# Patient Record
Sex: Female | Born: 1950 | Race: White | Hispanic: No | Marital: Married | Smoking: Current every day smoker
Health system: Southern US, Community
[De-identification: ages and names within clinical notes are randomized; demographics above are authoritative.]

## PROBLEM LIST (undated history)

## (undated) DIAGNOSIS — K7460 Unspecified cirrhosis of liver: Secondary | ICD-10-CM

## (undated) DIAGNOSIS — F419 Anxiety disorder, unspecified: Secondary | ICD-10-CM

## (undated) DIAGNOSIS — N390 Urinary tract infection, site not specified: Secondary | ICD-10-CM

## (undated) DIAGNOSIS — E119 Type 2 diabetes mellitus without complications: Secondary | ICD-10-CM

## (undated) DIAGNOSIS — F329 Major depressive disorder, single episode, unspecified: Secondary | ICD-10-CM

## (undated) DIAGNOSIS — R918 Other nonspecific abnormal finding of lung field: Secondary | ICD-10-CM

## (undated) DIAGNOSIS — M810 Age-related osteoporosis without current pathological fracture: Secondary | ICD-10-CM

## (undated) DIAGNOSIS — Z8781 Personal history of (healed) traumatic fracture: Secondary | ICD-10-CM

## (undated) DIAGNOSIS — I639 Cerebral infarction, unspecified: Secondary | ICD-10-CM

## (undated) DIAGNOSIS — D126 Benign neoplasm of colon, unspecified: Secondary | ICD-10-CM

## (undated) DIAGNOSIS — Z8673 Personal history of transient ischemic attack (TIA), and cerebral infarction without residual deficits: Secondary | ICD-10-CM

## (undated) DIAGNOSIS — E785 Hyperlipidemia, unspecified: Secondary | ICD-10-CM

## (undated) DIAGNOSIS — Z972 Presence of dental prosthetic device (complete) (partial): Secondary | ICD-10-CM

## (undated) DIAGNOSIS — I10 Essential (primary) hypertension: Secondary | ICD-10-CM

## (undated) DIAGNOSIS — M1990 Unspecified osteoarthritis, unspecified site: Secondary | ICD-10-CM

## (undated) DIAGNOSIS — K2970 Gastritis, unspecified, without bleeding: Secondary | ICD-10-CM

## (undated) DIAGNOSIS — I2510 Atherosclerotic heart disease of native coronary artery without angina pectoris: Secondary | ICD-10-CM

## (undated) DIAGNOSIS — F32A Depression, unspecified: Secondary | ICD-10-CM

## (undated) DIAGNOSIS — J449 Chronic obstructive pulmonary disease, unspecified: Secondary | ICD-10-CM

## (undated) DIAGNOSIS — K635 Polyp of colon: Secondary | ICD-10-CM

## (undated) HISTORY — DX: Cerebral infarction, unspecified: I63.9

## (undated) HISTORY — DX: Anxiety disorder, unspecified: F41.9

## (undated) HISTORY — DX: Benign neoplasm of colon, unspecified: D12.6

## (undated) HISTORY — DX: Atherosclerotic heart disease of native coronary artery without angina pectoris: I25.10

## (undated) HISTORY — DX: Polyp of colon: K63.5

## (undated) HISTORY — DX: Age-related osteoporosis without current pathological fracture: M81.0

## (undated) HISTORY — DX: Gastritis, unspecified, without bleeding: K29.70

## (undated) HISTORY — DX: Hyperlipidemia, unspecified: E78.5

## (undated) HISTORY — PX: VAGINAL HYSTERECTOMY: SUR661

## (undated) HISTORY — DX: Unspecified cirrhosis of liver: K74.60

## (undated) HISTORY — DX: Essential (primary) hypertension: I10

---

## 1995-07-28 HISTORY — PX: BILATERAL SALPINGOOPHORECTOMY: SHX1223

## 1998-03-27 ENCOUNTER — Other Ambulatory Visit: Admission: RE | Admit: 1998-03-27 | Discharge: 1998-03-27 | Payer: Self-pay | Admitting: Obstetrics and Gynecology

## 1998-04-02 ENCOUNTER — Ambulatory Visit (HOSPITAL_COMMUNITY): Admission: RE | Admit: 1998-04-02 | Discharge: 1998-04-02 | Payer: Self-pay | Admitting: Obstetrics and Gynecology

## 1998-05-07 ENCOUNTER — Observation Stay (HOSPITAL_COMMUNITY): Admission: RE | Admit: 1998-05-07 | Discharge: 1998-05-08 | Payer: Self-pay | Admitting: Obstetrics and Gynecology

## 2001-07-22 ENCOUNTER — Other Ambulatory Visit: Admission: RE | Admit: 2001-07-22 | Discharge: 2001-07-22 | Payer: Self-pay | Admitting: Obstetrics and Gynecology

## 2002-06-28 ENCOUNTER — Encounter: Payer: Self-pay | Admitting: Internal Medicine

## 2002-06-28 ENCOUNTER — Ambulatory Visit (HOSPITAL_COMMUNITY): Admission: RE | Admit: 2002-06-28 | Discharge: 2002-06-28 | Payer: Self-pay | Admitting: Internal Medicine

## 2003-08-08 ENCOUNTER — Other Ambulatory Visit: Admission: RE | Admit: 2003-08-08 | Discharge: 2003-08-08 | Payer: Self-pay | Admitting: Obstetrics and Gynecology

## 2003-08-21 ENCOUNTER — Encounter: Admission: RE | Admit: 2003-08-21 | Discharge: 2003-08-21 | Payer: Self-pay | Admitting: General Surgery

## 2005-09-29 ENCOUNTER — Encounter: Admission: RE | Admit: 2005-09-29 | Discharge: 2005-09-29 | Payer: Self-pay | Admitting: Internal Medicine

## 2005-11-25 ENCOUNTER — Encounter: Payer: Self-pay | Admitting: Obstetrics and Gynecology

## 2012-01-06 ENCOUNTER — Ambulatory Visit (HOSPITAL_COMMUNITY)
Admission: RE | Admit: 2012-01-06 | Discharge: 2012-01-06 | Disposition: A | Discharge status: Home / Self Care | Payer: Self-pay | Source: Ambulatory Visit | Attending: Internal Medicine | Admitting: Internal Medicine

## 2012-01-06 ENCOUNTER — Other Ambulatory Visit (HOSPITAL_COMMUNITY): Payer: Self-pay | Admitting: Internal Medicine

## 2012-01-06 DIAGNOSIS — F17200 Nicotine dependence, unspecified, uncomplicated: Secondary | ICD-10-CM | POA: Insufficient documentation

## 2012-01-06 DIAGNOSIS — R059 Cough, unspecified: Secondary | ICD-10-CM | POA: Insufficient documentation

## 2012-01-06 DIAGNOSIS — R5383 Other fatigue: Secondary | ICD-10-CM | POA: Insufficient documentation

## 2012-01-06 DIAGNOSIS — R5381 Other malaise: Secondary | ICD-10-CM | POA: Insufficient documentation

## 2012-01-06 DIAGNOSIS — R05 Cough: Secondary | ICD-10-CM | POA: Insufficient documentation

## 2013-06-19 ENCOUNTER — Encounter: Payer: Self-pay | Admitting: Internal Medicine

## 2013-06-19 DIAGNOSIS — E119 Type 2 diabetes mellitus without complications: Secondary | ICD-10-CM

## 2013-06-19 DIAGNOSIS — K760 Fatty (change of) liver, not elsewhere classified: Secondary | ICD-10-CM | POA: Insufficient documentation

## 2013-06-19 DIAGNOSIS — E559 Vitamin D deficiency, unspecified: Secondary | ICD-10-CM | POA: Insufficient documentation

## 2013-06-19 DIAGNOSIS — E785 Hyperlipidemia, unspecified: Secondary | ICD-10-CM

## 2013-06-19 DIAGNOSIS — E1169 Type 2 diabetes mellitus with other specified complication: Secondary | ICD-10-CM | POA: Insufficient documentation

## 2013-06-19 DIAGNOSIS — F419 Anxiety disorder, unspecified: Secondary | ICD-10-CM | POA: Insufficient documentation

## 2013-06-19 DIAGNOSIS — I10 Essential (primary) hypertension: Secondary | ICD-10-CM

## 2013-06-19 LAB — HM COLONOSCOPY

## 2013-06-20 ENCOUNTER — Encounter: Payer: Self-pay | Admitting: Emergency Medicine

## 2013-06-20 ENCOUNTER — Ambulatory Visit (INDEPENDENT_AMBULATORY_CARE_PROVIDER_SITE_OTHER): Payer: Self-pay | Admitting: Emergency Medicine

## 2013-06-20 VITALS — BP 122/70 | HR 78 | Temp 98.0°F | Resp 18 | Ht <= 58 in | Wt 127.0 lb

## 2013-06-20 DIAGNOSIS — E871 Hypo-osmolality and hyponatremia: Secondary | ICD-10-CM

## 2013-06-20 MED ORDER — CYCLOBENZAPRINE HCL 10 MG PO TABS: 10.0000 mg | ORAL_TABLET | Freq: Three times a day (TID) | ORAL | Status: DC | PRN

## 2013-06-20 MED ORDER — ALPRAZOLAM 1 MG PO TABS: 1.0000 mg | ORAL_TABLET | Freq: Three times a day (TID) | ORAL | Status: DC | PRN

## 2013-06-20 NOTE — Patient Instructions (Signed)
Hyponatremia  Hyponatremia is when the salt (sodium) in your blood is low. When salt becomes low, your cells take in extra water and puff up (swell). The puffiness can happen in the whole body. It mostly affects the brain and is very serious.  HOME CARE  Only take medicine as told by your doctor.  Follow any diet instructions you were given. This includes limiting how much fluid you drink.  Keep all doctor visits for tests as told.  Avoid alcohol and drugs. GET HELP RIGHT AWAY IF:  You start to twitch and shake (seize).  You pass out (faint).  You continue to have watery poop (diarrhea) or you throw up (vomit).  You feel sick to your stomach (nauseous).  You are tired (fatigued), have a headache, are confused, or feel weak.  Your problems that first brought you to the doctor come back.  You have trouble following your diet instructions. MAKE SURE YOU:   Understand these instructions.  Will watch your condition.  Will get help right away if you are not doing well or get worse. Document Released: 03/25/2011 Document Revised: 10/05/2011 Document Reviewed: 03/25/2011 [facility] Patient Information 2014 [place], [place].

## 2013-06-21 ENCOUNTER — Other Ambulatory Visit: Payer: Self-pay | Admitting: Emergency Medicine

## 2013-06-21 LAB — BASIC METABOLIC PANEL WITH GFR
BUN: 18 mg/dL (ref 6–23)
Creat: 0.72 mg/dL (ref 0.50–1.10)

## 2013-06-21 MED ORDER — POTASSIUM CHLORIDE ER 10 MEQ PO TBCR: 10.0000 meq | EXTENDED_RELEASE_TABLET | Freq: Two times a day (BID) | ORAL | Status: DC

## 2013-06-21 MED ORDER — LISINOPRIL 20 MG PO TABS: 20.0000 mg | ORAL_TABLET | Freq: Every day | ORAL | Status: DC

## 2013-06-21 NOTE — Progress Notes (Signed)
  Subjective:    Patient ID: Sara Lamb, female    DOB: 11-Dec-1950, 62 y.o.   MRN: [id number]  HPI Comments: 62 yo noncompliant female for recheck BMP. Has been advised numerous times to stop HCTZ with low sodium/ chloride. She restarted dur to elevated BP and edema. She does not eat healthy and "does not tolerate RX DM meds". She is aware of risks with noncompliance and of need for further evaluation.   Current Outpatient Prescriptions on File Prior to Visit  Medication Sig Dispense Refill  . Multiple Vitamins-Minerals (STRESS TAB NF PO) Take by mouth.       No current facility-administered medications on file prior to visit.   ALLERGIES Metformin and related; Insulins; and Penicillins  Past Medical History  Diagnosis Date  . Diabetes mellitus without complication   . Anxiety   . Hypertension   . Nonalcoholic hepatosteatosis   . Unspecified vitamin D deficiency   . Hyperlipidemia     Review of Systems  All other systems reviewed and are negative.   BP 122/70  Pulse 78  Temp(Src) 98 F (36.7 C) (Temporal)  Resp 18  Ht 4' 10"  (1.473 m)  Wt 127 lb (57.607 kg)  BMI 26.55 kg/m2     Objective:   Physical Exam  Nursing note and vitals reviewed. Constitutional: She is oriented to person, place, and time. She appears well-developed and well-nourished.  Neck: Normal range of motion. No thyromegaly present.  Cardiovascular: Normal rate, regular rhythm, normal heart sounds and intact distal pulses.   Pulmonary/Chest: Effort normal and breath sounds normal.  Abdominal: Soft. Bowel sounds are normal.  Musculoskeletal: Normal range of motion.  Lymphadenopathy:    She has no cervical adenopathy.  Neurological: She is alert and oriented to person, place, and time.  Skin: Skin is warm and dry.  Psychiatric: She has a normal mood and affect.          Assessment & Plan:  Noncompliant DM, recurrent hyponatremia with noncompliance with instructions. Check lab. Needs further  eval but wants to wait due to cost.

## 2013-07-03 ENCOUNTER — Encounter: Payer: Self-pay | Admitting: Physician Assistant

## 2013-07-03 ENCOUNTER — Ambulatory Visit (INDEPENDENT_AMBULATORY_CARE_PROVIDER_SITE_OTHER): Payer: Self-pay | Admitting: Physician Assistant

## 2013-07-03 VITALS — BP 110/68 | HR 84 | Temp 98.6°F | Resp 16 | Ht <= 58 in | Wt 129.0 lb

## 2013-07-03 DIAGNOSIS — J209 Acute bronchitis, unspecified: Secondary | ICD-10-CM

## 2013-07-03 MED ORDER — PREDNISONE 20 MG PO TABS: ORAL_TABLET | ORAL | Status: DC

## 2013-07-03 MED ORDER — AZITHROMYCIN 250 MG PO TABS: ORAL_TABLET | ORAL | Status: DC

## 2013-07-03 NOTE — Progress Notes (Signed)
   Subjective:    Patient ID: Sara Lamb, female    DOB: 27-Aug-1950, 62 y.o.   MRN: [id number]  Cough This is a new problem. The current episode started in the past 7 days. The problem has been gradually worsening. The problem occurs constantly. The cough is productive of sputum. Associated symptoms include chills, a fever, myalgias and a sore throat. Pertinent negatives include no chest pain, ear congestion, ear pain, headaches, heartburn, hemoptysis, nasal congestion, postnasal drip, rash, rhinorrhea, shortness of breath, sweats, weight loss or wheezing.   Current Outpatient Prescriptions on File Prior to Visit  Medication Sig Dispense Refill  . ALPRAZolam (XANAX) 1 MG tablet Take 1 tablet (1 mg total) by mouth 3 (three) times daily as needed for anxiety.  90 tablet  0  . lisinopril (PRINIVIL,ZESTRIL) 20 MG tablet Take 1 tablet (20 mg total) by mouth daily.  90 tablet  2  . Multiple Vitamins-Minerals (STRESS TAB NF PO) Take by mouth.      Marland Kitchen OVER THE COUNTER MEDICATION daily. Diaplex (for BS) one to two pills daily       No current facility-administered medications on file prior to visit.   Past Medical History  Diagnosis Date  . Diabetes mellitus without complication   . Anxiety   . Hypertension   . Nonalcoholic hepatosteatosis   . Unspecified vitamin D deficiency   . Hyperlipidemia     Review of Systems  Constitutional: Positive for fever and chills. Negative for weight loss and fatigue.  HENT: Positive for sore throat. Negative for ear pain, postnasal drip and rhinorrhea.   Eyes: Negative.   Respiratory: Positive for cough and chest tightness. Negative for hemoptysis, shortness of breath and wheezing.   Cardiovascular: Negative.  Negative for chest pain.  Gastrointestinal: Negative for heartburn.  Musculoskeletal: Positive for myalgias.  Skin: Negative for rash.  Neurological: Negative for headaches.       Objective:   Physical Exam  Constitutional: She is oriented to  person, place, and time. She appears well-developed and well-nourished.  HENT:  Head: Normocephalic and atraumatic.  Right Ear: External ear normal.  Left Ear: External ear normal.  Nose: Nose normal.  Mouth/Throat: Oropharynx is clear and moist.  Eyes: Conjunctivae are normal. Pupils are equal, round, and reactive to light.  Neck: Normal range of motion. Neck supple.  Cardiovascular: Normal rate and regular rhythm.   Pulmonary/Chest: Effort normal. No respiratory distress. She has wheezes. She has no rales. She exhibits no tenderness.  Abdominal: Soft. Bowel sounds are normal.  Lymphadenopathy:    She has no cervical adenopathy.  Neurological: She is alert and oriented to person, place, and time.  Skin: Skin is warm and dry.      Assessment & Plan:  Acute bronchitis - Plan: azithromycin (ZITHROMAX) 250 MG tablet, predniSONE (DELTASONE) 20 MG tablet

## 2013-07-10 ENCOUNTER — Other Ambulatory Visit: Payer: Self-pay | Admitting: Physician Assistant

## 2013-07-10 MED ORDER — PROMETHAZINE-CODEINE 6.25-10 MG/5ML PO SYRP
5.0000 mL | ORAL_SOLUTION | Freq: Four times a day (QID) | ORAL | Status: DC | PRN
Start: 2013-07-10 — End: 2013-10-25

## 2013-08-23 ENCOUNTER — Other Ambulatory Visit: Payer: Self-pay | Admitting: Internal Medicine

## 2013-08-23 DIAGNOSIS — F411 Generalized anxiety disorder: Secondary | ICD-10-CM

## 2013-08-23 MED ORDER — ALPRAZOLAM 1 MG PO TABS: ORAL_TABLET | ORAL | Status: DC

## 2013-09-21 ENCOUNTER — Ambulatory Visit: Payer: Self-pay | Admitting: Physician Assistant

## 2013-09-26 ENCOUNTER — Ambulatory Visit (INDEPENDENT_AMBULATORY_CARE_PROVIDER_SITE_OTHER): Payer: BC Managed Care – PPO | Admitting: Physician Assistant

## 2013-09-26 ENCOUNTER — Encounter: Payer: Self-pay | Admitting: Physician Assistant

## 2013-09-26 VITALS — BP 100/60 | HR 88 | Temp 98.5°F | Resp 16 | Ht <= 58 in | Wt 128.0 lb

## 2013-09-26 DIAGNOSIS — E559 Vitamin D deficiency, unspecified: Secondary | ICD-10-CM

## 2013-09-26 DIAGNOSIS — K760 Fatty (change of) liver, not elsewhere classified: Secondary | ICD-10-CM

## 2013-09-26 DIAGNOSIS — E785 Hyperlipidemia, unspecified: Secondary | ICD-10-CM

## 2013-09-26 DIAGNOSIS — Z79899 Other long term (current) drug therapy: Secondary | ICD-10-CM

## 2013-09-26 DIAGNOSIS — I10 Essential (primary) hypertension: Secondary | ICD-10-CM

## 2013-09-26 DIAGNOSIS — K7689 Other specified diseases of liver: Secondary | ICD-10-CM

## 2013-09-26 DIAGNOSIS — E782 Mixed hyperlipidemia: Secondary | ICD-10-CM

## 2013-09-26 DIAGNOSIS — E119 Type 2 diabetes mellitus without complications: Secondary | ICD-10-CM

## 2013-09-26 LAB — CBC WITH DIFFERENTIAL/PLATELET
Basophils Absolute: 0.1 10*3/uL (ref 0.0–0.1)
Basophils Relative: 1 % (ref 0–1)
EOS ABS: 0.3 10*3/uL (ref 0.0–0.7)
Eosinophils Relative: 3 % (ref 0–5)
HCT: 47.6 % — ABNORMAL HIGH (ref 36.0–46.0)
HEMOGLOBIN: 16.4 g/dL — AB (ref 12.0–15.0)
Lymphocytes Relative: 32 % (ref 12–46)
Lymphs Abs: 3 10*3/uL (ref 0.7–4.0)
MCH: 30 pg (ref 26.0–34.0)
MCHC: 34.5 g/dL (ref 30.0–36.0)
MCV: 87.2 fL (ref 78.0–100.0)
MONO ABS: 0.5 10*3/uL (ref 0.1–1.0)
Monocytes Relative: 5 % (ref 3–12)
Neutro Abs: 5.5 10*3/uL (ref 1.7–7.7)
Neutrophils Relative %: 59 % (ref 43–77)
Platelets: 302 10*3/uL (ref 150–400)
RBC: 5.46 MIL/uL — ABNORMAL HIGH (ref 3.87–5.11)
RDW: 13 % (ref 11.5–15.5)
WBC: 9.4 10*3/uL (ref 4.0–10.5)

## 2013-09-26 MED ORDER — PIOGLITAZONE HCL 15 MG PO TABS: 15.0000 mg | ORAL_TABLET | Freq: Every day | ORAL | Status: DC

## 2013-09-26 NOTE — Patient Instructions (Signed)
   Bad carbs also include fruit juice, alcohol, and sweet tea. These are empty calories that do not signal to your brain that you are full.   Please remember the good carbs are still carbs which convert into sugar. So please measure them out no more than 1/2-1 cup of rice, oatmeal, pasta, and beans.  Veggies are however free foods! Pile them on.   I like lean protein at every meal such as chicken, [place], pork chops, cottage cheese, etc. Just do not fry these meats and please center your meal around vegetable, the meats should be a side dish.   No all fruit is created equal. Please see the list below, the fruit at the bottom is higher in sugars than the fruit at the top

## 2013-09-26 NOTE — Progress Notes (Signed)
HPI 63 y.o. female  presents for 3 month follow up with hypertension, hyperlipidemia, diabetes and vitamin D. Her blood pressure has been controlled at home, today their BP is BP: 100/60 mmHg She does not workout. She denies chest pain, shortness of breath, dizziness.  She is not on cholesterol medication and denies myalgias. Her cholesterol is not at goal. The cholesterol last visit was: LDL 143 She has not been working on diet and exercise for Diabetes, and denies foot ulcerations, increased appetite, nausea and visual disturbances. Last A1C in the office was: 13.1, patient states she has a reaction to every DM medication but Actos. She is very noncompliant and has poor insight.  Patient is on Vitamin D supplement.   Hyponatremia was 130 (129) , chloride 83, CO2 34, glucose 283 in 05/24/2013. Patient complains of breaking out into a sweat half way through eating food for the past 2 months. She denies cramping, diarrhea, constipation, does get full quickly.  She states she had a mini stroke 2 months ago, pre her own assessment. She has had an intention tremor for 2-3 months. Xanax helps   Current Medications:  Current Outpatient Prescriptions on File Prior to Visit  Medication Sig Dispense Refill  . ALPRAZolam (XANAX) 1 MG tablet 1/2 to 1 tabret 2 to 3 x daily as needed for nerves  90 tablet  2  . lisinopril (PRINIVIL,ZESTRIL) 20 MG tablet Take 1 tablet (20 mg total) by mouth daily.  90 tablet  2  . Multiple Vitamins-Minerals (STRESS TAB NF PO) Take by mouth.      Marland Kitchen OVER THE COUNTER MEDICATION daily. Diaplex (for BS) one to two pills daily      . promethazine-codeine (PHENERGAN WITH CODEINE) 6.25-10 MG/5ML syrup Take 5 mLs by mouth every 6 (six) hours as needed for cough.  240 mL  0   No current facility-administered medications on file prior to visit.   Medical History:  Past Medical History  Diagnosis Date  . Diabetes mellitus without complication   . Anxiety   . Hypertension   .  Nonalcoholic hepatosteatosis   . Unspecified vitamin D deficiency   . Hyperlipidemia    Allergies:  Allergies  Allergen Reactions  . Metformin And Related     ELEVATED BS. GLYBURIDE, ONGLYZA, ACTOS, INVOKANA  . Insulins Rash  . Penicillins Rash     Review of Systems: [X]  = complains of  [ ]  = denies  General: Fatigue [ ]  Fever [ ]  Chills [ ]  Weakness [ ]   Insomnia [ ]  Eyes: Redness [ ]  Blurred vision [ ]  Diplopia [ ]   ENT: Congestion [ ]  Sinus Pain [ ]  Post Nasal Drip [ ]  Sore Throat [ ]  Earache [ ]   Cardiac: Chest pain/pressure [ ]  SOB [ ]  Orthopnea [ ]   Palpitations [ ]   Paroxysmal nocturnal dyspnea[ ]  Claudication [ ]  Edema [ ]   Pulmonary: Cough [ ]  Wheezing[ ]   SOB [ ]   Snoring [ ]   GI: Nausea [ ]  Vomiting[ ]  Dysphagia[ ]  Heartburn[ ]  Abdominal pain [ ]  Constipation [ ] ; Diarrhea [ ] ; BRBPR [ ]  Melena[ ]  GU: Hematuria[ ]  Dysuria [ ]  Nocturia[ ]  Urgency [ ]   Hesitancy [ ]  Discharge [ ]  Neuro: Headaches[ ]  Vertigo[ ]  Paresthesias[ ]  Spasm [ ]  Speech changes [ ]  Incoordination [ ]   Ortho: Arthritis [ ]  Joint pain [ ]  Muscle pain [ ]  Joint swelling [ ]  Back Pain [ ]  Skin:  Rash [ ]   Pruritis [ ]   Change in skin lesion [ ]   Psych: Depression[ ]  Anxiety[ ]  Confusion [ ]  Memory loss [ ]   Heme/Lypmh: Bleeding [ ]  Bruising [ ]  Enlarged lymph nodes [ ]   Endocrine: Visual blurring [ ]  Paresthesia [ ]  Polyuria [ ]  Polydypsea [ ]    Heat/cold intolerance [ ]  Hypoglycemia [ ]   Family history- Review and unchanged Social history- Review and unchanged Physical Exam: Filed Vitals:   09/26/13 1458  BP: 100/60  Pulse: 88  Temp: 98.5 F (36.9 C)  Resp: 16   Wt Readings from Last 3 Encounters:  09/26/13 128 lb (58.06 kg)  07/03/13 129 lb (58.514 kg)  06/20/13 127 lb (57.607 kg)   General Appearance: Well nourished, in no apparent distress. Eyes: PERRLA, EOMs, conjunctiva no swelling or erythema Sinuses: No Frontal/maxillary tenderness ENT/Mouth: Ext aud canals clear, TMs without  erythema, bulging. No erythema, swelling, or exudate on post pharynx.  Tonsils not swollen or erythematous. Hearing normal.  Neck: Supple, thyroid normal.  Respiratory: Respiratory effort normal, BS equal bilaterally without rales, rhonchi, wheezing or stridor.  Cardio: RRR with no MRGs. Brisk peripheral pulses without edema.  Abdomen: Soft, + BS.  Non tender, no guarding, rebound, hernias, masses. Lymphatics: Non tender without lymphadenopathy.  Musculoskeletal: Full ROM, 5/5 strength, normal gait.  Skin: Warm, dry without rashes, lesions, ecchymosis.  Neuro: Cranial nerves intact. No cerebellar symptoms. Sensation intact.  Psych: Awake and oriented X 3, normal affect, Insight and Judgment appropriate.   Assessment and Plan:  Hypertension: Continue medication, monitor blood pressure at home. Continue DASH diet. Cholesterol: Continue diet and exercise. Check cholesterol.  Diabetes-Continue diet and exercise. Check A1C. After a very very long discussion with the patient, she is very noncompliant and declines almost every diabetic medication but actos, i have explained the weight, edema, and possible cancers associated with Actos, she understands the risk and still will only take that medicaiton and with all of her risk facotrs it is improtant to have her on something.  Vitamin D Def- check level and continue medications.  Smoking cessation- discussed not ready, and states " she will until she goes to her grave" Diaphoresis with eating- for 30 mins, no accompaniments Intention tremor? From electrolytes verus medications.   Continue diet and meds as discussed. Further disposition pending results of labs. Discussed med's effects and SE's.    Vicie Mutters 3:14 PM

## 2013-09-27 LAB — HEPATIC FUNCTION PANEL
ALK PHOS: 137 U/L — AB (ref 39–117)
ALT: 32 U/L (ref 0–35)
AST: 25 U/L (ref 0–37)
Albumin: 4.4 g/dL (ref 3.5–5.2)
Bilirubin, Direct: 0.1 mg/dL (ref 0.0–0.3)
Indirect Bilirubin: 0.5 mg/dL (ref 0.2–1.2)
Total Bilirubin: 0.6 mg/dL (ref 0.2–1.2)
Total Protein: 7.8 g/dL (ref 6.0–8.3)

## 2013-09-27 LAB — LIPID PANEL
CHOL/HDL RATIO: 5.8 ratio
CHOLESTEROL: 254 mg/dL — AB (ref 0–200)
HDL: 44 mg/dL (ref >39)
LDL Cholesterol: 165 mg/dL — ABNORMAL HIGH (ref 0–99)
TRIGLYCERIDES: 226 mg/dL — AB (ref <150)
VLDL: 45 mg/dL — AB (ref 0–40)

## 2013-09-27 LAB — BASIC METABOLIC PANEL WITH GFR
BUN: 10 mg/dL (ref 6–23)
CO2: 28 mEq/L (ref 19–32)
CREATININE: 0.93 mg/dL (ref 0.50–1.10)
Calcium: 9.9 mg/dL (ref 8.4–10.5)
Chloride: 92 mEq/L — ABNORMAL LOW (ref 96–112)
GFR, EST AFRICAN AMERICAN: 76 mL/min
GFR, EST NON AFRICAN AMERICAN: 66 mL/min
Glucose, Bld: 266 mg/dL — ABNORMAL HIGH (ref 70–99)
Potassium: 4.2 mEq/L (ref 3.5–5.3)
SODIUM: 130 meq/L — AB (ref 135–145)

## 2013-09-27 LAB — TSH: TSH: 0.812 u[IU]/mL (ref 0.350–4.500)

## 2013-09-27 LAB — HEMOGLOBIN A1C
Hgb A1c MFr Bld: 12.2 % — ABNORMAL HIGH (ref <5.7)
Mean Plasma Glucose: 303 mg/dL — ABNORMAL HIGH (ref <117)

## 2013-09-27 LAB — MAGNESIUM: MAGNESIUM: 1.7 mg/dL (ref 1.5–2.5)

## 2013-10-25 ENCOUNTER — Encounter: Payer: Self-pay | Admitting: Physician Assistant

## 2013-10-25 ENCOUNTER — Ambulatory Visit (INDEPENDENT_AMBULATORY_CARE_PROVIDER_SITE_OTHER): Payer: BC Managed Care – PPO | Admitting: Physician Assistant

## 2013-10-25 VITALS — BP 118/60 | HR 68 | Temp 98.1°F | Resp 16 | Ht <= 58 in | Wt 131.0 lb

## 2013-10-25 DIAGNOSIS — E871 Hypo-osmolality and hyponatremia: Secondary | ICD-10-CM

## 2013-10-25 DIAGNOSIS — R748 Abnormal levels of other serum enzymes: Secondary | ICD-10-CM

## 2013-10-25 DIAGNOSIS — I10 Essential (primary) hypertension: Secondary | ICD-10-CM

## 2013-10-25 LAB — CBC WITH DIFFERENTIAL/PLATELET
BASOS ABS: 0.1 10*3/uL (ref 0.0–0.1)
BASOS PCT: 1 % (ref 0–1)
Eosinophils Absolute: 0.2 10*3/uL (ref 0.0–0.7)
Eosinophils Relative: 3 % (ref 0–5)
HCT: 43.4 % (ref 36.0–46.0)
Hemoglobin: 14.8 g/dL (ref 12.0–15.0)
LYMPHS PCT: 30 % (ref 12–46)
Lymphs Abs: 2.4 10*3/uL (ref 0.7–4.0)
MCH: 30.1 pg (ref 26.0–34.0)
MCHC: 34.1 g/dL (ref 30.0–36.0)
MCV: 88.2 fL (ref 78.0–100.0)
Monocytes Absolute: 0.8 10*3/uL (ref 0.1–1.0)
Monocytes Relative: 10 % (ref 3–12)
Neutro Abs: 4.5 10*3/uL (ref 1.7–7.7)
Neutrophils Relative %: 56 % (ref 43–77)
Platelets: 245 10*3/uL (ref 150–400)
RBC: 4.92 MIL/uL (ref 3.87–5.11)
RDW: 12.7 % (ref 11.5–15.5)
WBC: 8 10*3/uL (ref 4.0–10.5)

## 2013-10-25 LAB — HEPATIC FUNCTION PANEL
ALK PHOS: 121 U/L — AB (ref 39–117)
ALT: 23 U/L (ref 0–35)
AST: 20 U/L (ref 0–37)
Albumin: 4.1 g/dL (ref 3.5–5.2)
BILIRUBIN DIRECT: 0.1 mg/dL (ref 0.0–0.3)
BILIRUBIN INDIRECT: 0.3 mg/dL (ref 0.2–1.2)
BILIRUBIN TOTAL: 0.4 mg/dL (ref 0.2–1.2)
TOTAL PROTEIN: 7.3 g/dL (ref 6.0–8.3)

## 2013-10-25 LAB — BASIC METABOLIC PANEL WITH GFR
BUN: 8 mg/dL (ref 6–23)
CO2: 31 mEq/L (ref 19–32)
CREATININE: 0.65 mg/dL (ref 0.50–1.10)
Calcium: 9.5 mg/dL (ref 8.4–10.5)
Chloride: 93 mEq/L — ABNORMAL LOW (ref 96–112)
GFR, Est African American: >89 mL/min
Glucose, Bld: 319 mg/dL — ABNORMAL HIGH (ref 70–99)
Potassium: 4.2 mEq/L (ref 3.5–5.3)
Sodium: 130 mEq/L — ABNORMAL LOW (ref 135–145)

## 2013-10-25 MED ORDER — PIOGLITAZONE HCL 30 MG PO TABS: 30.0000 mg | ORAL_TABLET | Freq: Every day | ORAL | Status: DC

## 2013-10-25 NOTE — Progress Notes (Signed)
HPI 63 y.o.female presents for 1 month follow up. Last visit we had a very long, serious talk about her noncompliance and the risk of MI, Stroke, and death. She understands but she does not want to quit smoking, refuses cholesterol medication. She states she did try the metamucil but it increased her sugar. It has been running from 300's and now on 200's since being on Actos 69m. She will not take any other DM medication. She states the tremor with food has gotten better and does not want further testing with this.   Lab Results  Component Value Date   CREATININE 0.93 09/26/2013   BUN 10 09/26/2013   NA 130* 09/26/2013   K 4.2 09/26/2013   CL 92* 09/26/2013   CO2 28 09/26/2013   Lab Results  Component Value Date   ALT 32 09/26/2013   AST 25 09/26/2013   ALKPHOS 137* 09/26/2013   BILITOT 0.6 09/26/2013   Lab Results  Component Value Date   HGBA1C 12.2* 09/26/2013    Past Medical History  Diagnosis Date  . Diabetes mellitus without complication   . Anxiety   . Hypertension   . Nonalcoholic hepatosteatosis   . Unspecified vitamin D deficiency   . Hyperlipidemia   . Atherosclerosis of aorta     via CXR 2013     Allergies  Allergen Reactions  . Metformin And Related     ELEVATED BS. GLYBURIDE, ONGLYZA, ACTOS, INVOKANA  . Insulins Rash  . Penicillins Rash      Current Outpatient Prescriptions on File Prior to Visit  Medication Sig Dispense Refill  . ALPRAZolam (XANAX) 1 MG tablet 1/2 to 1 tabret 2 to 3 x daily as needed for nerves  90 tablet  2  . lisinopril (PRINIVIL,ZESTRIL) 20 MG tablet Take 1 tablet (20 mg total) by mouth daily.  90 tablet  2  . Multiple Vitamins-Minerals (STRESS TAB NF PO) Take by mouth.      .Marland KitchenOVER THE COUNTER MEDICATION daily. Diaplex (for BS) one to two pills daily      . pioglitazone (ACTOS) 15 MG tablet Take 1 tablet (15 mg total) by mouth daily.  30 tablet  11   No current facility-administered medications on file prior to visit.    ROS: all negative expect  above.   Physical: Filed Weights   10/25/13 1434  Weight: 131 lb (59.421 kg)   BP 118/60  Pulse 68  Temp(Src) 98.1 F (36.7 C)  Resp 16  Ht 4' 10" (1.473 m)  Wt 131 lb (59.421 kg)  BMI 27.39 kg/m2 General appearance: alert and no distress Eyes: conjunctivae/corneas clear. PERRL, EOM's intact. Fundi benign. Ears: normal TM's and external ear canals both ears Throat: lips, mucosa, and tongue normal; teeth and gums normal Neck: no adenopathy, no carotid bruit, no JVD, supple, symmetrical, trachea midline and thyroid not enlarged, symmetric, no tenderness/mass/nodules Lungs: clear to auscultation bilaterally Heart: regular rate and rhythm, S1, S2 normal, no murmur, click, rub or gallop Abdomen: soft, non-tender; bowel sounds normal; no masses,  no organomegaly Extremities: no edema, redness or tenderness in the calves or thighs Neurologic: Cranial nerves: normal Motor: grossly normal Gait: Drop-foot   Assessment and Plan: DM- long discussion about compliance. She is very very high risk for stroke, MI and death with her cholesterol and sugars. We will increase her actos to 30 and see her back in 2 months for an A1C .  Hyponatremia- recheck BMP Elevated Alk Phos- no AB pain.

## 2013-10-25 NOTE — Patient Instructions (Signed)
Bad carbs also include fruit juice, alcohol, and sweet tea. These are empty calories that do not signal to your brain that you are full.   Please remember the good carbs are still carbs which convert into sugar. So please measure them out no more than 1/2-1 cup of rice, oatmeal, pasta, and beans.  Veggies are however free foods! Pile them on.   I like lean protein at every meal such as chicken, [place], pork chops, cottage cheese, etc. Just do not fry these meats and please center your meal around vegetable, the meats should be a side dish.   No all fruit is created equal. Please see the list below, the fruit at the bottom is higher in sugars than the fruit at the top   Diabetes Meal Planning Guide The diabetes meal planning guide is a tool to help you plan your meals and snacks. It is important for people with diabetes to manage their blood glucose (sugar) levels. Choosing the right foods and the right amounts throughout your day will help control your blood glucose. Eating right can even help you improve your blood pressure and reach or maintain a healthy weight. CARBOHYDRATE COUNTING MADE EASY When you eat carbohydrates, they turn to sugar. This raises your blood glucose level. Counting carbohydrates can help you control this level so you feel better. When you plan your meals by counting carbohydrates, you can have more flexibility in what you eat and balance your medicine with your food intake. Carbohydrate counting simply means adding up the total amount of carbohydrate grams in your meals and snacks. Try to eat about the same amount at each meal. Foods with carbohydrates are listed below. Each portion below is 1 carbohydrate serving or 15 grams of carbohydrates. Ask your dietician how many grams of carbohydrates you should eat at each meal or snack. Grains and Starches  1 slice bread.   English muffin or hotdog/hamburger bun.   cup cold cereal (unsweetened).   cup cooked pasta or  rice.   cup starchy vegetables (corn, potatoes, peas, beans, winter squash).  1 tortilla (6 inches).   bagel.  1 waffle or pancake (size of a CD).   cup cooked cereal.  4 to 6 small crackers. *Whole grain is recommended. Fruit  1 cup fresh unsweetened berries, melon, papaya, pineapple.  1 small fresh fruit.   banana or mango.   cup fruit juice (4 oz unsweetened).   cup canned fruit in natural juice or water.  2 tbs dried fruit.  12 to 15 grapes or cherries. Milk and Yogurt  1 cup fat-free or 1% milk.  1 cup soy milk.  6 oz light yogurt with sugar-free sweetener.  6 oz low-fat soy yogurt.  6 oz plain yogurt. Vegetables  1 cup raw or  cup cooked is counted as 0 carbohydrates or a "free" food.  If you eat 3 or more servings at 1 meal, count them as 1 carbohydrate serving. Other Carbohydrates   oz chips or pretzels.   cup ice cream or frozen yogurt.   cup sherbet or sorbet.  2 inch square cake, no frosting.  1 tbs honey, sugar, jam, jelly, or syrup.  2 small cookies.  3 squares of graham crackers.  3 cups popcorn.  6 crackers.  1 cup broth-based soup.  Count 1 cup casserole or other mixed foods as 2 carbohydrate servings.  Foods with less than 20 calories in a serving may be counted as 0 carbohydrates or a "free" food.  You may want to purchase a book or computer software that lists the carbohydrate gram counts of different foods. In addition, the nutrition facts panel on the labels of the foods you eat are a good source of this information. The label will tell you how big the serving size is and the total number of carbohydrate grams you will be eating per serving. Divide this number by 15 to obtain the number of carbohydrate servings in a portion. Remember, 1 carbohydrate serving equals 15 grams of carbohydrate. SERVING SIZES Measuring foods and serving sizes helps you make sure you are getting the right amount of food. The list below  tells how big or small some common serving sizes are.  1 oz.........4 stacked dice.  3 oz........Marland KitchenDeck of cards.  1 tsp.......Marland KitchenTip of little finger.  1 tbs......Marland KitchenMarland KitchenThumb.  2 tbs.......Marland KitchenGolf ball.   cup......Marland KitchenHalf of a fist.  1 cup.......Marland KitchenA fist. SAMPLE DIABETES MEAL PLAN Below is a sample meal plan that includes foods from the grain and starches, dairy, vegetable, fruit, and meat groups. A dietician can individualize a meal plan to fit your calorie needs and tell you the number of servings needed from each food group. However, controlling the total amount of carbohydrates in your meal or snack is more important than making sure you include all of the food groups at every meal. You may interchange carbohydrate containing foods (dairy, starches, and fruits). The meal plan below is an example of a 2000 calorie diet using carbohydrate counting. This meal plan has 17 carbohydrate servings. Breakfast  1 cup oatmeal (2 carb servings).   cup light yogurt (1 carb serving).  1 cup blueberries (1 carb serving).   cup almonds. Snack  1 large apple (2 carb servings).  1 low-fat string cheese stick. Lunch  Chicken breast salad.  1 cup spinach.   cup chopped tomatoes.  2 oz chicken breast, sliced.  2 tbs low-fat [place] dressing.  12 whole-wheat crackers (2 carb servings).  12 to 15 grapes (1 carb serving).  1 cup low-fat milk (1 carb serving). Snack  1 cup carrots.   cup hummus (1 carb serving). Dinner  3 oz broiled salmon.  1 cup brown rice (3 carb servings). Snack  1  cups steamed broccoli (1 carb serving) drizzled with 1 tsp olive oil and lemon juice.  1 cup light pudding (2 carb servings). DIABETES MEAL PLANNING WORKSHEET Your dietician can use this worksheet to help you decide how many servings of foods and what types of foods are right for you.  BREAKFAST Food Group and Servings / Carb Servings Grain/Starches __________________________________ Dairy  __________________________________________ Vegetable ______________________________________ Fruit ___________________________________________ Meat __________________________________________ Fat ____________________________________________ LUNCH Food Group and Servings / Carb Servings Grain/Starches ___________________________________ Dairy ___________________________________________ Fruit ____________________________________________ Meat ___________________________________________ Fat _____________________________________________ [place] Food Group and Servings / Carb Servings Grain/Starches ___________________________________ Dairy ___________________________________________ Fruit ____________________________________________ Meat ___________________________________________ Fat _____________________________________________ SNACKS Food Group and Servings / Carb Servings Grain/Starches ___________________________________ Dairy ___________________________________________ Vegetable _______________________________________ Fruit ____________________________________________ Meat ___________________________________________ Fat _____________________________________________ DAILY TOTALS Starches _________________________ Vegetable ________________________ Fruit ____________________________ Dairy ____________________________ Meat ____________________________ Fat ______________________________ Document Released: 04/09/2005 Document Revised: 10/05/2011 Document Reviewed: 02/18/2009 [employer] Patient Information 2014 [employer]

## 2013-11-22 ENCOUNTER — Ambulatory Visit: Payer: Self-pay | Admitting: Internal Medicine

## 2013-12-25 ENCOUNTER — Ambulatory Visit: Payer: Self-pay | Admitting: Internal Medicine

## 2013-12-28 ENCOUNTER — Ambulatory Visit (INDEPENDENT_AMBULATORY_CARE_PROVIDER_SITE_OTHER): Payer: BC Managed Care – PPO | Admitting: Internal Medicine

## 2013-12-28 ENCOUNTER — Encounter: Payer: Self-pay | Admitting: Internal Medicine

## 2013-12-28 VITALS — BP 126/64 | HR 88 | Temp 98.1°F | Resp 16 | Ht <= 58 in | Wt 130.8 lb

## 2013-12-28 DIAGNOSIS — E785 Hyperlipidemia, unspecified: Secondary | ICD-10-CM

## 2013-12-28 DIAGNOSIS — Z79899 Other long term (current) drug therapy: Secondary | ICD-10-CM

## 2013-12-28 DIAGNOSIS — IMO0001 Reserved for inherently not codable concepts without codable children: Secondary | ICD-10-CM

## 2013-12-28 DIAGNOSIS — Z1389 Encounter for screening for other disorder: Secondary | ICD-10-CM | POA: Insufficient documentation

## 2013-12-28 DIAGNOSIS — E559 Vitamin D deficiency, unspecified: Secondary | ICD-10-CM

## 2013-12-28 DIAGNOSIS — I10 Essential (primary) hypertension: Secondary | ICD-10-CM

## 2013-12-28 DIAGNOSIS — E1165 Type 2 diabetes mellitus with hyperglycemia: Secondary | ICD-10-CM

## 2013-12-28 LAB — CBC WITH DIFFERENTIAL/PLATELET
BASOS PCT: 1 % (ref 0–1)
Basophils Absolute: 0.1 10*3/uL (ref 0.0–0.1)
EOS PCT: 3 % (ref 0–5)
Eosinophils Absolute: 0.3 10*3/uL (ref 0.0–0.7)
HCT: 41.6 % (ref 36.0–46.0)
HEMOGLOBIN: 14 g/dL (ref 12.0–15.0)
LYMPHS ABS: 2.7 10*3/uL (ref 0.7–4.0)
Lymphocytes Relative: 29 % (ref 12–46)
MCH: 29.2 pg (ref 26.0–34.0)
MCHC: 33.7 g/dL (ref 30.0–36.0)
MCV: 86.8 fL (ref 78.0–100.0)
MONO ABS: 0.7 10*3/uL (ref 0.1–1.0)
Monocytes Relative: 8 % (ref 3–12)
NEUTROS PCT: 59 % (ref 43–77)
Neutro Abs: 5.5 10*3/uL (ref 1.7–7.7)
Platelets: 299 10*3/uL (ref 150–400)
RBC: 4.79 MIL/uL (ref 3.87–5.11)
RDW: 13.1 % (ref 11.5–15.5)
WBC: 9.3 10*3/uL (ref 4.0–10.5)

## 2013-12-28 NOTE — Patient Instructions (Addendum)
  Stop ACTOS  - Pioglitizone  Start Tradjenta 5 mg @  1/2 tablet each morning --   if glucoses run over 200, you may increase to 1 whole tablet daily

## 2013-12-28 NOTE — Progress Notes (Signed)
Patient ID: Sara Lamb, female   DOB: Jun 11, 1951, 63 y.o.   MRN: [id number]   This very nice 63 y.o.MWF presents for 3 month follow up with Hypertension, Hyperlipidemia, T2_NIDDM and Vitamin D Deficiency.    HTN predates since 2000. BP has been controlled at home. Today's BP: 126/64 mmHg. Patient denies any cardiac type chest pain, palpitations, dyspnea/orthopnea/PND, dizziness, claudication, or dependent edema.   Hyperlipidemia is not controlled with diet. Last Lipid in Mar 2015 as below is not at goal and patient has alleged  Hx/o statin intolerance.  Lab Results  Component Value Date   CHOL 254* 09/26/2013   HDL 44 09/26/2013   LDLCALC 165* 09/26/2013   TRIG 226* 09/26/2013   CHOLHDL 5.8 09/26/2013    Also, the patient has history of T2_NIDDM since 2000 with poorly controlled A1c of 12.2 in Mar 2015. Patient's poor compliance is complimented by her self proclaimed intolerances or alleged SE's of any & most medications she has been tried on for her diabetes. Patient denies any symptoms of reactive hypoglycemia, diabetic polys, paresthesias or visual blurring.   Further, Patient has history of Vitamin D Deficiency of 17 in 2009 with last vitamin D of   . Patient supplements vitamin D without any suspected side-effects.   Medication List   ALPRAZolam 1 MG tablet  Commonly known as:  XANAX  1/2 to 1 tabret 2 to 3 x daily as needed for nerves     lisinopril 20 MG tablet  Commonly known as:  PRINIVIL,ZESTRIL  Take 1 tablet (20 mg total) by mouth daily.     STRESS TAB NF PO  Take by mouth.               ACTOS  30 mg sporatically   Allergies  Allergen Reactions  . Metformin And Related     ELEVATED BS. GLYBURIDE, ONGLYZA, ACTOS, INVOKANA  . Insulins Rash  . Penicillins Rash   PMHx:   Past Medical History  Diagnosis Date  . Diabetes mellitus without complication   . Anxiety   . Hypertension   . Nonalcoholic hepatosteatosis   . Unspecified vitamin D deficiency   . Hyperlipidemia    . Atherosclerosis of aorta     via CXR 2013   FHx:    Reviewed / unchanged  SHx:    Reviewed / unchanged   Systems Review: Constitutional: Denies fever, chills, wt changes, headaches, insomnia, fatigue, night sweats, change in appetite. Eyes: Denies redness, blurred vision, diplopia, discharge, itchy, watery eyes.  ENT: Denies discharge, congestion, post nasal drip, epistaxis, sore throat, earache, hearing loss, dental pain, tinnitus, vertigo, sinus pain, snoring.  CV: Denies chest pain, palpitations, irregular heartbeat, syncope, dyspnea, diaphoresis, orthopnea, PND, claudication or edema. Respiratory: denies cough, dyspnea, DOE, pleurisy, hoarseness, laryngitis, wheezing.  Gastrointestinal: Denies dysphagia, odynophagia, heartburn, reflux, water brash, abdominal pain or cramps, nausea, vomiting, bloating, diarrhea, constipation, hematemesis, melena, hematochezia  or hemorrhoids. Genitourinary: Denies dysuria, frequency, urgency, nocturia, hesitancy, discharge, hematuria or flank pain. Musculoskeletal: Denies arthralgias, myalgias, stiffness, jt. swelling, pain, limping or strain/sprain.  Skin: Denies pruritus, rash, hives, warts, acne, eczema or change in skin lesion(s). Neuro: No weakness, tremor, incoordination, spasms, paresthesia or pain. Psychiatric: Denies confusion, memory loss or sensory loss. Endo: Denies change in weight, skin or hair change.  Heme/Lymph: No excessive bleeding, bruising or enlarged lymph nodes.   Exam:  BP 126/64  P 88  T 98.1 F   Resp 16  Ht 4' 10"   Wt 130 lb 12.8  oz   BMI 27.34 kg/m2  Appears well nourished - in no distress. Eyes: PERRLA, EOMs, conjunctiva no swelling or erythema. Sinuses: No frontal/maxillary tenderness ENT/Mouth: EAC's clear, TM's nl w/o erythema, bulging. Nares clear w/o erythema, swelling, exudates. Oropharynx clear without erythema or exudates. Oral hygiene is good. Tongue normal, non obstructing. Hearing intact.  Neck:  Supple. Thyroid nl. Car 2+/2+ without bruits, nodes or JVD. Chest: Respirations nl with BS clear & equal w/o rales, rhonchi, wheezing or stridor.  Cor: Heart sounds normal w/ regular rate and rhythm without sig. murmurs, gallops, clicks, or rubs. Peripheral pulses normal and equal  without edema.  Musculoskeletal: Full ROM all peripheral extremities, joint stability, 5/5 strength, and normal gait.  Skin: Warm, dry without exposed rashes, lesions or ecchymosis apparent.  Neuro: Cranial nerves intact, reflexes equal bilaterally. Sensory-motor testing grossly intact. Tendon reflexes grossly intact.  Pysch: Alert & oriented x 3. Insight and judgement nl & appropriate. No ideations.  Assessment and Plan:  1. Hypertension - Continue monitor blood pressure at home. Continue diet/meds same.  2. Hyperlipidemia - Poorly controlled with diet. Continue diet, exercise,& lifestyle modifications. Continue monitor periodic cholesterol/liver & renal functions   3. T2_NIDDM - continue recommend prudent low glycemic diet, weight control, regular exercise, diabetic monitoring and periodic eye exams. Long discussion today regarding her poor compliance and poor prognosis for outcome.  4. Vitamin D Deficiency - Continue supplementation.  Recommended regular exercise, BP monitoring, weight control, and discussed med and SE's. Recommended labs to assess and monitor clinical status. Further disposition pending results of labs.

## 2013-12-29 LAB — HEPATIC FUNCTION PANEL
ALBUMIN: 4.1 g/dL (ref 3.5–5.2)
ALK PHOS: 124 U/L — AB (ref 39–117)
ALT: 20 U/L (ref 0–35)
AST: 19 U/L (ref 0–37)
Bilirubin, Direct: 0.1 mg/dL (ref 0.0–0.3)
Indirect Bilirubin: 0.2 mg/dL (ref 0.2–1.2)
TOTAL PROTEIN: 7.5 g/dL (ref 6.0–8.3)
Total Bilirubin: 0.3 mg/dL (ref 0.2–1.2)

## 2013-12-29 LAB — HEMOGLOBIN A1C
HEMOGLOBIN A1C: 9.7 % — AB (ref <5.7)
Mean Plasma Glucose: 232 mg/dL — ABNORMAL HIGH (ref <117)

## 2013-12-29 LAB — MAGNESIUM: MAGNESIUM: 1.9 mg/dL (ref 1.5–2.5)

## 2013-12-29 LAB — LIPID PANEL
Cholesterol: 209 mg/dL — ABNORMAL HIGH (ref 0–200)
HDL: 60 mg/dL (ref >39)
LDL CALC: 109 mg/dL — AB (ref 0–99)
Total CHOL/HDL Ratio: 3.5 Ratio
Triglycerides: 199 mg/dL — ABNORMAL HIGH (ref <150)
VLDL: 40 mg/dL (ref 0–40)

## 2013-12-29 LAB — BASIC METABOLIC PANEL WITH GFR
BUN: 7 mg/dL (ref 6–23)
CHLORIDE: 87 meq/L — AB (ref 96–112)
CO2: 25 mEq/L (ref 19–32)
Calcium: 9.7 mg/dL (ref 8.4–10.5)
Creat: 0.61 mg/dL (ref 0.50–1.10)
GFR, Est African American: >89 mL/min
Glucose, Bld: 254 mg/dL — ABNORMAL HIGH (ref 70–99)
POTASSIUM: 4.5 meq/L (ref 3.5–5.3)
Sodium: 126 mEq/L — ABNORMAL LOW (ref 135–145)

## 2013-12-29 LAB — TSH: TSH: 0.86 u[IU]/mL (ref 0.350–4.500)

## 2013-12-29 LAB — INSULIN, FASTING: INSULIN FASTING, SERUM: 53 u[IU]/mL — AB (ref 3–28)

## 2013-12-29 LAB — VITAMIN D 25 HYDROXY (VIT D DEFICIENCY, FRACTURES): VIT D 25 HYDROXY: 24 ng/mL — AB (ref 30–89)

## 2014-01-11 ENCOUNTER — Ambulatory Visit (INDEPENDENT_AMBULATORY_CARE_PROVIDER_SITE_OTHER): Payer: BC Managed Care – PPO | Admitting: Internal Medicine

## 2014-01-11 ENCOUNTER — Encounter: Payer: Self-pay | Admitting: Internal Medicine

## 2014-01-11 VITALS — BP 132/68 | HR 88 | Temp 97.7°F | Resp 16 | Ht <= 58 in | Wt 136.0 lb

## 2014-01-11 DIAGNOSIS — F411 Generalized anxiety disorder: Secondary | ICD-10-CM

## 2014-01-11 MED ORDER — ALPRAZOLAM 1 MG PO TABS: ORAL_TABLET | ORAL | Status: DC

## 2014-01-11 NOTE — Patient Instructions (Signed)
Recommend the book "the END of DIETING" by Dr Baker Janus   Get the  Book "The END of DIABETES " by Dr Excell Seltzer  At [email or web address] - get book & Audio CD's      Being diabetic has a  300% increased risk for heart attack, stroke, cancer, and alzheimer- type vascular dementia. It is very important that you work harder with diet by avoiding all foods that are white except chicken & fish. Avoid white rice (brown & wild rice is OK), white potatoes (sweetpotatoes in moderation is OK), White bread or wheat bread or anything made out of white flour like bagels, donuts, rolls, buns, biscuits, cakes, pastries, cookies, pizza crust, and pasta (made from white flour & egg whites) - vegetarian pasta or spinach or wheat pasta is OK. Multigrain breads like Arnold's or Pepper[employer], or multigrain sandwich thins or flatbreads.  Diet, exercise and weight loss can reverse and cure diabetes in the early stages.  Diet, exercise and weight loss is very important in the control and prevention of complications of diabetes which affects every system in your body, ie. Brain - dementia/stroke, eyes - glaucoma/blindness, heart - heart attack/heart failure, kidneys - dialysis, stomach - gastric paralysis, intestines - malabsorption, nerves - severe painful neuritis, circulation - gangrene & loss of a leg(s), and finally cancer and Alzheimers.    I recommend avoid fried & greasy foods,  sweets/candy, white rice (brown or wild rice or Quinoa is OK), white potatoes (sweet potatoes are OK) - anything made from white flour - bagels, doughnuts, rolls, buns, biscuits,white and wheat breads, pizza crust and traditional pasta made of white flour & egg white(vegetarian pasta or spinach or wheat pasta is OK).  Multi-grain bread is OK - like multi-grain flat bread or sandwich thins. Avoid alcohol in excess. Exercise is also important.    Eat all the vegetables you want - avoid meat, especially red meat and dairy - especially cheese.  Cheese is  the most concentrated form of trans-fats which is the worst thing to clog up our arteries. Veggie cheese is OK which can be found in the fresh produce section at [facility] or [employer] or [employer]

## 2014-01-11 NOTE — Progress Notes (Signed)
Subjective:    Patient ID: Sara Lamb, female    DOB: 1950-10-31, 63 y.o.   MRN: [id number]  HPI Alveena returns today and has stopped Actos as recommended and taken the Sx's os Tradjenta she was given. Review of her FBG's show values ranging from 100- 250 mg%. She denies any reactive hypoglycemic reactions and has had no diabetic polys paresthesias or blurred vision. She does report a 5# wt gain and ankle swelling since on the tradjenta for the last 2 weeks.    Medication List       This list is accurate as of: 01/11/14  7:20 PM.  Always use your most recent med list.               ALPRAZolam 1 MG tablet  Commonly known as:  XANAX  1/2 to 1 tablet 2 to 3 x daily as needed for nerves  Start taking on:  04/13/2014     lisinopril 20 MG tablet  Commonly known as:  PRINIVIL,ZESTRIL  Take 1 tablet (20 mg total) by mouth daily.     STRESS TAB NF PO  Take by mouth.     TRADJENTA 5 MG Tabs tablet  Generic drug:  linagliptin  Take 5 mg by mouth daily. Patient takes 1/2 tab daily unless glucose is above 200 and then she takes 1 whole tab.       Allergies  Allergen Reactions  . Metformin And Related     ELEVATED BS. GLYBURIDE, ONGLYZA, ACTOS, INVOKANA  . Insulins Rash  . Penicillins Rash   Past Medical History  Diagnosis Date  . Diabetes mellitus without complication   . Anxiety   . Hypertension   . Nonalcoholic hepatosteatosis   . Unspecified vitamin D deficiency   . Hyperlipidemia   . Atherosclerosis of aorta     via CXR 2013   Review of Systems In addition to the HPI above,  No Fever-chills,  No Headache, No changes with Vision or hearing,  No problems swallowing food or Liquids,  No Chest pain or productive Cough or Shortness of Breath,  No Abdominal pain, No Nausea or Vommitting, Bowel movements are regular,  No Blood in stool or Urine,  No dysuria,  No new skin rashes or bruises,  No new joints pains-aches,  No new weakness, tingling, numbness in any  extremity,  No recent weight loss,  No polyuria, polydypsia or polyphagia,  No significant Mental Stressors.  A full 10 point Review of Systems was done, except as stated above, all other Review of Systems were negative  Objective:   Physical Exam BP 132/68  Pulse 88  Temp 97.7 F   Resp 16  Ht 4' 10"    Wt 136 lb   BMI 28.43 kg/m2  HEENT - Eac's patent. TM's Nl.EOM's full. PERRLA. NasoOroPharynx clear. Neck - supple. Nl Thyroid. No bruits nodes JVD Chest - Clear equal BS Cor - Nl HS. RRR w/o sig MGR. PP 1(+) / trace ankle edema edema. Abd - No palpable organomegaly, masses or tenderness. BS nl. MS- FROM. w/o deformities. Muscle power tone and bulk Nl. Gait Nl. Neuro - No obvious Cr N abnormalities. Sensory, motor and Cerebellar functions appear Nl w/o focal abnormalities.  Assessment & Plan:   1. T2_NIDDM - d/c Tradjenta - Sx's Invokana 100 mg # 10, the 300 mg # 15 - take 1/2 - ROV 6 weeks  2. Anxiety state, unspecified  - ALPRAZolam (XANAX) 1 MG tablet; 1/2 to 1 tablet 2  to 3 x daily as needed for nerves  Dispense: 90 tablet; Refill: 3

## 2014-03-01 ENCOUNTER — Other Ambulatory Visit: Payer: Self-pay | Admitting: *Deleted

## 2014-03-01 ENCOUNTER — Encounter: Payer: Self-pay | Admitting: Internal Medicine

## 2014-03-01 ENCOUNTER — Ambulatory Visit (INDEPENDENT_AMBULATORY_CARE_PROVIDER_SITE_OTHER): Payer: BC Managed Care – PPO | Admitting: Internal Medicine

## 2014-03-01 VITALS — BP 122/76 | HR 96 | Temp 97.7°F | Resp 16 | Ht <= 58 in | Wt 133.4 lb

## 2014-03-01 DIAGNOSIS — IMO0001 Reserved for inherently not codable concepts without codable children: Secondary | ICD-10-CM

## 2014-03-01 DIAGNOSIS — E1165 Type 2 diabetes mellitus with hyperglycemia: Principal | ICD-10-CM

## 2014-03-01 DIAGNOSIS — I10 Essential (primary) hypertension: Secondary | ICD-10-CM

## 2014-03-01 MED ORDER — LISINOPRIL 20 MG PO TABS: 20.0000 mg | ORAL_TABLET | Freq: Every day | ORAL | Status: DC

## 2014-03-01 NOTE — Progress Notes (Signed)
   Subjective:    Patient ID: Sara Lamb, female    DOB: [birth year], 63 y.o.   MRN: [id number]  HPI patient returns for f/u after a trial on Invokana which she felt did not control her sugars - running in the 100-200 mg% range. She has had various strange and inconsu=istant reactions or perceived intoleram=nces on several hypoglycemic agents in the past allegedly to MF, insulins, onglyza, invokana and had severe weight gain with Actos. Other than by checking her CBG's, she is unaware of any symptoms and has had no hypoglycemic reactions, polys, visual blurring or paresthesias. Medication Sig  .  ALPRAZolam (XANAX) 1 MG tablet 1/2 to 1 tablet 2 to 3 x daily as needed for nerves  . Multiple Vitamins-Minerals     Allergies  Allergen Reactions  . Metformin And Related     ELEVATED BS. GLYBURIDE, ONGLYZA, ACTOS, INVOKANA  . Insulins Rash  . Penicillins Rash   Past Medical History  Diagnosis Date  . Diabetes mellitus without complication   . Anxiety   . Hypertension   . Nonalcoholic hepatosteatosis   . Unspecified vitamin D deficiency   . Hyperlipidemia   . Atherosclerosis of aorta     via CXR 2013   Review of Systems  In addition to the HPI above,  No Fever-chills,  No Headache, No changes with Vision or hearing,  No problems swallowing food or Liquids,  No Chest pain or productive Cough or Shortness of Breath,  No Abdominal pain, No Nausea or Vomitting, Bowel movements are regular,  No Blood in stool or Urine,  No dysuria,  No new skin rashes or bruises,  No new joints pains-aches,  No new weakness, tingling, numbness in any extremity,  No recent weight loss,  No polyuria, polydypsia or polyphagia,  No significant Mental Stressors.  A full 10 point Review of Systems was done, except as stated above, all other Review of Systems were negative  Objective:   Physical Exam BP 122/76  Pulse 96  Temp(Src) 97.7 F (36.5 C) (Temporal)  Resp 16  Ht 4' 10"  (1.473 m)  Wt 133 lb  6.4 oz (60.51 kg)  BMI 27.89 kg/m2  HEENT - Eac's patent. TM's Nl. EOM's full. PERRLA. NasoOroPharynx clear. Neck - supple. Nl Thyroid. Carotids 2+ & No bruits, nodes, JVD Chest - Clear equal BS w/o Rales, rhonchi, wheezes. Cor - Nl HS. RRR w/o sig MGR. PP 1(+). No edema. MS- FROM w/o deformities. Muscle power, tone and bulk Nl. Gait Nl. Neuro - No obvious Cr N abnormalities. Sensory, motor and Cerebellar functions appear Nl w/o focal abnormalities. Psyche - Mental status normal & appropriate.  Assessment & Plan:    1. T2_NIDDM - 3 wk Sx's Janumet 50/500 bid then 2 wk 50/1000 bid   2. Hypertension  - ROV 4 weeks for reassessment

## 2014-03-29 ENCOUNTER — Ambulatory Visit (INDEPENDENT_AMBULATORY_CARE_PROVIDER_SITE_OTHER): Payer: BC Managed Care – PPO | Admitting: Emergency Medicine

## 2014-03-29 ENCOUNTER — Encounter: Payer: Self-pay | Admitting: Emergency Medicine

## 2014-03-29 VITALS — BP 142/86 | HR 92 | Temp 98.2°F | Resp 16 | Ht <= 58 in | Wt 133.0 lb

## 2014-03-29 DIAGNOSIS — E782 Mixed hyperlipidemia: Secondary | ICD-10-CM

## 2014-03-29 DIAGNOSIS — L089 Local infection of the skin and subcutaneous tissue, unspecified: Secondary | ICD-10-CM

## 2014-03-29 DIAGNOSIS — R7309 Other abnormal glucose: Secondary | ICD-10-CM

## 2014-03-29 DIAGNOSIS — I10 Essential (primary) hypertension: Secondary | ICD-10-CM

## 2014-03-29 DIAGNOSIS — R51 Headache: Secondary | ICD-10-CM

## 2014-03-29 DIAGNOSIS — E119 Type 2 diabetes mellitus without complications: Secondary | ICD-10-CM

## 2014-03-29 LAB — BASIC METABOLIC PANEL WITH GFR
BUN: 9 mg/dL (ref 6–23)
CALCIUM: 9.7 mg/dL (ref 8.4–10.5)
CO2: 28 meq/L (ref 19–32)
Chloride: 90 mEq/L — ABNORMAL LOW (ref 96–112)
Creat: 0.64 mg/dL (ref 0.50–1.10)
GFR, Est African American: >89 mL/min
GFR, Est Non African American: >89 mL/min
GLUCOSE: 159 mg/dL — AB (ref 70–99)
Potassium: 4.3 mEq/L (ref 3.5–5.3)
SODIUM: 130 meq/L — AB (ref 135–145)

## 2014-03-29 LAB — LIPID PANEL
CHOL/HDL RATIO: 4.3 ratio
CHOLESTEROL: 252 mg/dL — AB (ref 0–200)
HDL: 58 mg/dL (ref >39)
LDL CALC: 159 mg/dL — AB (ref 0–99)
Triglycerides: 173 mg/dL — ABNORMAL HIGH (ref <150)
VLDL: 35 mg/dL (ref 0–40)

## 2014-03-29 LAB — CBC WITH DIFFERENTIAL/PLATELET
Basophils Absolute: 0.1 10*3/uL (ref 0.0–0.1)
Basophils Relative: 1 % (ref 0–1)
Eosinophils Absolute: 0.3 10*3/uL (ref 0.0–0.7)
Eosinophils Relative: 4 % (ref 0–5)
HCT: 45.2 % (ref 36.0–46.0)
HEMOGLOBIN: 15.6 g/dL — AB (ref 12.0–15.0)
LYMPHS PCT: 32 % (ref 12–46)
Lymphs Abs: 2.7 10*3/uL (ref 0.7–4.0)
MCH: 30.2 pg (ref 26.0–34.0)
MCHC: 34.5 g/dL (ref 30.0–36.0)
MCV: 87.4 fL (ref 78.0–100.0)
Monocytes Absolute: 0.7 10*3/uL (ref 0.1–1.0)
Monocytes Relative: 8 % (ref 3–12)
NEUTROS ABS: 4.7 10*3/uL (ref 1.7–7.7)
Neutrophils Relative %: 55 % (ref 43–77)
Platelets: 257 10*3/uL (ref 150–400)
RBC: 5.17 MIL/uL — ABNORMAL HIGH (ref 3.87–5.11)
RDW: 12.9 % (ref 11.5–15.5)
WBC: 8.5 10*3/uL (ref 4.0–10.5)

## 2014-03-29 LAB — HEPATIC FUNCTION PANEL
ALT: 33 U/L (ref 0–35)
AST: 31 U/L (ref 0–37)
Albumin: 4.4 g/dL (ref 3.5–5.2)
Alkaline Phosphatase: 127 U/L — ABNORMAL HIGH (ref 39–117)
BILIRUBIN INDIRECT: 0.3 mg/dL (ref 0.2–1.2)
Bilirubin, Direct: 0.1 mg/dL (ref 0.0–0.3)
TOTAL PROTEIN: 7.8 g/dL (ref 6.0–8.3)
Total Bilirubin: 0.4 mg/dL (ref 0.2–1.2)

## 2014-03-29 LAB — HEMOGLOBIN A1C
Hgb A1c MFr Bld: 8.3 % — ABNORMAL HIGH (ref <5.7)
MEAN PLASMA GLUCOSE: 192 mg/dL — AB (ref <117)

## 2014-03-29 MED ORDER — PROMETHAZINE HCL 25 MG/ML IJ SOLN
25.0000 mg | Freq: Once | INTRAMUSCULAR | Status: AC
Start: 2014-03-29 — End: 2014-03-29
  Administered 2014-03-29: 25 mg via INTRAMUSCULAR

## 2014-03-29 MED ORDER — KETOROLAC TROMETHAMINE 60 MG/2ML IM SOLN
60.0000 mg | Freq: Once | INTRAMUSCULAR | Status: AC
  Administered 2014-03-29: 60 mg via INTRAMUSCULAR

## 2014-03-29 MED ORDER — DOXYCYCLINE HYCLATE 100 MG PO TABS: 100.0000 mg | ORAL_TABLET | Freq: Two times a day (BID) | ORAL | Status: DC

## 2014-03-29 NOTE — Patient Instructions (Signed)
Phenergan 25 mg and Toradol 60 mg injections given for headache today  General Headache Without Cause A headache is pain or discomfort felt around the head or neck area. The specific cause of a headache may not be found. There are many causes and types of headaches. A few common ones are:  Tension headaches.  Migraine headaches.  Cluster headaches.  Chronic daily headaches. HOME CARE INSTRUCTIONS   Keep all follow-up appointments with your caregiver or any specialist referral.  Only take over-the-counter or prescription medicines for pain or discomfort as directed by your caregiver.  Lie down in a dark, quiet room when you have a headache.  Keep a headache journal to find out what may trigger your migraine headaches. For example, write down:  What you eat and drink.  How much sleep you get.  Any change to your diet or medicines.  Try massage or other relaxation techniques.  Put ice packs or heat on the head and neck. Use these 3 to 4 times per day for 15 to 20 minutes each time, or as needed.  Limit stress.  Sit up straight, and do not tense your muscles.  Quit smoking if you smoke.  Limit alcohol use.  Decrease the amount of caffeine you drink, or stop drinking caffeine.  Eat and sleep on a regular schedule.  Get 7 to 9 hours of sleep, or as recommended by your caregiver.  Keep lights dim if bright lights bother you and make your headaches worse. SEEK MEDICAL CARE IF:   You have problems with the medicines you were prescribed.  Your medicines are not working.  You have a change from the usual headache.  You have nausea or vomiting. SEEK IMMEDIATE MEDICAL CARE IF:   Your headache becomes severe.  You have a fever.  You have a stiff neck.  You have loss of vision.  You have muscular weakness or loss of muscle control.  You start losing your balance or have trouble walking.  You feel faint or pass out.  You have severe symptoms that are different  from your first symptoms. MAKE SURE YOU:   Understand these instructions.  Will watch your condition.  Will get help right away if you are not doing well or get worse. Document Released: 07/13/2005 Document Revised: 10/05/2011 Document Reviewed: 07/29/2011 [facility] Patient Information 2015 [place], [place]. This information is not intended to replace advice given to you by your health care provider. Make sure you discuss any questions you have with your health care provider.

## 2014-03-29 NOTE — Progress Notes (Signed)
Subjective:    Patient ID: Sara Lamb, female    DOB: 01-31-1951, 63 y.o.   MRN: [id number]  HPI Comments: 63 y.o. WF She started Janumet and has had high sugars/ increased shakes/ and rash on shoulder. She started Janumet 03/02/14. SHe has a bad headache/ nausea and feels weak. She notes she feels sharp pains in left side of head. She refuses ER evaluation. She denies relief with Xanax and excedrin. BS 200s. She notes INvokana 100 worked better, BS 140s but on 300s BS was 200s. She is not exercising but keeps active. She notes she eats healthy but still has bread. WBC             9.3   12/28/2013 HGB            14.0   12/28/2013 HCT            41.6   12/28/2013 PLT             299   12/28/2013 GLUCOSE         254    CHOL            209   12/28/2013 TRIG            199   12/28/2013 HDL              60   12/28/2013 LDLCALC         109   12/28/2013 ALT              20   12/28/2013 AST              19   12/28/2013 NA              126   12/28/2013 K               4.5   12/28/2013 CL               87   12/28/2013 CREATININE     0.61   12/28/2013 BUN               7   12/28/2013 CO2              25   12/28/2013 TSH           0.860   12/28/2013 HGBA1C          9.7   12/28/2013   Hypertension Associated symptoms include headaches.  Diabetes Hypoglycemia symptoms include headaches and tremors.     Medication List       This list is accurate as of: 03/29/14  1:26 PM.  Always use your most recent med list.               ALPRAZolam 1 MG tablet  Commonly known as:  XANAX  1/2 to 1 tablet 2 to 3 x daily as needed for nerves  Start taking on:  04/13/2014     doxycycline 100 MG tablet  Commonly known as:  VIBRA-TABS  Take 1 tablet (100 mg total) by mouth 2 (two) times daily.     lisinopril 20 MG tablet  Commonly known as:  PRINIVIL,ZESTRIL  Take 1 tablet (20 mg total) by mouth daily.     OVER THE COUNTER MEDICATION  percogesic 3 tabs daily for pain     STRESS TAB NF PO  Take by mouth.       Allergies    Allergen Reactions  . Metformin  And Related     ELEVATED BS. GLYBURIDE, ONGLYZA, ACTOS, INVOKANA  . Insulins Rash  . Penicillins Rash   Past Medical History  Diagnosis Date  . Diabetes mellitus without complication   . Anxiety   . Hypertension   . Nonalcoholic hepatosteatosis   . Unspecified vitamin D deficiency   . Hyperlipidemia   . Atherosclerosis of aorta     via CXR 2013    Review of Systems  Gastrointestinal: Positive for nausea.  Skin: Positive for wound.  Neurological: Positive for tremors and headaches.  All other systems reviewed and are negative.  BP 142/86  Pulse 92  Temp(Src) 98.2 F (36.8 C) (Temporal)  Resp 16  Ht 4' 10"  (1.473 m)  Wt 133 lb (60.328 kg)  BMI 27.80 kg/m2     Objective:   Physical Exam  Nursing note and vitals reviewed. Constitutional: She is oriented to person, place, and time. She appears well-developed and well-nourished. No distress.  HENT:  Head: Normocephalic and atraumatic.  Right Ear: External ear normal.  Left Ear: External ear normal.  Nose: Nose normal.  Mouth/Throat: Oropharynx is clear and moist.  Eyes: Conjunctivae and EOM are normal.  Neck: Normal range of motion. Neck supple. No JVD present. No thyromegaly present.  Cardiovascular: Normal rate, regular rhythm, normal heart sounds and intact distal pulses.   Pulmonary/Chest: Effort normal and breath sounds normal.  Abdominal: Soft. Bowel sounds are normal. She exhibits no distension. There is no tenderness.  Musculoskeletal: Normal range of motion. She exhibits no edema and no tenderness.       Arms: Lymphadenopathy:    She has no cervical adenopathy.  Neurological: She is alert and oriented to person, place, and time. No cranial nerve deficit. Coordination normal.  Patient with left hand/ arm tremor appears to be intentional because easily stops when distracted.   Skin: Skin is warm and dry. No rash noted. No erythema. No pallor.  Psychiatric: She has a normal  mood and affect. Her behavior is normal. Judgment and thought content normal.          Assessment & Plan:  1.  3 month F/U for HTN, Cholesterol, Pre-Dm, D. Deficient. Needs healthy diet, cardio QD and obtain healthy weight. Check Labs, Check BP if >130/80 call office. D/C Janumet. Start Invokana 100 mg SX # 42 1 daily after 3 days off Janumet.  2. Skin infection- Start Doxy tomorrow AD. Call if symptoms increase  3. HA/ tremors vs Anxiety vs Drug reaction- Advised ER but declines, Advised push fluids. Toradol and Phenergan injections given advise rest x 2 days.

## 2014-04-02 ENCOUNTER — Other Ambulatory Visit: Payer: Self-pay | Admitting: Emergency Medicine

## 2014-04-02 DIAGNOSIS — R05 Cough: Secondary | ICD-10-CM

## 2014-04-02 DIAGNOSIS — R059 Cough, unspecified: Secondary | ICD-10-CM

## 2014-04-09 ENCOUNTER — Ambulatory Visit: Payer: Self-pay | Admitting: Internal Medicine

## 2014-05-02 ENCOUNTER — Encounter: Payer: Self-pay | Admitting: Physician Assistant

## 2014-05-02 ENCOUNTER — Ambulatory Visit (INDEPENDENT_AMBULATORY_CARE_PROVIDER_SITE_OTHER): Payer: BC Managed Care – PPO | Admitting: Physician Assistant

## 2014-05-02 VITALS — BP 122/64 | HR 88 | Temp 97.9°F | Resp 16 | Ht <= 58 in | Wt 134.0 lb

## 2014-05-02 DIAGNOSIS — R21 Rash and other nonspecific skin eruption: Secondary | ICD-10-CM

## 2014-05-02 DIAGNOSIS — I10 Essential (primary) hypertension: Secondary | ICD-10-CM

## 2014-05-02 DIAGNOSIS — L98499 Non-pressure chronic ulcer of skin of other sites with unspecified severity: Secondary | ICD-10-CM

## 2014-05-02 LAB — BASIC METABOLIC PANEL WITH GFR
BUN: 9 mg/dL (ref 6–23)
CHLORIDE: 98 meq/L (ref 96–112)
CO2: 28 mEq/L (ref 19–32)
Calcium: 9.8 mg/dL (ref 8.4–10.5)
Creat: 0.71 mg/dL (ref 0.50–1.10)
Glucose, Bld: 219 mg/dL — ABNORMAL HIGH (ref 70–99)
POTASSIUM: 4.5 meq/L (ref 3.5–5.3)
Sodium: 135 mEq/L (ref 135–145)

## 2014-05-02 LAB — CBC WITH DIFFERENTIAL/PLATELET
BASOS ABS: 0.1 10*3/uL (ref 0.0–0.1)
BASOS PCT: 1 % (ref 0–1)
Eosinophils Absolute: 0.3 10*3/uL (ref 0.0–0.7)
Eosinophils Relative: 4 % (ref 0–5)
HEMATOCRIT: 45.8 % (ref 36.0–46.0)
Hemoglobin: 15.6 g/dL — ABNORMAL HIGH (ref 12.0–15.0)
Lymphocytes Relative: 28 % (ref 12–46)
Lymphs Abs: 2.4 10*3/uL (ref 0.7–4.0)
MCH: 29.8 pg (ref 26.0–34.0)
MCHC: 34.1 g/dL (ref 30.0–36.0)
MCV: 87.4 fL (ref 78.0–100.0)
MONOS PCT: 8 % (ref 3–12)
Monocytes Absolute: 0.7 10*3/uL (ref 0.1–1.0)
Neutro Abs: 5.1 10*3/uL (ref 1.7–7.7)
Neutrophils Relative %: 59 % (ref 43–77)
Platelets: 273 10*3/uL (ref 150–400)
RBC: 5.24 MIL/uL — AB (ref 3.87–5.11)
RDW: 13 % (ref 11.5–15.5)
WBC: 8.7 10*3/uL (ref 4.0–10.5)

## 2014-05-02 LAB — HEPATIC FUNCTION PANEL
ALK PHOS: 117 U/L (ref 39–117)
ALT: 25 U/L (ref 0–35)
AST: 23 U/L (ref 0–37)
Albumin: 4.2 g/dL (ref 3.5–5.2)
BILIRUBIN TOTAL: 0.4 mg/dL (ref 0.2–1.2)
Bilirubin, Direct: 0.1 mg/dL (ref 0.0–0.3)
Indirect Bilirubin: 0.3 mg/dL (ref 0.2–1.2)
TOTAL PROTEIN: 7.5 g/dL (ref 6.0–8.3)

## 2014-05-02 LAB — TSH: TSH: 0.848 u[IU]/mL (ref 0.350–4.500)

## 2014-05-02 NOTE — Progress Notes (Signed)
   Subjective:    Patient ID: Sara Lamb, female    DOB: April 08, 1951, 63 y.o.   MRN: [id number]  HPI 63 y.o. female with uncontrolled DM due to noncompliance/reactions to medications presents for follow up on sugars and a rash. Patient was seen on Jaument and started to have rashes on her shoulder described at 5 mm ulcer/vesicles on shoulders, she was taken off this and put on invokana 125m daily. She states that rash has gotten worse, she has 5-6 ulcers on bilateral shoulders, they are pruritic and painful. She has put hydrocortisone, save and benadryl on it.   Wt Readings from Last 3 Encounters:  05/02/14 134 lb (60.782 kg)  03/29/14 133 lb (60.328 kg)  03/01/14 133 lb 6.4 oz (60.51 kg)     Review of Systems  Constitutional: Positive for diaphoresis. Negative for fever, chills, activity change, appetite change, fatigue and unexpected weight change.  HENT: Negative.   Respiratory: Negative.   Cardiovascular: Negative.   Gastrointestinal: Negative.   Genitourinary: Negative.   Musculoskeletal: Negative.   Skin: Positive for rash.  Neurological: Negative.        Objective:   Physical Exam  Constitutional: She is oriented to person, place, and time. She appears well-developed and well-nourished.  HENT:  Head: Normocephalic and atraumatic.  Eyes: Conjunctivae are normal. Pupils are equal, round, and reactive to light.  Neck: Normal range of motion. Neck supple.  Cardiovascular: Normal rate and regular rhythm.   Pulmonary/Chest: Effort normal and breath sounds normal.  Abdominal: Soft. Bowel sounds are normal.  Lymphadenopathy:    She has no cervical adenopathy.  Neurological: She is alert and oriented to person, place, and time.  Skin: Skin is warm and dry. Rash noted.  Largest ulcer is on anterior right shoulder measuring 3x1 and area of erythema of 2.5 x 3.5, with some having an eschar on it, + for warmth and tenderness.        Assessment & Plan:  1. Skin ulcer of  shoulder, with unspecified severity Stop meds, will send pathology, ? Need to send in gapapentin - Dermatology pathology - CBC with Differential - BASIC METABOLIC PANEL WITH GFR - Hepatic function panel - TSH

## 2014-06-07 ENCOUNTER — Ambulatory Visit (INDEPENDENT_AMBULATORY_CARE_PROVIDER_SITE_OTHER): Payer: BC Managed Care – PPO | Admitting: Physician Assistant

## 2014-06-07 ENCOUNTER — Encounter: Payer: Self-pay | Admitting: Physician Assistant

## 2014-06-07 VITALS — BP 140/72 | HR 80 | Temp 97.7°F | Resp 16 | Ht <= 58 in | Wt 133.0 lb

## 2014-06-07 DIAGNOSIS — M609 Myositis, unspecified: Secondary | ICD-10-CM

## 2014-06-07 DIAGNOSIS — E1065 Type 1 diabetes mellitus with hyperglycemia: Secondary | ICD-10-CM

## 2014-06-07 DIAGNOSIS — M791 Myalgia: Secondary | ICD-10-CM

## 2014-06-07 DIAGNOSIS — E1069 Type 1 diabetes mellitus with other specified complication: Secondary | ICD-10-CM

## 2014-06-07 DIAGNOSIS — E785 Hyperlipidemia, unspecified: Secondary | ICD-10-CM

## 2014-06-07 DIAGNOSIS — I10 Essential (primary) hypertension: Secondary | ICD-10-CM

## 2014-06-07 DIAGNOSIS — E108 Type 1 diabetes mellitus with unspecified complications: Secondary | ICD-10-CM

## 2014-06-07 DIAGNOSIS — IMO0001 Reserved for inherently not codable concepts without codable children: Secondary | ICD-10-CM

## 2014-06-07 DIAGNOSIS — E559 Vitamin D deficiency, unspecified: Secondary | ICD-10-CM

## 2014-06-07 DIAGNOSIS — K760 Fatty (change of) liver, not elsewhere classified: Secondary | ICD-10-CM

## 2014-06-07 DIAGNOSIS — IMO0002 Reserved for concepts with insufficient information to code with codable children: Secondary | ICD-10-CM

## 2014-06-07 DIAGNOSIS — Z79899 Other long term (current) drug therapy: Secondary | ICD-10-CM

## 2014-06-07 LAB — HEPATIC FUNCTION PANEL
ALBUMIN: 4 g/dL (ref 3.5–5.2)
ALK PHOS: 125 U/L — AB (ref 39–117)
ALT: 17 U/L (ref 0–35)
AST: 19 U/L (ref 0–37)
BILIRUBIN INDIRECT: 0.3 mg/dL (ref 0.2–1.2)
Bilirubin, Direct: 0.1 mg/dL (ref 0.0–0.3)
Total Bilirubin: 0.4 mg/dL (ref 0.2–1.2)
Total Protein: 7.5 g/dL (ref 6.0–8.3)

## 2014-06-07 LAB — CBC WITH DIFFERENTIAL/PLATELET
Basophils Absolute: 0.1 10*3/uL (ref 0.0–0.1)
Basophils Relative: 1 % (ref 0–1)
Eosinophils Absolute: 0.3 10*3/uL (ref 0.0–0.7)
Eosinophils Relative: 3 % (ref 0–5)
HCT: 44.1 % (ref 36.0–46.0)
HEMOGLOBIN: 14.6 g/dL (ref 12.0–15.0)
LYMPHS ABS: 2.2 10*3/uL (ref 0.7–4.0)
LYMPHS PCT: 25 % (ref 12–46)
MCH: 29 pg (ref 26.0–34.0)
MCHC: 33.1 g/dL (ref 30.0–36.0)
MCV: 87.5 fL (ref 78.0–100.0)
MONOS PCT: 8 % (ref 3–12)
Monocytes Absolute: 0.7 10*3/uL (ref 0.1–1.0)
Neutro Abs: 5.5 10*3/uL (ref 1.7–7.7)
Neutrophils Relative %: 63 % (ref 43–77)
PLATELETS: 289 10*3/uL (ref 150–400)
RBC: 5.04 MIL/uL (ref 3.87–5.11)
RDW: 12.9 % (ref 11.5–15.5)
WBC: 8.7 10*3/uL (ref 4.0–10.5)

## 2014-06-07 LAB — BASIC METABOLIC PANEL WITH GFR
BUN: 6 mg/dL (ref 6–23)
CHLORIDE: 93 meq/L — AB (ref 96–112)
CO2: 26 mEq/L (ref 19–32)
Calcium: 9.6 mg/dL (ref 8.4–10.5)
Creat: 0.58 mg/dL (ref 0.50–1.10)
GFR, Est Non African American: >89 mL/min
GLUCOSE: 264 mg/dL — AB (ref 70–99)
POTASSIUM: 4.3 meq/L (ref 3.5–5.3)
Sodium: 131 mEq/L — ABNORMAL LOW (ref 135–145)

## 2014-06-07 LAB — TSH: TSH: 0.681 u[IU]/mL (ref 0.350–4.500)

## 2014-06-07 NOTE — Patient Instructions (Signed)
Uncontrolled diabetes- suggest trying to get back on the glipizide or glimeperide 1/2 pills daily with your largest meal, try strict diet.  TIA?- suggest adding 1/2 bASA daily or every other day  Go to the ER if any CP, SOB, nausea, dizziness, severe HA, changes vision/speech     Bad carbs also include fruit juice, alcohol, and sweet tea. These are empty calories that do not signal to your brain that you are full.   Please remember the good carbs are still carbs which convert into sugar. So please measure them out no more than 1/2-1 cup of rice, oatmeal, pasta, and beans  Veggies are however free foods! Pile them on.   Not all fruit is created equal. Please see the list below, the fruit at the bottom is higher in sugars than the fruit at the top. Please avoid all dried fruits.

## 2014-06-07 NOTE — Progress Notes (Signed)
HPI 63 y.o.female presents for rash has improved and her sugars are uncontrolled because she can not tolerate any DM medications. She was doing well on  She has had bilateral leg pain, right leg pain worse than left due to previous injury.  She states she has been very dizzy for 2-3 weeks, she has been having fatigue with exertion, if she takes groceries into the house she will have to sit down. Denies CP, SOB, nausea, dizziness at that time with exertion. She has a history of TIA's. She states she can not take a bASA due to fatigue.  She states that she does not want to try any more medications, she understands the risk of stroke/MI.   Lab Results  Component Value Date   HGBA1C 8.3* 03/29/2014   BP Readings from Last 3 Encounters:  06/07/14 140/72  05/02/14 122/64  03/29/14 142/86    Past Medical History  Diagnosis Date  . Diabetes mellitus without complication   . Anxiety   . Hypertension   . Nonalcoholic hepatosteatosis   . Unspecified vitamin D deficiency   . Hyperlipidemia   . Atherosclerosis of aorta     via CXR 2013     Allergies  Allergen Reactions  . Metformin And Related     ELEVATED BS. GLYBURIDE, ONGLYZA, ACTOS, INVOKANA  . Insulins Rash  . Janumet [Sitagliptin-Metformin Hcl] Rash    Shakes/ rash  . Penicillins Rash      Current Outpatient Prescriptions on File Prior to Visit  Medication Sig Dispense Refill  . ALPRAZolam (XANAX) 1 MG tablet 1/2 to 1 tablet 2 to 3 x daily as needed for nerves 90 tablet 3  . lisinopril (PRINIVIL,ZESTRIL) 20 MG tablet Take 1 tablet (20 mg total) by mouth daily. 90 tablet 2  . Multiple Vitamins-Minerals (STRESS TAB NF PO) Take by mouth.    Marland Kitchen OVER THE COUNTER MEDICATION percogesic 4 tabs daily for pain 325 mg/ 12.5 mg     No current facility-administered medications on file prior to visit.    ROS: all negative except above.   Physical Exam: Filed Weights   06/07/14 1106  Weight: 133 lb (60.328 kg)   BP 140/72 mmHg   Pulse 80  Temp(Src) 97.7 F (36.5 C)  Resp 16  Ht 4' 10"  (1.473 m)  Wt 133 lb (60.328 kg)  BMI 27.80 kg/m2 General Appearance: Well nourished, in no apparent distress. Eyes: PERRLA, EOMs, conjunctiva no swelling or erythema Sinuses: No Frontal/maxillary tenderness ENT/Mouth: Ext aud canals clear, TMs without erythema, bulging. No erythema, swelling, or exudate on post pharynx.  Tonsils not swollen or erythematous. Hearing normal.  Neck: Supple, thyroid normal.  Respiratory: Respiratory effort normal, coarse breath sounds, BS equal bilaterally without rales, rhonchi, wheezing or stridor.  Cardio: RRR with no MRGs.   Abdomen: Soft, + BS.  Non tender, no guarding, rebound, hernias, masses. Lymphatics: Non tender without lymphadenopathy.  Musculoskeletal: Full ROM, 4/5 strength, gait antalgic, dragging right leg  Skin: Warm, dry with healing rashes without lesions, ecchymosis.  Neuro: Cranial nerves intact. Normal muscle tone, no cerebellar symptoms.  Psych: Awake and oriented X 3, normal affect, Insight poor and Judgment appropriate.    Assessment and Plan: Uncontrolled diabetes- suggest trying to get back on the glipizide 1/2 pills daily TIA?- suggest adding 1/2 bASA daily or every other day since unable to tolerate daily Fatigue- ? From hyperglycemia, versus MI but patient declines seeing cardio/getting stress test, she is very high risk, patient understands risk of MI, stroke,  death.   There have been some probable medication and dietary compliance issues here. I have discussed with her the great importance of following the treatment plan exactly as directed in order to achieve a good medical outcome. She understands the risk of MI, stroke, cancer and death but declines any work ups or medications.     Vicie Mutters, PA-C 11:32 AM

## 2014-06-08 LAB — SEDIMENTATION RATE: Sed Rate: 47 mm/hr — ABNORMAL HIGH (ref 0–22)

## 2014-06-12 ENCOUNTER — Other Ambulatory Visit: Payer: Self-pay

## 2014-06-12 DIAGNOSIS — I10 Essential (primary) hypertension: Secondary | ICD-10-CM

## 2014-06-12 MED ORDER — GLIMEPIRIDE 4 MG PO TABS: 4.0000 mg | ORAL_TABLET | Freq: Every day | ORAL | Status: DC

## 2014-06-18 ENCOUNTER — Ambulatory Visit: Payer: Self-pay | Admitting: Physician Assistant

## 2014-07-18 ENCOUNTER — Other Ambulatory Visit: Payer: Self-pay | Admitting: Internal Medicine

## 2014-07-18 DIAGNOSIS — F411 Generalized anxiety disorder: Secondary | ICD-10-CM

## 2014-07-18 MED ORDER — ALPRAZOLAM 1 MG PO TABS: ORAL_TABLET | ORAL | Status: DC

## 2014-09-10 ENCOUNTER — Ambulatory Visit: Payer: Self-pay | Admitting: Physician Assistant

## 2014-10-02 ENCOUNTER — Encounter: Payer: Self-pay | Admitting: Internal Medicine

## 2014-10-02 ENCOUNTER — Ambulatory Visit (INDEPENDENT_AMBULATORY_CARE_PROVIDER_SITE_OTHER): Payer: BLUE CROSS/BLUE SHIELD | Admitting: Internal Medicine

## 2014-10-02 VITALS — BP 120/62 | HR 88 | Temp 97.3°F | Resp 16 | Ht <= 58 in | Wt 135.0 lb

## 2014-10-02 DIAGNOSIS — E119 Type 2 diabetes mellitus without complications: Secondary | ICD-10-CM

## 2014-10-02 DIAGNOSIS — E1165 Type 2 diabetes mellitus with hyperglycemia: Secondary | ICD-10-CM

## 2014-10-02 DIAGNOSIS — I150 Renovascular hypertension: Secondary | ICD-10-CM

## 2014-10-02 DIAGNOSIS — E559 Vitamin D deficiency, unspecified: Secondary | ICD-10-CM

## 2014-10-02 DIAGNOSIS — E785 Hyperlipidemia, unspecified: Secondary | ICD-10-CM

## 2014-10-02 DIAGNOSIS — Z79899 Other long term (current) drug therapy: Secondary | ICD-10-CM

## 2014-10-02 NOTE — Progress Notes (Signed)
Patient ID: Sara Lamb, female   DOB: Dec 09, 1950, 64 y.o.   MRN: [id number]   This very nice 64 y.o.MWF presents for 3 month follow up with Hypertension, Hyperlipidemia, T2_NIDDM  and Vitamin D Deficiency.    Patient is treated for HTN since 2000 & BP has been controlled at home. Today's BP: 120/62 mmHg. Patient has had no complaints of any cardiac type chest pain, palpitations, dyspnea/orthopnea/PND, dizziness, claudication, or dependent edema.   Hyperlipidemia is not controlled with diet.  Last Lipids were not at goal - Total Chol  252; HDL 58; LDL  159*; Triglycerides 173 on 03/29/2014.   Also, the patient has history of T2_NIDDM since 2000 and has had poor control consequent to very poor compliance as she attributes any unsatisfying physical sx's to the medicines the she wishes not to take. She infrequently checks her CBG's as she states they usually run in the 300's.  She has had no symptoms of reactive hypoglycemia, diabetic polys, paresthesias or visual blurring.  Last A1c was  8.3% on 03/29/2014.   Further, the patient also has history of Vitamin D Deficiency and supplements vitamin D without any suspected side-effects. Last vitamin D was 24 on  12/28/2013.  Medication Sig  . ALPRAZolam (XANAX) 1 MG tablet 1/2 to 1 tablet 2 to 3 x daily as needed for nerves  . lisinopril (PRINIVIL,ZESTRIL) 20 MG tablet Take 1 tablet (20 mg total) by mouth daily.  . Multiple Vitamins-Minerals (STRESS TAB NF PO) Take by mouth.  Marland Kitchen OVER THE COUNTER MEDICATION percogesic 4 tabs daily for pain 325 mg/ 12.5 mg  . glimepiride (AMARYL) 4 MG tablet Take 1 tablet (4 mg total) by mouth daily with breakfast. (Patient not taking: Reported on 10/02/2014)   Allergies  Allergen Reactions  . Metformin And Related     ELEVATED BS. GLYBURIDE, ONGLYZA, ACTOS, INVOKANA  . Insulins Rash  . Janumet [Sitagliptin-Metformin Hcl] Rash    Shakes/ rash  . Penicillins Rash   PMHx:   Past Medical History  Diagnosis Date  .  Diabetes mellitus without complication   . Anxiety   . Hypertension   . Nonalcoholic hepatosteatosis   . Unspecified vitamin D deficiency   . Hyperlipidemia   . Atherosclerosis of aorta     via CXR 2013   Immunization History  Administered Date(s) Administered  . Td 06/26/2001   Past Surgical History  Procedure Laterality Date  . Abdominal hysterectomy    . Dilation and curettage of uterus     FHx:    Reviewed / unchanged  SHx:    Reviewed / unchanged  Systems Review:  Constitutional: Denies fever, chills, wt changes, headaches, insomnia, fatigue, night sweats, change in appetite. Eyes: Denies redness, blurred vision, diplopia, discharge, itchy, watery eyes.  ENT: Denies discharge, congestion, post nasal drip, epistaxis, sore throat, earache, hearing loss, dental pain, tinnitus, vertigo, sinus pain, snoring.  CV: Denies chest pain, palpitations, irregular heartbeat, syncope, dyspnea, diaphoresis, orthopnea, PND, claudication or edema. Respiratory: denies cough, dyspnea, DOE, pleurisy, hoarseness, laryngitis, wheezing.  Gastrointestinal: Denies dysphagia, odynophagia, heartburn, reflux, water brash, abdominal pain or cramps, nausea, vomiting, bloating, diarrhea, constipation, hematemesis, melena, hematochezia  or hemorrhoids. Genitourinary: Denies dysuria, frequency, urgency, nocturia, hesitancy, discharge, hematuria or flank pain. Musculoskeletal: Denies arthralgias, myalgias, stiffness, jt. swelling, pain, limping or strain/sprain.  Skin: Denies pruritus, rash, hives, warts, acne, eczema or change in skin lesion(s). Neuro: No weakness, tremor, incoordination, spasms, paresthesia or pain. Psychiatric: Denies confusion, memory loss or sensory loss. Endo: Denies  change in weight, skin or hair change.  Heme/Lymph: No excessive bleeding, bruising or enlarged lymph nodes.  Physical Exam  BP 120/62   Pulse 88  Temp 97.3 F   Resp 16  Ht 4' 10"    Wt 135 lb     BMI  28.22  Appears well nourished and in no distress. Eyes: PERRLA, EOMs, conjunctiva no swelling or erythema. Sinuses: No frontal/maxillary tenderness ENT/Mouth: EAC's clear, TM's nl w/o erythema, bulging. Nares clear w/o erythema, swelling, exudates. Oropharynx clear without erythema or exudates. Oral hygiene is good. Tongue normal, non obstructing. Hearing intact.  Neck: Supple. Thyroid nl. Car 2+/2+ without bruits, nodes or JVD. Chest: Respirations nl with BS clear & equal w/o rales, rhonchi, wheezing or stridor.  Cor: Heart sounds normal w/ regular rate and rhythm without sig. murmurs, gallops, clicks, or rubs. Peripheral pulses normal and equal  without edema.  Abdomen: Soft & bowel sounds normal. Non-tender w/o guarding, rebound, hernias, masses, or organomegaly.  Lymphatics: Unremarkable.  Musculoskeletal: Full ROM all peripheral extremities, joint stability, 5/5 strength, and normal gait.  Skin: Warm, dry without exposed rashes, lesions or ecchymosis apparent.  Neuro: Cranial nerves intact, reflexes equal bilaterally. Sensory-motor testing grossly intact. Tendon reflexes grossly intact.  Pysch: Alert & oriented x 3.  Insight and judgement nl & appropriate. No ideations.  Assessment and Plan:  1. Renovascular hypertension  - TSH  2. Hyperlipidemia  - Lipid panel  3. T2_NIDDM, poorly controlled  - patient has poor understanding of the levity of her diabetes, as well a denial of the consequences of poor control that have been related to her on numerous occasions.  - Hemoglobin A1c - Insulin, fasting  -Patient sx's to start Januvia 100 mg x 1/2 tab and Farxiga 210 mg x 1/2 tab - each daily til return in 1 month . Strongly encouraged CBG monitoring.  4. Vitamin D deficiency  - Vit D  25 hydroxy (rtn osteoporosis monitoring)  5. Medication management  - CBC with Differential/Platelet - BASIC METABOLIC PANEL WITH GFR - Hepatic function panel - Magnesium   Recommended  regular exercise, BP monitoring, weight control, and discussed med and SE's. Recommended labs to assess and monitor clinical status. Further disposition pending results of labs.

## 2014-10-02 NOTE — Patient Instructions (Signed)
   Start Farxiga 10 mg x 1/2 tablet = 5 mg daily   Start Januvia 100 mg x 1/2 tablet = 50 mg daily

## 2014-10-03 LAB — CBC WITH DIFFERENTIAL/PLATELET
Basophils Absolute: 0.1 10*3/uL (ref 0.0–0.1)
Basophils Relative: 1 % (ref 0–1)
EOS PCT: 5 % (ref 0–5)
Eosinophils Absolute: 0.4 10*3/uL (ref 0.0–0.7)
HCT: 46.2 % — ABNORMAL HIGH (ref 36.0–46.0)
HEMOGLOBIN: 15.5 g/dL — AB (ref 12.0–15.0)
Lymphocytes Relative: 35 % (ref 12–46)
Lymphs Abs: 2.7 10*3/uL (ref 0.7–4.0)
MCH: 29.8 pg (ref 26.0–34.0)
MCHC: 33.5 g/dL (ref 30.0–36.0)
MCV: 88.8 fL (ref 78.0–100.0)
MONOS PCT: 6 % (ref 3–12)
MPV: 10 fL (ref 8.6–12.4)
Monocytes Absolute: 0.5 10*3/uL (ref 0.1–1.0)
NEUTROS PCT: 53 % (ref 43–77)
Neutro Abs: 4.1 10*3/uL (ref 1.7–7.7)
Platelets: 281 10*3/uL (ref 150–400)
RBC: 5.2 MIL/uL — AB (ref 3.87–5.11)
RDW: 12.9 % (ref 11.5–15.5)
WBC: 7.8 10*3/uL (ref 4.0–10.5)

## 2014-10-03 LAB — LIPID PANEL
CHOL/HDL RATIO: 6.3 ratio
CHOLESTEROL: 252 mg/dL — AB (ref 0–200)
HDL: 40 mg/dL — AB (ref >=46)
LDL Cholesterol: 158 mg/dL — ABNORMAL HIGH (ref 0–99)
TRIGLYCERIDES: 268 mg/dL — AB (ref <150)
VLDL: 54 mg/dL — ABNORMAL HIGH (ref 0–40)

## 2014-10-03 LAB — BASIC METABOLIC PANEL WITH GFR
BUN: 10 mg/dL (ref 6–23)
CALCIUM: 9.6 mg/dL (ref 8.4–10.5)
CHLORIDE: 93 meq/L — AB (ref 96–112)
CO2: 26 mEq/L (ref 19–32)
CREATININE: 0.63 mg/dL (ref 0.50–1.10)
GFR, Est African American: >89 mL/min
GFR, Est Non African American: >89 mL/min
GLUCOSE: 328 mg/dL — AB (ref 70–99)
Potassium: 4.5 mEq/L (ref 3.5–5.3)
Sodium: 132 mEq/L — ABNORMAL LOW (ref 135–145)

## 2014-10-03 LAB — HEPATIC FUNCTION PANEL
ALBUMIN: 4 g/dL (ref 3.5–5.2)
ALK PHOS: 117 U/L (ref 39–117)
ALT: 30 U/L (ref 0–35)
AST: 27 U/L (ref 0–37)
BILIRUBIN DIRECT: 0.1 mg/dL (ref 0.0–0.3)
BILIRUBIN TOTAL: 0.4 mg/dL (ref 0.2–1.2)
Indirect Bilirubin: 0.3 mg/dL (ref 0.2–1.2)
TOTAL PROTEIN: 7.3 g/dL (ref 6.0–8.3)

## 2014-10-03 LAB — MAGNESIUM: Magnesium: 1.9 mg/dL (ref 1.5–2.5)

## 2014-10-03 LAB — VITAMIN D 25 HYDROXY (VIT D DEFICIENCY, FRACTURES): Vit D, 25-Hydroxy: 7 ng/mL — ABNORMAL LOW (ref 30–100)

## 2014-10-03 LAB — HEMOGLOBIN A1C
Hgb A1c MFr Bld: 11.2 % — ABNORMAL HIGH
Mean Plasma Glucose: 275 mg/dL — ABNORMAL HIGH

## 2014-10-03 LAB — INSULIN, FASTING: INSULIN FASTING, SERUM: 20.6 u[IU]/mL — AB (ref 2.0–19.6)

## 2014-10-03 LAB — TSH: TSH: 0.647 u[IU]/mL (ref 0.350–4.500)

## 2014-10-08 ENCOUNTER — Encounter: Payer: Self-pay | Admitting: *Deleted

## 2014-11-05 ENCOUNTER — Encounter: Payer: Self-pay | Admitting: Internal Medicine

## 2014-11-05 ENCOUNTER — Ambulatory Visit (INDEPENDENT_AMBULATORY_CARE_PROVIDER_SITE_OTHER): Payer: BLUE CROSS/BLUE SHIELD | Admitting: Internal Medicine

## 2014-11-05 VITALS — BP 124/68 | HR 84 | Temp 97.6°F | Resp 16 | Ht <= 58 in | Wt 133.8 lb

## 2014-11-05 DIAGNOSIS — I150 Renovascular hypertension: Secondary | ICD-10-CM

## 2014-11-05 DIAGNOSIS — E119 Type 2 diabetes mellitus without complications: Secondary | ICD-10-CM

## 2014-11-05 DIAGNOSIS — Z9114 Patient's other noncompliance with medication regimen: Secondary | ICD-10-CM

## 2014-11-05 DIAGNOSIS — E1165 Type 2 diabetes mellitus with hyperglycemia: Secondary | ICD-10-CM

## 2014-11-05 MED ORDER — CANAGLIFLOZIN 300 MG PO TABS: 300.0000 mg | ORAL_TABLET | Freq: Every day | ORAL | Status: AC

## 2014-11-05 MED ORDER — SAXAGLIPTIN HCL 5 MG PO TABS: ORAL_TABLET | ORAL | Status: DC

## 2014-11-05 MED ORDER — LISINOPRIL 20 MG PO TABS: 20.0000 mg | ORAL_TABLET | Freq: Every day | ORAL | Status: DC

## 2014-11-05 NOTE — Progress Notes (Signed)
Subjective:    Patient ID: Sara Lamb, female    DOB: 22-Sep-1950, 64 y.o.   MRN: [id number]  HPI 64 yo MWF with poorly controlled T2DM consequent of her misperceptions and poor understanding of DM and the significance of control and her tendency to attribute any and all unpleasant sx's to her medications in the past. She was recently started on 1/2 doses of [place] with marginal improvement in her diabetic control and surprisingly she recounts no intolerant SE's to the medications. She is somewhat frustrated regarding knee pains that she's seen an orthopedist for and had MRI and suparz injections. WRT her DM, she denies any hypoglycemic rxn's , diabetic polys or paresthesias and list of CBG's range betw 150-250 mg%.   Medication Sig  . ALPRAZolam (XANAX) 1 MG tablet 1/2 to 1 tablet 2 to 3 x daily as needed for nerves  . STRESS TAB  Take by mouth.  . percogesic  4 tabs daily for pain 325 mg/ 12.5 mg  . lisinopril  20 MG tablet Take 1 tablet (20 mg total) by mouth daily.  .  [id number] MG per tablet   . meloxicam  7.5 MG tablet    Currently also on Januvia 100 x 1/2 tab & Farxiga 10 x 1/2 tab   Allergies  Allergen Reactions  . Metformin And Related     ELEVATED BS. GLYBURIDE, ONGLYZA, ACTOS, INVOKANA  . Insulins Rash  . Janumet [Sitagliptin-Metformin Hcl] Rash    Shakes/ rash  . Penicillins Rash   Review of Systems In addition to the HPI above,  No Fever-chills,  No Headache, No changes with Vision or hearing,  No problems swallowing food or Liquids,  No Chest pain or productive Cough or Shortness of Breath,  No Abdominal pain, No Nausea or Vomitting, Bowel movements are regular,  No Blood in stool or Urine,  No dysuria,  No new skin rashes or bruises,  No new weakness, tingling, numbness in any extremity,  No recent weight loss,  No polyuria, polydypsia or polyphagia,  No significant Mental Stressors.  A full 10 point Review of Systems was done, except as stated  above, all other Review of Systems were negative    Objective:   Physical Exam  BP 124/68 mmHg  Pulse 84  Temp(Src) 97.6 F (36.4 C)  Resp 16  Ht 4' 10"  (1.473 m)  Wt 133 lb 12.8 oz (60.691 kg)  BMI 27.97 kg/m2  HEENT - Eac's patent. TM's Nl. EOM's full. PERRLA. NasoOroPharynx clear. Neck - supple. Nl Thyroid. Carotids 2+ & No bruits, nodes, JVD Chest - Clear equal BS w/o Rales, rhonchi, wheezes. Cor - Nl HS. RRR w/o sig MGR. PP 1(+). No edema. Abd - No palpable organomegaly, masses or tenderness. BS nl. MS- FROM w/o deformities. Muscle power, tone and bulk Nl. Gait Nl. Neuro - No obvious Cr N abnormalities. Sensory, motor and Cerebellar functions appear Nl w/o focal abnormalities. Psyche - Mental status normal & appropriate.  No delusions, ideations or obvious mood abnormalities.    Assessment & Plan:   1. Renovascular hypertension  - lisinopril (PRINIVIL,ZESTRIL) 20 MG tablet; Take 1 tablet (20 mg total) by mouth daily.  Dispense: 90 tablet; Refill: 0  2. T2_NIDDM, poorly controlled  - lisinopril (PRINIVIL,ZESTRIL) 20 MG tablet; Take 1 tablet (20 mg total) by mouth daily.  Dispense: 90 tablet; Refill: 0  SGLT2 and DPP4's below switched for formulary   - saxagliptin HCl (ONGLYZA) 5 MG TABS tablet; Take 1  tablet daily for Diabetes  Dispense: 30 tablet; Refill: 3 - canagliflozin (INVOKANA) 300 MG TABS tablet; Take 300 mg by mouth daily before breakfast.  Dispense: 30 tablet; Refill: 3  - ROV 2 mo or prn  - Discussed Meds/SE's.

## 2014-11-05 NOTE — Patient Instructions (Signed)
   Recommend the book "The END of DIETING" by Dr Excell Seltzer   & the book "The END of DIABETES " by Dr Excell Seltzer  At [email or web address] - get book & Audio CD's      Being diabetic has a  300% increased risk for heart attack, stroke, cancer, and alzheimer- type vascular dementia. It is very important that you work harder with diet by avoiding all foods that are white. Avoid white rice (brown & wild rice is OK), white potatoes (sweetpotatoes in moderation is OK), White bread or wheat bread or anything made out of white flour like bagels, donuts, rolls, buns, biscuits, cakes, pastries, cookies, pizza crust, and pasta (made from white flour & egg whites) - vegetarian pasta or spinach or wheat pasta is OK. Multigrain breads like Arnold's or Pepperidge Farm, or multigrain sandwich thins or flatbreads.  Diet, exercise and weight loss can reverse and cure diabetes in the early stages.  Diet, exercise and weight loss is very important in the control and prevention of complications of diabetes which affects every system in your body, ie. Brain - dementia/stroke, eyes - glaucoma/blindness, heart - heart attack/heart failure, kidneys - dialysis, stomach - gastric paralysis, intestines - malabsorption, nerves - severe painful neuritis, circulation - gangrene & loss of a leg(s), and finally cancer and Alzheimers.    I recommend avoid fried & greasy foods,  sweets/candy, white rice (brown or wild rice or Quinoa is OK), white potatoes (sweet potatoes are OK) - anything made from white flour - bagels, doughnuts, rolls, buns, biscuits,white and wheat breads, pizza crust and traditional pasta made of white flour & egg white(vegetarian pasta or spinach or wheat pasta is OK).  Multi-grain bread is OK - like multi-grain flat bread or sandwich thins. Avoid alcohol in excess. Exercise is also important.    Eat all the vegetables you want - avoid meat, especially red meat and dairy - especially cheese.  Cheese is the most  concentrated form of trans-fats which is the worst thing to clog up our arteries. Veggie cheese is OK which can be found in the fresh produce section at [facility] or [employer] or [employer]

## 2014-11-25 HISTORY — PX: KNEE ARTHROSCOPY: SUR90

## 2014-12-28 ENCOUNTER — Other Ambulatory Visit: Payer: Self-pay | Admitting: Internal Medicine

## 2014-12-28 DIAGNOSIS — F411 Generalized anxiety disorder: Secondary | ICD-10-CM

## 2014-12-28 MED ORDER — ALPRAZOLAM 1 MG PO TABS: ORAL_TABLET | ORAL | Status: DC

## 2015-01-10 ENCOUNTER — Encounter: Payer: Self-pay | Admitting: Internal Medicine

## 2015-01-10 ENCOUNTER — Ambulatory Visit (INDEPENDENT_AMBULATORY_CARE_PROVIDER_SITE_OTHER): Payer: BLUE CROSS/BLUE SHIELD | Admitting: Internal Medicine

## 2015-01-10 VITALS — BP 148/66 | HR 84 | Temp 97.5°F | Resp 16 | Ht <= 58 in | Wt 137.6 lb

## 2015-01-10 DIAGNOSIS — Z91148 Patient's other noncompliance with medication regimen for other reason: Secondary | ICD-10-CM

## 2015-01-10 DIAGNOSIS — E1165 Type 2 diabetes mellitus with hyperglycemia: Secondary | ICD-10-CM

## 2015-01-10 DIAGNOSIS — Z79899 Other long term (current) drug therapy: Secondary | ICD-10-CM

## 2015-01-10 DIAGNOSIS — Z6828 Body mass index (BMI) 28.0-28.9, adult: Secondary | ICD-10-CM

## 2015-01-10 DIAGNOSIS — I10 Essential (primary) hypertension: Secondary | ICD-10-CM

## 2015-01-10 DIAGNOSIS — E559 Vitamin D deficiency, unspecified: Secondary | ICD-10-CM

## 2015-01-10 DIAGNOSIS — Z9114 Patient's other noncompliance with medication regimen: Secondary | ICD-10-CM

## 2015-01-10 DIAGNOSIS — E785 Hyperlipidemia, unspecified: Secondary | ICD-10-CM

## 2015-01-10 DIAGNOSIS — E119 Type 2 diabetes mellitus without complications: Secondary | ICD-10-CM

## 2015-01-10 LAB — HEPATIC FUNCTION PANEL
ALK PHOS: 134 U/L — AB (ref 39–117)
ALT: 22 U/L (ref 0–35)
AST: 19 U/L (ref 0–37)
Albumin: 4 g/dL (ref 3.5–5.2)
BILIRUBIN DIRECT: 0.1 mg/dL (ref 0.0–0.3)
Indirect Bilirubin: 0.3 mg/dL (ref 0.2–1.2)
Total Bilirubin: 0.4 mg/dL (ref 0.2–1.2)
Total Protein: 7.3 g/dL (ref 6.0–8.3)

## 2015-01-10 LAB — BASIC METABOLIC PANEL WITH GFR
BUN: 10 mg/dL (ref 6–23)
CHLORIDE: 89 meq/L — AB (ref 96–112)
CO2: 28 mEq/L (ref 19–32)
Calcium: 9.6 mg/dL (ref 8.4–10.5)
Creat: 0.72 mg/dL (ref 0.50–1.10)
GFR, EST NON AFRICAN AMERICAN: 89 mL/min
GFR, Est African American: >89 mL/min
Glucose, Bld: 246 mg/dL — ABNORMAL HIGH (ref 70–99)
POTASSIUM: 3.8 meq/L (ref 3.5–5.3)
SODIUM: 127 meq/L — AB (ref 135–145)

## 2015-01-10 LAB — LIPID PANEL
CHOL/HDL RATIO: 5.8 ratio
Cholesterol: 215 mg/dL — ABNORMAL HIGH (ref 0–200)
HDL: 37 mg/dL — AB (ref >=46)
LDL Cholesterol: 109 mg/dL — ABNORMAL HIGH (ref 0–99)
Triglycerides: 346 mg/dL — ABNORMAL HIGH (ref <150)
VLDL: 69 mg/dL — ABNORMAL HIGH (ref 0–40)

## 2015-01-10 LAB — MAGNESIUM: Magnesium: 1.6 mg/dL (ref 1.5–2.5)

## 2015-01-10 NOTE — Progress Notes (Signed)
Patient ID: Sara Lamb, female   DOB: September 11, 1950, 64 y.o.   MRN: [id number]   This very nice 64 y.o. MWF presents for 3 month follow up with Hypertension, Hyperlipidemia, T2_NIDDM and Vitamin D Deficiency. Patient is cognitively challenged and is poorly compliant with most all meds that she's been prescribed in the past and has poor insight into the consequences of her poor compliance despite multiple attempts to educate her. It is never certain what she's taking or not and she also frequently changes meds and discontinues meds w/o consulting this office.   Patient is treated for HTN circa 2000 & BP has been controlled at home. Today's BP: (!) 148/66 mmHg. Patient has had no complaints of any cardiac type chest pain, palpitations, dyspnea/orthopnea/PND, dizziness, claudication, or dependent edema.   Hyperlipidemia is controlled with diet & meds. Patient denies myalgias or other med SE's. Last Lipids were not controlled at elevated Chol 252; HDL 40*; elevated LDL  158*; and also elevated Trig 268 on 10/02/2014.   Also, the patient has history of T2_NIDDM circa 2000 and has had no symptoms of reactive hypoglycemia, diabetic polys, paresthesias or visual blurring.  Last A1c was 11.2% on 10/02/2014. Patient had been supplied both [place] and she also self discontinued both medicines and resumed taking an old Rx of glimepiride.    Further, the patient also has history of Vitamin D Deficiency and will not supplement vitamin D as recommended. Last vitamin D was extremely low at  "7" on  10/02/2014  Medication Sig  . ALPRAZolam (XANAX) 1 MG tablet 1/2 to 1 tablet 2 to 3 x daily as needed for nerves  . furosemide (LASIX) 40 MG tablet   . glimepiride (AMARYL) 4 MG tablet Take 1 tablet (4 mg total) by mouth daily with breakfast.  . lisinopril 20 MG tablet Take 1 tablet (20 mg total) by mouth daily.  . STRESS TAB  Take by mouth.  . percogesic  4 tabs daily for pain 325 mg/ 12.5 mg  . canagliflozin  (INVOKANA) 300 MG  Take 300 mg by mouth daily before breakfast -- Patient not taking  . saxagliptin HCl (ONGLYZA) 5 MG  Take 1 tablet daily for Diabetes -- Patient not taking   Allergies  Allergen Reactions  . Metformin And Related     ELEVATED BS. GLYBURIDE, ONGLYZA, ACTOS, INVOKANA  . Insulins Rash  . Janumet [Sitagliptin-Metformin Hcl] Rash    Shakes/ rash  . Penicillins Rash   PMHx:   Past Medical History  Diagnosis Date  . Diabetes mellitus without complication   . Anxiety   . Hypertension   . Nonalcoholic hepatosteatosis   . Unspecified vitamin D deficiency   . Hyperlipidemia   . Atherosclerosis of aorta     via CXR 2013   Immunization History  Administered Date(s) Administered  . Td 06/26/2001   Past Surgical History  Procedure Laterality Date  . Abdominal hysterectomy    . Dilation and curettage of uterus     FHx:    Reviewed / unchanged  SHx:    Reviewed / unchanged  Systems Review:  Constitutional: Denies fever, chills, wt changes, headaches, insomnia, fatigue, night sweats, change in appetite. Eyes: Denies redness, blurred vision, diplopia, discharge, itchy, watery eyes.  ENT: Denies discharge, congestion, post nasal drip, epistaxis, sore throat, earache, hearing loss, dental pain, tinnitus, vertigo, sinus pain, snoring.  CV: Denies chest pain, palpitations, irregular heartbeat, syncope, dyspnea, diaphoresis, orthopnea, PND, claudication or edema. Respiratory: denies cough, dyspnea,  DOE, pleurisy, hoarseness, laryngitis, wheezing.  Gastrointestinal: Denies dysphagia, odynophagia, heartburn, reflux, water brash, abdominal pain or cramps, nausea, vomiting, bloating, diarrhea, constipation, hematemesis, melena, hematochezia  or hemorrhoids. Genitourinary: Denies dysuria, frequency, urgency, nocturia, hesitancy, discharge, hematuria or flank pain. Musculoskeletal: Denies arthralgias, myalgias, stiffness, jt. swelling, pain, limping or strain/sprain.  Skin: Denies  pruritus, rash, hives, warts, acne, eczema or change in skin lesion(s). Neuro: No weakness, tremor, incoordination, spasms, paresthesia or pain. Psychiatric: Denies confusion, memory loss or sensory loss. Endo: Denies change in weight, skin or hair change.  Heme/Lymph: No excessive bleeding, bruising or enlarged lymph nodes.  Physical Exam  BP 148/66   Pulse 84  Temp 97.5 F Resp 16  Ht 4' 10"    Wt 137 lb 9.6 oz   BMI 28.77   Appears well nourished and in no distress. Eyes: PERRLA, EOMs, conjunctiva no swelling or erythema. Sinuses: No frontal/maxillary tenderness ENT/Mouth: EAC's clear, TM's nl w/o erythema, bulging. Nares clear w/o erythema, swelling, exudates. Oropharynx clear without erythema or exudates. Oral hygiene is good. Tongue normal, non obstructing. Hearing intact.  Neck: Supple. Thyroid nl. Car 2+/2+ without bruits, nodes or JVD. Chest: Respirations nl with BS clear & equal w/o rales, rhonchi, wheezing or stridor.  Cor: Heart sounds normal w/ regular rate and rhythm without sig. murmurs, gallops, clicks, or rubs. Peripheral pulses normal and equal  without edema.  Abdomen: Soft & bowel sounds normal. Non-tender w/o guarding, rebound, hernias, masses, or organomegaly.  Lymphatics: Unremarkable.  Musculoskeletal: Full ROM all peripheral extremities, joint stability, 5/5 strength, and normal gait.  Skin: Warm, dry without exposed rashes, lesions or ecchymosis apparent.  Neuro: Cranial nerves intact, reflexes equal bilaterally. Sensory-motor testing grossly intact. Tendon reflexes grossly intact.  Pysch: Alert & oriented x 3.  Insight and judgement nl & appropriate. No ideations.  Assessment and Plan:  1. Essential hypertension  - TSH  2. Hyperlipidemia  - Lipid panel  3. T2_NIDDM, poorly controlled  - Hemoglobin A1c - Insulin, random  4. Vitamin D deficiency  - Vit D  25 hydroxy (rtn osteoporosis monitoring)  5. Poor compliance with medication   6.  Medication management  - CBC with Differential/Platelet - BASIC METABOLIC PANEL WITH GFR - Hepatic function panel - Magnesium  7. Adult BMI 28.0-28.9 kg/sq m   Recommended regular exercise, BP monitoring, weight control, and discussed med and SE's. Recommended labs to assess and monitor clinical status. Further disposition pending results of labs. Over 30 minutes of exam, counseling, chart review was performed

## 2015-01-10 NOTE — Patient Instructions (Signed)
  Recommend Adult Low dose Aspirin or baby Aspirin 81 mg daily   To reduce risk of Colon Cancer 20 %,   Skin Cancer 26 % ,   Melanoma 46%   and   Pancreatic cancer 60%  ++++++++++++++++++  Vitamin D goal is between 70-100.   Please make sure that you are taking your Vitamin D as directed.   It is very important as a natural anti-inflammatory   helping hair, skin, and nails, as well as reducing stroke and heart attack risk.   It helps your bones and helps with mood.  It also decreases numerous cancer risks so please take it as directed.   Low Vit D is associated with a 200-300% higher risk for CANCER   and 200-300% higher risk for HEART   ATTACK  &  STROKE.    .....................................Marland Kitchen  It is also associated with higher death rate at younger ages,   autoimmune diseases like Rheumatoid arthritis, Lupus, Multiple Sclerosis.     Also many other serious conditions, like depression, Alzheimer's  Dementia, infertility, muscle aches, fatigue, fibromyalgia - just to name a few.  +++++++++++++++++++    Recommend the book "The END of DIETING" by Dr Excell Seltzer   & the book "The END of DIABETES " by Dr Excell Seltzer  At [email or web address] - get book & Audio CD's     Being diabetic has a  300% increased risk for heart attack, stroke, cancer, and alzheimer- type vascular dementia. It is very important that you work harder with diet by avoiding all foods that are white. Avoid white rice (brown & wild rice is OK), white potatoes (sweetpotatoes in moderation is OK), White bread or wheat bread or anything made out of white flour like bagels, donuts, rolls, buns, biscuits, cakes, pastries, cookies, pizza crust, and pasta (made from white flour & egg whites) - vegetarian pasta or spinach or wheat pasta is OK. Multigrain breads like Arnold's or Pepperidge [employer], or multigrain sandwich thins or flatbreads.  Diet, exercise and weight loss can reverse and cure diabetes in the early  stages.  Diet, exercise and weight loss is very important in the control and prevention of complications of diabetes which affects every system in your body, ie. Brain - dementia/stroke, eyes - glaucoma/blindness, heart - heart attack/heart failure, kidneys - dialysis, stomach - gastric paralysis, intestines - malabsorption, nerves - severe painful neuritis, circulation - gangrene & loss of a leg(s), and finally cancer and Alzheimers.    I recommend avoid fried & greasy foods,  sweets/candy, white rice (brown or wild rice or Quinoa is OK), white potatoes (sweet potatoes are OK) - anything made from white flour - bagels, doughnuts, rolls, buns, biscuits,white and wheat breads, pizza crust and traditional pasta made of white flour & egg white(vegetarian pasta or spinach or wheat pasta is OK).  Multi-grain bread is OK - like multi-grain flat bread or sandwich thins. Avoid alcohol in excess. Exercise is also important.    Eat all the vegetables you want - avoid meat, especially red meat and dairy - especially cheese.  Cheese is the most concentrated form of trans-fats which is the worst thing to clog up our arteries. Veggie cheese is OK which can be found in the fresh produce section at [facility] or [employer] or [employer]  ++++++++++++++++++++++++++ .

## 2015-01-11 LAB — CBC WITH DIFFERENTIAL/PLATELET
BASOS ABS: 0.1 10*3/uL (ref 0.0–0.1)
BASOS PCT: 1 % (ref 0–1)
Eosinophils Absolute: 0.2 10*3/uL (ref 0.0–0.7)
Eosinophils Relative: 2 % (ref 0–5)
HEMATOCRIT: 42 % (ref 36.0–46.0)
HEMOGLOBIN: 14.1 g/dL (ref 12.0–15.0)
LYMPHS PCT: 17 % (ref 12–46)
Lymphs Abs: 2 10*3/uL (ref 0.7–4.0)
MCH: 29.6 pg (ref 26.0–34.0)
MCHC: 33.6 g/dL (ref 30.0–36.0)
MCV: 88.2 fL (ref 78.0–100.0)
MONO ABS: 0.8 10*3/uL (ref 0.1–1.0)
MONOS PCT: 7 % (ref 3–12)
MPV: 9.4 fL (ref 8.6–12.4)
Neutro Abs: 8.5 10*3/uL — ABNORMAL HIGH (ref 1.7–7.7)
Neutrophils Relative %: 73 % (ref 43–77)
Platelets: 328 10*3/uL (ref 150–400)
RBC: 4.76 MIL/uL (ref 3.87–5.11)
RDW: 13.1 % (ref 11.5–15.5)
WBC: 11.7 10*3/uL — ABNORMAL HIGH (ref 4.0–10.5)

## 2015-01-11 LAB — HEMOGLOBIN A1C
HEMOGLOBIN A1C: 8.8 % — AB (ref <5.7)
Mean Plasma Glucose: 206 mg/dL — ABNORMAL HIGH (ref <117)

## 2015-01-11 LAB — INSULIN, RANDOM: Insulin: 64.5 u[IU]/mL — ABNORMAL HIGH (ref 2.0–19.6)

## 2015-01-11 LAB — VITAMIN D 25 HYDROXY (VIT D DEFICIENCY, FRACTURES): Vit D, 25-Hydroxy: 9 ng/mL — ABNORMAL LOW (ref 30–100)

## 2015-01-11 LAB — TSH: TSH: 0.554 u[IU]/mL (ref 0.350–4.500)

## 2015-02-04 ENCOUNTER — Encounter: Payer: Self-pay | Admitting: Physician Assistant

## 2015-03-13 ENCOUNTER — Other Ambulatory Visit: Payer: Self-pay | Admitting: Internal Medicine

## 2015-04-08 ENCOUNTER — Ambulatory Visit (INDEPENDENT_AMBULATORY_CARE_PROVIDER_SITE_OTHER): Payer: BLUE CROSS/BLUE SHIELD | Admitting: Physician Assistant

## 2015-04-08 ENCOUNTER — Encounter: Payer: Self-pay | Admitting: Physician Assistant

## 2015-04-08 ENCOUNTER — Ambulatory Visit (HOSPITAL_COMMUNITY)
Admission: RE | Admit: 2015-04-08 | Discharge: 2015-04-08 | Disposition: A | Discharge status: Home / Self Care | Payer: BLUE CROSS/BLUE SHIELD | Source: Ambulatory Visit | Attending: Physician Assistant | Admitting: Physician Assistant

## 2015-04-08 VITALS — BP 120/60 | HR 64 | Temp 97.3°F | Resp 16 | Ht <= 58 in | Wt 140.6 lb

## 2015-04-08 DIAGNOSIS — R05 Cough: Secondary | ICD-10-CM | POA: Insufficient documentation

## 2015-04-08 DIAGNOSIS — Z87891 Personal history of nicotine dependence: Secondary | ICD-10-CM | POA: Diagnosis not present

## 2015-04-08 DIAGNOSIS — R059 Cough, unspecified: Secondary | ICD-10-CM

## 2015-04-08 DIAGNOSIS — M546 Pain in thoracic spine: Secondary | ICD-10-CM

## 2015-04-08 DIAGNOSIS — Z720 Tobacco use: Secondary | ICD-10-CM

## 2015-04-08 DIAGNOSIS — M439 Deforming dorsopathy, unspecified: Secondary | ICD-10-CM | POA: Diagnosis not present

## 2015-04-08 DIAGNOSIS — M549 Dorsalgia, unspecified: Secondary | ICD-10-CM | POA: Diagnosis not present

## 2015-04-08 DIAGNOSIS — F17200 Nicotine dependence, unspecified, uncomplicated: Secondary | ICD-10-CM

## 2015-04-08 MED ORDER — AZITHROMYCIN 250 MG PO TABS: ORAL_TABLET | ORAL | Status: AC

## 2015-04-08 MED ORDER — PREDNISONE 20 MG PO TABS: ORAL_TABLET | ORAL | Status: DC

## 2015-04-08 NOTE — Patient Instructions (Signed)
I will give you a prescription for an antibiotic, but please only take it if you are not feeling better in 7-10 days.  Bronchitis is mostly caused by viruses and the antibiotic will do nothing.  PLEASE TRY TO DO OVER THE COUNTER TREATMENT AND/OR PREDNISONE FOR 5-7 DAYS AND IF YOU ARE NOT GETTING BETTER OR GETTING WORSE THEN YOU CAN START ON AN ANTIBIOTIC GIVEN.  Can take the prednisone AT NIGHT WITH DINNER, it take 8-12 hours to start working so it will NOT affect your sleeping if you take it at night with your food!! Take two pills the first night and 1 or two pill the second night and then 1 pill the other nights.    Rest and stay hydrated.  Make sure you drink plenty of fluids to make sure urine is clear when you urinate.  Water will help thin out mucous. - Take Mucinex DM- Maximum Strength over the counter to thin out and cough up the thick mucous.  Please follow directions on box. -Take Albuterol if prescribed.  Risk of antibiotic use: About 1 in 4 people who take antibiotics have side effects including stomach problems, dizziness, or rashes. Those problems clear up soon after stopping the drugs, but in rare cases antibiotics can cause severe allergic reaction. Over use of antibiotics also encourages the growth of bacteria that can't be controlled easily with drugs. That makes you more vunerable to antibiotic-resistant infections and undermines the benefits of antibiotics for others.   Waste of Money: Antibiotics often aren't very expensive, but any money spent on unnecessary drugs is money down the drain.   When are antibiotics needed? Only when symptoms last longer than a week.  Start to improve but then worsen again  Please call the office or message through My Chart if you have any questions.   Acute Bronchitis Bronchitis is when the airways that extend from the windpipe into the lungs get red, puffy, and painful (inflamed). Bronchitis often causes thick spit (mucus) to develop. This  leads to a cough. A cough is the most common symptom of bronchitis. In acute bronchitis, the condition usually begins suddenly and goes away over time (usually in 2 weeks). Smoking, allergies, and asthma can make bronchitis worse. Repeated episodes of bronchitis may cause more lung problems.  Most common cause of Bronchitis is viruses (rhinovirus, coronavirus, RSV).  Therefore, not requiring an antibiotic; as antibiotics only treat bacterial infections.  HOME CARE  Rest.  Drink enough fluids to keep your pee (urine) clear or pale yellow (unless you need to limit fluids as told by your doctor).  Only take over-the-counter or prescription medicines as told by your doctor.  Avoid smoking and secondhand smoke. These can make bronchitis worse. If you are a smoker, think about using nicotine gum or skin patches. Quitting smoking will help your lungs heal faster.  Reduce the chance of getting bronchitis again by:  Washing your hands often.  Avoiding people with cold symptoms.  Trying not to touch your hands to your mouth, nose, or eyes.  Follow up with your doctor as told. GET HELP IF: Your symptoms do not improve after 1 week of treatment. Symptoms include:  Cough.  Fever.  Coughing up thick spit.  Body aches.  Chest congestion.  Chills.  Shortness of breath.  Sore throat. GET HELP RIGHT AWAY IF:   You have an increased fever.  You have chills.  You have severe shortness of breath.  You have bloody thick spit (sputum).  You throw up (vomit) often.  You lose too much body fluid (dehydration).  You have a severe headache.  You faint. MAKE SURE YOU:   Understand these instructions.  Will watch your condition.  Will get help right away if you are not doing well or get worse. Document Released: 12/30/2007 Document Revised: 03/15/2013 Document Reviewed: 01/03/2013 [facility] Patient Information 2015 [place], [place]. This information is not intended to replace  advice given to you by your health care provider. Make sure you discuss any questions you have with your health care provider.

## 2015-04-08 NOTE — Progress Notes (Signed)
   Subjective:    Patient ID: Sara Lamb, female    DOB: May 10, 1951, 64 y.o.   MRN: [id number]  HPI 64 y.o. smoking WF with history of TIA, uncontrolled DM II, HTN, chol presents with cough and chest pain.  X 1 week. She has been taking nyquil and has been progressively worse. She feels like she has sharp knife like pain in the center of her back, rib pain, and left chest pain, worse with deep breath/cough. She did have worsening middle back pain while pushing a cart at [place] yesterday, better with rest. She has productive cough with sinus drainage, she has chills but no fever. No palpitations, no SOB.    Blood pressure 120/60, pulse 64, temperature 97.3 F (36.3 C), resp. rate 16, height 4' 10"  (1.473 m), weight 140 lb 9.6 oz (63.776 kg).  Past Medical History  Diagnosis Date  . Diabetes mellitus without complication   . Anxiety   . Hypertension   . Nonalcoholic hepatosteatosis   . Unspecified vitamin D deficiency   . Hyperlipidemia   . Atherosclerosis of aorta     via CXR 2013   Current Outpatient Prescriptions on File Prior to Visit  Medication Sig Dispense Refill  . ALPRAZolam (XANAX) 1 MG tablet 1/2 to 1 tablet 2 to 3 x daily as needed for nerves 90 tablet 3  . furosemide (LASIX) 40 MG tablet   98  . glimepiride (AMARYL) 4 MG tablet Take 1 tablet (4 mg total) by mouth daily with breakfast. 90 tablet 3  . lisinopril (PRINIVIL,ZESTRIL) 20 MG tablet TAKE 1 TABLET ONCE DAILY. 90 tablet 1  . Multiple Vitamins-Minerals (STRESS TAB NF PO) Take by mouth.    Marland Kitchen OVER THE COUNTER MEDICATION percogesic 4 tabs daily for pain 325 mg/ 12.5 mg     No current facility-administered medications on file prior to visit.   Review of Systems  Constitutional: Positive for chills and fatigue. Negative for fever.  HENT: Positive for congestion, rhinorrhea, sinus pressure and sore throat. Negative for dental problem, ear discharge, ear pain, nosebleeds, trouble swallowing and voice change.    Respiratory: Positive for cough, chest tightness, shortness of breath and wheezing.   Cardiovascular: Negative.   Gastrointestinal: Negative.   Genitourinary: Negative.   Musculoskeletal: Negative.   Neurological: Negative.        Objective:   Physical Exam  Constitutional: She is oriented to person, place, and time. She appears well-developed and well-nourished.  Cardiovascular: Regular rhythm and normal heart sounds.   No murmur heard. Pulmonary/Chest: Effort normal. No respiratory distress. She has wheezes (left lower lobe). She has no rales. She exhibits no tenderness.  Abdominal: Soft. Bowel sounds are normal. There is no tenderness.  Neurological: She is alert and oriented to person, place, and time.      Assessment & Plan:  COPD exacerbation- get CXR, no pneumonia, advised to stop smoking, zpak, prednisone 48m,  Back pain/exertional- back pain likely related to COPD/bronchitis however with risk factors and exertional back pain will get EKG to rule out MI- no changes in her EKG, Go to the ER if any CP, SOB, nausea, dizziness, severe HA, changes vision/speech Tobacco use- Smoking cessation-  instruction/counseling given, counseled patient on the dangers of tobacco use, advised patient to stop smoking, and reviewed strategies to maximize success, patient not ready to quit at this time.

## 2015-04-30 ENCOUNTER — Encounter: Payer: Self-pay | Admitting: Physician Assistant

## 2015-04-30 ENCOUNTER — Ambulatory Visit (INDEPENDENT_AMBULATORY_CARE_PROVIDER_SITE_OTHER): Payer: BLUE CROSS/BLUE SHIELD | Admitting: Physician Assistant

## 2015-04-30 VITALS — BP 140/80 | HR 94 | Temp 97.0°F | Resp 16 | Ht 59.0 in | Wt 140.0 lb

## 2015-04-30 DIAGNOSIS — F419 Anxiety disorder, unspecified: Secondary | ICD-10-CM

## 2015-04-30 DIAGNOSIS — J449 Chronic obstructive pulmonary disease, unspecified: Secondary | ICD-10-CM

## 2015-04-30 DIAGNOSIS — Z0000 Encounter for general adult medical examination without abnormal findings: Secondary | ICD-10-CM | POA: Diagnosis not present

## 2015-04-30 DIAGNOSIS — D649 Anemia, unspecified: Secondary | ICD-10-CM

## 2015-04-30 DIAGNOSIS — E1165 Type 2 diabetes mellitus with hyperglycemia: Secondary | ICD-10-CM | POA: Diagnosis not present

## 2015-04-30 DIAGNOSIS — F17200 Nicotine dependence, unspecified, uncomplicated: Secondary | ICD-10-CM

## 2015-04-30 DIAGNOSIS — Z9114 Patient's other noncompliance with medication regimen: Secondary | ICD-10-CM

## 2015-04-30 DIAGNOSIS — Z79899 Other long term (current) drug therapy: Secondary | ICD-10-CM

## 2015-04-30 DIAGNOSIS — Z6828 Body mass index (BMI) 28.0-28.9, adult: Secondary | ICD-10-CM

## 2015-04-30 DIAGNOSIS — Z23 Encounter for immunization: Secondary | ICD-10-CM

## 2015-04-30 DIAGNOSIS — K760 Fatty (change of) liver, not elsewhere classified: Secondary | ICD-10-CM

## 2015-04-30 DIAGNOSIS — Z0001 Encounter for general adult medical examination with abnormal findings: Secondary | ICD-10-CM

## 2015-04-30 DIAGNOSIS — I700 Atherosclerosis of aorta: Secondary | ICD-10-CM

## 2015-04-30 DIAGNOSIS — Z1159 Encounter for screening for other viral diseases: Secondary | ICD-10-CM

## 2015-04-30 DIAGNOSIS — E559 Vitamin D deficiency, unspecified: Secondary | ICD-10-CM | POA: Diagnosis not present

## 2015-04-30 DIAGNOSIS — I10 Essential (primary) hypertension: Secondary | ICD-10-CM

## 2015-04-30 DIAGNOSIS — E785 Hyperlipidemia, unspecified: Secondary | ICD-10-CM

## 2015-04-30 DIAGNOSIS — E1151 Type 2 diabetes mellitus with diabetic peripheral angiopathy without gangrene: Secondary | ICD-10-CM

## 2015-04-30 MED ORDER — LISINOPRIL 40 MG PO TABS: 40.0000 mg | ORAL_TABLET | Freq: Every day | ORAL | Status: DC

## 2015-04-30 NOTE — Patient Instructions (Signed)
Monitor your blood pressure at home, if it is above 140/90 consistently call the office so we can adjust your medications. Go to the ER if any CP, SOB, nausea, dizziness, severe HA, changes vision/speech  DASH Eating Plan DASH stands for "Dietary Approaches to Stop Hypertension." The DASH eating plan is a healthy eating plan that has been shown to reduce high blood pressure (hypertension). Additional health benefits may include reducing the risk of type 2 diabetes mellitus, heart disease, and stroke. The DASH eating plan may also help with weight loss. WHAT DO I NEED TO KNOW ABOUT THE DASH EATING PLAN? For the DASH eating plan, you will follow these general guidelines:  Choose foods with a percent daily value for sodium of less than 5% (as listed on the food label).  Use salt-free seasonings or herbs instead of table salt or sea salt.  Check with your health care provider or pharmacist before using salt substitutes.  Eat lower-sodium products, often labeled as "lower sodium" or "no salt added."  Eat fresh foods.  Eat more vegetables, fruits, and low-fat dairy products.  Choose whole grains. Look for the word "whole" as the first word in the ingredient list.  Choose fish and skinless chicken or [place] more often than red meat. Limit fish, poultry, and meat to 6 oz (170 g) each day.  Limit sweets, desserts, sugars, and sugary drinks.  Choose heart-healthy fats.  Limit cheese to 1 oz (28 g) per day.  Eat more home-cooked food and less restaurant, buffet, and fast food.  Limit fried foods.  Cook foods using methods other than frying.  Limit canned vegetables. If you do use them, rinse them well to decrease the sodium.  When eating at a restaurant, ask that your food be prepared with less salt, or no salt if possible. WHAT FOODS CAN I EAT? Seek help from a dietitian for individual calorie needs. Grains Whole grain or whole wheat bread. Brown rice. Whole grain or whole wheat pasta.  Quinoa, bulgur, and whole grain cereals. Low-sodium cereals. Corn or whole wheat flour tortillas. Whole grain cornbread. Whole grain crackers. Low-sodium crackers. Vegetables Fresh or frozen vegetables (raw, steamed, roasted, or grilled). Low-sodium or reduced-sodium tomato and vegetable juices. Low-sodium or reduced-sodium tomato sauce and paste. Low-sodium or reduced-sodium canned vegetables.  Fruits All fresh, canned (in natural juice), or frozen fruits. Meat and Other Protein Products Ground beef (85% or leaner), grass-fed beef, or beef trimmed of fat. Skinless chicken or [place]. Ground chicken or [place]. Pork trimmed of fat. All fish and seafood. Eggs. Dried beans, peas, or lentils. Unsalted nuts and seeds. Unsalted canned beans. Dairy Low-fat dairy products, such as skim or 1% milk, 2% or reduced-fat cheeses, low-fat ricotta or cottage cheese, or plain low-fat yogurt. Low-sodium or reduced-sodium cheeses. Fats and Oils Tub margarines without trans fats. Light or reduced-fat mayonnaise and salad dressings (reduced sodium). Avocado. Safflower, olive, or canola oils. Natural peanut or almond butter. Other Unsalted popcorn and pretzels. The items listed above may not be a complete list of recommended foods or beverages. Contact your dietitian for more options. WHAT FOODS ARE NOT RECOMMENDED? Grains White bread. White pasta. White rice. Refined cornbread. Bagels and croissants. Crackers that contain trans fat. Vegetables Creamed or fried vegetables. Vegetables in a cheese sauce. Regular canned vegetables. Regular canned tomato sauce and paste. Regular tomato and vegetable juices. Fruits Dried fruits. Canned fruit in light or heavy syrup. Fruit juice. Meat and Other Protein Products Fatty cuts of meat. Ribs, chicken wings,  bacon, sausage, bologna, salami, chitterlings, fatback, hot dogs, bratwurst, and packaged luncheon meats. Salted nuts and seeds. Canned beans with salt. Dairy Whole or 2%  milk, cream, half-and-half, and cream cheese. Whole-fat or sweetened yogurt. Full-fat cheeses or blue cheese. Nondairy creamers and whipped toppings. Processed cheese, cheese spreads, or cheese curds. Condiments Onion and garlic salt, seasoned salt, table salt, and sea salt. Canned and packaged gravies. Worcestershire sauce. Tartar sauce. Barbecue sauce. Teriyaki sauce. Soy sauce, including reduced sodium. Steak sauce. Fish sauce. Oyster sauce. Cocktail sauce. Horseradish. Ketchup and mustard. Meat flavorings and tenderizers. Bouillon cubes. Hot sauce. Tabasco sauce. Marinades. Taco seasonings. Relishes. Fats and Oils Butter, stick margarine, lard, shortening, ghee, and bacon fat. Coconut, palm kernel, or palm oils. Regular salad dressings. Other Pickles and olives. Salted popcorn and pretzels. The items listed above may not be a complete list of foods and beverages to avoid. Contact your dietitian for more information. WHERE CAN I FIND MORE INFORMATION? National Heart, Lung, and Blood Institute: travelstabloid.com Document Released: 07/02/2011 Document Revised: 11/27/2013 Document Reviewed: 05/17/2013 [facility] Patient Information 2015 [place], [place]. This information is not intended to replace advice given to you by your health care provider. Make sure you discuss any questions you have with your health care provider.  Preventive Care for Adults  A healthy lifestyle and preventive care can promote health and wellness. Preventive health guidelines for women include the following key practices.  A routine yearly physical is a good way to check with your health care provider about your health and preventive screening. It is a chance to share any concerns and updates on your health and to receive a thorough exam.  Visit your dentist for a routine exam and preventive care every 6 months. Brush your teeth twice a day and floss once a day. Good oral hygiene prevents  tooth decay and gum disease.  The frequency of eye exams is based on your age, health, family medical history, use of contact lenses, and other factors. Follow your health care provider's recommendations for frequency of eye exams.  Eat a healthy diet. Foods like vegetables, fruits, whole grains, low-fat dairy products, and lean protein foods contain the nutrients you need without too many calories. Decrease your intake of foods high in solid fats, added sugars, and salt. Eat the right amount of calories for you.Get information about a proper diet from your health care provider, if necessary.  Regular physical exercise is one of the most important things you can do for your health. Most adults should get at least 150 minutes of moderate-intensity exercise (any activity that increases your heart rate and causes you to sweat) each week. In addition, most adults need muscle-strengthening exercises on 2 or more days a week.  Maintain a healthy weight. The body mass index (BMI) is a screening tool to identify possible weight problems. It provides an estimate of body fat based on height and weight. Your health care provider can find your BMI and can help you achieve or maintain a healthy weight.For adults 20 years and older:  A BMI below 18.5 is considered underweight.  A BMI of 18.5 to 24.9 is normal.  A BMI of 25 to 29.9 is considered overweight.  A BMI of 30 and above is considered obese.  Maintain normal blood lipids and cholesterol levels by exercising and minimizing your intake of saturated fat. Eat a balanced diet with plenty of fruit and vegetables. Blood tests for lipids and cholesterol should begin at age 66 and be repeated  every 5 years. If your lipid or cholesterol levels are high, you are over 50, or you are at high risk for heart disease, you may need your cholesterol levels checked more frequently.Ongoing high lipid and cholesterol levels should be treated with medicines if diet and  exercise are not working.  If you smoke, find out from your health care provider how to quit. If you do not use tobacco, do not start.  Lung cancer screening is recommended for adults aged 12-80 years who are at high risk for developing lung cancer because of a history of smoking. A yearly low-dose CT scan of the lungs is recommended for people who have at least a 30-pack-year history of smoking and are a current smoker or have quit within the past 15 years. A pack year of smoking is smoking an average of 1 pack of cigarettes a day for 1 year (for example: 1 pack a day for 30 years or 2 packs a day for 15 years). Yearly screening should continue until the smoker has stopped smoking for at least 15 years. Yearly screening should be stopped for people who develop a health problem that would prevent them from having lung cancer treatment.  High blood pressure causes heart disease and increases the risk of stroke. Your blood pressure should be checked at least every 1 to 2 years. Ongoing high blood pressure should be treated with medicines if weight loss and exercise do not work.  If you are 63-107 years old, ask your health care provider if you should take aspirin to prevent strokes.  Diabetes screening involves taking a blood sample to check your fasting blood sugar level. This should be done once every 3 years, after age 18, if you are within normal weight and without risk factors for diabetes. Testing should be considered at a younger age or be carried out more frequently if you are overweight and have at least 1 risk factor for diabetes.  Breast cancer screening is essential preventive care for women. You should practice "breast self-awareness." This means understanding the normal appearance and feel of your breasts and may include breast self-examination. Any changes detected, no matter how small, should be reported to a health care provider. Women in their 65s and 30s should have a clinical breast exam  (CBE) by a health care provider as part of a regular health exam every 1 to 3 years. After age 67, women should have a CBE every year. Starting at age 27, women should consider having a mammogram (breast X-ray test) every year. Women who have a family history of breast cancer should talk to their health care provider about genetic screening. Women at a high risk of breast cancer should talk to their health care providers about having an MRI and a mammogram every year.  Breast cancer gene (BRCA)-related cancer risk assessment is recommended for women who have family members with BRCA-related cancers. BRCA-related cancers include breast, ovarian, tubal, and peritoneal cancers. Having family members with these cancers may be associated with an increased risk for harmful changes (mutations) in the breast cancer genes BRCA1 and BRCA2. Results of the assessment will determine the need for genetic counseling and BRCA1 and BRCA2 testing.  Routine pelvic exams to screen for cancer are no longer recommended for nonpregnant women who are considered low risk for cancer of the pelvic organs (ovaries, uterus, and vagina) and who do not have symptoms. Ask your health care provider if a screening pelvic exam is right for you.  If  you have had past treatment for cervical cancer or a condition that could lead to cancer, you need Pap tests and screening for cancer for at least 20 years after your treatment. If Pap tests have been discontinued, your risk factors (such as having a new sexual partner) need to be reassessed to determine if screening should be resumed. Some women have medical problems that increase the chance of getting cervical cancer. In these cases, your health care provider may recommend more frequent screening and Pap tests.  Colorectal cancer can be detected and often prevented. Most routine colorectal cancer screening begins at the age of 65 years and continues through age 41 years. However, your health care  provider may recommend screening at an earlier age if you have risk factors for colon cancer. On a yearly basis, your health care provider may provide home test kits to check for hidden blood in the stool. Use of a small camera at the end of a tube, to directly examine the colon (sigmoidoscopy or colonoscopy), can detect the earliest forms of colorectal cancer. Talk to your health care provider about this at age 78, when routine screening begins. Direct exam of the colon should be repeated every 5-10 years through age 45 years, unless early forms of pre-cancerous polyps or small growths are found.  Hepatitis C blood testing is recommended for all people born from [birth year] through 1965 and any individual with known risks for hepatitis C.  Pra  Osteoporosis is a disease in which the bones lose minerals and strength with aging. This can result in serious bone fractures or breaks. The risk of osteoporosis can be identified using a bone density scan. Women ages 71 years and over and women at risk for fractures or osteoporosis should discuss screening with their health care providers. Ask your health care provider whether you should take a calcium supplement or vitamin D to reduce the rate of osteoporosis.  Menopause can be associated with physical symptoms and risks. Hormone replacement therapy is available to decrease symptoms and risks. You should talk to your health care provider about whether hormone replacement therapy is right for you.  Use sunscreen. Apply sunscreen liberally and repeatedly throughout the day. You should seek shade when your shadow is shorter than you. Protect yourself by wearing long sleeves, pants, a wide-brimmed hat, and sunglasses year round, whenever you are outdoors.  Once a month, do a whole body skin exam, using a mirror to look at the skin on your back. Tell your health care provider of new moles, moles that have irregular borders, moles that are larger than a pencil eraser, or  moles that have changed in shape or color.  Stay current with required vaccines (immunizations).  Influenza vaccine. All adults should be immunized every year.  Tetanus, diphtheria, and acellular pertussis (Td, Tdap) vaccine. Pregnant women should receive 1 dose of Tdap vaccine during each pregnancy. The dose should be obtained regardless of the length of time since the last dose. Immunization is preferred during the 27th-36th week of gestation. An adult who has not previously received Tdap or who does not know her vaccine status should receive 1 dose of Tdap. This initial dose should be followed by tetanus and diphtheria toxoids (Td) booster doses every 10 years. Adults with an unknown or incomplete history of completing a 3-dose immunization series with Td-containing vaccines should begin or complete a primary immunization series including a Tdap dose. Adults should receive a Td booster every 10 years.  Varicella vaccine. An  adult without evidence of immunity to varicella should receive 2 doses or a second dose if she has previously received 1 dose. Pregnant females who do not have evidence of immunity should receive the first dose after pregnancy. This first dose should be obtained before leaving the health care facility. The second dose should be obtained 4-8 weeks after the first dose.  Human papillomavirus (HPV) vaccine. Females aged 13-26 years who have not received the vaccine previously should obtain the 3-dose series. The vaccine is not recommended for use in pregnant females. However, pregnancy testing is not needed before receiving a dose. If a female is found to be pregnant after receiving a dose, no treatment is needed. In that case, the remaining doses should be delayed until after the pregnancy. Immunization is recommended for any person with an immunocompromised condition through the age of 35 years if she did not get any or all doses earlier. During the 3-dose series, the second dose  should be obtained 4-8 weeks after the first dose. The third dose should be obtained 24 weeks after the first dose and 16 weeks after the second dose.  Zoster vaccine. One dose is recommended for adults aged 94 years or older unless certain conditions are present.  Measles, mumps, and rubella (MMR) vaccine. Adults born before [birth year] generally are considered immune to measles and mumps. Adults born in [birth year] or later should have 1 or more doses of MMR vaccine unless there is a contraindication to the vaccine or there is laboratory evidence of immunity to each of the three diseases. A routine second dose of MMR vaccine should be obtained at least 28 days after the first dose for students attending postsecondary schools, health care workers, or international travelers. People who received inactivated measles vaccine or an unknown type of measles vaccine during 1963-1967 should receive 2 doses of MMR vaccine. People who received inactivated mumps vaccine or an unknown type of mumps vaccine before 1979 and are at high risk for mumps infection should consider immunization with 2 doses of MMR vaccine. For females of childbearing age, rubella immunity should be determined. If there is no evidence of immunity, females who are not pregnant should be vaccinated. If there is no evidence of immunity, females who are pregnant should delay immunization until after pregnancy. Unvaccinated health care workers born before [birth year] who lack laboratory evidence of measles, mumps, or rubella immunity or laboratory confirmation of disease should consider measles and mumps immunization with 2 doses of MMR vaccine or rubella immunization with 1 dose of MMR vaccine.  Pneumococcal 13-valent conjugate (PCV13) vaccine. When indicated, a person who is uncertain of her immunization history and has no record of immunization should receive the PCV13 vaccine. An adult aged 60 years or older who has certain medical conditions and has not been  previously immunized should receive 1 dose of PCV13 vaccine. This PCV13 should be followed with a dose of pneumococcal polysaccharide (PPSV23) vaccine. The PPSV23 vaccine dose should be obtained at least 8 weeks after the dose of PCV13 vaccine. An adult aged 44 years or older who has certain medical conditions and previously received 1 or more doses of PPSV23 vaccine should receive 1 dose of PCV13. The PCV13 vaccine dose should be obtained 1 or more years after the last PPSV23 vaccine dose.    Pneumococcal polysaccharide (PPSV23) vaccine. When PCV13 is also indicated, PCV13 should be obtained first. All adults aged 51 years and older should be immunized. An adult younger than age 31 years who  has certain medical conditions should be immunized. Any person who resides in a nursing home or long-term care facility should be immunized. An adult smoker should be immunized. People with an immunocompromised condition and certain other conditions should receive both PCV13 and PPSV23 vaccines. People with human immunodeficiency virus (HIV) infection should be immunized as soon as possible after diagnosis. Immunization during chemotherapy or radiation therapy should be avoided. Routine use of PPSV23 vaccine is not recommended for American Indians, [place] Natives, or people younger than 65 years unless there are medical conditions that require PPSV23 vaccine. When indicated, people who have unknown immunization and have no record of immunization should receive PPSV23 vaccine. One-time revaccination 5 years after the first dose of PPSV23 is recommended for people aged 19-64 years who have chronic kidney failure, nephrotic syndrome, asplenia, or immunocompromised conditions. People who received 1-2 doses of PPSV23 before age 72 years should receive another dose of PPSV23 vaccine at age 67 years or later if at least 5 years have passed since the previous dose. Doses of PPSV23 are not needed for people immunized with PPSV23 at  or after age 30 years.  Preventive Services / Frequency   Ages 1 to 68 years  Blood pressure check.  Lipid and cholesterol check.  Lung cancer screening. / Every year if you are aged 66-80 years and have a 30-pack-year history of smoking and currently smoke or have quit within the past 15 years. Yearly screening is stopped once you have quit smoking for at least 15 years or develop a health problem that would prevent you from having lung cancer treatment.  Clinical breast exam.** / Every year after age 47 years.  BRCA-related cancer risk assessment.** / For women who have family members with a BRCA-related cancer (breast, ovarian, tubal, or peritoneal cancers).  Mammogram.** / Every year beginning at age 78 years and continuing for as long as you are in good health. Consult with your health care provider.  Pap test.** / Every 3 years starting at age 1 years through age 38 or 4 years with a history of 3 consecutive normal Pap tests.  HPV screening.** / Every 3 years from ages 50 years through ages 65 to 39 years with a history of 3 consecutive normal Pap tests.  Fecal occult blood test (FOBT) of stool. / Every year beginning at age 29 years and continuing until age 82 years. You may not need to do this test if you get a colonoscopy every 10 years.  Flexible sigmoidoscopy or colonoscopy.** / Every 5 years for a flexible sigmoidoscopy or every 10 years for a colonoscopy beginning at age 50 years and continuing until age 65 years.  Hepatitis C blood test.** / For all people born from [birth year] through 1965 and any individual with known risks for hepatitis C.  Skin self-exam. / Monthly.  Influenza vaccine. / Every year.  Tetanus, diphtheria, and acellular pertussis (Tdap/Td) vaccine.** / Consult your health care provider. Pregnant women should receive 1 dose of Tdap vaccine during each pregnancy. 1 dose of Td every 10 years.  Varicella vaccine.** / Consult your health care provider.  Pregnant females who do not have evidence of immunity should receive the first dose after pregnancy.  Zoster vaccine.** / 1 dose for adults aged 38 years or older.  Pneumococcal 13-valent conjugate (PCV13) vaccine.** / Consult your health care provider.  Pneumococcal polysaccharide (PPSV23) vaccine.** / 1 to 2 doses if you smoke cigarettes or if you have certain conditions.  Meningococcal vaccine.** / Consult  your health care provider.  Hepatitis A vaccine.** / Consult your health care provider.  Hepatitis B vaccine.** / Consult your health care provider. Screening for abdominal aortic aneurysm (AAA)  by ultrasound is recommended for people over 50 who have history of high blood pressure or who are current or former smokers.

## 2015-04-30 NOTE — Progress Notes (Signed)
Complete Physical  Assessment and Plan: 1. Essential hypertension Stop HCTZ due to hyponatremia, will increase lisinoril to 56m daily.  - lisinopril (PRINIVIL,ZESTRIL) 40 MG tablet; Take 1 tablet (40 mg total) by mouth daily.  Dispense: 90 tablet; Refill: 3 - CBC with Differential/Platelet - BASIC METABOLIC PANEL WITH GFR - Hepatic function panel - TSH - Urinalysis, [facility]ine w reflex microscopic [facility] - Microalbumin / creatinine urine ratio - [place] RETROPERITNL ABD,  LTD  2. T2_NIDDM, poorly controlled Discussed general issues about diabetes pathophysiology and management., Educational material distributed., Suggested low cholesterol diet., Encouraged aerobic exercise., Discussed foot care., Reminded to get yearly retinal exam. - lisinopril (PRINIVIL,ZESTRIL) 40 MG tablet; Take 1 tablet (40 mg total) by mouth daily.  Dispense: 90 tablet; Refill: 3 - Hemoglobin A1c - Urinalysis, [facility] reflex microscopic [facility] - Microalbumin / creatinine urine ratio - LOW EXTREMITY NEUR EXAM DOCUM  3. Hyperlipidemia Will hold the zpak and take if she is not getting better, increase fluids, rest, cont allergy pill - Lipid panel  4. Nonalcoholic hepatosteatosis Check labs, avoid tylenol, alcohol, weight loss advised.  - Hepatic function panel  5. Tobacco use disorder Smoking cessation-  instruction/counseling given, counseled patient on the dangers of tobacco use, advised patient to stop smoking, and reviewed strategies to maximize success, patient not ready to quit at this time.  - [place] RETROPERITNL ABD,  LTD  6. Vitamin D deficiency - Vit D  25 hydroxy (rtn osteoporosis monitoring)  7. Medication management - Magnesium  8. Poor compliance with medication Noncompliance- emphasized compliance with medications and appointments, patient expressed understanding  9. Anxiety remission  10. 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 - Hemoglobin A1c - Urinalysis, [facility] reflex microscopic [facility] - Microalbumin / creatinine urine ratio  11. Chronic obstructive pulmonary disease, unspecified COPD type ([place] Advised to stop smoking, will get CXR, continue meds.   12. Encounter for general adult medical examination with abnormal findings Declines several vaccines and preventative exams despite long discussion, understands risk of death and still declines  13. Need for Tdap vaccination - Tdap vaccine greater than or equal to 7yo IM  14. Anemia, unspecified anemia type - Iron and TIBC - Ferritin - Vitamin B12  15. Screening for viral disease - Hepatitis C antibody - HIV antibody    Discussed med's effects and SE's. Screening labs and tests as requested with regular follow-up as recommended. Over 40 minutes of exam, counseling, chart review, and complex, high level critical decision making was performed this visit.   HPI  64y.o. female  presents for a complete physical.  Her blood pressure has not been controlled at home, she is on lisinopril 266m today their BP is BP: 140/80 mmHg  She has had hyponatremia in the past, has been on HCTZ 2531mnd lisinopril 27m31mas not taken the lisinopril today.  She does not workout. She denies chest pain, shortness of breath, dizziness.  She does smoke, has COPD, is not interested in quitting at this time.  She is not on cholesterol medication and denies myalgias. Her cholesterol is not at goal. The cholesterol last visit was:   Lab Results  Component Value Date   CHOL 215* 01/10/2015   HDL 37* 01/10/2015   LDLCALC 109* 01/10/2015   TRIG 346* 01/10/2015  CHOLHDL 5.8 01/10/2015   She has been working on diet and exercise for diabetes, she is not on bASA due  to gastritis she is on ACE/ARB and denies paresthesia of the feet, polydipsia, polyuria and visual disturbances. Last A1C in the office was:  Lab Results  Component Value Date   HGBA1C 8.8* 01/10/2015   Patient is on Vitamin D supplement.   Lab Results  Component Value Date   VD25OH 9* 01/10/2015     BMI is Body mass index is 28.26 kg/(m^2)., she is working on diet and exercise. Wt Readings from Last 3 Encounters:  04/30/15 140 lb (63.504 kg)  04/08/15 140 lb 9.6 oz (63.776 kg)  01/10/15 137 lb 9.6 oz (62.415 kg)    Current Medications:  Current Outpatient Prescriptions on File Prior to Visit  Medication Sig Dispense Refill  . ALPRAZolam (XANAX) 1 MG tablet 1/2 to 1 tablet 2 to 3 x daily as needed for nerves 90 tablet 3  . glimepiride (AMARYL) 4 MG tablet Take 1 tablet (4 mg total) by mouth daily with breakfast. 90 tablet 3  . lisinopril (PRINIVIL,ZESTRIL) 20 MG tablet TAKE 1 TABLET ONCE DAILY. 90 tablet 1  . Multiple Vitamins-Minerals (STRESS TAB NF PO) Take by mouth.    Marland Kitchen OVER THE COUNTER MEDICATION percogesic 4 tabs daily for pain 325 mg/ 12.5 mg     No current facility-administered medications on file prior to visit.   Health Maintenance:   Immunization History  Administered Date(s) Administered  . Td 06/26/2001   Tetanus: 2002 DUE  Pneumovax:DECLINES Prevnar 13:  Age 38 Flu vaccine: DECLINES Zostavax: N/A  Pap: remote, declines another MGM: 2007 DECLINES DEXA: declines Colonoscopy: REFUSES CT AB 2007 CXR 03/2015  Last Dental Exam: Affordable dentures, has dentures.  Last Eye Exam: NEVER Patient Care Team: Unk Pinto, MD as PCP - General (Internal Medicine)  Allergies:  Allergies  Allergen Reactions  . Metformin And Related     ELEVATED BS. GLYBURIDE, ONGLYZA, ACTOS, INVOKANA  . Insulins Rash  . Janumet [Sitagliptin-Metformin Hcl] Rash    Shakes/ rash  . Penicillins Rash   Medical History:  Past Medical History  Diagnosis Date  . Diabetes  mellitus without complication ([place])   . Anxiety   . Hypertension   . Nonalcoholic hepatosteatosis   . Unspecified vitamin D deficiency   . Hyperlipidemia   . Atherosclerosis of aorta ([place])     via CXR 2013   Surgical History:  Past Surgical History  Procedure Laterality Date  . Abdominal hysterectomy    . Dilation and curettage of uterus     Family History:  Family History  Problem Relation Age of Onset  . Diabetes Mother   . Stroke Mother   . Cancer Father     PROSTATE  . Diabetes Father   . Hypertension Father   . Hyperlipidemia Sister   . Heart disease Brother   . Hyperlipidemia Brother   . Hypertension Brother   . Diabetes Brother   . Heart disease Sister   . Hyperlipidemia Sister   . Hypertension Sister    Social History:  Social History  Substance Use Topics  . Smoking status: Current Every Day Smoker -- 2.00 packs/day    Types: Cigarettes  . Smokeless tobacco: None     Comment: smokes 2-3 packs per day depending on stress level  . Alcohol Use: No    Review of Systems: Review of Systems  Constitutional: Negative.   HENT: Negative for congestion, ear discharge,  ear pain, hearing loss, nosebleeds and tinnitus.   Eyes: Negative.   Respiratory: Negative.  Negative for stridor.   Cardiovascular: Negative.  Negative for chest pain.  Gastrointestinal: Negative.   Genitourinary: Negative.   Musculoskeletal: Negative.  Negative for falls.  Skin: Negative.   Neurological: Positive for headaches.  Endo/Heme/Allergies: Negative.   Psychiatric/Behavioral: Negative.  Negative for memory loss. The patient does not have insomnia.     Physical Exam: Estimated body mass index is 28.26 kg/(m^2) as calculated from the following:   Height as of this encounter: 4' 11"  (1.499 m).   Weight as of this encounter: 140 lb (63.504 kg). BP 140/80 mmHg  Pulse 94  Temp(Src) 97 F (36.1 C) (Temporal)  Resp 16  Ht 4' 11"  (1.499 m)  Wt 140 lb (63.504 kg)  BMI 28.26 kg/m2   SpO2 95% General Appearance: Well nourished, in no apparent distress.  Eyes: PERRLA, EOMs, conjunctiva no swelling or erythema, normal fundi and vessels.  Sinuses: No Frontal/maxillary tenderness  ENT/Mouth: Ext aud canals clear, normal light reflex with TMs without erythema, bulging. Good dentition. No erythema, swelling, or exudate on post pharynx. Tonsils not swollen or erythematous. Hearing normal.  Neck: Supple, thyroid normal. No bruits  Respiratory: Respiratory effort normal, BS equal bilaterally without rales, rhonchi, wheezing or stridor.  Cardio: RRR without murmurs, rubs or gallops. Brisk peripheral pulses without edema.  Chest: symmetric, with normal excursions and percussion.  Breasts: defer  Abdomen: Soft, nontender, no guarding, rebound, hernias, masses, or organomegaly.  Lymphatics: Non tender without lymphadenopathy.  Genitourinary: defer Musculoskeletal: Full ROM all peripheral extremities,5/5 strength, and normal gait.  Skin: Warm, dry without rashes, lesions, ecchymosis. Neuro: Cranial nerves intact, reflexes equal bilaterally. Normal muscle tone, no cerebellar symptoms. Sensation intact.  Psych: Awake and oriented X 3, normal affect, Insight and Judgment appropriate.   EKG: had last month AORTA SCAN: WNL   Vicie Mutters 3:13 PM [facility]

## 2015-05-01 LAB — BASIC METABOLIC PANEL WITH GFR
BUN: 8 mg/dL (ref 7–25)
CHLORIDE: 94 mmol/L — AB (ref 98–110)
CO2: 29 mmol/L (ref 20–31)
Calcium: 9.6 mg/dL (ref 8.6–10.4)
Creat: 0.63 mg/dL (ref 0.50–0.99)
GFR, Est Non African American: >89 mL/min (ref >=60)
GLUCOSE: 113 mg/dL — AB (ref 65–99)
POTASSIUM: 4.1 mmol/L (ref 3.5–5.3)
Sodium: 131 mmol/L — ABNORMAL LOW (ref 135–146)

## 2015-05-01 LAB — CBC WITH DIFFERENTIAL/PLATELET
BASOS ABS: 0.2 10*3/uL — AB (ref 0.0–0.1)
Basophils Relative: 2 % — ABNORMAL HIGH (ref 0–1)
EOS PCT: 5 % (ref 0–5)
Eosinophils Absolute: 0.4 10*3/uL (ref 0.0–0.7)
HEMATOCRIT: 43.3 % (ref 36.0–46.0)
Hemoglobin: 14.9 g/dL (ref 12.0–15.0)
LYMPHS ABS: 2.5 10*3/uL (ref 0.7–4.0)
LYMPHS PCT: 33 % (ref 12–46)
MCH: 29.7 pg (ref 26.0–34.0)
MCHC: 34.4 g/dL (ref 30.0–36.0)
MCV: 86.3 fL (ref 78.0–100.0)
MPV: 9.9 fL (ref 8.6–12.4)
Monocytes Absolute: 0.7 10*3/uL (ref 0.1–1.0)
Monocytes Relative: 9 % (ref 3–12)
Neutro Abs: 3.8 10*3/uL (ref 1.7–7.7)
Neutrophils Relative %: 51 % (ref 43–77)
Platelets: 260 10*3/uL (ref 150–400)
RBC: 5.02 MIL/uL (ref 3.87–5.11)
RDW: 13 % (ref 11.5–15.5)
WBC: 7.5 10*3/uL (ref 4.0–10.5)

## 2015-05-01 LAB — LIPID PANEL
CHOL/HDL RATIO: 6.5 ratio — AB (ref <=5.0)
Cholesterol: 248 mg/dL — ABNORMAL HIGH (ref 125–200)
HDL: 38 mg/dL — AB (ref >=46)
LDL CALC: 162 mg/dL — AB (ref <130)
TRIGLYCERIDES: 242 mg/dL — AB (ref <150)
VLDL: 48 mg/dL — ABNORMAL HIGH (ref <30)

## 2015-05-01 LAB — URINALYSIS, ROUTINE W REFLEX MICROSCOPIC
BILIRUBIN URINE: NEGATIVE
Glucose, UA: NEGATIVE
KETONES UR: NEGATIVE
NITRITE: NEGATIVE
PH: 6 (ref 5.0–8.0)
SPECIFIC GRAVITY, URINE: 1.007 (ref 1.001–1.035)

## 2015-05-01 LAB — MICROALBUMIN / CREATININE URINE RATIO
CREATININE, URINE: 20.8 mg/dL
Microalb Creat Ratio: 1375 mg/g — ABNORMAL HIGH (ref 0.0–30.0)
Microalb, Ur: 28.6 mg/dL — ABNORMAL HIGH (ref <2.0)

## 2015-05-01 LAB — HEPATIC FUNCTION PANEL
ALBUMIN: 3.9 g/dL (ref 3.6–5.1)
ALK PHOS: 116 U/L (ref 33–130)
ALT: 34 U/L — AB (ref 6–29)
AST: 33 U/L (ref 10–35)
Bilirubin, Direct: 0.1 mg/dL (ref <=0.2)
Indirect Bilirubin: 0.3 mg/dL (ref 0.2–1.2)
TOTAL PROTEIN: 7.2 g/dL (ref 6.1–8.1)
Total Bilirubin: 0.4 mg/dL (ref 0.2–1.2)

## 2015-05-01 LAB — URINALYSIS, MICROSCOPIC ONLY
CASTS: NONE SEEN [LPF]
CRYSTALS: NONE SEEN [HPF]
RBC / HPF: NONE SEEN RBC/HPF (ref <=2)
Squamous Epithelial / LPF: NONE SEEN [HPF] (ref <=5)
WBC, UA: >60 WBC/HPF — AB (ref <=5)
YEAST: NONE SEEN [HPF]

## 2015-05-01 LAB — IRON AND TIBC
%SAT: 17 % (ref 11–50)
Iron: 63 ug/dL (ref 45–160)
TIBC: 371 ug/dL (ref 250–450)
UIBC: 308 ug/dL (ref 125–400)

## 2015-05-01 LAB — FERRITIN: FERRITIN: 199 ng/mL (ref 10–291)

## 2015-05-01 LAB — VITAMIN D 25 HYDROXY (VIT D DEFICIENCY, FRACTURES): VIT D 25 HYDROXY: 11 ng/mL — AB (ref 30–100)

## 2015-05-01 LAB — VITAMIN B12: VITAMIN B 12: 570 pg/mL (ref 211–911)

## 2015-05-01 LAB — HEMOGLOBIN A1C
Hgb A1c MFr Bld: 8.9 % — ABNORMAL HIGH (ref <5.7)
Mean Plasma Glucose: 209 mg/dL — ABNORMAL HIGH (ref <117)

## 2015-05-01 LAB — TSH: TSH: 0.982 u[IU]/mL (ref 0.350–4.500)

## 2015-05-01 LAB — MAGNESIUM: Magnesium: 1.8 mg/dL (ref 1.5–2.5)

## 2015-05-01 LAB — HIV ANTIBODY (ROUTINE TESTING W REFLEX): HIV 1&2 Ab, 4th Generation: NONREACTIVE

## 2015-05-01 LAB — HEPATITIS C ANTIBODY: HCV AB: NEGATIVE

## 2015-05-31 ENCOUNTER — Encounter: Payer: Self-pay | Admitting: Physician Assistant

## 2015-05-31 ENCOUNTER — Ambulatory Visit (INDEPENDENT_AMBULATORY_CARE_PROVIDER_SITE_OTHER): Payer: BLUE CROSS/BLUE SHIELD | Admitting: Physician Assistant

## 2015-05-31 VITALS — BP 110/80 | HR 101 | Temp 97.5°F | Resp 14 | Ht 59.0 in | Wt 138.0 lb

## 2015-05-31 DIAGNOSIS — R251 Tremor, unspecified: Secondary | ICD-10-CM

## 2015-05-31 DIAGNOSIS — F32A Depression, unspecified: Secondary | ICD-10-CM

## 2015-05-31 DIAGNOSIS — I10 Essential (primary) hypertension: Secondary | ICD-10-CM

## 2015-05-31 DIAGNOSIS — F411 Generalized anxiety disorder: Secondary | ICD-10-CM | POA: Diagnosis not present

## 2015-05-31 DIAGNOSIS — R110 Nausea: Secondary | ICD-10-CM | POA: Diagnosis not present

## 2015-05-31 DIAGNOSIS — F329 Major depressive disorder, single episode, unspecified: Secondary | ICD-10-CM | POA: Diagnosis not present

## 2015-05-31 DIAGNOSIS — R200 Anesthesia of skin: Secondary | ICD-10-CM

## 2015-05-31 DIAGNOSIS — R208 Other disturbances of skin sensation: Secondary | ICD-10-CM

## 2015-05-31 LAB — BASIC METABOLIC PANEL WITH GFR
BUN: 9 mg/dL (ref 7–25)
CALCIUM: 9.4 mg/dL (ref 8.6–10.4)
CHLORIDE: 93 mmol/L — AB (ref 98–110)
CO2: 30 mmol/L (ref 20–31)
CREATININE: 0.65 mg/dL (ref 0.50–0.99)
GFR, Est African American: >89 mL/min (ref >=60)
GFR, Est Non African American: >89 mL/min (ref >=60)
Glucose, Bld: 135 mg/dL — ABNORMAL HIGH (ref 65–99)
Potassium: 4 mmol/L (ref 3.5–5.3)
Sodium: 129 mmol/L — ABNORMAL LOW (ref 135–146)

## 2015-05-31 MED ORDER — CITALOPRAM HYDROBROMIDE 20 MG PO TABS: 20.0000 mg | ORAL_TABLET | Freq: Every day | ORAL | Status: DC

## 2015-05-31 MED ORDER — ALPRAZOLAM 1 MG PO TABS: ORAL_TABLET | ORAL | Status: DC

## 2015-05-31 MED ORDER — GLIMEPIRIDE 4 MG PO TABS: 4.0000 mg | ORAL_TABLET | Freq: Every day | ORAL | Status: DC

## 2015-05-31 NOTE — Patient Instructions (Addendum)
Cut your lisinopril to 1/2 pill daily of 20 mg and monitor BP  OR cut the lisinopril 30m to 1/4 pill  Get on celexa 288m1/2 pill daily  Add ENTERIC COATED low dose 81 mg Aspirin daily OR can do every other day if you have easy bruising to protect your heart and head. As well as to reduce risk of Colon Cancer by 20 %, Skin Cancer by 26 % , Melanoma by 46% and Pancreatic cancer by 60%   Monitor your blood pressure at home. Go to the ER if any CP, SOB, nausea, dizziness, severe HA, changes vision/speech  Goal BP:  For patients younger than 60: Goal BP < 140/90. For patients 60 and older: Goal BP < 150/90. For patients with diabetes: Goal BP < 140/90. Your most recent BP: BP: 110/80 mmHg   Take your medications faithfully as instructed. Maintain a healthy weight. Get at least 150 minutes of aerobic exercise per week. Minimize salt intake. Minimize alcohol intake  DASH Eating Plan DASH stands for "Dietary Approaches to Stop Hypertension." The DASH eating plan is a healthy eating plan that has been shown to reduce high blood pressure (hypertension). Additional health benefits may include reducing the risk of type 2 diabetes mellitus, heart disease, and stroke. The DASH eating plan may also help with weight loss. WHAT DO I NEED TO KNOW ABOUT THE DASH EATING PLAN? For the DASH eating plan, you will follow these general guidelines:  Choose foods with a percent daily value for sodium of less than 5% (as listed on the food label).  Use salt-free seasonings or herbs instead of table salt or sea salt.  Check with your health care provider or pharmacist before using salt substitutes.  Eat lower-sodium products, often labeled as "lower sodium" or "no salt added."  Eat fresh foods.  Eat more vegetables, fruits, and low-fat dairy products.  Choose whole grains. Look for the word "whole" as the first word in the ingredient list.  Choose fish and skinless chicken or tu[place] often than red  meat. Limit fish, poultry, and meat to 6 oz (170 g) each day.  Limit sweets, desserts, sugars, and sugary drinks.  Choose heart-healthy fats.  Limit cheese to 1 oz (28 g) per day.  Eat more home-cooked food and less restaurant, buffet, and fast food.  Limit fried foods.  Cook foods using methods other than frying.  Limit canned vegetables. If you do use them, rinse them well to decrease the sodium.  When eating at a restaurant, ask that your food be prepared with less salt, or no salt if possible. WHAT FOODS CAN I EAT? Seek help from a dietitian for individual calorie needs. Grains Whole grain or whole wheat bread. Brown rice. Whole grain or whole wheat pasta. Quinoa, bulgur, and whole grain cereals. Low-sodium cereals. Corn or whole wheat flour tortillas. Whole grain cornbread. Whole grain crackers. Low-sodium crackers. Vegetables Fresh or frozen vegetables (raw, steamed, roasted, or grilled). Low-sodium or reduced-sodium tomato and vegetable juices. Low-sodium or reduced-sodium tomato sauce and paste. Low-sodium or reduced-sodium canned vegetables.  Fruits All fresh, canned (in natural juice), or frozen fruits. Meat and Other Protein Products Ground beef (85% or leaner), grass-fed beef, or beef trimmed of fat. Skinless chicken or tu[place]ound chicken or tu[place]ork trimmed of fat. All fish and seafood. Eggs. Dried beans, peas, or lentils. Unsalted nuts and seeds. Unsalted canned beans. Dairy Low-fat dairy products, such as skim or 1% milk, 2% or reduced-fat cheeses, low-fat ricotta  or cottage cheese, or plain low-fat yogurt. Low-sodium or reduced-sodium cheeses. Fats and Oils Tub margarines without trans fats. Light or reduced-fat mayonnaise and salad dressings (reduced sodium). Avocado. Safflower, olive, or canola oils. Natural peanut or almond butter. Other Unsalted popcorn and pretzels. The items listed above may not be a complete list of recommended foods or beverages.  Contact your dietitian for more options. WHAT FOODS ARE NOT RECOMMENDED? Grains White bread. White pasta. White rice. Refined cornbread. Bagels and croissants. Crackers that contain trans fat. Vegetables Creamed or fried vegetables. Vegetables in a cheese sauce. Regular canned vegetables. Regular canned tomato sauce and paste. Regular tomato and vegetable juices. Fruits Dried fruits. Canned fruit in light or heavy syrup. Fruit juice. Meat and Other Protein Products Fatty cuts of meat. Ribs, chicken wings, bacon, sausage, bologna, salami, chitterlings, fatback, hot dogs, bratwurst, and packaged luncheon meats. Salted nuts and seeds. Canned beans with salt. Dairy Whole or 2% milk, cream, half-and-half, and cream cheese. Whole-fat or sweetened yogurt. Full-fat cheeses or blue cheese. Nondairy creamers and whipped toppings. Processed cheese, cheese spreads, or cheese curds. Condiments Onion and garlic salt, seasoned salt, table salt, and sea salt. Canned and packaged gravies. Worcestershire sauce. Tartar sauce. Barbecue sauce. Teriyaki sauce. Soy sauce, including reduced sodium. Steak sauce. Fish sauce. Oyster sauce. Cocktail sauce. Horseradish. Ketchup and mustard. Meat flavorings and tenderizers. Bouillon cubes. Hot sauce. Tabasco sauce. Marinades. Taco seasonings. Relishes. Fats and Oils Butter, stick margarine, lard, shortening, ghee, and bacon fat. Coconut, palm kernel, or palm oils. Regular salad dressings. Other Pickles and olives. Salted popcorn and pretzels. The items listed above may not be a complete list of foods and beverages to avoid. Contact your dietitian for more information. WHERE CAN I FIND MORE INFORMATION? National Heart, Lung, and Blood Institute: travelstabloid.com Document Released: 07/02/2011 Document Revised: 11/27/2013 Document Reviewed: 05/17/2013 [facility] Patient Information 2015 [place], [place]. This information is not intended to  replace advice given to you by your health care provider. Make sure you discuss any questions you have with your health care provider.

## 2015-05-31 NOTE — Progress Notes (Signed)
Assessment and Plan: Depression/anxiety- add on celexa 64m Hypertension, do just 1/2 of lisinopril daily and monitor BP Emotional liability/nausea/intermitent facial numbness/eye drooping/tremor- get MRI brain, rule out stroke/mass, add bASA.    HPI 64y.o.female presents for 64monthollow up. Last visit due to hyponatremia, stopped her HCTZ and increase lisinopril to 4064maily.  BP has been controlled but has been low at 106/83. She will take 1/2 of the glyburide in the AM and 1 with dinner since she eats more at night, not able to eat before 12. Has started on bASA. Had normal CXR 03/2015. She has been having increased crying, increased stress/anxiety and some depression.   She has been having some crying, emotional liability, has had episodes of shaking, nausea, and has had sharp left sided temple pain, with left eye drooping/facial numbness.  Lab Results  Component Value Date   CREATININE 0.63 64/10/2014   BUN 8 04/30/2015   NA 131* 04/30/2015   K 4.1 04/30/2015   CL 94* 04/30/2015   CO2 29 04/30/2015    Past Medical History  Diagnosis Date  . Diabetes mellitus without complication ([place] . Anxiety   . Hypertension   . Nonalcoholic hepatosteatosis   . Unspecified vitamin D deficiency   . Hyperlipidemia   . [place]   [place] 2013     Allergies  Allergen Reactions  . Metformin And Related     ELEVATED BS. GLYBURIDE, ONGLYZA, ACTOS, INVOKANA  . Insulins Rash  . Janumet [Sitagliptin-Metformin Hcl] Rash    Shakes/ rash  . Penicillins Rash      Current Outpatient Prescriptions on File Prior to Visit  Medication Sig Dispense Refill  . ALPRAZolam (XANAX) 1 MG tablet 1/2 to 1 tablet 2 to 3 x daily as needed for nerves 90 tablet 3  . glimepiride (AMARYL) 4 MG tablet Take 1 tablet (4 mg total) by mouth daily with breakfast. 90 tablet 3  . lisinopril (PRINIVIL,ZESTRIL) 40 MG tablet Take 1 tablet (40 mg total) by mouth daily. 90 tablet 3  . Multiple  Vitamins-Minerals (STRESS TAB NF PO) Take by mouth.    . OMarland KitchenER THE COUNTER MEDICATION percogesic 4 tabs daily for pain 325 mg/ 12.5 mg     No current facility-administered medications on file prior to visit.    ROS: all negative except above.   Physical Exam: Filed Weights   05/31/63 0958  Weight: 138 lb (64.596 kg)   BP 110/80 mmHg  Pulse 101  Temp(Src) 97.5 F (36.4 C) (Temporal)  Resp 14  Ht 4' 11"  (1.499 m)  Wt 138 lb (64.596 kg)  BMI 27.86 kg/m2  SpO2 95% General Appearance: Well nourished, in no apparent distress. Eyes: PERRLA, EOMs, conjunctiva no swelling or erythema Sinuses: No Frontal/maxillary tenderness ENT/Mouth: Ext aud canals clear, TMs without erythema, bulging. No erythema, swelling, or exudate on post pharynx.  Tonsils not swollen or erythematous. Hearing normal.  Neck: Supple, thyroid normal.  Respiratory: Respiratory effort normal, BS equal bilaterally without rales, rhonchi, wheezing or stridor.  Cardio: RRR with no MRGs. Brisk peripheral pulses without edema.  Abdomen: Soft, + BS.  Non tender, no guarding, rebound, hernias, masses. Lymphatics: Non tender without lymphadenopathy.  Musculoskeletal: Full ROM, 5/5 strength, normal gait.  Skin: Warm, dry without rashes, lesions, ecchymosis.  Neuro: slightly tremor, horizontal nystagmus in left eye, no cranial nerve deficeits, 4/5 strength BUE and BLE, no pronator drift, slightly abnormal finger to nose, + romberg Psych: Awake and oriented  X 3, normal affect, Insight and Judgment appropriate.     Vicie Mutters, PA-C 10:16 AM [facility]

## 2015-06-08 ENCOUNTER — Ambulatory Visit
Admission: RE | Admit: 2015-06-08 | Discharge: 2015-06-08 | Disposition: A | Discharge status: Home / Self Care | Payer: BLUE CROSS/BLUE SHIELD | Source: Ambulatory Visit | Attending: Physician Assistant | Admitting: Physician Assistant

## 2015-06-08 DIAGNOSIS — I10 Essential (primary) hypertension: Secondary | ICD-10-CM

## 2015-06-08 DIAGNOSIS — R251 Tremor, unspecified: Secondary | ICD-10-CM

## 2015-06-08 DIAGNOSIS — R200 Anesthesia of skin: Secondary | ICD-10-CM

## 2015-06-08 DIAGNOSIS — R110 Nausea: Secondary | ICD-10-CM

## 2015-06-18 ENCOUNTER — Ambulatory Visit: Payer: PPO

## 2015-06-18 VITALS — BP 140/100 | HR 67 | Temp 98.2°F | Resp 16 | Ht 59.0 in | Wt 140.0 lb

## 2015-06-18 DIAGNOSIS — I10 Essential (primary) hypertension: Secondary | ICD-10-CM

## 2015-06-18 LAB — BASIC METABOLIC PANEL WITH GFR
BUN: 11 mg/dL (ref 7–25)
CALCIUM: 8.9 mg/dL (ref 8.6–10.4)
CHLORIDE: 93 mmol/L — AB (ref 98–110)
CO2: 27 mmol/L (ref 20–31)
CREATININE: 0.67 mg/dL (ref 0.50–0.99)
GFR, Est African American: >89 mL/min (ref >=60)
Glucose, Bld: 259 mg/dL — ABNORMAL HIGH (ref 65–99)
Potassium: 4.5 mmol/L (ref 3.5–5.3)
SODIUM: 130 mmol/L — AB (ref 135–146)

## 2015-11-05 ENCOUNTER — Other Ambulatory Visit: Payer: Self-pay | Admitting: Internal Medicine

## 2015-11-05 NOTE — Telephone Encounter (Signed)
RX CALLED INTO [facility].

## 2015-11-12 ENCOUNTER — Ambulatory Visit: Payer: Self-pay | Admitting: Internal Medicine

## 2015-11-12 ENCOUNTER — Encounter: Payer: Self-pay | Admitting: Internal Medicine

## 2015-11-12 VITALS — BP 154/82 | HR 92 | Temp 98.0°F | Resp 18 | Ht 59.0 in | Wt 139.0 lb

## 2015-11-12 DIAGNOSIS — Z9114 Patient's other noncompliance with medication regimen: Secondary | ICD-10-CM

## 2015-11-12 DIAGNOSIS — E559 Vitamin D deficiency, unspecified: Secondary | ICD-10-CM

## 2015-11-12 DIAGNOSIS — E1151 Type 2 diabetes mellitus with diabetic peripheral angiopathy without gangrene: Secondary | ICD-10-CM

## 2015-11-12 DIAGNOSIS — Z91148 Patient's other noncompliance with medication regimen for other reason: Secondary | ICD-10-CM

## 2015-11-12 DIAGNOSIS — J449 Chronic obstructive pulmonary disease, unspecified: Secondary | ICD-10-CM

## 2015-11-12 DIAGNOSIS — E785 Hyperlipidemia, unspecified: Secondary | ICD-10-CM

## 2015-11-12 DIAGNOSIS — I10 Essential (primary) hypertension: Secondary | ICD-10-CM

## 2015-11-12 DIAGNOSIS — Z79899 Other long term (current) drug therapy: Secondary | ICD-10-CM

## 2015-11-12 DIAGNOSIS — I700 Atherosclerosis of aorta: Secondary | ICD-10-CM

## 2015-11-12 LAB — CBC WITH DIFFERENTIAL/PLATELET
BASOS ABS: 92 {cells}/uL (ref 0–200)
Basophils Relative: 1 %
Eosinophils Absolute: 460 cells/uL (ref 15–500)
Eosinophils Relative: 5 %
HEMATOCRIT: 44.5 % (ref 35.0–45.0)
HEMOGLOBIN: 15.1 g/dL (ref 11.7–15.5)
LYMPHS ABS: 2760 {cells}/uL (ref 850–3900)
LYMPHS PCT: 30 %
MCH: 29.8 pg (ref 27.0–33.0)
MCHC: 33.9 g/dL (ref 32.0–36.0)
MCV: 87.8 fL (ref 80.0–100.0)
MONO ABS: 552 {cells}/uL (ref 200–950)
MPV: 10.3 fL (ref 7.5–12.5)
Monocytes Relative: 6 %
NEUTROS PCT: 58 %
Neutro Abs: 5336 cells/uL (ref 1500–7800)
Platelets: 250 10*3/uL (ref 140–400)
RBC: 5.07 MIL/uL (ref 3.80–5.10)
RDW: 13.1 % (ref 11.0–15.0)
WBC: 9.2 10*3/uL (ref 3.8–10.8)

## 2015-11-12 LAB — HEMOGLOBIN A1C
Hgb A1c MFr Bld: 10.2 % — ABNORMAL HIGH (ref <5.7)
MEAN PLASMA GLUCOSE: 246 mg/dL

## 2015-11-12 MED ORDER — TRIAMCINOLONE ACETONIDE 0.1 % EX CREA: 1.0000 "application " | TOPICAL_CREAM | Freq: Three times a day (TID) | CUTANEOUS | Status: DC

## 2015-11-12 MED ORDER — AZITHROMYCIN 250 MG PO TABS: ORAL_TABLET | ORAL | Status: DC

## 2015-11-12 NOTE — Progress Notes (Signed)
Assessment and Plan:  Hypertension:  -Continue medication,  -monitor blood pressure at home.  -Continue DASH diet.   -Reminder to go to the ER if any CP, SOB, nausea, dizziness, severe HA, changes vision/speech, left arm numbness and tingling, and jaw pain.  Cholesterol: -Continue diet and exercise.  -Check cholesterol.   Pre-diabetes: -Continue diet and exercise.  -Check A1C  Vitamin D Def: -check level -continue medications.   Rib pain -self insured currently -try zpak -if no improvement than consider CXR  Continue diet and meds as discussed. Further disposition pending results of labs.  HPI 65 y.o. female  presents for 3 month follow up with hypertension, hyperlipidemia, prediabetes and vitamin D.   Her blood pressure has been controlled at home, today their BP is BP: (!) 154/82 mmHg.   She does not workout. She denies chest pain, shortness of breath, dizziness.   She is not on cholesterol medication and denies myalgias. Her cholesterol is not at goal. The cholesterol last visit was:   Lab Results  Component Value Date   CHOL 248* 04/30/2015   HDL 38* 04/30/2015   LDLCALC 162* 04/30/2015   TRIG 242* 04/30/2015   CHOLHDL 6.5* 04/30/2015     She has not been working on diet and exercise for prediabetes, and denies foot ulcerations, hyperglycemia, hypoglycemia , increased appetite, nausea, paresthesia of the feet, polydipsia, polyuria, visual disturbances, vomiting and weight loss. Last A1C in the office was:  Lab Results  Component Value Date   HGBA1C 8.9* 04/30/2015    Patient is on Vitamin D supplement.  Lab Results  Component Value Date   VD25OH 11* 04/30/2015     Patient reports that she has had a lot of coughing and rib pain which has been bothering her since the allergy season started.  She has pain in the left lower ribs where she had a previous injury.  She has been using horse lenament and has been paragesics.  She is taking nyquil.  It helps with cough  and uses clear lungs.    Current Medications:  Current Outpatient Prescriptions on File Prior to Visit  Medication Sig Dispense Refill  . ALPRAZolam (XANAX) 1 MG tablet TAKE 1/2 TO 1 TABLET TWO TO THREE TIMES DAILY AS NEEDED FOR NERVOUSNESS. 90 tablet 0  . citalopram (CELEXA) 20 MG tablet Take 1 tablet (20 mg total) by mouth daily. 30 tablet 2  . glimepiride (AMARYL) 4 MG tablet Take 1 tablet (4 mg total) by mouth daily with breakfast. 90 tablet 3  . lisinopril (PRINIVIL,ZESTRIL) 40 MG tablet Take 1 tablet (40 mg total) by mouth daily. 90 tablet 3  . Multiple Vitamins-Minerals (STRESS TAB NF PO) Take by mouth.    Marland Kitchen OVER THE COUNTER MEDICATION percogesic 4 tabs daily for pain 325 mg/ 12.5 mg     No current facility-administered medications on file prior to visit.    Medical History:  Past Medical History  Diagnosis Date  . Diabetes mellitus without complication ([place])   . Anxiety   . Hypertension   . Nonalcoholic hepatosteatosis   . Unspecified vitamin D deficiency   . Hyperlipidemia   . Atherosclerosis of aorta ([place])     via CXR 2013    Allergies:  Allergies  Allergen Reactions  . Metformin And Related     ELEVATED BS. GLYBURIDE, ONGLYZA, ACTOS, INVOKANA  . Insulins Rash  . Janumet [Sitagliptin-Metformin Hcl] Rash    Shakes/ rash  . Penicillins Rash     Review of Systems:  Review of Systems  Constitutional: Negative for fever, chills and malaise/fatigue.  HENT: Positive for congestion. Negative for ear pain and sore throat.   Respiratory: Positive for cough. Negative for shortness of breath and wheezing.   Cardiovascular: Positive for chest pain. Negative for palpitations and leg swelling.  Gastrointestinal: Negative for heartburn, abdominal pain, diarrhea, constipation, blood in stool and melena.  Genitourinary: Negative.   Skin: Negative.   Neurological: Negative for dizziness, sensory change, loss of consciousness and headaches.  Psychiatric/Behavioral: Negative for  depression. The patient is not nervous/anxious and does not have insomnia.     Family history- Review and unchanged  Social history- Review and unchanged  Physical Exam: BP 154/82 mmHg  Pulse 92  Temp(Src) 98 F (36.7 C) (Temporal)  Resp 18  Ht 4' 11"  (1.499 m)  Wt 139 lb (63.05 kg)  BMI 28.06 kg/m2 Wt Readings from Last 3 Encounters:  11/12/15 139 lb (63.05 kg)  06/18/15 140 lb (63.504 kg)  05/31/15 138 lb (62.596 kg)    General Appearance: Well nourished well developed, in no apparent distress. Eyes: PERRLA, EOMs, conjunctiva no swelling or erythema ENT/Mouth: Ear canals normal without obstruction, swelling, erythma, discharge.  TMs normal bilaterally.  Oropharynx moist, clear, without exudate, or postoropharyngeal swelling. Neck: Supple, thyroid normal,no cervical adenopathy  Respiratory: Respiratory effort normal, Breath sounds clear A&P without rhonchi, wheeze, or rale.  No retractions, no accessory usage.  Left sided rib tenderness to palpation.   Cardio: RRR with no MRGs. Brisk peripheral pulses without edema.  Abdomen: Soft, + BS,  Non tender, no guarding, rebound, hernias, masses. Musculoskeletal: Full ROM, 5/5 strength, Normal gait Skin: Warm, dry without rashes, lesions, ecchymosis.  Neuro: Awake and oriented X 3, Cranial nerves intact. Normal muscle tone, no cerebellar symptoms. Psych: Normal affect, Insight and Judgment appropriate.    Starlyn Skeans, PA-C 4:27 PM [facility]

## 2015-11-13 LAB — LIPID PANEL
CHOL/HDL RATIO: 6 ratio — AB (ref <=5.0)
CHOLESTEROL: 245 mg/dL — AB (ref 125–200)
HDL: 41 mg/dL — ABNORMAL LOW (ref >=46)
LDL Cholesterol: 156 mg/dL — ABNORMAL HIGH (ref <130)
Triglycerides: 241 mg/dL — ABNORMAL HIGH (ref <150)
VLDL: 48 mg/dL — ABNORMAL HIGH (ref <30)

## 2015-11-13 LAB — HEPATIC FUNCTION PANEL
ALBUMIN: 4 g/dL (ref 3.6–5.1)
ALT: 25 U/L (ref 6–29)
AST: 24 U/L (ref 10–35)
Alkaline Phosphatase: 119 U/L (ref 33–130)
Bilirubin, Direct: 0.1 mg/dL (ref <=0.2)
Indirect Bilirubin: 0.2 mg/dL (ref 0.2–1.2)
TOTAL PROTEIN: 7.2 g/dL (ref 6.1–8.1)
Total Bilirubin: 0.3 mg/dL (ref 0.2–1.2)

## 2015-11-13 LAB — BASIC METABOLIC PANEL WITH GFR
BUN: 12 mg/dL (ref 7–25)
CALCIUM: 9.5 mg/dL (ref 8.6–10.4)
CO2: 28 mmol/L (ref 20–31)
CREATININE: 0.72 mg/dL (ref 0.50–0.99)
Chloride: 94 mmol/L — ABNORMAL LOW (ref 98–110)
GFR, Est Non African American: 89 mL/min (ref >=60)
GLUCOSE: 203 mg/dL — AB (ref 65–99)
Potassium: 4.5 mmol/L (ref 3.5–5.3)
Sodium: 131 mmol/L — ABNORMAL LOW (ref 135–146)

## 2015-11-13 LAB — TSH: TSH: 0.71 m[IU]/L

## 2015-12-19 ENCOUNTER — Other Ambulatory Visit: Payer: Self-pay | Admitting: Internal Medicine

## 2015-12-19 MED ORDER — ALPRAZOLAM 1 MG PO TABS: 1.0000 mg | ORAL_TABLET | Freq: Three times a day (TID) | ORAL | Status: DC | PRN

## 2016-02-12 ENCOUNTER — Ambulatory Visit: Payer: Self-pay | Admitting: Internal Medicine

## 2016-02-12 ENCOUNTER — Encounter: Payer: Self-pay | Admitting: Internal Medicine

## 2016-02-12 VITALS — BP 128/62 | HR 88 | Temp 98.2°F | Resp 18 | Ht 59.0 in | Wt 132.0 lb

## 2016-02-12 DIAGNOSIS — I700 Atherosclerosis of aorta: Secondary | ICD-10-CM

## 2016-02-12 DIAGNOSIS — E1165 Type 2 diabetes mellitus with hyperglycemia: Secondary | ICD-10-CM

## 2016-02-12 DIAGNOSIS — Z9114 Patient's other noncompliance with medication regimen: Secondary | ICD-10-CM

## 2016-02-12 DIAGNOSIS — Z79899 Other long term (current) drug therapy: Secondary | ICD-10-CM

## 2016-02-12 DIAGNOSIS — E785 Hyperlipidemia, unspecified: Secondary | ICD-10-CM

## 2016-02-12 DIAGNOSIS — I10 Essential (primary) hypertension: Secondary | ICD-10-CM

## 2016-02-12 DIAGNOSIS — R110 Nausea: Secondary | ICD-10-CM

## 2016-02-12 DIAGNOSIS — E1151 Type 2 diabetes mellitus with diabetic peripheral angiopathy without gangrene: Secondary | ICD-10-CM

## 2016-02-12 DIAGNOSIS — E559 Vitamin D deficiency, unspecified: Secondary | ICD-10-CM

## 2016-02-12 LAB — HEMOGLOBIN A1C
Hgb A1c MFr Bld: 10.6 % — ABNORMAL HIGH (ref <5.7)
Mean Plasma Glucose: 258 mg/dL

## 2016-02-12 MED ORDER — INSULIN NPH ISOPHANE & REGULAR (70-30) 100 UNIT/ML ~~LOC~~ SUSP: SUBCUTANEOUS | Status: DC

## 2016-02-12 MED ORDER — PROMETHAZINE HCL 25 MG PO TABS: 12.5000 mg | ORAL_TABLET | Freq: Four times a day (QID) | ORAL | Status: DC | PRN

## 2016-02-12 MED ORDER — NEEDLES & SYRINGES MISC: Status: DC

## 2016-02-12 MED ORDER — MELOXICAM 15 MG PO TABS: 15.0000 mg | ORAL_TABLET | Freq: Every day | ORAL | Status: DC

## 2016-02-12 NOTE — Patient Instructions (Signed)
Please use 15 units in the mornings with your breakfast.  Please try to make sure you take this by 10 in the mornings.  Please check your sugars before you eat.    Please take 10 units of insulin in the evenings with your dinner.  Try to check your sugars up to 3 times per day.  Please call the office if you are having sugars less than 70.  Please try taking the meloxicam once daily.  You must take this with food.  If you don't you will get an ulcer in your stomach.    Please take the phenergan as needed to help with nausea.  You can take this up to 3 times per day.

## 2016-02-12 NOTE — Progress Notes (Signed)
Assessment and Plan:  Hypertension:  -cont lisinopril -Continue medication -monitor blood pressure at home. -Continue DASH diet -Reminder to go to the ER if any CP, SOB, nausea, dizziness, severe HA, changes vision/speech, left arm numbness and tingling and jaw pain.  Cholesterol -self pay -cannot check -do recommend starting on medication in September once patient is on medicare - Continue diet and exercise -Check cholesterol.   Diabetes with diabetic chronic kidney disease  -stop all oral meds -novolin 15 units in the morning with breakfast 10 units in the evening -CBG log twice daily at least -recheck in 2 months -Continue diet and exercise.  -Check A1C  Vitamin D Def -continue medications.   Nausea -phenergan tablets  Non-compliance with medication routine -due to side effects  Continue diet and meds as discussed. Further disposition pending results of labs. Discussed med's effects and SE's.    HPI 65 y.o. female  presents for 3 month follow up with hypertension, hyperlipidemia, diabetes and vitamin D deficiency.   Her blood pressure has been controlled at home, today their BP is BP: 128/62 mmHg.She does workout. She denies chest pain, shortness of breath, dizziness.   She is not on cholesterol medication and denies myalgias. Her cholesterol is at goal. The cholesterol was:  11/12/2015: Cholesterol 245*; HDL 41*; LDL Cholesterol 156*; Triglycerides 241*   She has been working on diet and exercise for diabetes with diabetic chronic kidney disease, she is on bASA, she is on ACE/ARB, and denies  foot ulcerations, hypoglycemia , increased appetite, nausea, paresthesia of the feet, polydipsia, polyuria, visual disturbances, vomiting and weight loss. Last A1C was: 11/12/2015: Hgb A1c MFr Bld 10.2*.  Patient reports that she has not been taking glyburide because she is getting foot swelling and also abdominal pain with the medication.  She is taking invokana which gives her issues  with skin rashes.  She is taking 150 mg of invokana.  She has not eaten anything at this time.  She reports that her joints and her back are aching.  She reports that she refuses to take metformin.  Her blood sugars at home are 300.  She has been working on her diet.     Patient is on Vitamin D supplement. 04/30/2015: Vit D, 25-Hydroxy 11*   Current Medications:  Current Outpatient Prescriptions on File Prior to Visit  Medication Sig Dispense Refill  . ALPRAZolam (XANAX) 1 MG tablet Take 1 tablet (1 mg total) by mouth 3 (three) times daily as needed for anxiety. 90 tablet 1  . lisinopril (PRINIVIL,ZESTRIL) 40 MG tablet Take 1 tablet (40 mg total) by mouth daily. 90 tablet 3  . Multiple Vitamins-Minerals (STRESS TAB NF PO) Take by mouth.    Marland Kitchen OVER THE COUNTER MEDICATION percogesic 4 tabs daily for pain 325 mg/ 12.5 mg    . triamcinolone cream (KENALOG) 0.1 % Apply 1 application topically 3 (three) times daily. 30 g 0   No current facility-administered medications on file prior to visit.   Medical History:  Past Medical History  Diagnosis Date  . Diabetes mellitus without complication ([place])   . Anxiety   . Hypertension   . Nonalcoholic hepatosteatosis   . Unspecified vitamin D deficiency   . Hyperlipidemia   . Atherosclerosis of aorta ([place])     via CXR 2013   Allergies:  Allergies  Allergen Reactions  . Metformin And Related     ELEVATED BS. GLYBURIDE, ONGLYZA, ACTOS, INVOKANA  . Insulins Rash  . Janumet [Sitagliptin-Metformin Hcl] Rash  Shakes/ rash  . Penicillins Rash     Review of Systems:  Review of Systems  Constitutional: Negative for fever, chills and malaise/fatigue.  HENT: Negative for congestion, ear pain and sore throat.   Eyes: Negative.   Respiratory: Negative for cough, shortness of breath and wheezing.   Cardiovascular: Negative for chest pain, palpitations and leg swelling.  Gastrointestinal: Positive for nausea. Negative for heartburn, vomiting,  abdominal pain, diarrhea, constipation, blood in stool and melena.  Genitourinary: Negative.   Skin: Negative.   Neurological: Negative for dizziness, sensory change, loss of consciousness and headaches.  Psychiatric/Behavioral: Negative for depression. The patient is not nervous/anxious and does not have insomnia.     Family history- Review and unchanged  Social history- Review and unchanged  Physical Exam: BP 128/62 mmHg  Pulse 88  Temp(Src) 98.2 F (36.8 C) (Temporal)  Resp 18  Ht 4' 11"  (1.499 m)  Wt 132 lb (59.875 kg)  BMI 26.65 kg/m2 Wt Readings from Last 3 Encounters:  02/12/16 132 lb (59.875 kg)  11/12/15 139 lb (63.05 kg)  06/18/15 140 lb (63.504 kg)   General Appearance: Well nourished well developed, non-toxic appearing, in no apparent distress. Eyes: PERRLA, EOMs, conjunctiva no swelling or erythema ENT/Mouth: Ear canals clear with no erythema, swelling, or discharge.  TMs normal bilaterally, oropharynx clear, moist, with no exudate.   Neck: Supple, thyroid normal, no JVD, no cervical adenopathy.  Respiratory: Respiratory effort normal, breath sounds clear A&P, no wheeze, rhonchi or rales noted.  No retractions, no accessory muscle usage Cardio: RRR with no MRGs. No noted edema.  Abdomen: Soft, + BS.  Non tender, no guarding, rebound, hernias, masses. Musculoskeletal: Full ROM, 5/5 strength, Normal gait Skin: Warm, dry without rashes, lesions, ecchymosis.  Neuro: Awake and oriented X 3, Cranial nerves intact. No cerebellar symptoms.  Psych: normal affect, Insight and Judgment appropriate.    Starlyn Skeans, PA-C 2:33 PM [facility]

## 2016-02-13 LAB — BASIC METABOLIC PANEL WITH GFR
BUN: 12 mg/dL (ref 7–25)
CHLORIDE: 94 mmol/L — AB (ref 98–110)
CO2: 24 mmol/L (ref 20–31)
Calcium: 9.5 mg/dL (ref 8.6–10.4)
Creat: 0.74 mg/dL (ref 0.50–0.99)
GFR, Est African American: >89 mL/min (ref >=60)
GFR, Est Non African American: 86 mL/min (ref >=60)
GLUCOSE: 122 mg/dL — AB (ref 65–99)
POTASSIUM: 4.5 mmol/L (ref 3.5–5.3)
Sodium: 131 mmol/L — ABNORMAL LOW (ref 135–146)

## 2016-02-19 ENCOUNTER — Telehealth: Payer: Self-pay | Admitting: *Deleted

## 2016-02-19 NOTE — Telephone Encounter (Signed)
Patient was given insulin Rx 02/12/16 to help control uncontrolled BS. Patient was given detailed instructions on insulin dosing per Starlyn Skeans, PA-C orders.  Patient expressed understanding at [place] 02/12/16.  Patient called 02/17/16 with BS reading log.  She states the insulin "was causing blurred vision"  And she felt as though her BS were still elevated and not controlled well.  Per Starlyn Skeans, PA-C orders, patient was called and advised to continue insulin AD and will take a few weeks to adjust her BS readings.  Pt was advised her "blurred vision" was more than likely not related to the insulin.  Patient informed me that she d/c her insulin 02/15/16 and has been taking left over samples of Invokana 150 mg 1 pill QD.   She states her BS readings have been "somewhat controlled" with Invokana.  Per Starlyn Skeans, PA-C orders, patient will need to be referred to Endocrine as soon as she gets Medicare in September for continued Tx and better control of her diabetes.

## 2016-02-27 ENCOUNTER — Ambulatory Visit: Payer: Self-pay | Admitting: Internal Medicine

## 2016-03-02 ENCOUNTER — Ambulatory Visit: Payer: Self-pay | Admitting: Internal Medicine

## 2016-03-10 ENCOUNTER — Other Ambulatory Visit: Payer: Self-pay | Admitting: Internal Medicine

## 2016-03-11 ENCOUNTER — Telehealth: Payer: Self-pay

## 2016-03-11 NOTE — Telephone Encounter (Signed)
[place] [facility].

## 2016-04-25 DIAGNOSIS — Z720 Tobacco use: Secondary | ICD-10-CM | POA: Diagnosis not present

## 2016-04-25 DIAGNOSIS — J441 Chronic obstructive pulmonary disease with (acute) exacerbation: Secondary | ICD-10-CM | POA: Diagnosis not present

## 2016-04-30 ENCOUNTER — Encounter: Payer: Self-pay | Admitting: Physician Assistant

## 2016-04-30 ENCOUNTER — Ambulatory Visit (INDEPENDENT_AMBULATORY_CARE_PROVIDER_SITE_OTHER): Payer: PPO | Admitting: Physician Assistant

## 2016-04-30 ENCOUNTER — Other Ambulatory Visit: Payer: Self-pay | Admitting: Physician Assistant

## 2016-04-30 VITALS — BP 142/64 | HR 95 | Temp 97.2°F | Resp 18 | Ht 59.0 in | Wt 137.8 lb

## 2016-04-30 DIAGNOSIS — E1151 Type 2 diabetes mellitus with diabetic peripheral angiopathy without gangrene: Secondary | ICD-10-CM

## 2016-04-30 DIAGNOSIS — R6889 Other general symptoms and signs: Secondary | ICD-10-CM | POA: Diagnosis not present

## 2016-04-30 DIAGNOSIS — Z0000 Encounter for general adult medical examination without abnormal findings: Secondary | ICD-10-CM

## 2016-04-30 DIAGNOSIS — E559 Vitamin D deficiency, unspecified: Secondary | ICD-10-CM | POA: Diagnosis not present

## 2016-04-30 DIAGNOSIS — Z79899 Other long term (current) drug therapy: Secondary | ICD-10-CM

## 2016-04-30 DIAGNOSIS — I10 Essential (primary) hypertension: Secondary | ICD-10-CM

## 2016-04-30 DIAGNOSIS — Z0001 Encounter for general adult medical examination with abnormal findings: Secondary | ICD-10-CM

## 2016-04-30 DIAGNOSIS — E785 Hyperlipidemia, unspecified: Secondary | ICD-10-CM | POA: Diagnosis not present

## 2016-04-30 DIAGNOSIS — J441 Chronic obstructive pulmonary disease with (acute) exacerbation: Secondary | ICD-10-CM

## 2016-04-30 DIAGNOSIS — I700 Atherosclerosis of aorta: Secondary | ICD-10-CM | POA: Diagnosis not present

## 2016-04-30 DIAGNOSIS — Z136 Encounter for screening for cardiovascular disorders: Secondary | ICD-10-CM | POA: Diagnosis not present

## 2016-04-30 DIAGNOSIS — R1013 Epigastric pain: Secondary | ICD-10-CM | POA: Diagnosis not present

## 2016-04-30 DIAGNOSIS — K760 Fatty (change of) liver, not elsewhere classified: Secondary | ICD-10-CM | POA: Diagnosis not present

## 2016-04-30 DIAGNOSIS — D649 Anemia, unspecified: Secondary | ICD-10-CM

## 2016-04-30 DIAGNOSIS — J449 Chronic obstructive pulmonary disease, unspecified: Secondary | ICD-10-CM

## 2016-04-30 DIAGNOSIS — E2839 Other primary ovarian failure: Secondary | ICD-10-CM

## 2016-04-30 DIAGNOSIS — E1165 Type 2 diabetes mellitus with hyperglycemia: Secondary | ICD-10-CM | POA: Diagnosis not present

## 2016-04-30 DIAGNOSIS — Z9114 Patient's other noncompliance with medication regimen: Secondary | ICD-10-CM | POA: Diagnosis not present

## 2016-04-30 DIAGNOSIS — F419 Anxiety disorder, unspecified: Secondary | ICD-10-CM

## 2016-04-30 DIAGNOSIS — R350 Frequency of micturition: Secondary | ICD-10-CM | POA: Diagnosis not present

## 2016-04-30 DIAGNOSIS — F17200 Nicotine dependence, unspecified, uncomplicated: Secondary | ICD-10-CM

## 2016-04-30 LAB — CBC WITH DIFFERENTIAL/PLATELET
BASOS PCT: 1 %
Basophils Absolute: 96 cells/uL (ref 0–200)
EOS ABS: 480 {cells}/uL (ref 15–500)
Eosinophils Relative: 5 %
HCT: 43.1 % (ref 35.0–45.0)
Hemoglobin: 14.7 g/dL (ref 11.7–15.5)
Lymphocytes Relative: 28 %
Lymphs Abs: 2688 cells/uL (ref 850–3900)
MCH: 30 pg (ref 27.0–33.0)
MCHC: 34.1 g/dL (ref 32.0–36.0)
MCV: 88 fL (ref 80.0–100.0)
MONOS PCT: 6 %
MPV: 9.9 fL (ref 7.5–12.5)
Monocytes Absolute: 576 cells/uL (ref 200–950)
NEUTROS ABS: 5760 {cells}/uL (ref 1500–7800)
Neutrophils Relative %: 60 %
PLATELETS: 272 10*3/uL (ref 140–400)
RBC: 4.9 MIL/uL (ref 3.80–5.10)
RDW: 13.2 % (ref 11.0–15.0)
WBC: 9.6 10*3/uL (ref 3.8–10.8)

## 2016-04-30 LAB — TSH: TSH: 0.96 mIU/L

## 2016-04-30 MED ORDER — AZITHROMYCIN 250 MG PO TABS: ORAL_TABLET | ORAL | 1 refills | Status: AC

## 2016-04-30 MED ORDER — LINAGLIPTIN 5 MG PO TABS: 5.0000 mg | ORAL_TABLET | Freq: Every day | ORAL | 3 refills | Status: DC

## 2016-04-30 MED ORDER — LISINOPRIL 40 MG PO TABS: 40.0000 mg | ORAL_TABLET | Freq: Every day | ORAL | 3 refills | Status: DC

## 2016-04-30 NOTE — Progress Notes (Signed)
MEDICARE ANNUAL WELLNESS VISIT AND CPE  Assessment:   1. Essential hypertension - continue medications, DASH diet, exercise and monitor at home. Call if greater than 130/80.  - Stay off fluid pill, no swelling in legs and + hyponatremia.  - CBC with Differential/Platelet - BASIC METABOLIC PANEL WITH GFR - Hepatic function panel - TSH - Urinalysis, Routine w reflex microscopic (not at [facility]) - Microalbumin / creatinine urine ratio - EKG 12-Lead - [place], RETROPERITNL ABD,  LTD - DG Chest 2 View; Future - lisinopril (PRINIVIL,ZESTRIL) 40 MG tablet; Take 1 tablet (40 mg total) by mouth daily.  Dispense: 90 tablet; Refill: 3  2. 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 Advised to quit smoking - Hemoglobin A1c - Urinalysis, Routine w reflex microscopic (not at [facility]) - Microalbumin / creatinine urine ratio - linagliptin (TRADJENTA) 5 MG TABS tablet; Take 1 tablet (5 mg total) by mouth daily.  Dispense: 30 tablet; Refill: 3  3. Chronic obstructive pulmonary disease, unspecified COPD type ([place]) Advised to quit smoking.   4. T2_NIDDM, poorly controlled Discussed general issues about diabetes pathophysiology and management., Educational material distributed., Suggested low cholesterol diet., Encouraged aerobic exercise., Discussed foot care., Reminded to get yearly retinal exam. - Hemoglobin A1c - Urinalysis, Routine w reflex microscopic (not at [facility]) - Microalbumin / creatinine urine ratio - lisinopril (PRINIVIL,ZESTRIL) 40 MG tablet; Take 1 tablet (40 mg total) by mouth daily.  Dispense: 90 tablet; Refill: 3  5. Nonalcoholic hepatosteatosis Weight loss advised, monitor sugars.  - Hepatic function panel  6. Atherosclerosis of abdominal aorta  (HCC) Control blood pressure, cholesterol, glucose, increase exercise.  Advised to stop smoking - Lipid panel - Hemoglobin A1c  7. Hyperlipidemia, unspecified hyperlipidemia type Not willing to get on medication, will check cholesterol and attempt - Lipid panel - DG Chest 2 View; Future  8. Tobacco use disorder Smoking cessation-  instruction/counseling given, counseled patient on the dangers of tobacco use, advised patient to stop smoking, and reviewed strategies to maximize success, patient not ready to quit at this time.  - EKG 12-Lead - [place], RETROPERITNL ABD,  LTD  9. Vitamin D deficiency Continue supplement  10. Medication management - Magnesium  11. Anxiety  12. Poor compliance with medication LONG discussion about compliance, needs to stay on medications  13. Estrogen deficiency With MGM - DG Bone Density; Future  14. COPD exacerbation ([place]) Stop clarithromycin, get on zpak, get CXR, Symbicort sample given - azithromycin (ZITHROMAX) 250 MG tablet; Take 2 tablets (500 mg) on  Day 1,  followed by 1 tablet (250 mg) once daily on Days 2 through 5.  Dispense: 6 each; Refill: 1 - DG Chest 2 View; Future  15. Anemia, unspecified typ - Iron and TIBC  16. Encounter for Medicare annual wellness exam Declines vaccines and colonoscopy, willing to get MGM and DEXA  17. Abdominal pain, epigastric Get on prilosec, check labs - Amylase  Over 40 minutes of exam, counseling, chart review and critical decision making was performed Future Appointments Date Time Provider [place]  05/27/2017 2:00 PM Vicie Mutters, PA-C [id number] None     Plan:   During the course of the visit the patient was educated and counseled about appropriate screening and preventive services including:    Pneumococcal vaccine   Prevnar 13  Influenza vaccine  Td vaccine  Screening electrocardiogram  Bone densitometry screening  Colorectal cancer screening  Diabetes  screening  Glaucoma screening  Nutrition counseling   Advanced directives: requested   Subjective:  Sara Lamb is a 64 y.o. female who presents for Medicare Annual Wellness Visit and 3 month follow up for uncontrolled DM.   She is very noncompliant with test, vaccines, and has an aversion to medication with " reactions" to the majority of medications.   She went to [facility] on Saturday for SOB, she got a breathing treatment, clarithromycin and inhaler. She continue to cough. She is a smoker, has COPD, continue to smoke. She has had swelling in her feet and stomach for 4-5 months.  Wt Readings from Last 3 Encounters:  04/30/16 137 lb 12.8 oz (62.5 kg)  02/12/16 132 lb (59.9 kg)  11/12/15 139 lb (63 kg)    He has had left/right upper quadrant pain x 5-6 months, worse with turning/twisting either way.   Her blood pressure has not been controlled at home, today their BP is BP: (!) 142/64 She does not workout. She denies chest pain, shortness of breath, dizziness.  She is not on cholesterol medication and denies myalgias. Her cholesterol is not at goal. The cholesterol last visit was:   Lab Results  Component Value Date   CHOL 245 (H) 11/12/2015   HDL 41 (L) 11/12/2015   LDLCALC 156 (H) 11/12/2015   TRIG 241 (H) 11/12/2015   CHOLHDL 6.0 (H) 11/12/2015   She has not been working on diet and exercise for Diabetes with diabetic chronic kidney disease, she  she is not on bASA states she can take it due to fatigue, last visit she was taken off all oral medications and switched to insulin but she had 1 episode of a "low" blood sugar of 106 and states she felt shakey, and bad so has not been on anything for 2 weeks. She did try some trajenta samples she found at her house and has been taking that for sugar and tolerating it. She states she has cut out all carbs. , she is on ACE/ARB, and denies polydipsia, polyuria and visual disturbances. Last A1C was:  Lab Results  Component  Value Date   HGBA1C 10.6 (H) 02/12/2016   Last GFR: Lab Results  Component Value Date   GFRNONAA 86 02/12/2016   Patient is on Vitamin D supplement.   Lab Results  Component Value Date   VD25OH 11 (L) 04/30/2015     BMI is Body mass index is 27.83 kg/m., she is working on diet and exercise. Wt Readings from Last 3 Encounters:  04/30/16 137 lb 12.8 oz (62.5 kg)  02/12/16 132 lb (59.9 kg)  11/12/15 139 lb (63 kg)   Younger sister 6, had breast cancer and had double mastectomy. Had negative BRCA testing, but has strong family history.   Medication Review: Current Outpatient Prescriptions on File Prior to Visit  Medication Sig Dispense Refill  . ALPRAZolam (XANAX) 1 MG tablet TAKE ONE TABLET BY MOUTH THREE TIMES DAILY AS NEEDED 90 tablet 2  . meloxicam (MOBIC) 15 MG tablet Take 1 tablet (15 mg total) by mouth daily. 90 tablet 0  . Multiple Vitamins-Minerals (STRESS TAB NF PO) Take by mouth.    . Needles & Syringes MISC Please dispense 2 syringes and needles per day for insulin use BID 180 each 0  . OVER THE COUNTER MEDICATION percogesic 4 tabs daily for pain 325 mg/ 12.5 mg    .  promethazine (PHENERGAN) 25 MG tablet Take 0.5 tablets (12.5 mg total) by mouth every 6 (six) hours as needed for nausea or vomiting. 90 tablet 0  . triamcinolone cream (KENALOG) 0.1 % Apply 1 application topically 3 (three) times daily. 30 g 0   No current facility-administered medications on file prior to visit.     Allergies  Allergen Reactions  . Metformin And Related     ELEVATED BS. GLYBURIDE, ONGLYZA, ACTOS, INVOKANA  . Insulins Rash  . Janumet [Sitagliptin-Metformin Hcl] Rash    Shakes/ rash  . Penicillins Rash    Current Problems (verified) Patient Active Problem List   Diagnosis Date Noted  . Atherosclerosis of abdominal aorta ([place]) 04/30/2016  . 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 COPD (chronic obstructive pulmonary disease) ([place]) 04/30/2015  .  Tobacco use disorder 04/08/2015  . Poor compliance with medication 11/05/2014  . T2_NIDDM, poorly controlled 10/02/2014  . Medication management 12/28/2013  . Nonalcoholic hepatosteatosis   . Vitamin D deficiency   . Anxiety   . Hypertension   . Hyperlipidemia     Screening Tests Immunization History  Administered Date(s) Administered  . Td 06/26/2001  . Tdap 04/30/2015   Preventative care: Last colonoscopy: declines Last mammogram: 2007 willing to do it but will not get chemo/radiation Last pap smear/pelvic exam: declines  DEXA: willing to get with DEXA MRI brain 2016  Prior vaccinations: TD or Tdap:2002 Declines  Influenza: Declines  Pneumococcal: declines Prevnar13: declines Shingles/Zostavax: declines  Names of Other Physician/Practitioners you currently use: 1. [facility] here for primary care 2. none, eye doctor, encouraged to see eye doctor 3. Dentures dentist Patient Care Team: Unk Pinto, MD as PCP - General (Internal Medicine)  SURGICAL HISTORY She  has a past surgical history that includes Abdominal hysterectomy and Dilation and curettage of uterus. FAMILY HISTORY Her family history includes Cancer in her father; Diabetes in her brother, father, and mother; Heart disease in her brother and sister; Hyperlipidemia in her brother, sister, and sister; Hypertension in her brother, father, and sister; Stroke in her mother. SOCIAL HISTORY She  reports that she has been smoking Cigarettes.  She has been smoking about 2.00 packs per day. She does not have any smokeless tobacco history on file. She reports that she does not drink alcohol or use drugs.   MEDICARE WELLNESS OBJECTIVES: Physical activity: Current Exercise Habits: The patient does not participate in regular exercise at present Cardiac risk factors: Cardiac Risk Factors include: advanced age (>58mn, >>62women);diabetes mellitus;dyslipidemia;hypertension;sedentary  lifestyle;smoking/ tobacco exposure;family history of premature cardiovascular disease Depression/mood screen:   Depression screen [facility]/9 04/30/2016  Decreased Interest 0  Down, Depressed, Hopeless 0  PHQ - 2 Score 0    ADLs:  In your present state of health, do you have any difficulty performing the following activities: 04/30/2016  Hearing? N  Vision? N  Difficulty concentrating or making decisions? N  Walking or climbing stairs? N  Dressing or bathing? N  Doing errands, shopping? N  Some recent data might be hidden     Cognitive Testing  Alert? Yes  Normal Appearance?Yes  Oriented to person? Yes  Place? Yes   Time? Yes  Recall of three objects?  Yes  Can perform simple calculations? Yes  Displays appropriate judgment?Yes  Can read the correct time from a watch face?Yes  EOL planning: Does patient have an advance directive?: Yes Copy of advanced directive(s) in chart?: Yes  Review of  Systems  Constitutional: Negative for chills, fever and malaise/fatigue.  HENT: Negative for congestion, ear pain and sore throat.   Eyes: Negative.   Respiratory: Positive for cough and shortness of breath. Negative for wheezing.   Cardiovascular: Positive for leg swelling. Negative for chest pain and palpitations.  Gastrointestinal: Positive for constipation and nausea. Negative for abdominal pain, blood in stool, diarrhea, heartburn, melena and vomiting.  Genitourinary: Negative.   Musculoskeletal: Positive for back pain and myalgias.  Skin: Negative.   Neurological: Negative for dizziness, sensory change, loss of consciousness and headaches.  Psychiatric/Behavioral: Negative for depression. The patient is not nervous/anxious and does not have insomnia.      Objective:     Today's Vitals   04/30/16 1430  BP: (!) 142/64  Pulse: 95  Resp: 18  Temp: 97.2 F (36.2 C)  SpO2: 95%  Weight: 137 lb 12.8 oz (62.5 kg)  Height: 4' 11"  (1.499 m)  PainSc: 5    Body mass index is 27.83  kg/m.  General appearance: alert, no distress, WD/WN, female HEENT: normocephalic, sclerae anicteric, TMs pearly, nares patent, no discharge or erythema, pharynx normal Oral cavity: MMM, no lesions Neck: supple, no lymphadenopathy, no thyromegaly, no masses Heart: RRR, normal S1, S2, no murmurs Lungs: decreased BS, with diffuse wheezing and rhonchi bilateral, no rales Abdomen: +bs, soft, obese, tender epigastric, non distended, no masses, no hepatomegaly, no splenomegaly Musculoskeletal: nontender, no swelling, no obvious deformity Extremities: no edema, no cyanosis, no clubbing Pulses: 2+ symmetric, upper and lower extremities, normal cap refill Neurological: alert, oriented x 3, CN2-12 intact, strength normal upper extremities and lower extremities, sensation normal throughout, DTRs 2+ throughout, no cerebellar signs, gait antalgic Skin: bilateral shoulders with 3x5cm ulcerations.  Psychiatric: normal affect, behavior normal, pleasant   Medicare Attestation I have personally reviewed: The patient's medical and social history Their use of alcohol, tobacco or illicit drugs Their current medications and supplements The patient's functional ability including ADLs,fall risks, home safety risks, cognitive, and hearing and visual impairment Diet and physical activities Evidence for depression or mood disorders  The patient's weight, height, BMI, and visual acuity have been recorded in the chart.  I have made referrals, counseling, and provided education to the patient based on review of the above and I have provided the patient with a written personalized care plan for preventive services.     Vicie Mutters, PA-C   04/30/2016

## 2016-04-30 NOTE — Patient Instructions (Addendum)
[facility]  7 a.m.-6:30 p.m., Monday 7 a.m.-5 p.m., Tuesday-Friday Schedule an appointment by calling [phone].  Encourage you to get the 3D Mammogram  The 3D Mammogram is much more specific and sensitive to pick up breast cancer. For women with fibrocystic breast or lumpy breast it can be hard to determine if it is cancer or not but the 3D mammogram is able to tell this difference which cuts back on unneeded additional tests or scary call backs.   - over 40% increase in detection of breast cancer - over 40% reduction in false positives.  - fewer call backs - reduced anxiety - improved outcomes - [facility]   Can use the miralax for constipation, if this does not work may try the lactulose Try the samples for you stomach and side pain  Try the tradjenta samples and healthy eating, if this does not work for A1C in 3 months we will go back to insulin but go very slowly so you don't have as many symptoms      Bad carbs also include fruit juice, alcohol, and sweet tea. These are empty calories that do not signal to your brain that you are full.   Please remember the good carbs are still carbs which convert into sugar. So please measure them out no more than 1/2-1 cup of rice, oatmeal, pasta, and beans  Veggies are however free foods! Pile them on.   Not all fruit is created equal. Please see the list below, the fruit at the bottom is higher in sugars than the fruit at the top. Please avoid all dried fruits.     Chronic Obstructive Pulmonary Disease Chronic obstructive pulmonary disease (COPD) is a common lung condition in which airflow from the lungs is limited. COPD is a general term that can be used to describe many different lung problems that limit airflow, including both chronic bronchitis and emphysema. If you have COPD, your lung function will probably never return to normal, but there are measures you can take to improve lung function and  make yourself feel better. CAUSES   Smoking (common).  Exposure to secondhand smoke.  Genetic problems.  Chronic inflammatory lung diseases or recurrent infections. SYMPTOMS  Shortness of breath, especially with physical activity.  Deep, persistent (chronic) cough with a large amount of thick mucus.  Wheezing.  Rapid breaths (tachypnea).  Gray or bluish discoloration (cyanosis) of the skin, especially in your fingers, toes, or lips.  Fatigue.  Weight loss.  Frequent infections or episodes when breathing symptoms become much worse (exacerbations).  Chest tightness. DIAGNOSIS Your health care provider will take a medical history and perform a physical examination to diagnose COPD. Additional tests for COPD may include:  Lung (pulmonary) function tests.  Chest X-ray.  CT scan.  Blood tests. TREATMENT  Treatment for COPD may include:  Inhaler and nebulizer medicines. These help manage the symptoms of COPD and make your breathing more comfortable.  Supplemental oxygen. Supplemental oxygen is only helpful if you have a low oxygen level in your blood.  Exercise and physical activity. These are beneficial for nearly all people with COPD.  Lung surgery or transplant.  Nutrition therapy to gain weight, if you are underweight.  Pulmonary rehabilitation. This may involve working with a team of health care providers and specialists, such as respiratory, occupational, and physical therapists. HOME CARE INSTRUCTIONS  Take all medicines (inhaled or pills) as directed by your health care provider.  Avoid over-the-counter medicines  or cough syrups that dry up your airway (such as antihistamines) and slow down the elimination of secretions unless instructed otherwise by your health care provider.  If you are a smoker, the most important thing that you can do is stop smoking. Continuing to smoke will cause further lung damage and breathing trouble. Ask your health care provider  for help with quitting smoking. He or she can direct you to community resources or hospitals that provide support.  Avoid exposure to irritants such as smoke, chemicals, and fumes that aggravate your breathing.  Use oxygen therapy and pulmonary rehabilitation if directed by your health care provider. If you require home oxygen therapy, ask your health care provider whether you should purchase a pulse oximeter to measure your oxygen level at home.  Avoid contact with individuals who have a contagious illness.  Avoid extreme temperature and humidity changes.  Eat healthy foods. Eating smaller, more frequent meals and resting before meals may help you maintain your strength.  Stay active, but balance activity with periods of rest. Exercise and physical activity will help you maintain your ability to do things you want to do.  Preventing infection and hospitalization is very important when you have COPD. Make sure to receive all the vaccines your health care provider recommends, especially the pneumococcal and influenza vaccines. Ask your health care provider whether you need a pneumonia vaccine.  Learn and use relaxation techniques to manage stress.  Learn and use controlled breathing techniques as directed by your health care provider. Controlled breathing techniques include:  Pursed lip breathing. Start by breathing in (inhaling) through your nose for 1 second. Then, purse your lips as if you were going to whistle and breathe out (exhale) through the pursed lips for 2 seconds.  Diaphragmatic breathing. Start by putting one hand on your abdomen just above your waist. Inhale slowly through your nose. The hand on your abdomen should move out. Then purse your lips and exhale slowly. You should be able to feel the hand on your abdomen moving in as you exhale.  Learn and use controlled coughing to clear mucus from your lungs. Controlled coughing is a series of short, progressive coughs. The steps of  controlled coughing are: 1. Lean your head slightly forward. 2. Breathe in deeply using diaphragmatic breathing. 3. Try to hold your breath for 3 seconds. 4. Keep your mouth slightly open while coughing twice. 5. Spit any mucus out into a tissue. 6. Rest and repeat the steps once or twice as needed. SEEK MEDICAL CARE IF:  You are coughing up more mucus than usual.  There is a change in the color or thickness of your mucus.  Your breathing is more labored than usual.  Your breathing is faster than usual. SEEK IMMEDIATE MEDICAL CARE IF:  You have shortness of breath while you are resting.  You have shortness of breath that prevents you from:  Being able to talk.  Performing your usual physical activities.  You have chest pain lasting longer than 5 minutes.  Your skin color is more cyanotic than usual.  You measure low oxygen saturations for longer than 5 minutes with a pulse oximeter. MAKE SURE YOU:  Understand these instructions.  Will watch your condition.  Will get help right away if you are not doing well or get worse.   This information is not intended to replace advice given to you by your health care provider. Make sure you discuss any questions you have with your health care provider.  Document Released: 04/22/2005 Document Revised: 08/03/2014 Document Reviewed: 03/09/2013 Elsevier Interactive Patient Education [employer].

## 2016-05-01 ENCOUNTER — Other Ambulatory Visit: Payer: Self-pay | Admitting: Physician Assistant

## 2016-05-01 DIAGNOSIS — Z1231 Encounter for screening mammogram for malignant neoplasm of breast: Secondary | ICD-10-CM

## 2016-05-01 LAB — BASIC METABOLIC PANEL WITH GFR
BUN: 12 mg/dL (ref 7–25)
CHLORIDE: 97 mmol/L — AB (ref 98–110)
CO2: 23 mmol/L (ref 20–31)
Calcium: 9 mg/dL (ref 8.6–10.4)
Creat: 0.85 mg/dL (ref 0.50–0.99)
GFR, Est African American: 83 mL/min (ref >=60)
GFR, Est Non African American: 72 mL/min (ref >=60)
Glucose, Bld: 234 mg/dL — ABNORMAL HIGH (ref 65–99)
POTASSIUM: 4 mmol/L (ref 3.5–5.3)
SODIUM: 134 mmol/L — AB (ref 135–146)

## 2016-05-01 LAB — URINALYSIS, ROUTINE W REFLEX MICROSCOPIC
BILIRUBIN URINE: NEGATIVE
KETONES UR: NEGATIVE
Nitrite: NEGATIVE
PH: 5 (ref 5.0–8.0)
SPECIFIC GRAVITY, URINE: 1.012 (ref 1.001–1.035)

## 2016-05-01 LAB — IRON AND TIBC
%SAT: 16 % (ref 11–50)
IRON: 54 ug/dL (ref 45–160)
TIBC: 348 ug/dL (ref 250–450)
UIBC: 294 ug/dL (ref 125–400)

## 2016-05-01 LAB — HEPATIC FUNCTION PANEL
ALK PHOS: 108 U/L (ref 33–130)
ALT: 15 U/L (ref 6–29)
AST: 16 U/L (ref 10–35)
Albumin: 3.7 g/dL (ref 3.6–5.1)
BILIRUBIN DIRECT: 0 mg/dL (ref <=0.2)
BILIRUBIN INDIRECT: 0.3 mg/dL (ref 0.2–1.2)
Total Bilirubin: 0.3 mg/dL (ref 0.2–1.2)
Total Protein: 6.8 g/dL (ref 6.1–8.1)

## 2016-05-01 LAB — AMYLASE: AMYLASE: 51 U/L (ref 0–105)

## 2016-05-01 LAB — URINALYSIS, MICROSCOPIC ONLY
CASTS: NONE SEEN [LPF]
CRYSTALS: NONE SEEN [HPF]
RBC / HPF: NONE SEEN RBC/HPF (ref <=2)
SQUAMOUS EPITHELIAL / LPF: NONE SEEN [HPF] (ref <=5)
Yeast: NONE SEEN [HPF]

## 2016-05-01 LAB — LIPID PANEL
CHOL/HDL RATIO: 6.2 ratio — AB (ref <=5.0)
Cholesterol: 237 mg/dL — ABNORMAL HIGH (ref 125–200)
HDL: 38 mg/dL — AB (ref >=46)
LDL CALC: 140 mg/dL — AB (ref <130)
Triglycerides: 296 mg/dL — ABNORMAL HIGH (ref <150)
VLDL: 59 mg/dL — AB (ref <30)

## 2016-05-01 LAB — MAGNESIUM: Magnesium: 1.9 mg/dL (ref 1.5–2.5)

## 2016-05-01 LAB — MICROALBUMIN / CREATININE URINE RATIO
Creatinine, Urine: 38 mg/dL (ref 20–320)
Microalb Creat Ratio: 671 mcg/mg creat — ABNORMAL HIGH (ref <30)
Microalb, Ur: 25.5 mg/dL

## 2016-05-01 LAB — HEMOGLOBIN A1C
HEMOGLOBIN A1C: 10.3 % — AB (ref <5.7)
Mean Plasma Glucose: 249 mg/dL

## 2016-05-03 ENCOUNTER — Other Ambulatory Visit: Payer: Self-pay | Admitting: Physician Assistant

## 2016-05-03 LAB — URINE CULTURE

## 2016-05-03 MED ORDER — CEFUROXIME AXETIL 250 MG PO TABS: 250.0000 mg | ORAL_TABLET | Freq: Two times a day (BID) | ORAL | 0 refills | Status: DC

## 2016-05-07 ENCOUNTER — Telehealth: Payer: Self-pay | Admitting: Physician Assistant

## 2016-05-07 NOTE — Telephone Encounter (Signed)
Patient called stating that her sugar was 521 off the insulin, she took 10 units and it was 239 this AM and she is feeling better. She called to get back on the glimepiride because she said the insulin caused a low sugar of 120 and she felt bad.   I have explained that at this time she needs to do the insulin, we will titrate her much more slowly and tolerate higher sugars in the process. She denies changes in vision/speech, no CP, SOB, weakness. Will follow up 1 month for DM teaching.   She will start 10 units 70/30 in the morning  and 6 units at night.   If her morning sugar is above 180 she will increase her night time insulin by 2 units every 2 days.  If her sugar before dinner is above 200 she will increase her morning sugar by 2 units every 2 days.   If she feels bad at anytime she will maintain the same dose and retry in 2-4 days.  She will at least eat 2 times a day and will eat with the insulin. Follow up 1 month.

## 2016-05-12 DIAGNOSIS — J209 Acute bronchitis, unspecified: Secondary | ICD-10-CM | POA: Diagnosis not present

## 2016-05-12 DIAGNOSIS — R05 Cough: Secondary | ICD-10-CM | POA: Diagnosis not present

## 2016-05-12 DIAGNOSIS — J449 Chronic obstructive pulmonary disease, unspecified: Secondary | ICD-10-CM | POA: Diagnosis not present

## 2016-05-13 ENCOUNTER — Ambulatory Visit (INDEPENDENT_AMBULATORY_CARE_PROVIDER_SITE_OTHER): Payer: PPO | Admitting: Physician Assistant

## 2016-05-13 ENCOUNTER — Ambulatory Visit (HOSPITAL_COMMUNITY)
Admission: RE | Admit: 2016-05-13 | Discharge: 2016-05-13 | Disposition: A | Discharge status: Home / Self Care | Payer: PPO | Source: Ambulatory Visit | Attending: Physician Assistant | Admitting: Physician Assistant

## 2016-05-13 ENCOUNTER — Encounter: Payer: Self-pay | Admitting: Physician Assistant

## 2016-05-13 VITALS — BP 130/80 | HR 83 | Temp 97.3°F | Resp 14 | Ht 59.0 in | Wt 140.0 lb

## 2016-05-13 DIAGNOSIS — R05 Cough: Secondary | ICD-10-CM | POA: Diagnosis not present

## 2016-05-13 DIAGNOSIS — E1165 Type 2 diabetes mellitus with hyperglycemia: Secondary | ICD-10-CM

## 2016-05-13 DIAGNOSIS — Z9114 Patient's other noncompliance with medication regimen: Secondary | ICD-10-CM | POA: Diagnosis not present

## 2016-05-13 DIAGNOSIS — E785 Hyperlipidemia, unspecified: Secondary | ICD-10-CM

## 2016-05-13 DIAGNOSIS — J449 Chronic obstructive pulmonary disease, unspecified: Secondary | ICD-10-CM

## 2016-05-13 DIAGNOSIS — E1151 Type 2 diabetes mellitus with diabetic peripheral angiopathy without gangrene: Secondary | ICD-10-CM | POA: Diagnosis not present

## 2016-05-13 DIAGNOSIS — J441 Chronic obstructive pulmonary disease with (acute) exacerbation: Secondary | ICD-10-CM

## 2016-05-13 DIAGNOSIS — I700 Atherosclerosis of aorta: Secondary | ICD-10-CM

## 2016-05-13 DIAGNOSIS — I10 Essential (primary) hypertension: Secondary | ICD-10-CM

## 2016-05-13 MED ORDER — LEVOFLOXACIN 500 MG PO TABS: 500.0000 mg | ORAL_TABLET | Freq: Every day | ORAL | 0 refills | Status: DC

## 2016-05-13 MED ORDER — PROMETHAZINE-CODEINE 6.25-10 MG/5ML PO SYRP: 5.0000 mL | ORAL_SOLUTION | Freq: Four times a day (QID) | ORAL | 0 refills | Status: DC | PRN

## 2016-05-13 MED ORDER — ALBUTEROL SULFATE (2.5 MG/3ML) 0.083% IN NEBU: 2.5000 mg | INHALATION_SOLUTION | Freq: Four times a day (QID) | RESPIRATORY_TRACT | 12 refills | Status: DC | PRN

## 2016-05-13 MED ORDER — CEFTRIAXONE SODIUM 500 MG IJ SOLR: 500.0000 mg | Freq: Once | INTRAMUSCULAR | Status: DC

## 2016-05-13 MED ORDER — IPRATROPIUM-ALBUTEROL 0.5-2.5 (3) MG/3ML IN SOLN: 3.0000 mL | Freq: Once | RESPIRATORY_TRACT | Status: DC

## 2016-05-13 NOTE — Progress Notes (Signed)
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  Poor compliance with medication   T2_NIDDM, poorly controlled -     Ambulatory referral to Endocrinology  COPD exacerbation ([place]) -     levofloxacin (LEVAQUIN) 500 MG tablet; Take 1 tablet (500 mg total) by mouth daily. -     ipratropium-albuterol (DUONEB) 0.5-2.5 (3) MG/3ML nebulizer solution 3 mL; Take 3 mLs by nebulization once. -     cefTRIAXone (ROCEPHIN) injection 500 mg; Inject 500 mg into the muscle once. -     albuterol (PROVENTIL) (2.5 MG/3ML) 0.083% nebulizer solution; Take 3 mLs (2.5 mg total) by nebulization every 6 (six) hours as needed for wheezing or shortness of breath.  The patient was advised to call immediately if she has any concerning symptoms in the interval. The patient voices understanding of current treatment options and is in agreement with the current care plan.The patient knows to call the clinic with any problems, questions or concerns or go to the ER if any further progression of symptoms.    HPI 65 y.o. smoking WF with history of noncompliance, uncontrolled DM, COPD presents for cough and for uncontrolled DM.   Last visit she was also treated for COPD exacerbation with zpak, not given prednisone due to fear of DM coma/hospitilazation, had CXR on her way over. She has been doing symbicort BID. She continue to cough, have SOB, chest feels very tight with bilateral flank pain with cough, fatigue, no chest pain, no fever or chills.   She has failed several DM medication due to alleged allergy, however at this time I believe the patient has such a high baseline sugar that when it is lowered too  quickly that she feels hypoglycemia symptoms. So last visit we started her on a lower dose of the insulin with a slower taper. At this time I do believe she would benefit from an endocrinologist in addition to [place] here.  Insulin started:  She will start 10 units 70/30 in the morning  and 6 units at night.  If her morning sugar is above 180 she will increase her night time insulin by 2 units every 2 days.  If her sugar before dinner is above 200 she will increase her morning sugar by 2 units every 2 days.  If she feels bad at anytime she will maintain the same dose and retry in 2-4 days.  She will at least eat 2 times a day and will eat with the insulin.     Past Medical History:  Diagnosis Date  . Anxiety   . Atherosclerosis of aorta ([place])    via CXR 2013  . Diabetes mellitus without complication ([place])   . Hyperlipidemia   . Hypertension   . Nonalcoholic hepatosteatosis   . Unspecified vitamin D deficiency      Allergies  Allergen Reactions  . Metformin And Related     ELEVATED BS. GLYBURIDE, ONGLYZA, ACTOS, INVOKANA  . Insulins Rash  . Janumet [Sitagliptin-Metformin Hcl] Rash    Shakes/ rash  . Penicillins Rash      Current Outpatient Prescriptions on File Prior to Visit  Medication Sig Dispense Refill  . ALPRAZolam (XANAX) 1 MG tablet  TAKE ONE TABLET BY MOUTH THREE TIMES DAILY AS NEEDED 90 tablet 2  . cefUROXime (CEFTIN) 250 MG tablet Take 1 tablet (250 mg total) by mouth 2 (two) times daily. 20 tablet 0  . linagliptin (TRADJENTA) 5 MG TABS tablet Take 1 tablet (5 mg total) by mouth daily. 30 tablet 3  . lisinopril (PRINIVIL,ZESTRIL) 40 MG tablet Take 1 tablet (40 mg total) by mouth daily. 90 tablet 3  . meloxicam (MOBIC) 15 MG tablet Take 1 tablet (15 mg total) by mouth daily. 90 tablet 0  . Multiple Vitamins-Minerals (STRESS TAB NF PO) Take by mouth.    . Needles & Syringes MISC Please dispense 2 syringes and needles per day for insulin use BID 180 each 0  . OVER THE  COUNTER MEDICATION percogesic 4 tabs daily for pain 325 mg/ 12.5 mg    . promethazine (PHENERGAN) 25 MG tablet Take 0.5 tablets (12.5 mg total) by mouth every 6 (six) hours as needed for nausea or vomiting. 90 tablet 0  . triamcinolone cream (KENALOG) 0.1 % Apply 1 application topically 3 (three) times daily. 30 g 0   No current facility-administered medications on file prior to visit.     ROS: all negative except above.   Physical Exam: Filed Weights   05/13/16 1340  Weight: 140 lb (63.5 kg)   BP 130/80   Pulse 83   Temp 97.3 F (36.3 C)   Resp 14   Ht 4' 11"  (1.499 m)   Wt 140 lb (63.5 kg)   SpO2 98%   BMI 28.28 kg/m  General Appearance: Well nourished, in no apparent distress. Eyes: PERRLA, EOMs, conjunctiva no swelling or erythema Sinuses: No Frontal/maxillary tenderness ENT/Mouth: Ext aud canals clear, TMs without erythema, bulging. No erythema, swelling, or exudate on post pharynx.  Tonsils not swollen or erythematous. Hearing normal.  Neck: Supple, thyroid normal.  Respiratory: Respiratory effort with diffuse decreased breath sounds, improved after duoneb, right lower lobe rhonchi and decreased LLL.  Cardio: RRR with no MRGs. Brisk peripheral pulses without edema.  Abdomen: Soft, + BS.  Non tender, no guarding, rebound, hernias, masses. Lymphatics: Non tender without lymphadenopathy.  Musculoskeletal: Full ROM, 5/5 strength, normal gait.  Skin: Warm, dry without rashes, lesions, ecchymosis.  Neuro: Cranial nerves intact. Normal muscle tone, no cerebellar symptoms.  Psych: Awake and oriented X 3, normal affect, Insight and Judgment appropriate.     Vicie Mutters, PA-C 2:36 PM [facility]

## 2016-05-13 NOTE — Patient Instructions (Addendum)
We are referring you to an endocrinologist for help with your sugars  Continue the 10 units in the AM and 6 units at night Increase by 2 units every 2 days in the Morning if sugar is over 200  increase by 2 units at night every 2 days at night if your sugar is above 180  Your A1C is a measure of your sugar over the past 3 months and is not affected by what you have eaten over the past few days. Diabetes increases your chances of stroke and heart attack over 300 % and is the leading cause of blindness and kidney failure in the [place]. Please make sure you decrease bad carbs like white bread, white rice, potatoes, corn, soft drinks, pasta, cereals, refined sugars, sweet tea, dried fruits, and fruit juice. Good carbs are okay to eat in moderation like sweet potatoes, brown rice, whole grain pasta/bread, most fruit (except dried fruit) and you can eat as many veggies as you want.   Greater than 6.5 is considered diabetic. Between 6.4 and 5.7 is prediabetic If your A1C is less than 5.7 you are NOT diabetic.  Targets for Glucose Readings: Time of Check Target for patients WITHOUT Diabetes Target for DIABETICS  Before Meals Less than 100  less than 150  Two hours after meals Less than 200  Less than 250   Your health care provider has decided you need to take insulin regularly. You have been given a correction scale (also called a sliding scale) in case you need extra insulin when your blood sugar is too high (hyperglycemia). The following instructions will assist you in how to use that correction scale.  WHAT IS A CORRECTION SCALE?  When you check your blood sugar, sometimes it will be higher than your health care provider has told you it should be. You may need an extra dose of insulin to bring your blood sugar to the recommended level (also known as your goal, target, or normal level). The correction scale is prescribed by your health care provider based on your specific needs.  Your  correction scale has two parts:   The first shows you a blood sugar range.   The second part tells you how much extra insulin to give yourself if your blood sugar falls within this range. You will not need an extra dose of insulin if your blood glucose is in the desired range. You should simply give yourself the normal amount of insulin that your health care provider has ordered for you.  WHY IS IT IMPORTANT TO KEEP YOUR BLOOD SUGAR LEVELS AT YOUR DESIRED LEVEL?  Keeping your blood sugar at the desired level helps to prevent long-term complications of diabetes, such as eye disease, kidney failure, nerve damage, and other serious complications. WHAT TYPE OF INSULIN WILL YOU USE?  To help bring down blood sugar levels that are too high, your health care provider will prescribe a short-acting or a rapid-acting insulin. An example of a short-acting insulin would be regular insulin. Remember, you may also have a longer-acting insulin prescribed for you.  WHAT DO YOU NEED TO DO?   Check your blood sugar with your home blood glucose meter as recommended by your health care provider.   Using your correction scale, find the range that your blood sugar lies in.   Look for the units of insulin that match that blood sugar range. Give yourself the dose of correction insulin your health care provider has prescribed. Always make sure you  are using the right type of insulin.   Prior to the injection, make sure you have food available that you can eat in the next 15-30 minutes.   If your correction insulin is short acting(regular), start eating your meal within 30 minutes after you have given yourself the insulin injection. If you wait longer than 30 minutes to eat, your blood sugar might get too low. Symptoms of low blood sugar (hypoglycemia) may include feeling shaky or weak, sweating, feeling confused, difficulty seeing, agitation, crankiness, or numbness of the lips or tongue. Check your blood  sugar immediately and treat your results as directed by your health care provider.   Keep a log of your blood sugar results with the time you took the test and the amount of insulin that you injected. This information will help your health care provider manage your medicines.   Note on your log anything that may affect your blood sugar level, such as:   Changes in normal exercise or activity.   Changes in your normal schedule, such as staying up late, going on vacation, changing your diet, or holidays.   New medicines. This includes prescription and over-the-counter medicines. Some medicines may cause high blood sugar.   Sickness, stress, or anxiety.   Changes in the time you took your medicine.   Changes in your meals, such as skipping a meal, having a late meal, or dining out.   Eating things that may affect blood glucose, such as snacks, meal portions that are larger than normal, drinks with sugar, or eating less than usual.   Ask your health care provider any questions you have.  Be aware of "stacking" your insulin doses. This happens when you correct a high blood sugar level by giving yourself extra insulin too soon after a previous correction dose or mealtime dose. You may then have too much insulin still active in your body and may be at risk for hypoglycemia. WHY DO YOU NEED A CORRECTION SCALE IF YOU HAVE NEVER BEEN DIAGNOSED WITH DIABETES?   Keeping your blood glucose in the target range is important for your overall health.   You may have been prescribed medicines that cause your blood glucose to be higher than normal. WHEN SHOULD YOU SEEK MEDICAL CARE? Contact your health care provider if:   You have experienced hypoglycemia that you are unable to treat with your usual routine.   You have a high blood sugar level that is not coming down with the correction dose.  Your blood sugar is often too low or does not come up even if you eat a fast-acting  carbohydrate. Someone who lives with you should seek immediate medical care if you become unresponsive.   This information is not intended to replace advice given to you by your health care provider. Make sure you discuss any questions you have with your health care provider.   Document Released: 12/04/2010 Document Revised: 03/15/2013 Document Reviewed: 12/23/2012 Elsevier Interactive Patient Education [employer].

## 2016-05-15 ENCOUNTER — Ambulatory Visit
Admission: RE | Admit: 2016-05-15 | Discharge: 2016-05-15 | Disposition: A | Discharge status: Home / Self Care | Payer: PPO | Source: Ambulatory Visit | Attending: Physician Assistant | Admitting: Physician Assistant

## 2016-05-15 DIAGNOSIS — E2839 Other primary ovarian failure: Secondary | ICD-10-CM

## 2016-05-15 DIAGNOSIS — M810 Age-related osteoporosis without current pathological fracture: Secondary | ICD-10-CM | POA: Diagnosis not present

## 2016-05-15 DIAGNOSIS — Z780 Asymptomatic menopausal state: Secondary | ICD-10-CM | POA: Diagnosis not present

## 2016-05-15 DIAGNOSIS — Z1231 Encounter for screening mammogram for malignant neoplasm of breast: Secondary | ICD-10-CM

## 2016-05-19 ENCOUNTER — Other Ambulatory Visit: Payer: Self-pay | Admitting: Physician Assistant

## 2016-05-19 ENCOUNTER — Other Ambulatory Visit: Payer: Self-pay

## 2016-05-19 DIAGNOSIS — R928 Other abnormal and inconclusive findings on diagnostic imaging of breast: Secondary | ICD-10-CM

## 2016-05-25 ENCOUNTER — Ambulatory Visit
Admission: RE | Admit: 2016-05-25 | Discharge: 2016-05-25 | Disposition: A | Discharge status: Home / Self Care | Payer: PPO | Source: Ambulatory Visit | Attending: *Deleted | Admitting: *Deleted

## 2016-05-25 ENCOUNTER — Other Ambulatory Visit: Payer: Self-pay | Admitting: Physician Assistant

## 2016-05-25 ENCOUNTER — Ambulatory Visit
Admission: RE | Admit: 2016-05-25 | Discharge: 2016-05-25 | Disposition: A | Discharge status: Home / Self Care | Payer: PPO | Source: Ambulatory Visit | Attending: Physician Assistant | Admitting: Physician Assistant

## 2016-05-25 DIAGNOSIS — R599 Enlarged lymph nodes, unspecified: Secondary | ICD-10-CM

## 2016-05-25 DIAGNOSIS — R921 Mammographic calcification found on diagnostic imaging of breast: Secondary | ICD-10-CM | POA: Diagnosis not present

## 2016-05-25 DIAGNOSIS — N6489 Other specified disorders of breast: Secondary | ICD-10-CM | POA: Diagnosis not present

## 2016-05-25 DIAGNOSIS — R928 Other abnormal and inconclusive findings on diagnostic imaging of breast: Secondary | ICD-10-CM

## 2016-05-27 ENCOUNTER — Encounter: Payer: Self-pay | Admitting: Physician Assistant

## 2016-05-27 ENCOUNTER — Ambulatory Visit (INDEPENDENT_AMBULATORY_CARE_PROVIDER_SITE_OTHER): Payer: PPO | Admitting: Physician Assistant

## 2016-05-27 VITALS — BP 132/76 | HR 91 | Temp 97.5°F | Resp 16 | Ht 59.0 in | Wt 140.4 lb

## 2016-05-27 DIAGNOSIS — E1165 Type 2 diabetes mellitus with hyperglycemia: Secondary | ICD-10-CM | POA: Diagnosis not present

## 2016-05-27 DIAGNOSIS — N3000 Acute cystitis without hematuria: Secondary | ICD-10-CM

## 2016-05-27 DIAGNOSIS — J441 Chronic obstructive pulmonary disease with (acute) exacerbation: Secondary | ICD-10-CM | POA: Diagnosis not present

## 2016-05-27 DIAGNOSIS — M810 Age-related osteoporosis without current pathological fracture: Secondary | ICD-10-CM | POA: Diagnosis not present

## 2016-05-27 MED ORDER — VARENICLINE TARTRATE 1 MG PO TABS: 1.0000 mg | ORAL_TABLET | Freq: Two times a day (BID) | ORAL | 2 refills | Status: DC

## 2016-05-27 NOTE — Patient Instructions (Addendum)
If you have a smart phone, please look up Smoke Free app, this will help you stay on track and give you information about money you have saved, life that you have gained back and a ton of more information.   We are giving you chantix for smoking cessation. You can do it! And we are here to help! You may have heard some scary side effects about chantix, the three most common I hear about are nausea, crazy dreams and depression.  However, I like for my patients to try to stay on 1/2 a tablet twice a day rather than one tablet twice a day as normally prescribed. This helps decrease the chances of side effects and helps save money by making a one month prescription last two months  Please start the prescription this way:  Start 1/2 tablet by mouth once daily after food with a full glass of water for 3 days Then do 1/2 tablet by mouth twice daily for 4 days. During this first week you can smoke, but try to stop after this week.  At this point we have several options: 1) continue on 1/2 tablet twice a day- which I encourage you to do. You can stay on this dose the rest of the time on the medication or if you still feel the need to smoke you can do one of the two options below. 2) do one tablet in the morning and 1/2 in the evening which helps decrease dreams. 3) do one tablet twice a day.   What if I miss a dose? If you miss a dose, take it as soon as you can. If it is almost time for your next dose, take only that dose. Do not take double or extra doses.  What should I watch for while using this medicine? Visit your doctor or health care professional for regular check ups. Ask for ongoing advice and encouragement from your doctor or healthcare professional, friends, and family to help you quit. If you smoke while on this medication, quit again  Your mouth may get dry. Chewing sugarless gum or hard candy, and drinking plenty of water may help. Contact your doctor if the problem does not go away or is  severe.  You may get drowsy or dizzy. Do not drive, use machinery, or do anything that needs mental alertness until you know how this medicine affects you. Do not stand or sit up quickly, especially if you are an older patient.   The use of this medicine may increase the chance of suicidal thoughts or actions. Pay special attention to how you are responding while on this medicine. Any worsening of mood, or thoughts of suicide or dying should be reported to your health care professional right away.  ADVANTAGES OF QUITTING SMOKING  Within 20 minutes, blood pressure decreases. Your pulse is at normal level.  After 8 hours, carbon monoxide levels in the blood return to normal. Your oxygen level increases.  After 24 hours, the chance of having a heart attack starts to decrease. Your breath, hair, and body stop smelling like smoke.  After 48 hours, damaged nerve endings begin to recover. Your sense of taste and smell improve.  After 72 hours, the body is virtually free of nicotine. Your bronchial tubes relax and breathing becomes easier.  After 2 to 12 weeks, lungs can hold more air. Exercise becomes easier and circulation improves.  After 1 year, the risk of coronary heart disease is cut in half.  After 5 years,  the risk of stroke falls to the same as a nonsmoker.  After 10 years, the risk of lung cancer is cut in half and the risk of other cancers decreases significantly.  After 15 years, the risk of coronary heart disease drops, usually to the level of a nonsmoker.  You will have extra money to spend on things other than cigarettes.   Osteoporosis (not osteoarthritis) Osteoporosis happens when your bones become thinner and weaker. Weak bones can break (fracture) more easily when you slip or fall. Bones most at risk of breaking are in the hip, wrist, and spine. HOME CARE  Get enough calcium and vitamin D. These nutrients are good for your bones.  Exercise as told by your  doctor.  Do not use any tobacco products. This includes cigarettes, chewing tobacco, and electronic cigarettes. If you need help quitting, ask your doctor.  Limit the amount of alcohol you drink.  Take medicines only as told by your doctor.  Keep all follow-up visits as told by your doctor. This is important.  Take care at home to prevent falls. Some ways to do this are:  Keep rooms well lit and tidy.  Put safety rails on your stairs.  Put a rubber mat in the bathroom and other places that are often wet or slippery. GET HELP RIGHT AWAY IF:  You fall.  You hurt yourself.   This information is not intended to replace advice given to you by your health care provider. Make sure you discuss any questions you have with your health care provider.   Document Released: 10/05/2011 Document Revised: 08/03/2014 Document Reviewed: 12/21/2013 Elsevier Interactive Patient Education [employer].

## 2016-05-27 NOTE — Progress Notes (Signed)
Assessment and Plan: 1. COPD exacerbation ([place]) Better, sent in chantix for her to try to quit smoking after BX  2. T2_NIDDM, poorly controlled Continue insulin, follow up endocrine 1 month  3. Acute cystitis without hematuria No symptoms, recheck urine - Urinalysis, Routine w reflex microscopic (not at [facility]) - Urine culture  4. Osteoporosis, unspecified osteoporosis type, unspecified pathological fracture presence Declines meds, continue vitamin D/calcium and stop smoking, chantix sent in    HPI 65 y.o.female presents for follow up for sugars and UTI. Had UTI last OV, given ceftin and is here for follow up. During this time patient also has had breast MGM with right numerous calcifications spanning 7cm x 4cm x 4cm area, discussed, will get biopsy tomorrow.  She has osteoporosis, she declines treatment at this time but is on calcium and vitamin D, she would like to stop smoking. Sugars have been doing better, are 189-150 in the AM without low sugars, and she has appointment with endocrine dec 4th.   Past Medical History:  Diagnosis Date  . Anxiety   . Atherosclerosis of aorta ([place])    via CXR 2013  . Diabetes mellitus without complication ([place])   . Hyperlipidemia   . Hypertension   . Nonalcoholic hepatosteatosis   . Unspecified vitamin D deficiency      Allergies  Allergen Reactions  . Metformin And Related     ELEVATED BS. GLYBURIDE, ONGLYZA, ACTOS, INVOKANA  . Insulins Rash  . Janumet [Sitagliptin-Metformin Hcl] Rash    Shakes/ rash  . Penicillins Rash      Current Outpatient Prescriptions on File Prior to Visit  Medication Sig Dispense Refill  . albuterol (PROVENTIL) (2.5 MG/3ML) 0.083% nebulizer solution Take 3 mLs (2.5 mg total) by nebulization every 6 (six) hours as needed for wheezing or shortness of breath. 75 mL 12  . ALPRAZolam (XANAX) 1 MG tablet TAKE ONE TABLET BY MOUTH THREE TIMES DAILY AS NEEDED 90 tablet 2  . levofloxacin (LEVAQUIN) 500 MG tablet Take 1  tablet (500 mg total) by mouth daily. 10 tablet 0  . linagliptin (TRADJENTA) 5 MG TABS tablet Take 1 tablet (5 mg total) by mouth daily. 30 tablet 3  . lisinopril (PRINIVIL,ZESTRIL) 40 MG tablet Take 1 tablet (40 mg total) by mouth daily. 90 tablet 3  . meloxicam (MOBIC) 15 MG tablet Take 1 tablet (15 mg total) by mouth daily. 90 tablet 0  . Multiple Vitamins-Minerals (STRESS TAB NF PO) Take by mouth.    . Needles & Syringes MISC Please dispense 2 syringes and needles per day for insulin use BID 180 each 0  . OVER THE COUNTER MEDICATION percogesic 4 tabs daily for pain 325 mg/ 12.5 mg    . promethazine (PHENERGAN) 25 MG tablet Take 0.5 tablets (12.5 mg total) by mouth every 6 (six) hours as needed for nausea or vomiting. 90 tablet 0  . triamcinolone cream (KENALOG) 0.1 % Apply 1 application topically 3 (three) times daily. 30 g 0   Current Facility-Administered Medications on File Prior to Visit  Medication Dose Route Frequency Provider Last Rate Last Dose  . cefTRIAXone (ROCEPHIN) injection 500 mg  500 mg Intramuscular Once Vicie Mutters, PA-C      . ipratropium-albuterol (DUONEB) 0.5-2.5 (3) MG/3ML nebulizer solution 3 mL  3 mL Nebulization Once Vicie Mutters, PA-C        ROS: all negative except above.   Physical Exam: Filed Weights   05/27/16 1450  Weight: 140 lb 6.4 oz (63.7 kg)  BP 132/76   Pulse 91   Temp 97.5 F (36.4 C)   Resp 16   Ht 4' 11"  (1.499 m)   Wt 140 lb 6.4 oz (63.7 kg)   SpO2 98%   BMI 28.36 kg/m  General Appearance: Well nourished, in no apparent distress. Eyes: PERRLA, EOMs, conjunctiva no swelling or erythema Sinuses: No Frontal/maxillary tenderness ENT/Mouth: Ext aud canals clear, TMs without erythema, bulging. No erythema, swelling, or exudate on post pharynx.  Tonsils not swollen or erythematous. Hearing normal.  Neck: Supple, thyroid normal.  Respiratory: Respiratory effort normal, BS equal bilaterally without rales, rhonchi, wheezing or stridor.   Cardio: RRR with no MRGs. Brisk peripheral pulses without edema.  Abdomen: Soft, + BS.  Non tender, no guarding, rebound, hernias, masses. Lymphatics: Non tender without lymphadenopathy.  Musculoskeletal: Full ROM, 5/5 strength, normal gait.  Skin: Warm, dry without rashes, lesions, ecchymosis.  Neuro: Cranial nerves intact. Normal muscle tone, no cerebellar symptoms. Sensation intact.  Psych: Awake and oriented X 3, normal affect, Insight and Judgment appropriate.     Vicie Mutters, PA-C 3:11 PM [facility]

## 2016-05-28 ENCOUNTER — Other Ambulatory Visit: Payer: Self-pay | Admitting: Physician Assistant

## 2016-05-28 ENCOUNTER — Ambulatory Visit
Admission: RE | Admit: 2016-05-28 | Discharge: 2016-05-28 | Disposition: A | Discharge status: Home / Self Care | Payer: PPO | Source: Ambulatory Visit | Attending: Physician Assistant | Admitting: Physician Assistant

## 2016-05-28 DIAGNOSIS — R920 Mammographic microcalcification found on diagnostic imaging of breast: Secondary | ICD-10-CM | POA: Diagnosis not present

## 2016-05-28 DIAGNOSIS — R921 Mammographic calcification found on diagnostic imaging of breast: Secondary | ICD-10-CM

## 2016-05-28 DIAGNOSIS — N6011 Diffuse cystic mastopathy of right breast: Secondary | ICD-10-CM | POA: Diagnosis not present

## 2016-05-28 LAB — URINALYSIS, ROUTINE W REFLEX MICROSCOPIC
Bilirubin Urine: NEGATIVE
Ketones, ur: NEGATIVE
Nitrite: POSITIVE — AB
Specific Gravity, Urine: 1.005 (ref 1.001–1.035)
pH: 6 (ref 5.0–8.0)

## 2016-05-28 LAB — URINALYSIS, MICROSCOPIC ONLY
CRYSTALS: NONE SEEN [HPF]
Casts: NONE SEEN [LPF]
RBC / HPF: NONE SEEN RBC/HPF (ref <=2)
Squamous Epithelial / LPF: NONE SEEN [HPF] (ref <=5)
YEAST: NONE SEEN [HPF]

## 2016-05-28 MED ORDER — NITROFURANTOIN MONOHYD MACRO 100 MG PO CAPS: 100.0000 mg | ORAL_CAPSULE | Freq: Two times a day (BID) | ORAL | 0 refills | Status: AC

## 2016-05-28 NOTE — Progress Notes (Signed)
+   UTI, will send in Pepin

## 2016-05-29 LAB — URINE CULTURE

## 2016-06-02 ENCOUNTER — Ambulatory Visit: Payer: Self-pay | Admitting: Physician Assistant

## 2016-06-03 ENCOUNTER — Ambulatory Visit: Payer: Self-pay | Admitting: Physician Assistant

## 2016-06-12 DIAGNOSIS — R05 Cough: Secondary | ICD-10-CM | POA: Diagnosis not present

## 2016-06-12 DIAGNOSIS — J449 Chronic obstructive pulmonary disease, unspecified: Secondary | ICD-10-CM | POA: Diagnosis not present

## 2016-06-12 DIAGNOSIS — J209 Acute bronchitis, unspecified: Secondary | ICD-10-CM | POA: Diagnosis not present

## 2016-06-29 ENCOUNTER — Encounter: Payer: Self-pay | Admitting: Internal Medicine

## 2016-06-29 ENCOUNTER — Ambulatory Visit (INDEPENDENT_AMBULATORY_CARE_PROVIDER_SITE_OTHER): Payer: PPO | Admitting: Internal Medicine

## 2016-06-29 VITALS — BP 128/80 | HR 76 | Ht 59.0 in | Wt 137.0 lb

## 2016-06-29 DIAGNOSIS — I700 Atherosclerosis of aorta: Secondary | ICD-10-CM

## 2016-06-29 DIAGNOSIS — E1151 Type 2 diabetes mellitus with diabetic peripheral angiopathy without gangrene: Secondary | ICD-10-CM | POA: Diagnosis not present

## 2016-06-29 MED ORDER — GLIMEPIRIDE 4 MG PO TABS: 4.0000 mg | ORAL_TABLET | Freq: Two times a day (BID) | ORAL | 3 refills | Status: DC

## 2016-06-29 NOTE — Progress Notes (Signed)
Patient ID: Sara Lamb, female   DOB: August 25, 1950, 65 y.o.   MRN: [id number]   HPI: Sara Lamb is a 65 y.o.-year-old female, referred by her PCP, Vicie Mutters, PA, for management of DM2, dx in ~2010, insulin-dependent since 04/2016, uncontrolled, with complications (Atherosclerosis; reportedly cerebrovascular ds - TIAs).  Last hemoglobin A1c was: Lab Results  Component Value Date   HGBA1C 10.3 (H) 04/30/2016   HGBA1C 10.6 (H) 02/12/2016   HGBA1C 10.2 (H) 11/12/2015   Pt is on a regimen of: - Amaryl 4 mg 2x a day - Welchol 1 capsule a day She was Insulin 70/30 10 units in am and 6 units in pm (started 04/2016) >> but sugars higher after this  - 300-400 >> stopped. She was on Metformin before >> "this gave me ministrokes" She was also onTradjenta 5 mg daily before b'fast >> stopped as sugars were higher on this  Pt checks her sugars 2x a day and they are better after starting Amaryl: - am: n/c - 2h after b'fast: n/c - before lunch: (fasting): 111-160 - 2h after lunch: n/c - before dinner: 120-140 - 2h after dinner: n/c - bedtime: n/c - nighttime: n/c No lows. Lowest sugar was 111. Highest sugar was 250 (after Amaryl), 400s (before Amaryl).  Glucometer: Prodigy  Pt's meals are: - Breakfast: skips - cannot eat before 12:30 pm (N/V) - Lunch: salad - Dinner: chicken or seafood + veggies - Snacks: none  - no CKD, last BUN/creatinine:  Lab Results  Component Value Date   BUN 12 04/30/2016   BUN 12 02/12/2016   CREATININE 0.85 04/30/2016   CREATININE 0.74 02/12/2016  On Lisinopril. - last set of lipids: Lab Results  Component Value Date   CHOL 237 (H) 04/30/2016   HDL 38 (L) 04/30/2016   LDLCALC 140 (H) 04/30/2016   TRIG 296 (H) 04/30/2016   CHOLHDL 6.2 (H) 04/30/2016  Started Welchol. - last eye exam was in 2002 (!). No DR.  - no numbness and tingling in her feet.  Pt has FH of DM in M, B..  She also HTN, HL, anxiety/depression. She reduced smoking (on  Chantix) to 1/2 PPD (from 2 PPD).  ROS: Constitutional: no weight gain/loss, + fatigue, + both: subjective hyperthermia/hypothermia, + poor sleep Eyes: no blurry vision, no xerophthalmia ENT: no sore throat, no nodules palpated in throat, no dysphagia/odynophagia, no hoarseness Cardiovascular: no CP/SOB/palpitations/leg swelling Respiratory:+  Cough/no SOB/+ wheezing Gastrointestinal: no N/V/D/C Musculoskeletal: + muscle/+ joint aches Skin: + rash on post shoulders, + itching Neurological: + tremors/no numbness/tingling/dizziness, + HA Psychiatric: + both: depression/anxiety  Past Medical History:  Diagnosis Date  . Anxiety   . Atherosclerosis of aorta ([place])    via CXR 2013  . Diabetes mellitus without complication ([place])   . Hyperlipidemia   . Hypertension   . Nonalcoholic hepatosteatosis   . Unspecified vitamin D deficiency    Past Surgical History:  Procedure Laterality Date  . ABDOMINAL HYSTERECTOMY    . DILATION AND CURETTAGE OF UTERUS     Social History   Social History  . Marital status: Married    Spouse name: N/A  . Number of children: 1  . Years of education: N/A   Occupational History  . Retired   Social History Main Topics  . Smoking status: Current Every Day Smoker    Packs/day: 0.5    Types: Cigarettes  . Smokeless tobacco: Never Used       . Alcohol use No  . Drug  use: No   Current Outpatient Prescriptions on File Prior to Visit  Medication Sig Dispense Refill  . albuterol (PROVENTIL) (2.5 MG/3ML) 0.083% nebulizer solution Take 3 mLs (2.5 mg total) by nebulization every 6 (six) hours as needed for wheezing or shortness of breath. 75 mL 12  . ALPRAZolam (XANAX) 1 MG tablet TAKE ONE TABLET BY MOUTH THREE TIMES DAILY AS NEEDED 90 tablet 2  . levofloxacin (LEVAQUIN) 500 MG tablet Take 1 tablet (500 mg total) by mouth daily. 10 tablet 0  . linagliptin (TRADJENTA) 5 MG TABS tablet Take 1 tablet (5 mg total) by mouth daily. 30 tablet 3  . lisinopril  (PRINIVIL,ZESTRIL) 40 MG tablet Take 1 tablet (40 mg total) by mouth daily. 90 tablet 3  . meloxicam (MOBIC) 15 MG tablet Take 1 tablet (15 mg total) by mouth daily. 90 tablet 0  . Multiple Vitamins-Minerals (STRESS TAB NF PO) Take by mouth.    . Needles & Syringes MISC Please dispense 2 syringes and needles per day for insulin use BID 180 each 0  . OVER THE COUNTER MEDICATION percogesic 4 tabs daily for pain 325 mg/ 12.5 mg    . promethazine (PHENERGAN) 25 MG tablet Take 0.5 tablets (12.5 mg total) by mouth every 6 (six) hours as needed for nausea or vomiting. 90 tablet 0  . triamcinolone cream (KENALOG) 0.1 % Apply 1 application topically 3 (three) times daily. 30 g 0  . varenicline (CHANTIX CONTINUING MONTH PAK) 1 MG tablet Take 1 tablet (1 mg total) by mouth 2 (two) times daily. 56 tablet 2   Current Facility-Administered Medications on File Prior to Visit  Medication Dose Route Frequency Provider Last Rate Last Dose  . cefTRIAXone (ROCEPHIN) injection 500 mg  500 mg Intramuscular Once Vicie Mutters, PA-C      . ipratropium-albuterol (DUONEB) 0.5-2.5 (3) MG/3ML nebulizer solution 3 mL  3 mL Nebulization Once Vicie Mutters, PA-C       Allergies  Allergen Reactions  . Metformin And Related     ELEVATED BS. GLYBURIDE, ONGLYZA, ACTOS, INVOKANA  . Insulins Rash  . Janumet [Sitagliptin-Metformin Hcl] Rash    Shakes/ rash  . Penicillins Rash   Family History  Problem Relation Age of Onset  . Cancer Father     PROSTATE  . Diabetes Father   . Hypertension Father   . Hyperlipidemia Sister   . Heart disease Brother   . Hyperlipidemia Brother   . Hypertension Brother   . Diabetes Brother   . Heart disease Sister   . Hyperlipidemia Sister   . Hypertension Sister   . Diabetes Mother   . Stroke Mother     PE: BP 128/80   Pulse 76   Ht 4' 11"  (1.499 m)   Wt 137 lb (62.1 kg)   BMI 27.67 kg/m   Wt Readings from Last 3 Encounters:  06/29/16 137 lb (62.1 kg)  05/27/16 140 lb 6.4  oz (63.7 kg)  05/13/16 140 lb (63.5 kg)   Constitutional: overweight, in NAD Eyes: PERRLA, EOMI, no exophthalmos ENT: moist mucous membranes, no thyromegaly, no cervical lymphadenopathy Cardiovascular: RRR, No MRG Respiratory: CTA B Gastrointestinal: abdomen soft, NT, ND, BS+ Musculoskeletal: no deformities, strength intact in all 4 Skin: moist, warm, no rashes Neurological: + Mild tremor with outstretched hands, DTR normal in all 4  ASSESSMENT: 1. DM2, non-insulin-dependent, uncontrolled, with complications - atherosclerosis - reportedly TIAs  PLAN:  1. Patient with long-standing, uncontrolled diabetes, on oral antidiabetic regimen only, with improvement in her  sugars after increasing Amaryl and adding WelChol. Patient refuses to try other medicines for now as they either cause her sugars to increase or cause side effects. As her sugars are better on this regimen, I only option for now is to continue it. I advised her to start checking her sugars twice a day, rotating check times and bring the sugar log at next visit. I also suggested some dietary changes. - I suggested to:  Patient Instructions  Please continue: - Amaryl 4 mg 2x a day - Welchol 1 capsule a day  Please check sugars 1-2x a day, rotating time checks.  Please return in 1.5 months with your sugar log.   - given sugar log and advised how to fill it and to bring it at next appt  - given foot care handout and explained the principles  - given instructions for hypoglycemia management "15-15 rule"  - advised for yearly eye exams >> she absolutely needs one and I explained why - Return to clinic in 1.5 mo with sugar log   Philemon Kingdom, MD PhD [facility]

## 2016-06-29 NOTE — Patient Instructions (Addendum)
Please continue: - Amaryl 4 mg 2x a day - Welchol 1 capsule a day  Please check sugars 1-2x a day, rotating time checks.  Please return in 1.5 months with your sugar log.   PATIENT INSTRUCTIONS FOR TYPE 2 DIABETES:  **Please join MyChart!** - see attached instructions about how to join if you have not done so already.  DIET AND EXERCISE Diet and exercise is an important part of diabetic treatment.  We recommended aerobic exercise in the form of brisk walking (working between 40-60% of maximal aerobic capacity, similar to brisk walking) for 150 minutes per week (such as 30 minutes five days per week) along with 3 times per week performing 'resistance' training (using various gauge rubber tubes with handles) 5-10 exercises involving the major muscle groups (upper body, lower body and core) performing 10-15 repetitions (or near fatigue) each exercise. Start at half the above goal but build slowly to reach the above goals. If limited by weight, joint pain, or disability, we recommend daily walking in a swimming pool with water up to waist to reduce pressure from joints while allow for adequate exercise.    BLOOD GLUCOSES Monitoring your blood glucoses is important for continued management of your diabetes. Please check your blood glucoses 2-4 times a day: fasting, before meals and at bedtime (you can rotate these measurements - e.g. one day check before the 3 meals, the next day check before 2 of the meals and before bedtime, etc.).   HYPOGLYCEMIA (low blood sugar) Hypoglycemia is usually a reaction to not eating, exercising, or taking too much insulin/ other diabetes drugs.  Symptoms include tremors, sweating, hunger, confusion, headache, etc. Treat IMMEDIATELY with 15 grams of Carbs: . 4 glucose tablets .  cup regular juice/soda . 2 tablespoons raisins . 4 teaspoons sugar . 1 tablespoon honey Recheck blood glucose in 15 mins and repeat above if still symptomatic/blood glucose  <100.  RECOMMENDATIONS TO REDUCE YOUR RISK OF DIABETIC COMPLICATIONS: * Take your prescribed MEDICATION(S) * Follow a DIABETIC diet: Complex carbs, fiber rich foods, (monounsaturated and polyunsaturated) fats * AVOID saturated/trans fats, high fat foods, >2,300 mg salt per day. * EXERCISE at least 5 times a week for 30 minutes or preferably daily.  * DO NOT SMOKE OR DRINK more than 1 drink a day. * Check your FEET every day. Do not wear tightfitting shoes. Contact us if you develop an ulcer * See your EYE doctor once a year or more if needed * Get a FLU shot once a year * Get a PNEUMONIA vaccine once before and once after age 14 years  GOALS:  * Your Hemoglobin A1c of <7%  * fasting sugars need to be <130 * after meals sugars need to be <180 (2h after you start eating) * Your Systolic BP should be 696 or lower  * Your Diastolic BP should be 80 or lower  * Your HDL (Good Cholesterol) should be 40 or higher  * Your LDL (Bad Cholesterol) should be 100 or lower. * Your Triglycerides should be 150 or lower  * Your Urine microalbumin (kidney function) should be <30 * Your Body Mass Index should be 25 or lower    Please consider the following ways to cut down carbs and fat and increase fiber and micronutrients in your diet: - substitute whole grain for white bread or pasta - substitute brown rice for white rice - substitute 90-calorie flat bread pieces for slices of bread when possible - substitute sweet potatoes or yams  for white potatoes - substitute humus for margarine - substitute tofu for cheese when possible - substitute almond or rice milk for regular milk (would not drink soy milk daily due to concern for soy estrogen influence on breast cancer risk) - substitute dark chocolate for other sweets when possible - substitute water - can add lemon or orange slices for taste - for diet sodas (artificial sweeteners will trick your body that you can eat sweets without getting calories and  will lead you to overeating and weight gain in the long run) - do not skip breakfast or other meals (this will slow down the metabolism and will result in more weight gain over time)  - can try smoothies made from fruit and almond/rice milk in am instead of regular breakfast - can also try old-fashioned (not instant) oatmeal made with almond/rice milk in am - order the dressing on the side when eating salad at a restaurant (pour less than half of the dressing on the salad) - eat as little meat as possible - can try juicing, but should not forget that juicing will get rid of the fiber, so would alternate with eating raw veg./fruits or drinking smoothies - use as little oil as possible, even when using olive oil - can dress a salad with a mix of balsamic vinegar and lemon juice, for e.g. - use agave nectar, stevia sugar, or regular sugar rather than artificial sweateners - steam or broil/roast veggies  - snack on veggies/fruit/nuts (unsalted, preferably) when possible, rather than processed foods - reduce or eliminate aspartame in diet (it is in diet sodas, chewing gum, etc) Read the labels!  Try to read Dr. Janene Harvey book: "Program for Reversing Diabetes" for other ideas for healthy eating.

## 2016-07-01 ENCOUNTER — Telehealth: Payer: Self-pay

## 2016-07-01 ENCOUNTER — Other Ambulatory Visit: Payer: Self-pay | Admitting: Physician Assistant

## 2016-07-01 MED ORDER — ALPRAZOLAM 1 MG PO TABS: 1.0000 mg | ORAL_TABLET | Freq: Three times a day (TID) | ORAL | 2 refills | Status: DC | PRN

## 2016-07-01 NOTE — Telephone Encounter (Signed)
Pt's xanax was called into pharmacy.

## 2016-07-12 DIAGNOSIS — J449 Chronic obstructive pulmonary disease, unspecified: Secondary | ICD-10-CM | POA: Diagnosis not present

## 2016-07-12 DIAGNOSIS — R05 Cough: Secondary | ICD-10-CM | POA: Diagnosis not present

## 2016-07-12 DIAGNOSIS — J209 Acute bronchitis, unspecified: Secondary | ICD-10-CM | POA: Diagnosis not present

## 2016-08-11 ENCOUNTER — Ambulatory Visit (INDEPENDENT_AMBULATORY_CARE_PROVIDER_SITE_OTHER): Payer: PPO | Admitting: Physician Assistant

## 2016-08-11 ENCOUNTER — Encounter: Payer: Self-pay | Admitting: Physician Assistant

## 2016-08-11 VITALS — BP 126/82 | HR 68 | Temp 97.5°F | Resp 16 | Ht 59.0 in | Wt 142.8 lb

## 2016-08-11 DIAGNOSIS — R350 Frequency of micturition: Secondary | ICD-10-CM

## 2016-08-11 DIAGNOSIS — J449 Chronic obstructive pulmonary disease, unspecified: Secondary | ICD-10-CM

## 2016-08-11 DIAGNOSIS — K760 Fatty (change of) liver, not elsewhere classified: Secondary | ICD-10-CM | POA: Diagnosis not present

## 2016-08-11 DIAGNOSIS — R0600 Dyspnea, unspecified: Secondary | ICD-10-CM

## 2016-08-11 DIAGNOSIS — I10 Essential (primary) hypertension: Secondary | ICD-10-CM

## 2016-08-11 DIAGNOSIS — M810 Age-related osteoporosis without current pathological fracture: Secondary | ICD-10-CM

## 2016-08-11 DIAGNOSIS — E559 Vitamin D deficiency, unspecified: Secondary | ICD-10-CM | POA: Diagnosis not present

## 2016-08-11 DIAGNOSIS — E1151 Type 2 diabetes mellitus with diabetic peripheral angiopathy without gangrene: Secondary | ICD-10-CM

## 2016-08-11 DIAGNOSIS — F419 Anxiety disorder, unspecified: Secondary | ICD-10-CM

## 2016-08-11 DIAGNOSIS — Z79899 Other long term (current) drug therapy: Secondary | ICD-10-CM

## 2016-08-11 DIAGNOSIS — R6889 Other general symptoms and signs: Secondary | ICD-10-CM

## 2016-08-11 DIAGNOSIS — I700 Atherosclerosis of aorta: Secondary | ICD-10-CM | POA: Diagnosis not present

## 2016-08-11 DIAGNOSIS — R1013 Epigastric pain: Secondary | ICD-10-CM

## 2016-08-11 DIAGNOSIS — E785 Hyperlipidemia, unspecified: Secondary | ICD-10-CM | POA: Diagnosis not present

## 2016-08-11 DIAGNOSIS — Z0001 Encounter for general adult medical examination with abnormal findings: Secondary | ICD-10-CM

## 2016-08-11 DIAGNOSIS — F17200 Nicotine dependence, unspecified, uncomplicated: Secondary | ICD-10-CM

## 2016-08-11 DIAGNOSIS — Z9114 Patient's other noncompliance with medication regimen: Secondary | ICD-10-CM | POA: Diagnosis not present

## 2016-08-11 DIAGNOSIS — J441 Chronic obstructive pulmonary disease with (acute) exacerbation: Secondary | ICD-10-CM

## 2016-08-11 LAB — CBC WITH DIFFERENTIAL/PLATELET
BASOS ABS: 89 {cells}/uL (ref 0–200)
Basophils Relative: 1 %
EOS ABS: 1068 {cells}/uL — AB (ref 15–500)
Eosinophils Relative: 12 %
HEMATOCRIT: 44.4 % (ref 35.0–45.0)
Hemoglobin: 15 g/dL (ref 11.7–15.5)
LYMPHS PCT: 31 %
Lymphs Abs: 2759 cells/uL (ref 850–3900)
MCH: 30 pg (ref 27.0–33.0)
MCHC: 33.8 g/dL (ref 32.0–36.0)
MCV: 88.8 fL (ref 80.0–100.0)
MONO ABS: 712 {cells}/uL (ref 200–950)
MONOS PCT: 8 %
MPV: 9.9 fL (ref 7.5–12.5)
NEUTROS PCT: 48 %
Neutro Abs: 4272 cells/uL (ref 1500–7800)
Platelets: 243 10*3/uL (ref 140–400)
RBC: 5 MIL/uL (ref 3.80–5.10)
RDW: 13.1 % (ref 11.0–15.0)
WBC: 8.9 10*3/uL (ref 3.8–10.8)

## 2016-08-11 MED ORDER — GLIMEPIRIDE 4 MG PO TABS: 4.0000 mg | ORAL_TABLET | Freq: Two times a day (BID) | ORAL | 1 refills | Status: DC

## 2016-08-11 MED ORDER — DOXYCYCLINE HYCLATE 100 MG PO CAPS: ORAL_CAPSULE | ORAL | 0 refills | Status: DC

## 2016-08-11 MED ORDER — FUROSEMIDE 40 MG PO TABS: 20.0000 mg | ORAL_TABLET | Freq: Every day | ORAL | 11 refills | Status: DC

## 2016-08-11 NOTE — Progress Notes (Signed)
CPE and 3 MONTH  Assessment:    Essential hypertension - continue medications, DASH diet, exercise and monitor at home. Call if greater than 130/80.  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 Advised to quit smoking - Hemoglobin A1c  Chronic obstructive pulmonary disease, unspecified COPD type ([place]) Advised to quit smoking.   T2_NIDDM, poorly controlled Discussed general issues about diabetes pathophysiology and management., Educational material distributed., Suggested low cholesterol diet., Encouraged aerobic exercise., Discussed foot care., Reminded to get yearly retinal exam. - Hemoglobin H6F   Nonalcoholic hepatosteatosis Weight loss advised, monitor sugars.  - Hepatic function panel  Atherosclerosis of abdominal aorta (HCC) Control blood pressure, cholesterol, glucose, increase exercise.  Advised to stop smoking - Lipid panel - Hemoglobin A1c   Hyperlipidemia, unspecified hyperlipidemia type Not willing to get on medication, will check cholesterol and attempt - Lipid panel  Tobacco use disorder Smoking cessation-  instruction/counseling given, counseled patient on the dangers of tobacco use, advised patient to stop smoking, and reviewed strategies to maximize success, continue chantix    Vitamin D deficiency Continue supplement   Medication management - Magnesium   Anxiety  Poor compliance with medication LONG discussion about compliance, needs to stay on medication  Osteoporosis, unspecified osteoporosis type, unspecified pathological fracture presence Has had DEXA, declines treatment  COPD exacerbation (HCC) Will  refill ABX doxycycline, prednisone, continue duoneb, symbicort, and at this time since it has been since Oct will get CT chest to rule out pneumonia, mass/CA ? Fluid over load, has been off fluid pill due to hyponatremia, weight is up but no edema bilateral legs but + PND/orthopnea,, will add on lasix 60m, get BNP -     CT Chest Wo Contrast; Future -     doxycycline (VIBRAMYCIN) 100 MG capsule; Take 1 capsule twice daily with food  Abdominal pain, epigastric -     [place]domen Complete; Future -Get on prilosec, check labs, rule out fatty liver/fibrosis.   Dyspnea, unspecified type ? COPD versus fluid overload, get on lasix, get CT lungs, BNP Weight daily, fluid restrict, salt restrict Any worsening SOB go to ER -     Brain natriuretic peptide (([id number] -     furosemide (LASIX) 40 MG tablet; Take 0.5 tablets (20 mg total) by mouth daily. -     CT Chest Wo Contrast; Future -     doxycycline (VIBRAMYCIN) 100 MG capsule; Take 1 capsule twice daily with food  Encounter for general adult medical examination with abnormal findings  Urinary frequency -     Urinalysis, Routine w reflex microscopic -     Urine culture   Over 40 minutes of exam, counseling, chart review and critical decision making was performed Future Appointments Date Time Provider DBowie 08/13/2016 1:15 PM CPhilemon Kingdom MD [facility] None  08/16/2017 2:00 PM AVicie Mutters PA-C [facility] None     Subjective:  GMCKENSI REDINGERis a 66y.o. female who presents for CPE and 3 month follow up for uncontrolled DM.   She is very noncompliant with test, vaccines, and has an aversion to medication with " reactions" to the majority of medications.   She has had coughing with mucus production x Oct, has been on clarithromycin. She is  on chantix, down to 1 pack a day from 2, she has been doing the Duoneb twice daily, she is on symbicort twice daily, she was given rocephin/predniosone 10/18, has had zpak and levaquin  without help. States she is still not better, still having white/yellow mucus, will have to sit up at night, having bilateral rib pain.  Wt Readings from Last 3 Encounters:  08/11/16 142 lb 12.8 oz (64.8 kg)  06/29/16 137 lb (62.1 kg)  05/27/16 140 lb 6.4 oz (63.7 kg)    He has had left/right upper quadrant pain x 5-6 months, worse with turning/twisting either way   Her blood pressure has not been controlled at home, today their BP is BP: 126/82 She does not workout. She denies chest pain, shortness of breath, dizziness.  She is not on cholesterol medication and denies myalgias. Her cholesterol is not at goal. The cholesterol last visit was:   Lab Results  Component Value Date   CHOL 237 (H) 04/30/2016   HDL 38 (L) 04/30/2016   LDLCALC 140 (H) 04/30/2016   TRIG 296 (H) 04/30/2016   CHOLHDL 6.2 (H) 04/30/2016   She has not been working on diet and exercise for Diabetes with diabetic chronic kidney disease, she  she is not on bASA states she can take it due to fatigue, she is on amaryl 4 mg BID and welchol 1 capsules a day, She states she has cut out all carbs. , she is on ACE/ARB, and denies polydipsia, polyuria and visual disturbances. Last A1C was:  Lab Results  Component Value Date   HGBA1C 10.3 (H) 04/30/2016   Last GFR: Lab Results  Component Value Date   GFRNONAA 72 04/30/2016   Patient is on Vitamin D supplement.   Lab Results  Component Value Date   VD25OH 11 (L) 04/30/2015     BMI is Body mass index is 28.84 kg/m., she is working on diet and exercise. Wt Readings from Last 3 Encounters:  08/11/16 142 lb 12.8 oz (64.8 kg)  06/29/16 137 lb (62.1 kg)  05/27/16 140 lb 6.4 oz (63.7 kg)     Medication Review: Current Outpatient Prescriptions on File Prior to Visit  Medication Sig Dispense Refill  . albuterol (PROVENTIL) (2.5 MG/3ML) 0.083% nebulizer solution Take 3 mLs (2.5 mg total) by nebulization every 6 (six) hours as needed for wheezing or shortness of breath. 75  mL 12  . ALPRAZolam (XANAX) 1 MG tablet Take 1 tablet (1 mg total) by mouth 3 (three) times daily as needed. 90 tablet 2  . glimepiride (AMARYL) 4 MG tablet Take 1 tablet (4 mg total) by mouth 2 (two) times daily before lunch and supper. 180 tablet 3  . lisinopril (PRINIVIL,ZESTRIL) 40 MG tablet Take 1 tablet (40 mg total) by mouth daily. 90 tablet 3  . meloxicam (MOBIC) 15 MG tablet Take 1 tablet (15 mg total) by mouth daily. 90 tablet 0  . Multiple Vitamins-Minerals (STRESS TAB NF PO) Take by mouth.    Marland Kitchen OVER THE COUNTER MEDICATION percogesic 4 tabs daily for pain 325 mg/ 12.5 mg    . varenicline (CHANTIX CONTINUING MONTH PAK) 1 MG tablet Take 1 tablet (1 mg total) by mouth 2 (two) times daily. 56 tablet 2   Current Facility-Administered Medications on File Prior to Visit  Medication Dose Route Frequency Provider Last Rate Last Dose  . ipratropium-albuterol (DUONEB) 0.5-2.5 (3) MG/3ML nebulizer solution 3 mL  3 mL Nebulization Once Vicie Mutters, PA-C  Allergies  Allergen Reactions  . Metformin And Related     ELEVATED BS. GLYBURIDE, ONGLYZA, ACTOS, INVOKANA  . Insulins Rash  . Janumet [Sitagliptin-Metformin Hcl] Rash    Shakes/ rash  . Penicillins Rash    Current Problems (verified) Patient Active Problem List   Diagnosis Date Noted  . Osteoporosis 05/27/2016  . Atherosclerosis of abdominal aorta ([place]) 04/30/2016  . Type 2 diabetes mellitus with atherosclerosis of aorta ([place]) 04/30/2015  . COPD (chronic obstructive pulmonary disease) ([place]) 04/30/2015  . Tobacco use disorder 04/08/2015  . Poor compliance with medication 11/05/2014  . Medication management 12/28/2013  . Nonalcoholic hepatosteatosis   . Vitamin D deficiency   . Anxiety   . Hypertension   . Hyperlipidemia     Screening Tests Immunization History  Administered Date(s) Administered  . Td 06/26/2001  . Tdap 04/30/2015   Preventative care: Last colonoscopy: declines Last mammogram: 05/25/2016 Last  pap smear/pelvic exam: declines  DEXA: 04/2016 MRI brain 2016 CXR 04/2016 bronchitis  Prior vaccinations: TD or Tdap:2016  Influenza: Declines  Pneumococcal: declines Prevnar13: declines Shingles/Zostavax: declines  Names of Other Physician/Practitioners you currently use: 1. [facility] here for primary care 2. none, eye doctor, encouraged to see eye doctor 3. Dentures dentist Patient Care Team: Unk Pinto, MD as PCP - General (Internal Medicine)  SURGICAL HISTORY She  has a past surgical history that includes Abdominal hysterectomy and Dilation and curettage of uterus. FAMILY HISTORY Her family history includes Cancer in her father; Diabetes in her brother, father, and mother; Heart disease in her brother and sister; Hyperlipidemia in her brother, sister, and sister; Hypertension in her brother, father, and sister; Stroke in her mother. SOCIAL HISTORY She  reports that she has been smoking Cigarettes.  She has been smoking about 2.00 packs per day. She has never used smokeless tobacco. She reports that she does not drink alcohol or use drugs.    Review of Systems  Constitutional: Negative for chills, fever and malaise/fatigue.  HENT: Negative for congestion, ear pain and sore throat.   Eyes: Negative.   Respiratory: Positive for cough and shortness of breath. Negative for wheezing.   Cardiovascular: Positive for orthopnea (sleeping on 2 pillows) and PND. Negative for chest pain, palpitations, claudication and leg swelling.  Gastrointestinal: Positive for abdominal pain (RUQ pain), constipation and nausea. Negative for blood in stool, diarrhea, heartburn, melena and vomiting.  Genitourinary: Negative.   Musculoskeletal: Positive for back pain and myalgias.  Skin: Negative.   Neurological: Negative for dizziness, sensory change, loss of consciousness and headaches.  Psychiatric/Behavioral: Negative for depression. The patient is not  nervous/anxious and does not have insomnia.      Objective:     Today's Vitals   08/11/16 1411  BP: 126/82  Pulse: 68  Resp: 16  Temp: 97.5 F (36.4 C)  SpO2: 98%  Weight: 142 lb 12.8 oz (64.8 kg)  Height: 4' 11"  (1.499 m)   Body mass index is 28.84 kg/m.  General appearance: alert, no distress, WD/WN, female HEENT: normocephalic, sclerae anicteric, TMs pearly, nares patent, no discharge or erythema, pharynx normal Oral cavity: MMM, no lesions Neck: supple, no lymphadenopathy, no thyromegaly, no masses Heart: RRR, normal S1, S2, no murmurs Lungs: decreased BS, with diffuse wheezing and rhonchi bilateral, no rales Abdomen: +bs, soft, obese, tender epigastric and RUQ pain to palpation, + distended, no masses, no hepatomegaly, no splenomegaly Musculoskeletal: nontender, no swelling, no obvious deformity Extremities: no edema, no cyanosis, no clubbing Pulses:  2+ symmetric, upper and lower extremities, normal cap refill Neurological: alert, oriented x 3, CN2-12 intact, strength normal upper extremities and lower extremities, sensation normal throughout, DTRs 2+ throughout, no cerebellar signs, gait antalgic Psychiatric: normal affect, behavior normal, pleasant    Vicie Mutters, PA-C   08/11/2016

## 2016-08-11 NOTE — Patient Instructions (Addendum)
Please take 1/2 of the lasix daily for 7 days If you feel week, leg cramping, or bad then please stop Please take the doxycyline Take the prednisone 2 pills a day for 3 days, then 1 a day for 3 days.  Weigh yourself daily  We are getting a CT chest and AB [place] on you If you have any worsening shortness of breath, stomach pain, chest pain please go to the ER  Get on miralax daily for constipation, may help your stomach.   Chronic Obstructive Pulmonary Disease Chronic obstructive pulmonary disease (COPD) is a common lung condition in which airflow from the lungs is limited. COPD is a general term that can be used to describe many different lung problems that limit airflow, including both chronic bronchitis and emphysema. If you have COPD, your lung function will probably never return to normal, but there are measures you can take to improve lung function and make yourself feel better. What are the causes?  Smoking (common).  Exposure to secondhand smoke.  Genetic problems.  Chronic inflammatory lung diseases or recurrent infections. What are the signs or symptoms?  Shortness of breath, especially with physical activity.  Deep, persistent (chronic) cough with a large amount of thick mucus.  Wheezing.  Rapid breaths (tachypnea).  Gray or bluish discoloration (cyanosis) of the skin, especially in your fingers, toes, or lips.  Fatigue.  Weight loss.  Frequent infections or episodes when breathing symptoms become much worse (exacerbations).  Chest tightness. How is this diagnosed? Your health care provider will take a medical history and perform a physical examination to diagnose COPD. Additional tests for COPD may include:  Lung (pulmonary) function tests.  Chest X-ray.  CT scan.  Blood tests. How is this treated? Treatment for COPD may include:  Inhaler and nebulizer medicines. These help manage the symptoms of COPD and make your breathing more  comfortable.  Supplemental oxygen. Supplemental oxygen is only helpful if you have a low oxygen level in your blood.  Exercise and physical activity. These are beneficial for nearly all people with COPD.  Lung surgery or transplant.  Nutrition therapy to gain weight, if you are underweight.  Pulmonary rehabilitation. This may involve working with a team of health care providers and specialists, such as respiratory, occupational, and physical therapists. Follow these instructions at home:  Take all medicines (inhaled or pills) as directed by your health care provider.  Avoid over-the-counter medicines or cough syrups that dry up your airway (such as antihistamines) and slow down the elimination of secretions unless instructed otherwise by your health care provider.  If you are a smoker, the most important thing that you can do is stop smoking. Continuing to smoke will cause further lung damage and breathing trouble. Ask your health care provider for help with quitting smoking. He or she can direct you to community resources or hospitals that provide support.  Avoid exposure to irritants such as smoke, chemicals, and fumes that aggravate your breathing.  Use oxygen therapy and pulmonary rehabilitation if directed by your health care provider. If you require home oxygen therapy, ask your health care provider whether you should purchase a pulse oximeter to measure your oxygen level at home.  Avoid contact with individuals who have a contagious illness.  Avoid extreme temperature and humidity changes.  Eat healthy foods. Eating smaller, more frequent meals and resting before meals may help you maintain your strength.  Stay active, but balance activity with periods of rest. Exercise and physical activity will help  you maintain your ability to do things you want to do.  Preventing infection and hospitalization is very important when you have COPD. Make sure to receive all the vaccines your  health care provider recommends, especially the pneumococcal and influenza vaccines. Ask your health care provider whether you need a pneumonia vaccine.  Learn and use relaxation techniques to manage stress.  Learn and use controlled breathing techniques as directed by your health care provider. Controlled breathing techniques include: 1. Pursed lip breathing. Start by breathing in (inhaling) through your nose for 1 second. Then, purse your lips as if you were going to whistle and breathe out (exhale) through the pursed lips for 2 seconds. 2. Diaphragmatic breathing. Start by putting one hand on your abdomen just above your waist. Inhale slowly through your nose. The hand on your abdomen should move out. Then purse your lips and exhale slowly. You should be able to feel the hand on your abdomen moving in as you exhale.  Learn and use controlled coughing to clear mucus from your lungs. Controlled coughing is a series of short, progressive coughs. The steps of controlled coughing are: 1. Lean your head slightly forward. 2. Breathe in deeply using diaphragmatic breathing. 3. Try to hold your breath for 3 seconds. 4. Keep your mouth slightly open while coughing twice. 5. Spit any mucus out into a tissue. 6. Rest and repeat the steps once or twice as needed. Contact a health care provider if:  You are coughing up more mucus than usual.  There is a change in the color or thickness of your mucus.  Your breathing is more labored than usual.  Your breathing is faster than usual. Get help right away if:  You have shortness of breath while you are resting.  You have shortness of breath that prevents you from:  Being able to talk.  Performing your usual physical activities.  You have chest pain lasting longer than 5 minutes.  Your skin color is more cyanotic than usual.  You measure low oxygen saturations for longer than 5 minutes with a pulse oximeter. This information is not intended to  replace advice given to you by your health care provider. Make sure you discuss any questions you have with your health care provider. Document Released: 04/22/2005 Document Revised: 12/19/2015 Document Reviewed: 03/09/2013 Elsevier Interactive Patient Education  2017 [employer].

## 2016-08-12 DIAGNOSIS — J209 Acute bronchitis, unspecified: Secondary | ICD-10-CM | POA: Diagnosis not present

## 2016-08-12 DIAGNOSIS — R05 Cough: Secondary | ICD-10-CM | POA: Diagnosis not present

## 2016-08-12 DIAGNOSIS — J449 Chronic obstructive pulmonary disease, unspecified: Secondary | ICD-10-CM | POA: Diagnosis not present

## 2016-08-12 LAB — BASIC METABOLIC PANEL WITH GFR
BUN: 7 mg/dL (ref 7–25)
CALCIUM: 9.4 mg/dL (ref 8.6–10.4)
CO2: 25 mmol/L (ref 20–31)
CREATININE: 0.62 mg/dL (ref 0.50–0.99)
Chloride: 98 mmol/L (ref 98–110)
GFR, Est Non African American: >89 mL/min (ref >=60)
GLUCOSE: 121 mg/dL — AB (ref 65–99)
POTASSIUM: 4 mmol/L (ref 3.5–5.3)
Sodium: 135 mmol/L (ref 135–146)

## 2016-08-12 LAB — LIPID PANEL
Cholesterol: 241 mg/dL — ABNORMAL HIGH (ref <200)
HDL: 42 mg/dL — ABNORMAL LOW (ref >50)
LDL CALC: 150 mg/dL — AB (ref <100)
TRIGLYCERIDES: 245 mg/dL — AB (ref <150)
Total CHOL/HDL Ratio: 5.7 Ratio — ABNORMAL HIGH (ref <5.0)
VLDL: 49 mg/dL — ABNORMAL HIGH (ref <30)

## 2016-08-12 LAB — URINALYSIS, ROUTINE W REFLEX MICROSCOPIC
Bilirubin Urine: NEGATIVE
KETONES UR: NEGATIVE
NITRITE: NEGATIVE
Specific Gravity, Urine: 1.008 (ref 1.001–1.035)
pH: 5.5 (ref 5.0–8.0)

## 2016-08-12 LAB — HEPATIC FUNCTION PANEL
ALBUMIN: 3.7 g/dL (ref 3.6–5.1)
ALK PHOS: 121 U/L (ref 33–130)
ALT: 24 U/L (ref 6–29)
AST: 28 U/L (ref 10–35)
Bilirubin, Direct: 0.1 mg/dL (ref <=0.2)
Indirect Bilirubin: 0.2 mg/dL (ref 0.2–1.2)
TOTAL PROTEIN: 7.2 g/dL (ref 6.1–8.1)
Total Bilirubin: 0.3 mg/dL (ref 0.2–1.2)

## 2016-08-12 LAB — URINALYSIS, MICROSCOPIC ONLY
CRYSTALS: NONE SEEN [HPF]
Casts: NONE SEEN [LPF]
RBC / HPF: NONE SEEN RBC/HPF (ref <=2)
Squamous Epithelial / LPF: NONE SEEN [HPF] (ref <=5)
YEAST: NONE SEEN [HPF]

## 2016-08-12 LAB — BRAIN NATRIURETIC PEPTIDE: Brain Natriuretic Peptide: 38.7 pg/mL (ref <100)

## 2016-08-12 LAB — TSH: TSH: 1.11 m[IU]/L

## 2016-08-12 LAB — VITAMIN D 25 HYDROXY (VIT D DEFICIENCY, FRACTURES): VIT D 25 HYDROXY: 13 ng/mL — AB (ref 30–100)

## 2016-08-12 LAB — HEMOGLOBIN A1C
Hgb A1c MFr Bld: 9.1 % — ABNORMAL HIGH (ref <5.7)
Mean Plasma Glucose: 214 mg/dL

## 2016-08-12 LAB — MAGNESIUM: MAGNESIUM: 1.9 mg/dL (ref 1.5–2.5)

## 2016-08-13 ENCOUNTER — Ambulatory Visit: Payer: PPO | Admitting: Internal Medicine

## 2016-08-13 LAB — URINE CULTURE

## 2016-08-14 ENCOUNTER — Other Ambulatory Visit: Payer: Self-pay | Admitting: Physician Assistant

## 2016-08-14 MED ORDER — NITROFURANTOIN MONOHYD MACRO 100 MG PO CAPS: 100.0000 mg | ORAL_CAPSULE | Freq: Two times a day (BID) | ORAL | 0 refills | Status: DC

## 2016-08-14 NOTE — Progress Notes (Signed)
Pt aware of lab results & voiced understanding of those results. & a message was sent to front office for: need 1 month follow up.

## 2016-08-28 ENCOUNTER — Ambulatory Visit
Admission: RE | Admit: 2016-08-28 | Discharge: 2016-08-28 | Disposition: A | Discharge status: Home / Self Care | Payer: PPO | Source: Ambulatory Visit | Attending: Physician Assistant | Admitting: Physician Assistant

## 2016-08-28 DIAGNOSIS — J40 Bronchitis, not specified as acute or chronic: Secondary | ICD-10-CM | POA: Diagnosis not present

## 2016-08-28 DIAGNOSIS — J449 Chronic obstructive pulmonary disease, unspecified: Secondary | ICD-10-CM

## 2016-08-28 DIAGNOSIS — J441 Chronic obstructive pulmonary disease with (acute) exacerbation: Secondary | ICD-10-CM

## 2016-08-28 DIAGNOSIS — R1013 Epigastric pain: Secondary | ICD-10-CM

## 2016-08-28 DIAGNOSIS — R0600 Dyspnea, unspecified: Secondary | ICD-10-CM

## 2016-08-28 DIAGNOSIS — F17200 Nicotine dependence, unspecified, uncomplicated: Secondary | ICD-10-CM

## 2016-08-28 DIAGNOSIS — K8020 Calculus of gallbladder without cholecystitis without obstruction: Secondary | ICD-10-CM | POA: Diagnosis not present

## 2016-08-28 NOTE — Progress Notes (Signed)
Pt aware of lab results & voiced understanding of those results.

## 2016-08-31 ENCOUNTER — Other Ambulatory Visit: Payer: Self-pay | Admitting: Physician Assistant

## 2016-08-31 DIAGNOSIS — R1011 Right upper quadrant pain: Secondary | ICD-10-CM

## 2016-08-31 DIAGNOSIS — K8010 Calculus of gallbladder with chronic cholecystitis without obstruction: Secondary | ICD-10-CM

## 2016-09-01 NOTE — Progress Notes (Signed)
Pt states she has had some nausea but nothing to serious.  Mrs. Blankenbaker states she took something for the nausea and it helped. Pt aware of lab results & voiced understanding of those results.

## 2016-09-11 ENCOUNTER — Ambulatory Visit: Payer: Self-pay | Admitting: General Surgery

## 2016-09-11 DIAGNOSIS — K8010 Calculus of gallbladder with chronic cholecystitis without obstruction: Secondary | ICD-10-CM | POA: Diagnosis not present

## 2016-09-11 DIAGNOSIS — Z720 Tobacco use: Secondary | ICD-10-CM | POA: Diagnosis not present

## 2016-09-12 DIAGNOSIS — J449 Chronic obstructive pulmonary disease, unspecified: Secondary | ICD-10-CM | POA: Diagnosis not present

## 2016-09-12 DIAGNOSIS — J209 Acute bronchitis, unspecified: Secondary | ICD-10-CM | POA: Diagnosis not present

## 2016-09-12 DIAGNOSIS — R05 Cough: Secondary | ICD-10-CM | POA: Diagnosis not present

## 2016-09-15 ENCOUNTER — Encounter: Payer: Self-pay | Admitting: Physician Assistant

## 2016-09-15 ENCOUNTER — Ambulatory Visit (INDEPENDENT_AMBULATORY_CARE_PROVIDER_SITE_OTHER): Payer: PPO | Admitting: Physician Assistant

## 2016-09-15 VITALS — BP 130/80 | HR 96 | Temp 97.7°F | Resp 14 | Ht 59.0 in | Wt 140.2 lb

## 2016-09-15 DIAGNOSIS — K8010 Calculus of gallbladder with chronic cholecystitis without obstruction: Secondary | ICD-10-CM

## 2016-09-15 DIAGNOSIS — J449 Chronic obstructive pulmonary disease, unspecified: Secondary | ICD-10-CM

## 2016-09-15 DIAGNOSIS — I700 Atherosclerosis of aorta: Secondary | ICD-10-CM

## 2016-09-15 DIAGNOSIS — N3000 Acute cystitis without hematuria: Secondary | ICD-10-CM

## 2016-09-15 DIAGNOSIS — E1151 Type 2 diabetes mellitus with diabetic peripheral angiopathy without gangrene: Secondary | ICD-10-CM | POA: Diagnosis not present

## 2016-09-15 DIAGNOSIS — I10 Essential (primary) hypertension: Secondary | ICD-10-CM

## 2016-09-15 DIAGNOSIS — K8020 Calculus of gallbladder without cholecystitis without obstruction: Secondary | ICD-10-CM | POA: Insufficient documentation

## 2016-09-15 LAB — CBC WITH DIFFERENTIAL/PLATELET
BASOS PCT: 1 %
Basophils Absolute: 89 cells/uL (ref 0–200)
Eosinophils Absolute: 623 cells/uL — ABNORMAL HIGH (ref 15–500)
Eosinophils Relative: 7 %
HCT: 44.8 % (ref 35.0–45.0)
Hemoglobin: 15.3 g/dL (ref 11.7–15.5)
Lymphocytes Relative: 28 %
Lymphs Abs: 2492 cells/uL (ref 850–3900)
MCH: 30.2 pg (ref 27.0–33.0)
MCHC: 34.2 g/dL (ref 32.0–36.0)
MCV: 88.5 fL (ref 80.0–100.0)
MONO ABS: 534 {cells}/uL (ref 200–950)
MONOS PCT: 6 %
MPV: 10 fL (ref 7.5–12.5)
NEUTROS ABS: 5162 {cells}/uL (ref 1500–7800)
Neutrophils Relative %: 58 %
PLATELETS: 267 10*3/uL (ref 140–400)
RBC: 5.06 MIL/uL (ref 3.80–5.10)
RDW: 13 % (ref 11.0–15.0)
WBC: 8.9 10*3/uL (ref 3.8–10.8)

## 2016-09-15 LAB — COMPREHENSIVE METABOLIC PANEL
ALK PHOS: 129 U/L (ref 33–130)
ALT: 26 U/L (ref 6–29)
AST: 25 U/L (ref 10–35)
Albumin: 3.8 g/dL (ref 3.6–5.1)
BILIRUBIN TOTAL: 0.4 mg/dL (ref 0.2–1.2)
BUN: 6 mg/dL — AB (ref 7–25)
CO2: 29 mmol/L (ref 20–31)
CREATININE: 0.62 mg/dL (ref 0.50–0.99)
Calcium: 9.5 mg/dL (ref 8.6–10.4)
Chloride: 95 mmol/L — ABNORMAL LOW (ref 98–110)
GLUCOSE: 159 mg/dL — AB (ref 65–99)
POTASSIUM: 3.9 mmol/L (ref 3.5–5.3)
SODIUM: 134 mmol/L — AB (ref 135–146)
TOTAL PROTEIN: 7.3 g/dL (ref 6.1–8.1)

## 2016-09-15 MED ORDER — BUDESONIDE-FORMOTEROL FUMARATE 160-4.5 MCG/ACT IN AERO: 2.0000 | INHALATION_SPRAY | Freq: Two times a day (BID) | RESPIRATORY_TRACT | 3 refills | Status: DC

## 2016-09-15 NOTE — Progress Notes (Signed)
Assessment and Plan:  Essential hypertension - continue medications, DASH diet, exercise and monitor at home. Call if greater than 130/80. Monitor BP pre and post surgical.   -     CBC with Differential/Platelet -     Comprehensive metabolic panel  Type 2 diabetes mellitus with atherosclerosis of aorta ([place]) Discussed general issues about diabetes pathophysiology and management., Educational material distributed., Suggested low cholesterol diet., Encouraged aerobic exercise., Discussed foot care., Reminded to get yearly retinal exam. Has been improved, will need sliding scale and glucose monitoring pre and post surgery.   Chronic obstructive pulmonary disease, unspecified COPD type (HCC) -     budesonide-formoterol (SYMBICORT) 160-4.5 MCG/ACT inhaler; Inhale 2 puffs into the lungs 2 (two) times daily.  Acute cystitis without hematuria -     Urinalysis, Routine w reflex microscopic -     Urine culture  Calculus of gallbladder with chronic cholecystitis without obstruction -     CBC with Differential/Platelet -     Comprehensive metabolic panel - will get labs here and send to surgeon, has had normal EKG in Oct, no CP and shortness of breath has improved, recent CT chest - Surgical Clearance: We will send letter to the surgeon for surgical clearance.   Recommendations:  DVT prophylaxis, monitoring of blood sugars and blood pressure post operatively. Patient will schedule an appointment in the office post operatively for follow up.    Future Appointments Date Time Provider [place]  09/15/2016 2:30 PM Vicie Mutters, PA-C [facility] None  10/09/2016 3:30 PM Philemon Kingdom, MD [facility] None  11/10/2016 3:30 PM Unk Pinto, MD [facility] None  08/16/2017 2:00 PM Vicie Mutters, PA-C [facility] None     HPI 66 y.o.female presents for COPD exacerbation, AB pain, UTI.   She has been having AB pain intermittent for a year with nausea, [place] AB showed + gallstone and  inflammation, she is set up for lap choley. Will occ have sharp right sided pain, some nausea, no fever, chills, no vomiting.  She will be medically cleared today.  Her sugar is doing better, running 140's in the morning.    She denies chest pain, SOB improved. Recent EKG 04/2016 was normal without changes.   Patient had CT scan chest showed multiple nodules, will need repeat CT scan 1 year, patient has decreased smoking from 2 packs to 1/2 pack and would like to quit. She states her breathing has improved, she only uses Albuterol as needed but also only uses symbicort as needed. Will give samples and will start daily.   Her last urine showed + infection, treated with macrobid, here for repeat.   Lab Results  Component Value Date   HGBA1C 9.1 (H) 08/11/2016    Past Medical History:  Diagnosis Date  . Anxiety   . Atherosclerosis of aorta ([place])    via CXR 2013  . Diabetes mellitus without complication ([place])   . Hyperlipidemia   . Hypertension   . Nonalcoholic hepatosteatosis   . Unspecified vitamin D deficiency      Allergies  Allergen Reactions  . Metformin And Related     ELEVATED BS. GLYBURIDE, ONGLYZA, ACTOS, INVOKANA  . Insulins Rash  . Janumet [Sitagliptin-Metformin Hcl] Rash    Shakes/ rash  . Penicillins Rash    Current Outpatient Prescriptions on File Prior to Visit  Medication Sig  . albuterol (PROVENTIL) (2.5 MG/3ML) 0.083% nebulizer solution Take 3 mLs (2.5 mg total) by nebulization every 6 (six) hours as needed for wheezing or shortness of  breath.  . ALPRAZolam (XANAX) 1 MG tablet Take 1 tablet (1 mg total) by mouth 3 (three) times daily as needed.  . furosemide (LASIX) 40 MG tablet Take 0.5 tablets (20 mg total) by mouth daily.  Marland Kitchen glimepiride (AMARYL) 4 MG tablet Take 1 tablet (4 mg total) by mouth 2 (two) times daily before lunch and supper.  Marland Kitchen lisinopril (PRINIVIL,ZESTRIL) 40 MG tablet Take 1 tablet (40 mg total) by mouth daily.  . Multiple Vitamins-Minerals  (STRESS TAB NF PO) Take by mouth.  . nitrofurantoin, macrocrystal-monohydrate, (MACROBID) 100 MG capsule Take 1 capsule (100 mg total) by mouth 2 (two) times daily.  Marland Kitchen OVER THE COUNTER MEDICATION percogesic 4 tabs daily for pain 325 mg/ 12.5 mg  . varenicline (CHANTIX CONTINUING MONTH PAK) 1 MG tablet Take 1 tablet (1 mg total) by mouth 2 (two) times daily.   Current Facility-Administered Medications on File Prior to Visit  Medication  . ipratropium-albuterol (DUONEB) 0.5-2.5 (3) MG/3ML nebulizer solution 3 mL    ROS: all negative except above.   Physical Exam: Filed Weights   09/15/16 1352  Weight: 140 lb 3.2 oz (63.6 kg)   BP 130/80   Pulse 96   Temp 97.7 F (36.5 C)   Resp 14   Ht 4' 11"  (1.499 m)   Wt 140 lb 3.2 oz (63.6 kg)   SpO2 99%   BMI 28.32 kg/m  General appearance: alert, no distress, WD/WN, female HEENT: normocephalic, sclerae anicteric, TMs pearly, nares patent, no discharge or erythema, pharynx normal Oral cavity: MMM, no lesions Neck: supple, no lymphadenopathy, no thyromegaly, no masses Heart: RRR, normal S1, S2, no murmurs Lungs: decreased BS, no wheezing, no rhochi, no rales Abdomen: +bs, soft, obese, tender epigastric and RUQ pain to palpation, + distended, no masses, no hepatomegaly, no splenomegaly Musculoskeletal: nontender, no swelling, no obvious deformity Extremities: no edema, no cyanosis, no clubbing Pulses: 2+ symmetric, upper and lower extremities, normal cap refill Neurological: alert, oriented x 3, CN2-12 intact, strength normal upper extremities and lower extremities, sensation normal throughout, DTRs 2+ throughout, no cerebellar signs, gait antalgic Psychiatric: normal affect, behavior normal, pleasant     Vicie Mutters, PA-C 2:22 PM [facility]

## 2016-09-15 NOTE — Patient Instructions (Addendum)
Cholecystitis Introduction Cholecystitis is swelling and irritation (inflammation) of the gallbladder. The gallbladder is an organ that is shaped like a pear. It is under the liver on the right side of the body. This condition is often caused by gallstones. You doctor may do tests to see how your gallbladder works. These tests may include:  Imaging tests, such as:  An ultrasound.  MRI.  Tests that check how your liver works. This condition needs treatment. Follow these instructions at home: Home care will depend on your treatment. In general:  Take over-the-counter and prescription medicines only as told by your doctor.  If you were prescribed an antibiotic medicine, take it as told by your doctor. Do not stop taking the antibiotic even if you start to feel better.  Follow instructions from your doctor about what to eat or drink. When you are allowed to eat, avoid eating or drinking anything that causes your symptoms to start.  Keep all follow-up visits as told by your doctor. This is important. Contact a doctor if:  You have pain and your medicine does not help.  You have a fever. Get help right away if:  Your pain moves to:  Another part of your belly (abdomen).  Your back.  Your symptoms do not go away.  You have new symptoms. This information is not intended to replace advice given to you by your health care provider. Make sure you discuss any questions you have with your health care provider. Document Released: 07/02/2011 Document Revised: 12/19/2015 Document Reviewed: 10/24/2014  2017 Elsevier  If you have a smart phone, please look up Smoke Free app, this will help you stay on track and give you information about money you have saved, life that you have gained back and a ton of more information.   We are giving you chantix for smoking cessation. You can do it! And we are here to help! You may have heard some scary side effects about chantix, the three most common  I hear about are nausea, crazy dreams and depression.  However, I like for my patients to try to stay on 1/2 a tablet twice a day rather than one tablet twice a day as normally prescribed. This helps decrease the chances of side effects and helps save money by making a one month prescription last two months  Please start the prescription this way:  Start 1/2 tablet by mouth once daily after food with a full glass of water for 3 days Then do 1/2 tablet by mouth twice daily for 4 days. During this first week you can smoke, but try to stop after this week.  At this point we have several options: 1) continue on 1/2 tablet twice a day- which I encourage you to do. You can stay on this dose the rest of the time on the medication or if you still feel the need to smoke you can do one of the two options below. 2) do one tablet in the morning and 1/2 in the evening which helps decrease dreams. 3) do one tablet twice a day.   What if I miss a dose? If you miss a dose, take it as soon as you can. If it is almost time for your next dose, take only that dose. Do not take double or extra doses.  What should I watch for while using this medicine? Visit your doctor or health care professional for regular check ups. Ask for ongoing advice and encouragement from your doctor or healthcare  professional, friends, and family to help you quit. If you smoke while on this medication, quit again  Your mouth may get dry. Chewing sugarless gum or hard candy, and drinking plenty of water may help. Contact your doctor if the problem does not go away or is severe.  You may get drowsy or dizzy. Do not drive, use machinery, or do anything that needs mental alertness until you know how this medicine affects you. Do not stand or sit up quickly, especially if you are an older patient.   The use of this medicine may increase the chance of suicidal thoughts or actions. Pay special attention to how you are responding while on this  medicine. Any worsening of mood, or thoughts of suicide or dying should be reported to your health care professional right away.  ADVANTAGES OF QUITTING SMOKING  Within 20 minutes, blood pressure decreases. Your pulse is at normal level.  After 8 hours, carbon monoxide levels in the blood return to normal. Your oxygen level increases.  After 24 hours, the chance of having a heart attack starts to decrease. Your breath, hair, and body stop smelling like smoke.  After 48 hours, damaged nerve endings begin to recover. Your sense of taste and smell improve.  After 72 hours, the body is virtually free of nicotine. Your bronchial tubes relax and breathing becomes easier.  After 2 to 12 weeks, lungs can hold more air. Exercise becomes easier and circulation improves.  After 1 year, the risk of coronary heart disease is cut in half.  After 5 years, the risk of stroke falls to the same as a nonsmoker.  After 10 years, the risk of lung cancer is cut in half and the risk of other cancers decreases significantly.  After 15 years, the risk of coronary heart disease drops, usually to the level of a nonsmoker.  You will have extra money to spend on things other than cigarettes.

## 2016-09-16 LAB — URINALYSIS, MICROSCOPIC ONLY
CASTS: NONE SEEN [LPF]
CRYSTALS: NONE SEEN [HPF]
RBC / HPF: NONE SEEN RBC/HPF (ref <=2)
YEAST: NONE SEEN [HPF]

## 2016-09-16 LAB — URINALYSIS, ROUTINE W REFLEX MICROSCOPIC
Bilirubin Urine: NEGATIVE
Ketones, ur: NEGATIVE
NITRITE: NEGATIVE
PH: 5.5 (ref 5.0–8.0)
Specific Gravity, Urine: 1.006 (ref 1.001–1.035)

## 2016-09-16 NOTE — Progress Notes (Signed)
LVM for pt to return office call for LAB results.

## 2016-09-16 NOTE — Progress Notes (Signed)
Pt aware of lab results & voiced understanding of those results.

## 2016-09-18 LAB — URINE CULTURE

## 2016-09-19 ENCOUNTER — Other Ambulatory Visit: Payer: Self-pay | Admitting: Physician Assistant

## 2016-09-19 MED ORDER — CEFUROXIME AXETIL 250 MG PO TABS: 250.0000 mg | ORAL_TABLET | Freq: Two times a day (BID) | ORAL | 0 refills | Status: DC

## 2016-09-24 ENCOUNTER — Other Ambulatory Visit: Payer: Self-pay | Admitting: Physician Assistant

## 2016-09-24 ENCOUNTER — Encounter (HOSPITAL_BASED_OUTPATIENT_CLINIC_OR_DEPARTMENT_OTHER): Payer: Self-pay | Admitting: *Deleted

## 2016-09-24 NOTE — Progress Notes (Signed)
Will take ceftin

## 2016-09-24 NOTE — Progress Notes (Signed)
NPO AFTER MN.  ARRIVE AT 0700.  CURRENT CHEST CT, EKG, AND LAB RESULTS IN CHART AND EPIC.  WILL DO SYMBICORT INHALER AM DOS W/ SIPS OF WATER (ALSO WILL DO NEBULIZER HS BEFORE SURGERY).  WILL DO HIBICLENS SHOWER HS BEFORE AND AM DOS.  SENT IB MESSAGE VIA EPIC TO DR Kieth Brightly TO CONFIRM OK TO USE LAB RESULTS DONE AT PCP.

## 2016-09-30 ENCOUNTER — Encounter (HOSPITAL_BASED_OUTPATIENT_CLINIC_OR_DEPARTMENT_OTHER)
Admission: RE | Disposition: A | Discharge status: Home / Self Care | Payer: Self-pay | Source: Ambulatory Visit | Attending: General Surgery

## 2016-09-30 ENCOUNTER — Ambulatory Visit (HOSPITAL_BASED_OUTPATIENT_CLINIC_OR_DEPARTMENT_OTHER)
Admission: RE | Admit: 2016-09-30 | Discharge: 2016-09-30 | Disposition: A | Discharge status: Home / Self Care | Payer: PPO | Source: Ambulatory Visit | Attending: General Surgery | Admitting: General Surgery

## 2016-09-30 ENCOUNTER — Encounter (HOSPITAL_BASED_OUTPATIENT_CLINIC_OR_DEPARTMENT_OTHER): Payer: Self-pay | Admitting: *Deleted

## 2016-09-30 ENCOUNTER — Ambulatory Visit (HOSPITAL_BASED_OUTPATIENT_CLINIC_OR_DEPARTMENT_OTHER): Payer: PPO | Admitting: Anesthesiology

## 2016-09-30 DIAGNOSIS — F419 Anxiety disorder, unspecified: Secondary | ICD-10-CM | POA: Diagnosis not present

## 2016-09-30 DIAGNOSIS — I10 Essential (primary) hypertension: Secondary | ICD-10-CM | POA: Diagnosis not present

## 2016-09-30 DIAGNOSIS — M170 Bilateral primary osteoarthritis of knee: Secondary | ICD-10-CM | POA: Insufficient documentation

## 2016-09-30 DIAGNOSIS — Z7951 Long term (current) use of inhaled steroids: Secondary | ICD-10-CM | POA: Diagnosis not present

## 2016-09-30 DIAGNOSIS — Z7984 Long term (current) use of oral hypoglycemic drugs: Secondary | ICD-10-CM | POA: Insufficient documentation

## 2016-09-30 DIAGNOSIS — M160 Bilateral primary osteoarthritis of hip: Secondary | ICD-10-CM | POA: Diagnosis not present

## 2016-09-30 DIAGNOSIS — K8010 Calculus of gallbladder with chronic cholecystitis without obstruction: Secondary | ICD-10-CM | POA: Diagnosis not present

## 2016-09-30 DIAGNOSIS — Z79899 Other long term (current) drug therapy: Secondary | ICD-10-CM | POA: Diagnosis not present

## 2016-09-30 DIAGNOSIS — Z8673 Personal history of transient ischemic attack (TIA), and cerebral infarction without residual deficits: Secondary | ICD-10-CM | POA: Diagnosis not present

## 2016-09-30 DIAGNOSIS — F17210 Nicotine dependence, cigarettes, uncomplicated: Secondary | ICD-10-CM | POA: Insufficient documentation

## 2016-09-30 DIAGNOSIS — E785 Hyperlipidemia, unspecified: Secondary | ICD-10-CM | POA: Diagnosis not present

## 2016-09-30 DIAGNOSIS — M19079 Primary osteoarthritis, unspecified ankle and foot: Secondary | ICD-10-CM | POA: Insufficient documentation

## 2016-09-30 DIAGNOSIS — M810 Age-related osteoporosis without current pathological fracture: Secondary | ICD-10-CM | POA: Diagnosis not present

## 2016-09-30 DIAGNOSIS — Z880 Allergy status to penicillin: Secondary | ICD-10-CM | POA: Diagnosis not present

## 2016-09-30 DIAGNOSIS — F329 Major depressive disorder, single episode, unspecified: Secondary | ICD-10-CM | POA: Diagnosis not present

## 2016-09-30 DIAGNOSIS — J449 Chronic obstructive pulmonary disease, unspecified: Secondary | ICD-10-CM | POA: Diagnosis not present

## 2016-09-30 HISTORY — DX: Other nonspecific abnormal finding of lung field: R91.8

## 2016-09-30 HISTORY — PX: CHOLECYSTECTOMY: SHX55

## 2016-09-30 HISTORY — DX: Chronic obstructive pulmonary disease, unspecified: J44.9

## 2016-09-30 HISTORY — DX: Personal history of (healed) traumatic fracture: Z87.81

## 2016-09-30 HISTORY — DX: Personal history of transient ischemic attack (TIA), and cerebral infarction without residual deficits: Z86.73

## 2016-09-30 HISTORY — DX: Unspecified osteoarthritis, unspecified site: M19.90

## 2016-09-30 HISTORY — DX: Urinary tract infection, site not specified: N39.0

## 2016-09-30 HISTORY — DX: Presence of dental prosthetic device (complete) (partial): Z97.2

## 2016-09-30 HISTORY — DX: Depression, unspecified: F32.A

## 2016-09-30 HISTORY — DX: Major depressive disorder, single episode, unspecified: F32.9

## 2016-09-30 HISTORY — DX: Type 2 diabetes mellitus without complications: E11.9

## 2016-09-30 HISTORY — DX: Anxiety disorder, unspecified: F41.9

## 2016-09-30 LAB — POCT I-STAT, CHEM 8
BUN: 15 mg/dL (ref 6–20)
CREATININE: 0.6 mg/dL (ref 0.44–1.00)
Calcium, Ion: 1.22 mmol/L (ref 1.15–1.40)
Chloride: 92 mmol/L — ABNORMAL LOW (ref 101–111)
Glucose, Bld: 337 mg/dL — ABNORMAL HIGH (ref 65–99)
HEMATOCRIT: 47 % — AB (ref 36.0–46.0)
HEMOGLOBIN: 16 g/dL — AB (ref 12.0–15.0)
Potassium: 4.7 mmol/L (ref 3.5–5.1)
Sodium: 132 mmol/L — ABNORMAL LOW (ref 135–145)
TCO2: 30 mmol/L (ref 0–100)

## 2016-09-30 LAB — GLUCOSE, CAPILLARY: GLUCOSE-CAPILLARY: 294 mg/dL — AB (ref 65–99)

## 2016-09-30 SURGERY — LAPAROSCOPIC CHOLECYSTECTOMY
Anesthesia: General | Site: Abdomen

## 2016-09-30 MED ORDER — BUPIVACAINE HCL 0.25 % IJ SOLN
INTRAMUSCULAR | Status: DC | PRN
  Administered 2016-09-30: 20 mL

## 2016-09-30 MED ORDER — CELECOXIB 200 MG PO CAPS
ORAL_CAPSULE | ORAL | Status: AC
  Filled 2016-09-30: qty 2

## 2016-09-30 MED ORDER — SODIUM CHLORIDE 0.9 % IR SOLN
Status: DC | PRN
  Administered 2016-09-30: 1000 mL

## 2016-09-30 MED ORDER — SUGAMMADEX SODIUM 200 MG/2ML IV SOLN
INTRAVENOUS | Status: AC
  Filled 2016-09-30: qty 2

## 2016-09-30 MED ORDER — INSULIN ASPART 100 UNIT/ML ~~LOC~~ SOLN
SUBCUTANEOUS | Status: AC
  Filled 2016-09-30: qty 1

## 2016-09-30 MED ORDER — HYDROCODONE-ACETAMINOPHEN 5-325 MG PO TABS: 1.0000 | ORAL_TABLET | Freq: Four times a day (QID) | ORAL | 0 refills | Status: DC | PRN

## 2016-09-30 MED ORDER — KETOROLAC TROMETHAMINE 30 MG/ML IJ SOLN
30.0000 mg | Freq: Once | INTRAMUSCULAR | Status: DC | PRN
  Filled 2016-09-30: qty 1

## 2016-09-30 MED ORDER — CHLORHEXIDINE GLUCONATE CLOTH 2 % EX PADS
6.0000 | MEDICATED_PAD | Freq: Once | CUTANEOUS | Status: DC
  Filled 2016-09-30: qty 6

## 2016-09-30 MED ORDER — DEXAMETHASONE SODIUM PHOSPHATE 10 MG/ML IJ SOLN
INTRAMUSCULAR | Status: AC
  Filled 2016-09-30: qty 1

## 2016-09-30 MED ORDER — PROMETHAZINE HCL 25 MG/ML IJ SOLN
6.2500 mg | INTRAMUSCULAR | Status: DC | PRN
  Filled 2016-09-30: qty 1

## 2016-09-30 MED ORDER — SUGAMMADEX SODIUM 200 MG/2ML IV SOLN
INTRAVENOUS | Status: DC | PRN
  Administered 2016-09-30: 125 mg via INTRAVENOUS

## 2016-09-30 MED ORDER — CIPROFLOXACIN IN D5W 400 MG/200ML IV SOLN
INTRAVENOUS | Status: AC
  Filled 2016-09-30: qty 200

## 2016-09-30 MED ORDER — ACETAMINOPHEN 500 MG PO TABS
ORAL_TABLET | ORAL | Status: AC
  Filled 2016-09-30: qty 2

## 2016-09-30 MED ORDER — HYDROMORPHONE HCL 1 MG/ML IJ SOLN
0.2500 mg | INTRAMUSCULAR | Status: DC | PRN
  Administered 2016-09-30: 0.25 mg via INTRAVENOUS
  Filled 2016-09-30: qty 0.5

## 2016-09-30 MED ORDER — PROPOFOL 10 MG/ML IV BOLUS
INTRAVENOUS | Status: DC | PRN
  Administered 2016-09-30: 150 mg via INTRAVENOUS

## 2016-09-30 MED ORDER — LIDOCAINE 2% (20 MG/ML) 5 ML SYRINGE
INTRAMUSCULAR | Status: AC
  Filled 2016-09-30: qty 5

## 2016-09-30 MED ORDER — LABETALOL HCL 5 MG/ML IV SOLN
INTRAVENOUS | Status: DC | PRN
  Administered 2016-09-30: 5 mg via INTRAVENOUS

## 2016-09-30 MED ORDER — LIDOCAINE 2% (20 MG/ML) 5 ML SYRINGE
INTRAMUSCULAR | Status: DC | PRN
  Administered 2016-09-30: 50 mg via INTRAVENOUS

## 2016-09-30 MED ORDER — INSULIN ASPART 100 UNIT/ML ~~LOC~~ SOLN
SUBCUTANEOUS | Status: DC | PRN
  Administered 2016-09-30: 10 [IU] via SUBCUTANEOUS

## 2016-09-30 MED ORDER — FENTANYL CITRATE (PF) 100 MCG/2ML IJ SOLN
INTRAMUSCULAR | Status: AC
Start: 2016-09-30 — End: 2016-09-30
  Filled 2016-09-30: qty 2

## 2016-09-30 MED ORDER — ONDANSETRON HCL 4 MG/2ML IJ SOLN
INTRAMUSCULAR | Status: DC | PRN
  Administered 2016-09-30: 4 mg via INTRAVENOUS

## 2016-09-30 MED ORDER — ALBUTEROL SULFATE HFA 108 (90 BASE) MCG/ACT IN AERS
INHALATION_SPRAY | RESPIRATORY_TRACT | Status: DC | PRN
  Administered 2016-09-30: 4 via RESPIRATORY_TRACT

## 2016-09-30 MED ORDER — HYDROMORPHONE HCL 2 MG/ML IJ SOLN
INTRAMUSCULAR | Status: AC
  Filled 2016-09-30: qty 1

## 2016-09-30 MED ORDER — ALBUTEROL SULFATE HFA 108 (90 BASE) MCG/ACT IN AERS
INHALATION_SPRAY | RESPIRATORY_TRACT | Status: AC
  Filled 2016-09-30: qty 6.7

## 2016-09-30 MED ORDER — DEXAMETHASONE SODIUM PHOSPHATE 4 MG/ML IJ SOLN
INTRAMUSCULAR | Status: DC | PRN
  Administered 2016-09-30: 10 mg via INTRAVENOUS

## 2016-09-30 MED ORDER — GABAPENTIN 300 MG PO CAPS
300.0000 mg | ORAL_CAPSULE | ORAL | Status: AC
  Administered 2016-09-30: 300 mg via ORAL
  Filled 2016-09-30: qty 1

## 2016-09-30 MED ORDER — PROPOFOL 10 MG/ML IV BOLUS
INTRAVENOUS | Status: AC
  Filled 2016-09-30: qty 20

## 2016-09-30 MED ORDER — MIDAZOLAM HCL 5 MG/5ML IJ SOLN
INTRAMUSCULAR | Status: DC | PRN
  Administered 2016-09-30: 2 mg via INTRAVENOUS

## 2016-09-30 MED ORDER — GABAPENTIN 300 MG PO CAPS
ORAL_CAPSULE | ORAL | Status: AC
  Filled 2016-09-30: qty 1

## 2016-09-30 MED ORDER — LABETALOL HCL 5 MG/ML IV SOLN
INTRAVENOUS | Status: AC
  Filled 2016-09-30: qty 4

## 2016-09-30 MED ORDER — FENTANYL CITRATE (PF) 100 MCG/2ML IJ SOLN
INTRAMUSCULAR | Status: DC | PRN
  Administered 2016-09-30: 50 ug via INTRAVENOUS
  Administered 2016-09-30 (×2): 25 ug via INTRAVENOUS

## 2016-09-30 MED ORDER — ROCURONIUM BROMIDE 100 MG/10ML IV SOLN
INTRAVENOUS | Status: DC | PRN
  Administered 2016-09-30: 30 mg via INTRAVENOUS

## 2016-09-30 MED ORDER — ROCURONIUM BROMIDE 50 MG/5ML IV SOSY
PREFILLED_SYRINGE | INTRAVENOUS | Status: AC
  Filled 2016-09-30: qty 5

## 2016-09-30 MED ORDER — CIPROFLOXACIN IN D5W 400 MG/200ML IV SOLN
400.0000 mg | INTRAVENOUS | Status: AC
  Administered 2016-09-30: 400 mg via INTRAVENOUS
  Filled 2016-09-30: qty 200

## 2016-09-30 MED ORDER — MIDAZOLAM HCL 2 MG/2ML IJ SOLN
INTRAMUSCULAR | Status: AC
  Filled 2016-09-30: qty 2

## 2016-09-30 MED ORDER — ACETAMINOPHEN 500 MG PO TABS
1000.0000 mg | ORAL_TABLET | ORAL | Status: AC
  Administered 2016-09-30: 1000 mg via ORAL
  Filled 2016-09-30: qty 2

## 2016-09-30 MED ORDER — LACTATED RINGERS IV SOLN
INTRAVENOUS | Status: DC
  Administered 2016-09-30: 07:00:00 via INTRAVENOUS
  Filled 2016-09-30: qty 1000

## 2016-09-30 MED ORDER — CELECOXIB 400 MG PO CAPS
400.0000 mg | ORAL_CAPSULE | ORAL | Status: AC
  Administered 2016-09-30: 400 mg via ORAL
  Filled 2016-09-30: qty 1

## 2016-09-30 MED ORDER — ONDANSETRON HCL 4 MG/2ML IJ SOLN
INTRAMUSCULAR | Status: AC
  Filled 2016-09-30: qty 2

## 2016-09-30 SURGICAL SUPPLY — 52 items
APPLIER CLIP ROT 10 11.4 M/L (STAPLE)
BLADE SURG 11 STRL SS (BLADE) ×3 IMPLANT
CABLE HIGH FREQUENCY MONO STRZ (ELECTRODE) ×3 IMPLANT
CATH CHOLANG 76X19 KUMAR (CATHETERS) IMPLANT
CHLORAPREP W/TINT 26ML (MISCELLANEOUS) ×3 IMPLANT
CLIP APPLIE ROT 10 11.4 M/L (STAPLE) IMPLANT
CLIP LIGATING HEM O LOK PURPLE (MISCELLANEOUS) ×3 IMPLANT
CLIP LIGATING HEMO LOK XL GOLD (MISCELLANEOUS) IMPLANT
COVER BACK TABLE 60X90IN (DRAPES) ×3 IMPLANT
COVER MAYO STAND STRL (DRAPES) ×3 IMPLANT
DECANTER SPIKE VIAL GLASS SM (MISCELLANEOUS) ×3 IMPLANT
DERMABOND ADVANCED (GAUZE/BANDAGES/DRESSINGS) ×2
DERMABOND ADVANCED .7 DNX12 (GAUZE/BANDAGES/DRESSINGS) ×1 IMPLANT
DEVICE PMI PUNCTURE CLOSURE (MISCELLANEOUS) IMPLANT
DRAIN CHANNEL 19F RND (DRAIN) IMPLANT
DRAPE C-ARM 42X72 X-RAY (DRAPES) IMPLANT
DRAPE LAPAROSCOPIC ABDOMINAL (DRAPES) ×3 IMPLANT
DRAPE UTILITY XL STRL (DRAPES) ×3 IMPLANT
ELECT REM PT RETURN 9FT ADLT (ELECTROSURGICAL) ×3
ELECTRODE REM PT RTRN 9FT ADLT (ELECTROSURGICAL) ×1 IMPLANT
EVACUATOR SILICONE 100CC (DRAIN) IMPLANT
GLOVE BIOGEL PI IND STRL 7.0 (GLOVE) ×2 IMPLANT
GLOVE BIOGEL PI IND STRL 7.5 (GLOVE) ×3 IMPLANT
GLOVE BIOGEL PI INDICATOR 7.0 (GLOVE) ×4
GLOVE BIOGEL PI INDICATOR 7.5 (GLOVE) ×6
GLOVE ECLIPSE 7.0 STRL STRAW (GLOVE) ×3 IMPLANT
GLOVE SURG SS PI 7.0 STRL IVOR (GLOVE) ×3 IMPLANT
GOWN STRL REUS W/TWL LRG LVL3 (GOWN DISPOSABLE) ×6 IMPLANT
GOWN STRL REUS W/TWL XL LVL3 (GOWN DISPOSABLE) ×3 IMPLANT
HEMOSTAT SNOW SURGICEL 2X4 (HEMOSTASIS) IMPLANT
IV NS 1000ML (IV SOLUTION) ×2
IV NS 1000ML BAXH (IV SOLUTION) ×1 IMPLANT
NEEDLE HYPO 22GX1.5 SAFETY (NEEDLE) ×3 IMPLANT
NS IRRIG 500ML POUR BTL (IV SOLUTION) ×3 IMPLANT
PACK BASIN DAY SURGERY FS (CUSTOM PROCEDURE TRAY) ×3 IMPLANT
POUCH RETRIEVAL ECOSAC 10 (ENDOMECHANICALS) ×1 IMPLANT
POUCH RETRIEVAL ECOSAC 10MM (ENDOMECHANICALS) ×2
SCISSORS LAP 5X35 DISP (ENDOMECHANICALS) IMPLANT
SET IRRIG TUBING LAPAROSCOPIC (IRRIGATION / IRRIGATOR) IMPLANT
SHEARS HARMONIC ACE PLUS 36CM (ENDOMECHANICALS) IMPLANT
SOLUTION ANTI FOG 6CC (MISCELLANEOUS) ×3 IMPLANT
STOPCOCK 4 WAY LG BORE MALE ST (IV SETS) IMPLANT
SUT ETHILON 2 0 PS N (SUTURE) IMPLANT
SUT MNCRL AB 4-0 PS2 18 (SUTURE) ×3 IMPLANT
SUT VICRYL 0 ENDOLOOP (SUTURE) IMPLANT
SUT VICRYL 0 UR6 27IN ABS (SUTURE) IMPLANT
SYR 20CC LL (SYRINGE) IMPLANT
SYR CONTROL 10ML LL (SYRINGE) ×3 IMPLANT
TOWEL OR 17X24 6PK STRL BLUE (TOWEL DISPOSABLE) ×6 IMPLANT
TROCAR BLADELESS OPT 5 100 (ENDOMECHANICALS) ×9 IMPLANT
TROCAR XCEL 12X100 BLDLESS (ENDOMECHANICALS) ×3 IMPLANT
TUBING INSUF HEATED (TUBING) ×3 IMPLANT

## 2016-09-30 NOTE — H&P (Signed)
Sara Lamb is an 66 y.o. female.   Chief Complaint: abdominal pain HPI: 66 yo female with intermittent epigastric pain. She was found to have large gallstones and presents today for cholecystectomy  Past Medical History:  Diagnosis Date  . Anxiety disorder   . COPD (chronic obstructive pulmonary disease) ([place])    followed by pcp--  last exacerbation 11/ 2017  . Depression   . History of rib fracture    2004  . 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 Hyperlipidemia   . Hypertension   . OA (osteoarthritis)    back, hips, knees, feet  . Pulmonary nodules    per CT chest 08-28-2016  -- bilateral upper lobe nodules  . Type 2 diabetes mellitus ([place])    followed by pcp--  last A1c 9.1 on 08-01-2016  . UTI (urinary tract infection)   . Wears dentures     Past Surgical History:  Procedure Laterality Date  . BILATERAL SALPINGOOPHORECTOMY  1997   via Laparoscopy  . KNEE ARTHROSCOPY Right 11/2014  . VAGINAL HYSTERECTOMY  age 58    Family History  Problem Relation Age of Onset  . Cancer Father     PROSTATE  . Diabetes Father   . Hypertension Father   . Hyperlipidemia Sister   . Heart disease Brother   . Hyperlipidemia Brother   . Hypertension Brother   . Diabetes Brother   . Heart disease Sister   . Hyperlipidemia Sister   . Hypertension Sister   . Diabetes Mother   . Stroke Mother    Social History:  reports that she has been smoking Cigarettes.  She has a 17.50 pack-year smoking history. She has never used smokeless tobacco. She reports that she does not drink alcohol or use drugs.  Allergies:  Allergies  Allergen Reactions  . Metformin And Related     "metformin caused mini-strokes" and ELEVATED BS and also GLYBURIDE, ONGLYZA, ACTOS, INVOKANA caused elevated BS  .  Insulins Rash  . Janumet [Sitagliptin-Metformin Hcl] Rash    Shakes/ rash  . Penicillins Rash    Medications Prior to Admission  Medication Sig Dispense Refill  . albuterol (PROVENTIL) (2.5 MG/3ML) 0.083% nebulizer solution Take 3 mLs (2.5 mg total) by nebulization every 6 (six) hours as needed for wheezing or shortness of breath. 75 mL 12  . ALPRAZolam (XANAX) 1 MG tablet Take 1 tablet (1 mg total) by mouth 3 (three) times daily as needed. 90 tablet 2  . aspirin-acetaminophen-caffeine (EXCEDRIN MIGRAINE) 578-469-62 MG tablet Take by mouth every 6 (six) hours as needed for headache.    . budesonide-formoterol (SYMBICORT) 160-4.5 MCG/ACT inhaler Inhale 2 puffs into the lungs 2 (two) times daily. 1 Inhaler 3  . cefUROXime (CEFTIN) 250 MG tablet Take 1 tablet (250 mg total) by mouth 2 (two) times daily. (Patient taking differently: Take 250 mg by mouth 2 (two) times daily. Pt did not get filled due to allergy-- pt to call her pcp to get another re-ordered) 14 tablet 0  . colesevelam (WELCHOL) 625 MG tablet Take by mouth every morning.    . DiphenhydrAMINE HCl, Sleep, (SLEEP AID) 50 MG CAPS Take 1 capsule by mouth at bedtime.    . Diphenhydramine-APAP (PERCOGESIC) 12.5-325 MG TABS  Take by mouth as needed.    . furosemide (LASIX) 40 MG tablet Take 0.5 tablets (20 mg total) by mouth daily. (Patient taking differently: Take 20 mg by mouth as needed. ) 30 tablet 11  . glimepiride (AMARYL) 4 MG tablet Take 1 tablet (4 mg total) by mouth 2 (two) times daily before lunch and supper. 180 tablet 1  . lisinopril (PRINIVIL,ZESTRIL) 40 MG tablet Take 1 tablet (40 mg total) by mouth daily. (Patient taking differently: Take 40 mg by mouth every morning. ) 90 tablet 3  . Nicotine (NICODERM CQ TD) Place onto the skin as needed.    . nicotine polacrilex (COMMIT) 2 MG lozenge Take 2 mg by mouth as needed for smoking cessation.    Marland Kitchen omeprazole (PRILOSEC) 20 MG capsule Take 20 mg by mouth every evening.    .  varenicline (CHANTIX CONTINUING MONTH PAK) 1 MG tablet Take 1 tablet (1 mg total) by mouth 2 (two) times daily. (Patient taking differently: Take 0.5 mg by mouth 2 (two) times daily. ) 56 tablet 2    Results for orders placed or performed during the hospital encounter of 09/30/16 (from the past 48 hour(s))  I-STAT, chem 8     Status: Abnormal   Collection Time: 09/30/16  7:37 AM  Result Value Ref Range   Sodium 132 (L) 135 - 145 mmol/L   Potassium 4.7 3.5 - 5.1 mmol/L   Chloride 92 (L) 101 - 111 mmol/L   BUN 15 6 - 20 mg/dL   Creatinine, Ser 0.60 0.44 - 1.00 mg/dL   Glucose, Bld 337 (H) 65 - 99 mg/dL   Calcium, Ion 1.22 1.15 - 1.40 mmol/L   TCO2 30 0 - 100 mmol/L   Hemoglobin 16.0 (H) 12.0 - 15.0 g/dL   HCT 47.0 (H) 36.0 - 46.0 %   No results found.  Review of Systems  Constitutional: Negative for chills and fever.  HENT: Negative for hearing loss.   Eyes: Negative for blurred vision and double vision.  Respiratory: Negative for cough and hemoptysis.   Cardiovascular: Negative for chest pain and palpitations.  Gastrointestinal: Positive for abdominal pain and nausea. Negative for vomiting.  Genitourinary: Negative for dysuria and urgency.  Musculoskeletal: Negative for myalgias and neck pain.  Skin: Negative for itching and rash.  Neurological: Negative for dizziness, tingling and headaches.  Endo/Heme/Allergies: Does not bruise/bleed easily.  Psychiatric/Behavioral: Negative for depression and suicidal ideas.    Blood pressure 117/65, pulse 97, temperature 98.8 F (37.1 C), temperature source Oral, resp. rate 16, height 4' 11"  (1.499 m), weight 62.4 kg (137 lb 8 oz), SpO2 100 %. Physical Exam  Vitals reviewed. Constitutional: She is oriented to person, place, and time. She appears well-developed and well-nourished.  HENT:  Head: Normocephalic and atraumatic.  Eyes: Conjunctivae and EOM are normal. Pupils are equal, round, and reactive to light.  Neck: Normal range of  motion. Neck supple.  Cardiovascular: Normal rate and regular rhythm.   Respiratory: Effort normal and breath sounds normal.  GI: Soft. Bowel sounds are normal. She exhibits no distension. There is no tenderness.  Musculoskeletal: Normal range of motion.  Neurological: She is alert and oriented to person, place, and time.  Skin: Skin is warm and dry.  Psychiatric: She has a normal mood and affect. Her behavior is normal.     Assessment/Plan 66 yo female with chronic cholecystitis -lap chole as outpatient  Mickeal Skinner, MD 09/30/2016, 8:21 AM

## 2016-09-30 NOTE — Anesthesia Procedure Notes (Signed)
Procedure Name: Intubation Date/Time: 09/30/2016 8:38 AM Performed by: [name] T Pre-anesthesia Checklist: Patient identified, Emergency Drugs available, Suction available and Patient being monitored Patient Re-evaluated:Patient Re-evaluated prior to inductionOxygen Delivery Method: Circle system utilized Preoxygenation: Pre-oxygenation with 100% oxygen Intubation Type: IV induction Ventilation: Mask ventilation without difficulty Laryngoscope Size: Mac and 3 Grade View: Grade I Tube type: Oral Tube size: 7.0 mm Number of attempts: 1 Airway Equipment and Method: Stylet and Oral airway Placement Confirmation: ETT inserted through vocal cords under direct vision,  positive ETCO2 and breath sounds checked- equal and bilateral Secured at: 21 cm Tube secured with: Tape Dental Injury: Teeth and Oropharynx as per pre-operative assessment

## 2016-09-30 NOTE — Anesthesia Preprocedure Evaluation (Signed)
Anesthesia Evaluation  Patient identified by MRN, date of birth, ID band Patient awake    Reviewed: Allergy & Precautions, NPO status , Patient's Chart, lab work & pertinent test results  Airway Mallampati: II  TM Distance: >3 FB Neck ROM: Full    Dental no notable dental hx.    Pulmonary COPD, Current Smoker,    Pulmonary exam normal breath sounds clear to auscultation       Cardiovascular hypertension, + Peripheral Vascular Disease  Normal cardiovascular exam Rhythm:Regular Rate:Normal     Neuro/Psych Anxiety negative neurological ROS     GI/Hepatic negative GI ROS, Neg liver ROS,   Endo/Other  negative endocrine ROSdiabetes  Renal/GU negative Renal ROS  negative genitourinary   Musculoskeletal negative musculoskeletal ROS (+)   Abdominal   Peds negative pediatric ROS (+)  Hematology negative hematology ROS (+)   Anesthesia Other Findings   Reproductive/Obstetrics negative OB ROS                             Anesthesia Physical Anesthesia Plan  ASA: III  Anesthesia Plan: General   Post-op Pain Management:    Induction: Intravenous  Airway Management Planned: Oral ETT  Additional Equipment:   Intra-op Plan:   Post-operative Plan: Extubation in OR  Informed Consent: I have reviewed the patients History and Physical, chart, labs and discussed the procedure including the risks, benefits and alternatives for the proposed anesthesia with the patient or authorized representative who has indicated his/her understanding and acceptance.   Dental advisory given  Plan Discussed with: CRNA and Surgeon  Anesthesia Plan Comments:         Anesthesia Quick Evaluation

## 2016-09-30 NOTE — Anesthesia Postprocedure Evaluation (Addendum)
Anesthesia Post Note  Patient: Sara Lamb  Procedure(s) Performed: Procedure(s) (LRB): LAPAROSCOPIC CHOLECYSTECTOMY (N/A)  Patient location during evaluation: PACU Anesthesia Type: General Level of consciousness: awake and alert Pain management: pain level controlled Vital Signs Assessment: post-procedure vital signs reviewed and stable Respiratory status: spontaneous breathing, nonlabored ventilation, respiratory function stable and patient connected to nasal cannula oxygen Cardiovascular status: blood pressure returned to baseline and stable Postop Assessment: no signs of nausea or vomiting Anesthetic complications: no       Last Vitals:  Vitals:   09/30/16 1000 09/30/16 1015  BP: (!) 169/68 (!) 180/73  Pulse: 80 79  Resp: 17 16  Temp:      Last Pain:  Vitals:   09/30/16 0953  TempSrc:   PainSc: Asleep                 Candon Caras S

## 2016-09-30 NOTE — Op Note (Signed)
PATIENT:  Sara Lamb  66 y.o. female  PRE-OPERATIVE DIAGNOSIS:  Chronic cholecystitis  POST-OPERATIVE DIAGNOSIS:  Chronic cholecystitis  PROCEDURE:  Procedure(s): LAPAROSCOPIC CHOLECYSTECTOMY   SURGEON:  Surgeon(s): Arta Bruce Genna Casimir, MD  ASSISTANT: none  ANESTHESIA:   local and general  Indications for procedure: Sara Lamb is a 66 y.o. female with symptoms of Abdominal pain and Nausea and vomiting consistent with gallbladder disease, Confirmed by Ultrasound.  Description of procedure: The patient was brought into the operative suite, placed supine. Anesthesia was administered with endotracheal tube. Patient was strapped in place and foot board was secured. All pressure points were offloaded by foam padding. The patient was prepped and draped in the usual sterile fashion.  A small incision was made to the right of the umbilicus. A 87m trocar was inserted into the peritoneal cavity with optical entry. Pneumoperitoneum was applied with high flow low pressure. 2 559mtrocars were placed in the RUQ. A 1221mrocar was placed in the subxiphoid space. All trocars sites were first anesthesized with 0.25% marcaine with epinephrine in the subcutaneous and preperitoneal layers. Next the patient was placed in reverse trendelenberg. The liver appeared diseased with cirrhotic changes. The omentum was adhesed to the liver and gallbladder these were taken down with cautery. The duodenum was bluntly dissected away from the infundibulum.  The gallbladder was retracted cephalad and lateral. The peritoneum was reflected off the infundibulum working lateral to medial. The cystic duct and cystic artery were identified and further dissection revealed a critical view. The cystic duct and cystic artery were doubly clipped and ligated.   The gallbladder was removed off the liver bed with cautery. The gallbladder was entered in this maneuver and thick black bile drained out. The bile was removed and the  area irrigated. The Gallbladder was placed in a specimen bag. The gallbladder fossa was irrigated and hemostasis was applied with cautery. The gallbladder was removed via the 59m48mocar. No dilation was required for removal, therefore no fascial closure was performed. Pneumoperitoneum was removed, all trocar were removed. All incisions were closed with 4-0 monocryl subcuticular stitch. The patient woke from anesthesia and was brought to PACU in stable condition. All counts were correct  Findings: cirrhotic changes of the liver, inflamed gallbladder, critical view obtained  Specimen: gallbladder  Blood loss: Total I/O In: 100 [I.V.:100] Out: 10 [Blood:10] ml  Local anesthesia: 20 ml 0.5%7.5%caine  Complications: none  PLAN OF CARE: Discharge to home after PACU  PATIENT DISPOSITION:  PACU - hemodynamically stable.  LukeGurney MaxinD. General, Bariatric, & Minimally Invasive Surgery Cent[facility], [facility]

## 2016-09-30 NOTE — Discharge Instructions (Signed)
°  Post Anesthesia Home Care Instructions  Activity: Get plenty of rest for the remainder of the day. A responsible adult should stay with you for 24 hours following the procedure.  For the next 24 hours, DO NOT: -Drive a car -Paediatric nurse -Drink alcoholic beverages -Take any medication unless instructed by your physician -Make any legal decisions or sign important papers.  Meals: Start with liquid foods such as gelatin or soup. Progress to regular foods as tolerated. Avoid greasy, spicy, heavy foods. If nausea and/or vomiting occur, drink only clear liquids until the nausea and/or vomiting subsides. Call your physician if vomiting continues.  Special Instructions/Symptoms: Your throat may feel dry or sore from the anesthesia or the breathing tube placed in your throat during surgery. If this causes discomfort, gargle with warm salt water. The discomfort should disappear within 24 hours.  If you had a scopolamine patch placed behind your ear for the management of post- operative nausea and/or vomiting:  1. The medication in the patch is effective for 72 hours, after which it should be removed.  Wrap patch in a tissue and discard in the trash. Wash hands thoroughly with soap and water. 2. You may remove the patch earlier than 72 hours if you experience unpleasant side effects which may include dry mouth, dizziness or visual disturbances. 3. Avoid touching the patch. Wash your hands with soap and water after contact with the patch.   Call your surgeon if you experience:   1.  Fever over 101.0. 2.  Inability to urinate. 3.  Nausea and/or vomiting. 4.  Extreme swelling or bruising at the surgical site. 5.  Continued bleeding from the incision. 6.  Increased pain, redness or drainage from the incision. 7.  Problems related to your pain medication. 8.  Any problems and/or concerns  HOME CARE INSTRUCTIONS - LAPAROSCOPY  Wound Care: The bandaids or dressing which are placed over the  skin openings may be removed the day after surgery. The incision should be kept clean and dry. The stitches do not need to be removed. Should the incision become sore, red, and swollen after the first week, check with your doctor.  Personal Hygiene: Shower the day after your procedure. Always wipe from front to back after elimination.   Activity: Do not drive or operate any equipment today. The effects of the anesthesia are still present and drowsiness may result. Rest today, not necessarily flat bed rest, just take it easy. You may resume your normal activity in one to three days or as instructed by your physician  Diet: Eat a light diet as desired this evening. You may resume a regular diet tomorrow.  Return to Work: Two to three days or as indicated by your doctor.  Expectations After Surgery:  Mild abdominal discomfort or tenderness is not unusual and some shoulder pain may also be noted which can be relieved by lying flat in pain.  Call Your Doctor If these Occur:  Persistent or heavy bleeding at incision site       Redness or swelling around incision       Elevation of temperature greater than 100 degrees F  Call for follow-up appointment _____________.

## 2016-09-30 NOTE — Transfer of Care (Signed)
Immediate Anesthesia Transfer of Care Note  Patient: Sara Lamb  Procedure(s) Performed: Procedure(s): LAPAROSCOPIC CHOLECYSTECTOMY (N/A)  Patient Location: PACU  Anesthesia Type:General  Level of Consciousness: sedated and responds to stimulation  Airway & Oxygen Therapy: Patient Spontanous Breathing and Patient connected to nasal cannula oxygen  Post-op Assessment: Report given to RN  Post vital signs: Reviewed and stable  Last Vitals: 150/63, 80, 17, 99%, 97.7 Vitals:   09/30/16 0656  BP: 117/65  Pulse: 97  Resp: 16  Temp: 37.1 C    Last Pain:  Vitals:   09/30/16 0709  TempSrc:   PainSc: 9       Patients Stated Pain Goal: 8 (01/80/97 0449)  Complications: No apparent anesthesia complications

## 2016-10-01 ENCOUNTER — Encounter (HOSPITAL_BASED_OUTPATIENT_CLINIC_OR_DEPARTMENT_OTHER): Payer: Self-pay | Admitting: General Surgery

## 2016-10-09 ENCOUNTER — Telehealth: Payer: Self-pay | Admitting: Internal Medicine

## 2016-10-09 ENCOUNTER — Encounter: Payer: Self-pay | Admitting: Internal Medicine

## 2016-10-09 ENCOUNTER — Ambulatory Visit (INDEPENDENT_AMBULATORY_CARE_PROVIDER_SITE_OTHER): Payer: PPO | Admitting: Internal Medicine

## 2016-10-09 VITALS — BP 132/80 | HR 90 | Wt 139.0 lb

## 2016-10-09 DIAGNOSIS — E1151 Type 2 diabetes mellitus with diabetic peripheral angiopathy without gangrene: Secondary | ICD-10-CM

## 2016-10-09 DIAGNOSIS — I700 Atherosclerosis of aorta: Secondary | ICD-10-CM

## 2016-10-09 MED ORDER — INSULIN GLARGINE 100 UNIT/ML SOLOSTAR PEN: 12.0000 [IU] | PEN_INJECTOR | Freq: Every day | SUBCUTANEOUS | 3 refills | Status: DC

## 2016-10-09 MED ORDER — INSULIN PEN NEEDLE 32G X 4 MM MISC: 3 refills | Status: AC

## 2016-10-09 NOTE — Telephone Encounter (Signed)
Pt wanted to also ask Dr. Cruzita Lederer what would be better than Coke Zero to drink, if she could drink unsweet tea, or flavored water, she said she cannot drink plain water.

## 2016-10-09 NOTE — Progress Notes (Signed)
Patient ID: Sara Lamb, female   DOB: 10/03/1950, 66 y.o.   MRN: [id number]   HPI: Sara Lamb is a 66 y.o.-year-old female, initially referred by her PCP, Vicie Mutters, PA,returning for f/u for DM2, dx in ~2010, insulin-dependent since 04/2016, uncontrolled, with complications (Atherosclerosis; reportedly cerebrovascular ds - TIAs). Last visit 3 mo ago. She is here with her husband who offers part of the history.  She had Gall bladder surgery last week.  Last hemoglobin A1c was: Lab Results  Component Value Date   HGBA1C 9.1 (H) 08/11/2016   HGBA1C 10.3 (H) 04/30/2016   HGBA1C 10.6 (H) 02/12/2016   She refused insulin at last visit.  Pt is on a regimen of: - Amaryl 4 mg 2x a day - Welchol 1 capsule a day She was Insulin 70/30 10 units in am and 6 units in pm (started 04/2016) >> but sugars higher after this  - 300-400 >> stopped. She was on Metformin before >> "this gave me ministrokes" She was also onTradjenta 5 mg daily before b'fast >> stopped as sugars were higher on this  Pt checks her sugars 2x a day and they are Still high: - am: n/c >> 179-317 - 2h after b'fast: n/c - before lunch: (fasting): 111-160 >> 180, 186 - 2h after lunch: n/c - before dinner: 120-140 >> 148- 327, 342 - 2h after dinner: n/c - bedtime: n/c - nighttime: n/c No lows. Lowest sugar was 111. Highest sugar was 250 (after Amaryl), 400s (before Amaryl).  Glucometer: Prodigy  Pt's meals are: - Breakfast: skips - cannot eat before 12:30 pm (N/V) - Lunch: salad - Dinner: chicken or seafood + veggies - Snacks: none  - no CKD, last BUN/creatinine:  Lab Results  Component Value Date   BUN 15 09/30/2016   BUN 6 (L) 09/15/2016   CREATININE 0.60 09/30/2016   CREATININE 0.62 09/15/2016  On Lisinopril. - last set of lipids: Lab Results  Component Value Date   CHOL 241 (H) 08/11/2016   HDL 42 (L) 08/11/2016   LDLCALC 150 (H) 08/11/2016   TRIG 245 (H) 08/11/2016   CHOLHDL 5.7 (H)  08/11/2016  Started Welchol. - last eye exam was in 2002 (!). No DR.  - no numbness and tingling in her feet.  She also HTN, HL, anxiety/depression. She reduced smoking (on Chantix) to 1/2 PPD (from 2 PPD).  ROS: Constitutional: no weight gain/loss, + fatigue, + both: subjective hyperthermia/hypothermia Eyes: no blurry vision, no xerophthalmia ENT: no sore throat, no nodules palpated in throat, no dysphagia/odynophagia, no hoarseness Cardiovascular: no CP/SOB/palpitations/leg swelling Respiratory:+  Cough/no SOB/+ wheezing Gastrointestinal: no N/V/D/C Musculoskeletal: + muscle/+ joint aches Skin: + rash on post shoulders Neurological: + tremors/no numbness/tingling/dizziness, + HA  I reviewed pt's medications, allergies, PMH, social hx, family hx, and changes were documented in the history of present illness. Otherwise, unchanged from my initial visit note.  Past Medical History:  Diagnosis Date  . Anxiety disorder   . COPD (chronic obstructive pulmonary disease) ([place])    followed by pcp--  last exacerbation 11/ 2017  . Depression   . History of rib fracture    2004  . 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 Hyperlipidemia   . Hypertension   .  OA (osteoarthritis)    back, hips, knees, feet  . Pulmonary nodules    per CT chest 08-28-2016  -- bilateral upper lobe nodules  . Type 2 diabetes mellitus ([place])    followed by pcp--  last A1c 9.1 on 08-01-2016  . UTI (urinary tract infection)   . Wears dentures    Past Surgical History:  Procedure Laterality Date  . BILATERAL SALPINGOOPHORECTOMY  1997   via Laparoscopy  . CHOLECYSTECTOMY N/A 09/30/2016   Procedure: LAPAROSCOPIC CHOLECYSTECTOMY;  Surgeon: Mickeal Skinner, MD;  Location: [facility];  Service: General;   Laterality: N/A;  . KNEE ARTHROSCOPY Right 11/2014  . VAGINAL HYSTERECTOMY  age 26   Social History   Social History  . Marital status: Married    Spouse name: N/A  . Number of children: 1  . Years of education: N/A   Occupational History  . Retired   Social History Main Topics  . Smoking status: Current Every Day Smoker    Packs/day: 0.5    Types: Cigarettes  . Smokeless tobacco: Never Used       . Alcohol use No  . Drug use: No   Current Outpatient Prescriptions on File Prior to Visit  Medication Sig Dispense Refill  . albuterol (PROVENTIL) (2.5 MG/3ML) 0.083% nebulizer solution Take 3 mLs (2.5 mg total) by nebulization every 6 (six) hours as needed for wheezing or shortness of breath. 75 mL 12  . ALPRAZolam (XANAX) 1 MG tablet Take 1 tablet (1 mg total) by mouth 3 (three) times daily as needed. 90 tablet 2  . aspirin-acetaminophen-caffeine (EXCEDRIN MIGRAINE) 765-465-03 MG tablet Take by mouth every 6 (six) hours as needed for headache.    . budesonide-formoterol (SYMBICORT) 160-4.5 MCG/ACT inhaler Inhale 2 puffs into the lungs 2 (two) times daily. 1 Inhaler 3  . cefUROXime (CEFTIN) 250 MG tablet Take 1 tablet (250 mg total) by mouth 2 (two) times daily. (Patient taking differently: Take 250 mg by mouth 2 (two) times daily. Pt did not get filled due to allergy-- pt to call her pcp to get another re-ordered) 14 tablet 0  . colesevelam (WELCHOL) 625 MG tablet Take by mouth every morning.    . DiphenhydrAMINE HCl, Sleep, (SLEEP AID) 50 MG CAPS Take 1 capsule by mouth at bedtime.    . Diphenhydramine-APAP (PERCOGESIC) 12.5-325 MG TABS Take by mouth as needed.    . furosemide (LASIX) 40 MG tablet Take 0.5 tablets (20 mg total) by mouth daily. (Patient taking differently: Take 20 mg by mouth as needed. ) 30 tablet 11  . glimepiride (AMARYL) 4 MG tablet Take 1 tablet (4 mg total) by mouth 2 (two) times daily before lunch and supper. 180 tablet 1  . HYDROcodone-acetaminophen  (NORCO/VICODIN) 5-325 MG tablet Take 1-2 tablets by mouth every 6 (six) hours as needed for moderate pain. 30 tablet 0  . lisinopril (PRINIVIL,ZESTRIL) 40 MG tablet Take 1 tablet (40 mg total) by mouth daily. (Patient taking differently: Take 40 mg by mouth every morning. ) 90 tablet 3  . Nicotine (NICODERM CQ TD) Place onto the skin as needed.    . nicotine polacrilex (COMMIT) 2 MG lozenge Take 2 mg by mouth as needed for smoking cessation.    Marland Kitchen omeprazole (PRILOSEC) 20 MG capsule Take 20 mg by mouth every evening.    . varenicline (CHANTIX CONTINUING MONTH PAK) 1 MG tablet Take 1 tablet (1 mg total) by mouth 2 (two) times daily. (Patient taking differently: Take 0.5 mg  by mouth 2 (two) times daily. ) 56 tablet 2   No current facility-administered medications on file prior to visit.    Allergies  Allergen Reactions  . Metformin And Related     "metformin caused mini-strokes" and ELEVATED BS and also GLYBURIDE, ONGLYZA, ACTOS, INVOKANA caused elevated BS  . Insulins Rash  . Janumet [Sitagliptin-Metformin Hcl] Rash    Shakes/ rash  . Penicillins Rash   Family History  Problem Relation Age of Onset  . Cancer Father     PROSTATE  . Diabetes Father   . Hypertension Father   . Hyperlipidemia Sister   . Heart disease Brother   . Hyperlipidemia Brother   . Hypertension Brother   . Diabetes Brother   . Heart disease Sister   . Hyperlipidemia Sister   . Hypertension Sister   . Diabetes Mother   . Stroke Mother     PE: BP 132/80 (BP Location: Left Arm, Patient Position: Sitting)   Pulse 90   Wt 139 lb (63 kg)   SpO2 97%   BMI 28.07 kg/m  Wt Readings from Last 3 Encounters:  10/09/16 139 lb (63 kg)  09/30/16 137 lb 8 oz (62.4 kg)  09/15/16 140 lb 3.2 oz (63.6 kg)   Constitutional: overweight, in NAD Eyes: PERRLA, EOMI, no exophthalmos ENT: moist mucous membranes, no thyromegaly, no cervical lymphadenopathy Cardiovascular: RRR, No MRG Respiratory: CTA B Gastrointestinal:  abdomen soft, NT, ND, BS+ Musculoskeletal: no deformities, strength intact in all 4 Skin: moist, warm, no rashes Neurological: + Mild tremor with outstretched hands, DTR normal in all 4  ASSESSMENT: 1. DM2, non-insulin-dependent, uncontrolled, with complications - atherosclerosis - reportedly TIAs  PLAN:  1. Patient with long-standing, uncontrolled diabetes, on oral antidiabetic regimen only, with slight improvement in her sugars after increasing Amaryl and adding WelChol but her sugars are still quite high. At last visit, she refused to try other medicines for now as they either cause her sugars to increase or cause side effects. at this visit, since the sugars are still high, I again suggested insulin. She does accepted now. We discussed about correct insulin injection techniques and I demonstrated pen use. She understood how this works and had no further questions about it. - I advised her to: When injecting insulin:  Inject in the abdomen  Rotate the injection sites around the belly button  Change needle for each injection  Keep needle in for 10 sec after last unit of insulin in  Keep the insulin in use out of the fridge - I suggested to:  Patient Instructions  Please continue: - Amaryl 4 mg 2x a day - Welchol 1 capsule a day  Please add: Lantus 12 units at bedtime. If the sugars in am >150 , in 3 days, then increase Lantus to 14 units  If the sugars in am >150 , in 3 days, then increase Lantus to 16 units   Please return in 1.5 months with your sugar log.   - given sugar log and advised how to fill it and to bring it at next appt  - advised for yearly eye exams >> she needs one - Return to clinic in 1.5 mo with sugar log   Philemon Kingdom, MD PhD [facility]

## 2016-10-09 NOTE — Patient Instructions (Addendum)
Please continue: - Amaryl 4 mg 2x a day - Welchol 1 capsule a day  Please add: Lantus 12 units at bedtime. If the sugars in am >150 , in 3 days, then increase Lantus to 14 units  If the sugars in am >150 , in 3 days, then increase Lantus to 16 units   Please return in 1.5 months with your sugar log.

## 2016-10-10 DIAGNOSIS — J209 Acute bronchitis, unspecified: Secondary | ICD-10-CM | POA: Diagnosis not present

## 2016-10-10 DIAGNOSIS — J449 Chronic obstructive pulmonary disease, unspecified: Secondary | ICD-10-CM | POA: Diagnosis not present

## 2016-10-10 DIAGNOSIS — R05 Cough: Secondary | ICD-10-CM | POA: Diagnosis not present

## 2016-10-12 ENCOUNTER — Telehealth: Payer: Self-pay

## 2016-10-12 NOTE — Telephone Encounter (Signed)
Called patient and advised of Dr.Gherghe's note.  No questions at this time.

## 2016-10-12 NOTE — Telephone Encounter (Signed)
Please advise. Thank you

## 2016-10-12 NOTE — Telephone Encounter (Signed)
Fruit-infused water (with lemon, lime, kiwi, oranges - may also add a little stevia, maybe even cayenne pepper for a kick) and mineral water are the best.

## 2016-10-12 NOTE — Telephone Encounter (Signed)
Called and advised patient of Dr.Gherghe's, no other questions at this time.

## 2016-10-13 ENCOUNTER — Other Ambulatory Visit: Payer: Self-pay | Admitting: Physician Assistant

## 2016-10-13 MED ORDER — ALPRAZOLAM 1 MG PO TABS: 1.0000 mg | ORAL_TABLET | Freq: Three times a day (TID) | ORAL | 2 refills | Status: DC | PRN

## 2016-11-10 ENCOUNTER — Ambulatory Visit: Payer: Self-pay | Admitting: Internal Medicine

## 2016-11-10 DIAGNOSIS — J449 Chronic obstructive pulmonary disease, unspecified: Secondary | ICD-10-CM | POA: Diagnosis not present

## 2016-11-10 DIAGNOSIS — R05 Cough: Secondary | ICD-10-CM | POA: Diagnosis not present

## 2016-11-10 DIAGNOSIS — J209 Acute bronchitis, unspecified: Secondary | ICD-10-CM | POA: Diagnosis not present

## 2016-11-17 ENCOUNTER — Ambulatory Visit: Payer: Self-pay | Admitting: Internal Medicine

## 2016-11-23 ENCOUNTER — Encounter: Payer: Self-pay | Admitting: Gastroenterology

## 2016-11-23 ENCOUNTER — Ambulatory Visit (INDEPENDENT_AMBULATORY_CARE_PROVIDER_SITE_OTHER): Payer: PPO | Admitting: Physician Assistant

## 2016-11-23 VITALS — BP 112/84 | HR 88 | Temp 97.5°F | Resp 16 | Ht <= 58 in | Wt 141.2 lb

## 2016-11-23 DIAGNOSIS — E785 Hyperlipidemia, unspecified: Secondary | ICD-10-CM

## 2016-11-23 DIAGNOSIS — I10 Essential (primary) hypertension: Secondary | ICD-10-CM | POA: Diagnosis not present

## 2016-11-23 DIAGNOSIS — E1165 Type 2 diabetes mellitus with hyperglycemia: Secondary | ICD-10-CM | POA: Diagnosis not present

## 2016-11-23 DIAGNOSIS — E1151 Type 2 diabetes mellitus with diabetic peripheral angiopathy without gangrene: Secondary | ICD-10-CM | POA: Diagnosis not present

## 2016-11-23 DIAGNOSIS — I700 Atherosclerosis of aorta: Secondary | ICD-10-CM | POA: Diagnosis not present

## 2016-11-23 DIAGNOSIS — F17200 Nicotine dependence, unspecified, uncomplicated: Secondary | ICD-10-CM | POA: Diagnosis not present

## 2016-11-23 DIAGNOSIS — Z79899 Other long term (current) drug therapy: Secondary | ICD-10-CM

## 2016-11-23 DIAGNOSIS — K7460 Unspecified cirrhosis of liver: Secondary | ICD-10-CM | POA: Diagnosis not present

## 2016-11-23 DIAGNOSIS — J449 Chronic obstructive pulmonary disease, unspecified: Secondary | ICD-10-CM | POA: Diagnosis not present

## 2016-11-23 DIAGNOSIS — E1159 Type 2 diabetes mellitus with other circulatory complications: Secondary | ICD-10-CM

## 2016-11-23 DIAGNOSIS — K7581 Nonalcoholic steatohepatitis (NASH): Secondary | ICD-10-CM | POA: Diagnosis not present

## 2016-11-23 MED ORDER — BUPROPION HCL ER (XL) 150 MG PO TB24: 150.0000 mg | ORAL_TABLET | ORAL | 2 refills | Status: DC

## 2016-11-23 NOTE — Progress Notes (Signed)
3 MONTH  Assessment:    Essential hypertension - continue medications, DASH diet, exercise and monitor at home. Call if greater than 130/80.  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 Advised to quit smoking - Hemoglobin A1c - follow up Dr. Darnell Level Continue lantus, only take amaryl with food Take sugars 3 x a day and follow up Dr. Darnell Level  Chronic obstructive pulmonary disease, unspecified COPD type ([place]) Advised to quit smoking.  Given spacer for symbicort  T2_NIDDM, poorly controlled Discussed general issues about diabetes pathophysiology and management., Educational material distributed., Suggested low cholesterol diet., Encouraged aerobic exercise., Discussed foot care., Reminded to get yearly retinal exam. - follow up Dr. Darnell Level Continue lantus, only take amaryl with food Take sugars 3 x a day and follow up Dr. Darnell Level Atherosclerosis of abdominal aorta ([place]) Control blood pressure, cholesterol, glucose, increase exercise.  Advised to stop smoking - Lipid panel - Hemoglobin A1c   Hyperlipidemia, unspecified hyperlipidemia type Not willing to get on medication, will check cholesterol and attempt - Lipid panel  Tobacco use disorder Smoking cessation-  instruction/counseling given, counseled patient on the dangers of tobacco use, advised patient to stop smoking, and reviewed strategies to maximize success, continue chantix  -     buPROPion (WELLBUTRIN XL) 150 MG 24 hr tablet; Take 1 tablet (150 mg total) by mouth every morning.  Liver cirrhosis secondary to NASH [facility]) Will check labs, will refer to GI Weight loss advised -     Gamma GT -      AMMONIA -     Protime-INR -     Ambulatory referral to Gastroenterology   Over 40 minutes of exam, counseling, chart review and critical decision making was performed Future Appointments Date Time Provider [place]  12/01/2016 2:15 PM Philemon Kingdom, MD [facility] None  03/04/2017 11:15 AM Unk Pinto, MD [facility] None  08/16/2017 2:00 PM Vicie Mutters, PA-C [facility] None     Subjective:  Sara Lamb is a 66 y.o. female who presents for CPE and 3 month follow up for uncontrolled DM.   She is very noncompliant with test, vaccines, and has an aversion to medication with " reactions" to the majority of medications.   She recently had her gallbladder removed 09/30/2016, she did have cirrhotic changes of her liver seen during surgery. Will check PTT, ammonia.   She has history of COPD, still smoking, unable to take chantix, has had normal CT chest.   Her blood pressure has not been controlled at home, today their BP is BP: 112/84 She does not workout. She denies chest pain, shortness of breath, dizziness.  She is not on cholesterol medication and denies myalgias. Her cholesterol is not at goal. The cholesterol last visit was:   Lab Results  Component Value Date   CHOL 241 (H) 08/11/2016   HDL 42 (L) 08/11/2016   LDLCALC 150 (H) 08/11/2016   TRIG 245 (H) 08/11/2016   CHOLHDL 5.7 (H) 08/11/2016   She has not been working on diet and exercise for Diabetes with diabetic chronic kidney disease, she  she is not on bASA states she can take it due to fatigue, she is on amaryl 4 mg BID and welchol 1 capsules a day,  she was started on insulin now at 15 units, states that it is making her sugars high. , she is on ACE/ARB, and denies polydipsia, polyuria and visual disturbances. Last A1C was:  Lab Results  Component Value Date   HGBA1C 9.1 (H) 08/11/2016   Last GFR: Lab Results  Component Value Date   GFRNONAA >89 08/11/2016   Patient is on Vitamin D supplement.   Lab  Results  Component Value Date   VD25OH 13 (L) 08/11/2016     BMI is Body mass index is 29.51 kg/m., she is working on diet and exercise. Wt Readings from Last 3 Encounters:  11/23/16 141 lb 3.2 oz (64 kg)  10/09/16 139 lb (63 kg)  09/30/16 137 lb 8 oz (62.4 kg)     Medication Review: Current Outpatient Prescriptions on File Prior to Visit  Medication Sig Dispense Refill  . albuterol (PROVENTIL) (2.5 MG/3ML) 0.083% nebulizer solution Take 3 mLs (2.5 mg total) by nebulization every 6 (six) hours as needed for wheezing or shortness of breath. 75 mL 12  . ALPRAZolam (XANAX) 1 MG tablet Take 1 tablet (1 mg total) by mouth 3 (three) times daily as needed. 90 tablet 2  . aspirin-acetaminophen-caffeine (EXCEDRIN MIGRAINE) 272-536-64 MG tablet Take by mouth every 6 (six) hours as needed for headache.    . budesonide-formoterol (SYMBICORT) 160-4.5 MCG/ACT inhaler Inhale 2 puffs into the lungs 2 (two) times daily. 1 Inhaler 3  . colesevelam (WELCHOL) 625 MG tablet Take by mouth every morning.    . DiphenhydrAMINE HCl, Sleep, (SLEEP AID) 50 MG CAPS Take 1 capsule by mouth at bedtime.    . Diphenhydramine-APAP (PERCOGESIC) 12.5-325 MG TABS Take by mouth as needed.    . furosemide (LASIX) 40 MG tablet Take 0.5 tablets (20 mg total) by mouth daily. (Patient taking differently: Take 20 mg by mouth as needed. ) 30 tablet 11  . glimepiride (AMARYL) 4 MG tablet Take 1 tablet (4 mg total) by mouth 2 (two) times daily before lunch and supper. 180 tablet 1  . HYDROcodone-acetaminophen (NORCO/VICODIN) 5-325 MG tablet Take 1-2 tablets by mouth every 6 (six) hours as needed for moderate pain. 30 tablet 0  . Insulin Glargine (LANTUS SOLOSTAR) 100 UNIT/ML Solostar Pen Inject 12 Units into the skin at bedtime. (Patient taking differently: Inject 15 Units into the skin at bedtime. ) 5 pen 3  . Insulin Pen Needle (CAREFINE PEN NEEDLES) 32G X 4 MM MISC Use 1x a day 100 each 3  . lisinopril (PRINIVIL,ZESTRIL) 40 MG  tablet Take 1 tablet (40 mg total) by mouth daily. (Patient taking differently: Take 40 mg by mouth every morning. ) 90 tablet 3  . omeprazole (PRILOSEC) 20 MG capsule Take 20 mg by mouth every evening.     No current facility-administered medications on file prior to visit.     Allergies  Allergen Reactions  . Metformin And Related     "metformin caused mini-strokes" and ELEVATED BS and also GLYBURIDE, ONGLYZA, ACTOS, INVOKANA caused elevated BS  . Insulins Rash  . Janumet [Sitagliptin-Metformin Hcl] Rash    Shakes/ rash  . Penicillins Rash    Current Problems (verified) Patient Active Problem List   Diagnosis Date Noted  . Liver cirrhosis secondary to NASH ([place]) 11/23/2016  . Cholelithiases 09/15/2016  . Osteoporosis 05/27/2016  . Atherosclerosis of abdominal aorta ([place]) 04/30/2016  . 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 COPD (chronic obstructive pulmonary disease) ([place]) 04/30/2015  . Tobacco use  disorder 04/08/2015  . Poor compliance with medication 11/05/2014  . Medication management 12/28/2013  . Nonalcoholic hepatosteatosis   . Vitamin D deficiency   . Anxiety   . Hypertension   . Hyperlipidemia     Review of Systems  Constitutional: Negative for chills, fever and malaise/fatigue.  HENT: Negative for congestion, ear pain and sore throat.   Eyes: Negative.   Respiratory: Positive for cough and shortness of breath. Negative for wheezing.   Cardiovascular: Positive for orthopnea (sleeping on 2 pillows) and PND. Negative for chest pain, palpitations, claudication and leg swelling.  Gastrointestinal: Positive for abdominal pain (RUQ pain), constipation and nausea. Negative for blood in stool, diarrhea, heartburn, melena and vomiting.  Genitourinary: Negative.   Musculoskeletal: Positive for back pain and myalgias.  Skin: Negative.   Neurological: Negative for dizziness, sensory change, loss of consciousness and headaches.    Psychiatric/Behavioral: Negative for depression. The patient is not nervous/anxious and does not have insomnia.      Objective:     Today's Vitals   11/23/16 1201  BP: 112/84  Pulse: 88  Resp: 16  Temp: 97.5 F (36.4 C)  Weight: 141 lb 3.2 oz (64 kg)  Height: 4' 10"  (1.473 m)   Body mass index is 29.51 kg/m.  General appearance: alert, no distress, WD/WN, female HEENT: normocephalic, sclerae anicteric, TMs pearly, nares patent, no discharge or erythema, pharynx normal Oral cavity: MMM, no lesions Neck: supple, no lymphadenopathy, no thyromegaly, no masses Heart: RRR, normal S1, S2, no murmurs Lungs: decreased BS, with diffuse wheezing and rhonchi bilateral, no rales Abdomen: +bs, soft, obese, tender RUQ pain to palpation,  no masses, no hepatomegaly, no splenomegaly Musculoskeletal: nontender, no swelling, no obvious deformity Extremities: no edema, no cyanosis, no clubbing Pulses: 2+ symmetric, upper and lower extremities, normal cap refill Neurological: alert, oriented x 3, CN2-12 intact, strength normal upper extremities and lower extremities,  DTRs 2+ throughout, no cerebellar signs, gait antalgic Psychiatric: normal affect, behavior normal, pleasant    Vicie Mutters, PA-C   11/23/2016

## 2016-11-23 NOTE — Patient Instructions (Addendum)
Lantus ONLY AFFECTS YOUR MORNING SUGAR Continue to adjust your lantus based on the morning sugar Can go up 2 units every 3 days if your MORNING sugar is above 150   DO NOT TAKE THE AMARYL UNLESS YOU ARE EATING TAKE IT WITH LUNCH IF YOU EAT IT, IF YOU DO NOT EAT LUNCH DO NOT TAKE THE AMARYL   TAKE YOUR SUGAR 3 X A DAY  ALWAYS TAKE YOUR DINNER AMARYL   Your A1C is a measure of your sugar over the past 3 months and is not affected by what you have eaten over the past few days. Diabetes increases your chances of stroke and heart attack over 300 % and is the leading cause of blindness and kidney failure in the [place]. Please make sure you decrease bad carbs like white bread, white rice, potatoes, corn, soft drinks, pasta, cereals, refined sugars, sweet tea, dried fruits, and fruit juice. Good carbs are okay to eat in moderation like sweet potatoes, brown rice, whole grain pasta/bread, most fruit (except dried fruit) and you can eat as many veggies as you want.   Greater than 6.5 is considered diabetic. Between 6.4 and 5.7 is prediabetic If your A1C is less than 5.7 you are NOT diabetic.  Targets for Glucose Readings: Time of Check Target for patients WITHOUT Diabetes Target for DIABETICS  Before Meals Less than 100  less than 150  Two hours after meals Less than 200  Less than 250    If you have a smart phone, please look up Smoke Free app, this will help you stay on track and give you information about money you have saved, life that you have gained back and a ton of more information.   We are giving you chantix for smoking cessation. You can do it! And we are here to help! You may have heard some scary side effects about chantix, the three most common I hear about are nausea, crazy dreams and depression.  However, I like for my patients to try to stay on 1/2 a tablet twice a day rather than one tablet twice a day as normally prescribed. This helps decrease the chances of side effects  and helps save money by making a one month prescription last two months  Please start the prescription this way:  Start 1/2 tablet by mouth once daily after food with a full glass of water for 3 days Then do 1/2 tablet by mouth twice daily for 4 days. During this first week you can smoke, but try to stop after this week.  At this point we have several options: 1) continue on 1/2 tablet twice a day- which I encourage you to do. You can stay on this dose the rest of the time on the medication or if you still feel the need to smoke you can do one of the two options below. 2) do one tablet in the morning and 1/2 in the evening which helps decrease dreams. 3) do one tablet twice a day.   What if I miss a dose? If you miss a dose, take it as soon as you can. If it is almost time for your next dose, take only that dose. Do not take double or extra doses.  What should I watch for while using this medicine? Visit your doctor or health care professional for regular check ups. Ask for ongoing advice and encouragement from your doctor or healthcare professional, friends, and family to help you quit. If you smoke while  on this medication, quit again  Your mouth may get dry. Chewing sugarless gum or hard candy, and drinking plenty of water may help. Contact your doctor if the problem does not go away or is severe.  You may get drowsy or dizzy. Do not drive, use machinery, or do anything that needs mental alertness until you know how this medicine affects you. Do not stand or sit up quickly, especially if you are an older patient.   The use of this medicine may increase the chance of suicidal thoughts or actions. Pay special attention to how you are responding while on this medicine. Any worsening of mood, or thoughts of suicide or dying should be reported to your health care professional right away.  ADVANTAGES OF QUITTING SMOKING  Within 20 minutes, blood pressure decreases. Your pulse is at normal  level.  After 8 hours, carbon monoxide levels in the blood return to normal. Your oxygen level increases.  After 24 hours, the chance of having a heart attack starts to decrease. Your breath, hair, and body stop smelling like smoke.  After 48 hours, damaged nerve endings begin to recover. Your sense of taste and smell improve.  After 72 hours, the body is virtually free of nicotine. Your bronchial tubes relax and breathing becomes easier.  After 2 to 12 weeks, lungs can hold more air. Exercise becomes easier and circulation improves.  After 1 year, the risk of coronary heart disease is cut in half.  After 5 years, the risk of stroke falls to the same as a nonsmoker.  After 10 years, the risk of lung cancer is cut in half and the risk of other cancers decreases significantly.  After 15 years, the risk of coronary heart disease drops, usually to the level of a nonsmoker.  You will have extra money to spend on things other than cigarettes.    Cirrhosis Cirrhosis is long-term (chronic) liver injury. The liver is your largest internal organ, and it performs many functions. The liver converts food into energy, removes toxic material from your blood, makes important proteins, and absorbs necessary vitamins from your diet. If you have cirrhosis, it means many of your healthy liver cells have been replaced by scar tissue. This prevents blood from flowing through your liver, which makes it difficult for your liver to function. This scarring is not reversible, but treatment can prevent it from getting worse. What are the causes? Hepatitis C and long-term alcohol abuse are the most common causes of cirrhosis. Other causes include:  Nonalcoholic fatty liver disease.  Hepatitis B infection.  Autoimmune hepatitis.  Diseases that cause blockage of ducts inside the liver.  Inherited liver diseases.  Reactions to certain long-term medicines.  Parasitic infections.  Long-term exposure to  certain toxins. What increases the risk? You may have a higher risk of cirrhosis if you:  Have certain hepatitis viruses.  Abuse alcohol, especially if you are female.  Are overweight.  Share needles.  Have unprotected sex with someone who has hepatitis. What are the signs or symptoms? You may not have any signs and symptoms at first. Symptoms may not develop until the damage to your liver starts to get worse. Signs and symptoms of cirrhosis may include:  Tenderness in the right-upper part of your abdomen.  Weakness and tiredness (fatigue).  Loss of appetite.  Nausea.  Weight loss and muscle loss.  Itchiness.  Yellow skin and eyes (jaundice).  Buildup of fluid in the abdomen (ascites).  Swelling of the feet and  ankles (edema).  Appearance of tiny blood vessels under the skin.  Mental confusion.  Easy bruising and bleeding. How is this diagnosed? Your health care provider may suspect cirrhosis based on your symptoms and medical history, especially if you have other medical conditions or a history of alcohol abuse. Your health care provider will do a physical exam to feel your liver and check for signs of cirrhosis. Your health care provider may perform other tests, including:  Blood tests to check:  Whether you have hepatitis B or C.  Kidney function.  Liver function.  Imaging tests such as:  MRI or CT scan to look for changes seen in advanced cirrhosis.  Ultrasound to see if normal liver tissue is being replaced by scar tissue.  A procedure using a long needle to take a sample of liver tissue (biopsy) for examination under a microscope. Liver biopsy can confirm the diagnosis of cirrhosis. How is this treated? Treatment depends on how damaged your liver is and what caused the damage. Treatment may include treating cirrhosis symptoms or treating the underlying causes of the condition to try to slow the progression of the damage. Treatment may include:  Making  lifestyle changes, such as:  Eating a healthy diet.  Restricting salt intake.  Maintaining a healthy weight.  Not abusing drugs or alcohol.  Taking medicines to:  Treat liver infections or other infections.  Control itching.  Reduce fluid buildup.  Reduce certain blood toxins.  Reduce risk of bleeding from enlarged blood vessels in the stomach or esophagus (varices).  If varices are causing bleeding problems, you may need treatment with a procedure that ties up the vessels causing them to fall off (band ligation).  If cirrhosis is causing your liver to fail, your health care provider may recommend a liver transplant.  Other treatments may be recommended depending on any complications of cirrhosis, such as liver-related kidney failure (hepatorenal syndrome). Follow these instructions at home:  Take medicines only as directed by your health care provider. Do not use drugs that are toxic to your liver. Ask your health care provider before taking any new medicines, including over-the-counter medicines.  Rest as needed.  Eat a well-balanced diet. Ask your health care provider or dietitian for more information.  You may have to follow a low-salt diet or restrict your water intake as directed.  Do not drink alcohol. This is especially important if you are taking acetaminophen.  Keep all follow-up visits as directed by your health care provider. This is important. Contact a health care provider if:  You have fatigue or weakness that is getting worse.  You develop swelling of the hands, feet, legs, or face.  You have a fever.  You develop loss of appetite.  You have nausea or vomiting.  You develop jaundice.  You develop easy bruising or bleeding. Get help right away if:  You vomit bright red blood or a material that looks like coffee grounds.  You have blood in your stools.  Your stools appear black and tarry.  You become confused.  You have chest pain or  trouble breathing. This information is not intended to replace advice given to you by your health care provider. Make sure you discuss any questions you have with your health care provider. Document Released: 07/13/2005 Document Revised: 11/21/2015 Document Reviewed: 03/21/2014 Elsevier Interactive Patient Education  2017 [employer].

## 2016-11-24 LAB — CBC WITH DIFFERENTIAL/PLATELET
Basophils Absolute: 188 cells/uL (ref 0–200)
Basophils Relative: 2 %
EOS PCT: 6 %
Eosinophils Absolute: 564 cells/uL — ABNORMAL HIGH (ref 15–500)
HCT: 44.9 % (ref 35.0–45.0)
HEMOGLOBIN: 15.4 g/dL (ref 11.7–15.5)
LYMPHS ABS: 2726 {cells}/uL (ref 850–3900)
LYMPHS PCT: 29 %
MCH: 29.8 pg (ref 27.0–33.0)
MCHC: 34.3 g/dL (ref 32.0–36.0)
MCV: 86.8 fL (ref 80.0–100.0)
MPV: 10 fL (ref 7.5–12.5)
Monocytes Absolute: 564 cells/uL (ref 200–950)
Monocytes Relative: 6 %
NEUTROS PCT: 57 %
Neutro Abs: 5358 cells/uL (ref 1500–7800)
Platelets: 264 10*3/uL (ref 140–400)
RBC: 5.17 MIL/uL — AB (ref 3.80–5.10)
RDW: 12.9 % (ref 11.0–15.0)
WBC: 9.4 10*3/uL (ref 3.8–10.8)

## 2016-11-24 LAB — BASIC METABOLIC PANEL WITH GFR
BUN: 10 mg/dL (ref 7–25)
CHLORIDE: 91 mmol/L — AB (ref 98–110)
CO2: 27 mmol/L (ref 20–31)
CREATININE: 0.76 mg/dL (ref 0.50–0.99)
Calcium: 10 mg/dL (ref 8.6–10.4)
GFR, EST NON AFRICAN AMERICAN: 83 mL/min (ref >=60)
GFR, Est African American: >89 mL/min (ref >=60)
Glucose, Bld: 138 mg/dL — ABNORMAL HIGH (ref 65–99)
POTASSIUM: 4.7 mmol/L (ref 3.5–5.3)
SODIUM: 129 mmol/L — AB (ref 135–146)

## 2016-11-24 LAB — AMMONIA: Ammonia: 36 umol/L (ref <=47)

## 2016-11-24 LAB — LIPID PANEL
CHOLESTEROL: 246 mg/dL — AB (ref <200)
HDL: 44 mg/dL — ABNORMAL LOW (ref >50)
LDL Cholesterol: 163 mg/dL — ABNORMAL HIGH (ref <100)
TRIGLYCERIDES: 195 mg/dL — AB (ref <150)
Total CHOL/HDL Ratio: 5.6 Ratio — ABNORMAL HIGH (ref <5.0)
VLDL: 39 mg/dL — AB (ref <30)

## 2016-11-24 LAB — PROTIME-INR
INR: 1
PROTHROMBIN TIME: 10.4 s (ref 9.0–11.5)

## 2016-11-24 LAB — TSH: TSH: 0.69 m[IU]/L

## 2016-11-24 LAB — HEPATIC FUNCTION PANEL
ALT: 38 U/L — ABNORMAL HIGH (ref 6–29)
AST: 39 U/L — ABNORMAL HIGH (ref 10–35)
Albumin: 4.1 g/dL (ref 3.6–5.1)
Alkaline Phosphatase: 158 U/L — ABNORMAL HIGH (ref 33–130)
BILIRUBIN DIRECT: 0.1 mg/dL (ref <=0.2)
BILIRUBIN TOTAL: 0.4 mg/dL (ref 0.2–1.2)
Indirect Bilirubin: 0.3 mg/dL (ref 0.2–1.2)
Total Protein: 7.6 g/dL (ref 6.1–8.1)

## 2016-11-24 LAB — HEMOGLOBIN A1C
Hgb A1c MFr Bld: 9.6 % — ABNORMAL HIGH (ref <5.7)
Mean Plasma Glucose: 229 mg/dL

## 2016-11-24 LAB — GAMMA GT: GGT: 146 U/L — AB (ref 7–51)

## 2016-11-24 LAB — MAGNESIUM: MAGNESIUM: 1.8 mg/dL (ref 1.5–2.5)

## 2016-11-24 NOTE — Progress Notes (Signed)
LVM for pt to return office call for LAB results. Call back @ 3 per pt request to give wife lab results.

## 2016-11-26 NOTE — Progress Notes (Signed)
Pt aware of lab results & voiced understanding of those results. Pt transferred to front office to schedule Come in two weeks with andy for repeat kidney and liver check

## 2016-12-01 ENCOUNTER — Ambulatory Visit (INDEPENDENT_AMBULATORY_CARE_PROVIDER_SITE_OTHER): Payer: PPO | Admitting: Internal Medicine

## 2016-12-01 ENCOUNTER — Encounter: Payer: Self-pay | Admitting: Internal Medicine

## 2016-12-01 VITALS — BP 140/84 | HR 93 | Wt 140.0 lb

## 2016-12-01 DIAGNOSIS — E1151 Type 2 diabetes mellitus with diabetic peripheral angiopathy without gangrene: Secondary | ICD-10-CM | POA: Diagnosis not present

## 2016-12-01 DIAGNOSIS — I700 Atherosclerosis of aorta: Secondary | ICD-10-CM | POA: Diagnosis not present

## 2016-12-01 NOTE — Progress Notes (Signed)
Patient ID: Sara Lamb, female   DOB: [birth year], 66 y.o.   MRN: [id number]   HPI: Sara Lamb is a 66 y.o.-year-old female, initially referred by her PCP, Vicie Mutters, PA,returning for f/u for DM2, dx in ~2010, insulin-dependent since 04/2016, uncontrolled, with complications (Atherosclerosis; reportedly cerebrovascular ds - TIAs). Last visit 1.5 mo ago.  Last hemoglobin A1c was: Lab Results  Component Value Date   HGBA1C 9.6 (H) 11/23/2016   HGBA1C 9.1 (H) 08/11/2016   HGBA1C 10.3 (H) 04/30/2016   Pt is on a regimen of: - Amaryl 4 mg 2x a day, before L  (first meal of the day) and D - Welchol 1 capsule a day - Lantus 10-11 (did not start at 12 or increase the dose as advised) units at bedtime - started 09/2016 She was Insulin 70/30 10 units in am and 6 units in pm (started 04/2016) >> but sugars higher after this  - 300-400 >> stopped. She was on Metformin before >> "this gave me ministrokes" She was also on Tradjenta 5 mg daily before b'fast >> stopped as sugars were higher on this  Pt checks her sugars 2x a day and they are still high, but better in last 2 weeks>> had 1 x 95 >> felt terrible: - am: n/c >> 179-317 >> 155-232 - 2h after b'fast: n/c - before lunch: (fasting): 111-160 >> 180, 186 >> 110-179 - 2h after lunch: n/c - before dinner: 120-140 >> 148- 327, 342 >> 95, 158-286 - 2h after dinner: n/c - bedtime: n/c - nighttime: n/c No lows. Lowest sugar was 111 >> 95. Highest sugar was 250 (after Amaryl), 400s (before Amaryl) >> 329  Glucometer: Prodigy  Pt's meals are: - Breakfast: skips - cannot eat before 12 pm >> N/V - Lunch: salad - Dinner: chicken or seafood + veggies - Snacks: none Cut back on Coke zero >> now unsweet tea. Also sparkling water.   - No CKD, last BUN/creatinine:  Lab Results  Component Value Date   BUN 10 11/23/2016   BUN 15 09/30/2016   CREATININE 0.76 11/23/2016   CREATININE 0.60 09/30/2016  On Lisinopril. - HAs HL  last set  of lipids: Lab Results  Component Value Date   CHOL 246 (H) 11/23/2016   HDL 44 (L) 11/23/2016   LDLCALC 163 (H) 11/23/2016   TRIG 195 (H) 11/23/2016   CHOLHDL 5.6 (H) 11/23/2016  Started Welchol. - last eye exam was in 2002 (!). No DR.  - no numbness and tingling in her feet.  She also HTN, HL, anxiety/depression.   Since last visit, she changed from Chantix to Wellbutrin to help with quitting smoking.  ROS: Constitutional: no weight gain/no weight loss, no fatigue, no subjective hyperthermia, no subjective hypothermia Eyes: no blurry vision, no xerophthalmia ENT: no sore throat, no nodules palpated in throat, no dysphagia, no odynophagia, no hoarseness Cardiovascular: no CP/no SOB/no palpitations/no leg swelling Respiratory: +cough/no SOB/+ wheezing Gastrointestinal: no N/no V/no D/+ C/no acid reflux Musculoskeletal: + muscle aches/+joint aches Skin: no rashes, no hair loss Neurological: no tremors/no numbness/no tingling/no dizziness  I reviewed pt's medications, allergies, PMH, social hx, family hx, and changes were documented in the history of present illness. Otherwise, unchanged from my initial visit note.  Past Medical History:  Diagnosis Date  . Anxiety disorder   . COPD (chronic obstructive pulmonary disease) ([place])    followed by pcp--  last exacerbation 11/ 2017  . Depression   . History of rib fracture  2004  . 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 Hyperlipidemia   . Hypertension   . OA (osteoarthritis)    back, hips, knees, feet  . Pulmonary nodules    per CT chest 08-28-2016  -- bilateral upper lobe nodules  . Type 2 diabetes mellitus ([place])    followed by pcp--  last A1c 9.1 on 08-01-2016  . UTI (urinary tract infection)   . Wears dentures    Past  Surgical History:  Procedure Laterality Date  . BILATERAL SALPINGOOPHORECTOMY  1997   via Laparoscopy  . CHOLECYSTECTOMY N/A 09/30/2016   Procedure: LAPAROSCOPIC CHOLECYSTECTOMY;  Surgeon: Mickeal Skinner, MD;  Location[facility];  Service: General;  Laterality: N/A;  . KNEE ARTHROSCOPY Right 11/2014  . VAGINAL HYSTERECTOMY  age 66   Social History   Social History  . Marital status: Married    Spouse name: N/A  . Number of children: 1  . Years of education: N/A   Occupational History  . Retired   Social History Main Topics  . Smoking status: Current Every Day Smoker    Packs/day: 0.5    Types: Cigarettes  . Smokeless tobacco: Never Used       . Alcohol use No  . Drug use: No   Current Outpatient Prescriptions on File Prior to Visit  Medication Sig Dispense Refill  . albuterol (PROVENTIL) (2.5 MG/3ML) 0.083% nebulizer solution Take 3 mLs (2.5 mg total) by nebulization every 6 (six) hours as needed for wheezing or shortness of breath. 75 mL 12  . ALPRAZolam (XANAX) 1 MG tablet Take 1 tablet (1 mg total) by mouth 3 (three) times daily as needed. 90 tablet 2  . aspirin-acetaminophen-caffeine (EXCEDRIN MIGRAINE) 188-416-60 MG tablet Take by mouth every 6 (six) hours as needed for headache.    . budesonide-formoterol (SYMBICORT) 160-4.5 MCG/ACT inhaler Inhale 2 puffs into the lungs 2 (two) times daily. 1 Inhaler 3  . buPROPion (WELLBUTRIN XL) 150 MG 24 hr tablet Take 1 tablet (150 mg total) by mouth every morning. 30 tablet 2  . colesevelam (WELCHOL) 625 MG tablet Take by mouth every morning.    . DiphenhydrAMINE HCl, Sleep, (SLEEP AID) 50 MG CAPS Take 1 capsule by mouth at bedtime.    . Diphenhydramine-APAP (PERCOGESIC) 12.5-325 MG TABS Take by mouth as needed.    . furosemide (LASIX) 40 MG tablet Take 0.5 tablets (20 mg total) by mouth daily. (Patient taking differently: Take 20 mg by mouth as needed. ) 30 tablet 11  . glimepiride (AMARYL) 4 MG tablet Take 1  tablet (4 mg total) by mouth 2 (two) times daily before lunch and supper. 180 tablet 1  . Insulin Glargine (LANTUS SOLOSTAR) 100 UNIT/ML Solostar Pen Inject 12 Units into the skin at bedtime. (Patient taking differently: Inject 15 Units into the skin at bedtime. ) 5 pen 3  . Insulin Pen Needle (CAREFINE PEN NEEDLES) 32G X 4 MM MISC Use 1x a day 100 each 3  . lisinopril (PRINIVIL,ZESTRIL) 40 MG tablet Take 1 tablet (40 mg total) by mouth daily. (Patient taking differently: Take 40 mg by mouth every morning. ) 90 tablet 3  . omeprazole (PRILOSEC) 20 MG capsule Take 20 mg by mouth every evening.  No current facility-administered medications on file prior to visit.    Allergies  Allergen Reactions  . Metformin And Related     "metformin caused mini-strokes" and ELEVATED BS and also GLYBURIDE, ONGLYZA, ACTOS, INVOKANA caused elevated BS  . Insulins Rash  . Janumet [Sitagliptin-Metformin Hcl] Rash    Shakes/ rash  . Penicillins Rash   Family History  Problem Relation Age of Onset  . Cancer Father     PROSTATE  . Diabetes Father   . Hypertension Father   . Hyperlipidemia Sister   . Heart disease Brother   . Hyperlipidemia Brother   . Hypertension Brother   . Diabetes Brother   . Heart disease Sister   . Hyperlipidemia Sister   . Hypertension Sister   . Diabetes Mother   . Stroke Mother     PE: BP 140/84 (BP Location: Left Arm, Patient Position: Sitting)   Pulse 93   Wt 140 lb (63.5 kg)   SpO2 98%   BMI 29.26 kg/m  Wt Readings from Last 3 Encounters:  12/01/16 140 lb (63.5 kg)  11/23/16 141 lb 3.2 oz (64 kg)  10/09/16 139 lb (63 kg)   Constitutional: overweight, in NAD, strong cigarette smell Eyes: PERRLA, EOMI, no exophthalmos ENT: moist mucous membranes, no thyromegaly, no cervical lymphadenopathy Cardiovascular: RRR, No MRG Respiratory: CTA B Gastrointestinal: abdomen soft, NT, ND, BS+ Musculoskeletal: no deformities, strength intact in all 4 Skin: moist, warm, no  rashes Neurological: + mild tremor with outstretched hands, DTR normal in all 4  ASSESSMENT: 1. DM2, insulin-dependent, uncontrolled, with complications - atherosclerosis - reportedly TIAs  PLAN:  1. Patient with long-standing, uncontrolled diabetes, previously on oral antidiabetic regimen only, but we added Lantus at last visit. She is using a lower dose than recommended despite continuing high sugars and did not titrate the dose up. I can see an improvement in her sugars after she saw PCP and her Amaryl dose was moved from am to before lunch. At this visit, we will increase Lantus. She absolutely refuses to add a new med.  - we discussed about diet >> made suggestions how to improve this - I advised her to: Patient Instructions  Please continue: - Amaryl 4 mg 2x a day before meals - Welchol 1 capsule a day  Please increase: - Lantus to 12 units at bedtime (if sugars in am not <150, then increase to 14 units)  Read the following books: Dr. Alyssa Grove - Program for Reversing Diabetes Dr. Karl Luke - Prevent and Reverse Heart Disease Dr. Alden Benjamin - How Not to Die  Please return in 3 months with your sugar log.   - reviewed last HbA1c, which was higher, at 9.6% (!)  - continue checking sugars at different times of the day - check 1x a day, rotating checks - advised for yearly eye exams >> she did not have one in last 16 years!!! Despite multiple promptings >> again discussed about the risks of not having regular checkups. - advised for flu shot >> she is UTD - Return to clinic in 3 mo with sugar log   Philemon Kingdom, MD PhD [facility]

## 2016-12-01 NOTE — Patient Instructions (Addendum)
Please continue: - Amaryl 4 mg 2x a day before meals - Welchol 1 capsule a day  Please increase: - Lantus to 12 units at bedtime (if sugars in am not <150, then increase to 14 units)  Read the following books: Dr. Alyssa Grove - Program for Reversing Diabetes Dr. Karl Luke - Prevent and Reverse Heart Disease Dr. Alden Benjamin - How Not to Die  Please return in 3 months with your sugar log.

## 2016-12-09 ENCOUNTER — Ambulatory Visit: Payer: PPO | Admitting: Endocrinology

## 2016-12-09 ENCOUNTER — Ambulatory Visit: Payer: PPO | Admitting: *Deleted

## 2016-12-09 DIAGNOSIS — Z79899 Other long term (current) drug therapy: Secondary | ICD-10-CM | POA: Diagnosis not present

## 2016-12-09 DIAGNOSIS — W57XXXA Bitten or stung by nonvenomous insect and other nonvenomous arthropods, initial encounter: Secondary | ICD-10-CM

## 2016-12-09 LAB — BASIC METABOLIC PANEL
BUN: 10 mg/dL (ref 7–25)
CHLORIDE: 93 mmol/L — AB (ref 98–110)
CO2: 26 mmol/L (ref 20–31)
Calcium: 9.3 mg/dL (ref 8.6–10.4)
Creat: 0.68 mg/dL (ref 0.50–0.99)
GLUCOSE: 208 mg/dL — AB (ref 65–99)
POTASSIUM: 3.9 mmol/L (ref 3.5–5.3)
SODIUM: 130 mmol/L — AB (ref 135–146)

## 2016-12-09 LAB — HEPATIC FUNCTION PANEL
ALK PHOS: 137 U/L — AB (ref 33–130)
ALT: 30 U/L — AB (ref 6–29)
AST: 26 U/L (ref 10–35)
Albumin: 3.8 g/dL (ref 3.6–5.1)
BILIRUBIN DIRECT: 0.1 mg/dL (ref <=0.2)
BILIRUBIN TOTAL: 0.3 mg/dL (ref 0.2–1.2)
Indirect Bilirubin: 0.2 mg/dL (ref 0.2–1.2)
Total Protein: 6.9 g/dL (ref 6.1–8.1)

## 2016-12-09 NOTE — Progress Notes (Signed)
Patient here for repeat BMET abd HFP. She states she removed a tick from her right neck last PM and complained of a headache and nausea today.  Temp was 98.8 and BP is 142/80.

## 2016-12-10 DIAGNOSIS — R05 Cough: Secondary | ICD-10-CM | POA: Diagnosis not present

## 2016-12-10 DIAGNOSIS — J449 Chronic obstructive pulmonary disease, unspecified: Secondary | ICD-10-CM | POA: Diagnosis not present

## 2016-12-10 DIAGNOSIS — J209 Acute bronchitis, unspecified: Secondary | ICD-10-CM | POA: Diagnosis not present

## 2016-12-10 LAB — ROCKY MTN SPOTTED FVR ABS PNL(IGG+IGM)
RMSF IGG: NOT DETECTED
RMSF IgM: NOT DETECTED

## 2016-12-10 NOTE — Progress Notes (Signed)
Pt aware of lab results & voiced understanding of those results.

## 2016-12-11 NOTE — Progress Notes (Signed)
Pt aware of lab results & voiced understanding of those results.

## 2016-12-28 NOTE — Addendum Note (Signed)
Addendum  created 12/28/16 1205 by Myrtie Soman, MD   Sign clinical note

## 2016-12-31 ENCOUNTER — Other Ambulatory Visit (INDEPENDENT_AMBULATORY_CARE_PROVIDER_SITE_OTHER): Payer: PPO

## 2016-12-31 ENCOUNTER — Encounter: Payer: Self-pay | Admitting: Gastroenterology

## 2016-12-31 ENCOUNTER — Ambulatory Visit (INDEPENDENT_AMBULATORY_CARE_PROVIDER_SITE_OTHER): Payer: PPO | Admitting: Gastroenterology

## 2016-12-31 VITALS — BP 172/80 | HR 82 | Ht <= 58 in | Wt 141.0 lb

## 2016-12-31 DIAGNOSIS — Z1211 Encounter for screening for malignant neoplasm of colon: Secondary | ICD-10-CM

## 2016-12-31 DIAGNOSIS — R197 Diarrhea, unspecified: Secondary | ICD-10-CM | POA: Diagnosis not present

## 2016-12-31 DIAGNOSIS — R1012 Left upper quadrant pain: Secondary | ICD-10-CM

## 2016-12-31 DIAGNOSIS — R932 Abnormal findings on diagnostic imaging of liver and biliary tract: Secondary | ICD-10-CM

## 2016-12-31 LAB — HEPATITIS A ANTIBODY, TOTAL: Hep A Total Ab: NONREACTIVE

## 2016-12-31 LAB — IGA: IGA: 588 mg/dL — AB (ref 68–378)

## 2016-12-31 LAB — BUN: BUN: 8 mg/dL (ref 6–23)

## 2016-12-31 LAB — HEPATITIS B SURFACE ANTIBODY,QUALITATIVE: HEP B S AB: NEGATIVE

## 2016-12-31 LAB — CREATININE, SERUM: Creatinine, Ser: 0.71 mg/dL (ref 0.40–1.20)

## 2016-12-31 LAB — HEPATITIS C ANTIBODY: HCV AB: NEGATIVE

## 2016-12-31 MED ORDER — NA SULFATE-K SULFATE-MG SULF 17.5-3.13-1.6 GM/177ML PO SOLN: 1.0000 | Freq: Once | ORAL | 0 refills | Status: AC

## 2016-12-31 MED ORDER — COLESTIPOL HCL 1 G PO TABS: 1.0000 g | ORAL_TABLET | Freq: Two times a day (BID) | ORAL | 3 refills | Status: DC

## 2016-12-31 NOTE — Progress Notes (Signed)
HPI :  66 y/o female with a history of TIAs, tobacco use, COPD, DM, referred for possible cirrhosis of the liver.   She had cholecystectomy 09/30/16 with Dr. Kieth Brightly. He reported her liver had a cirrhotic appearance to it in his operative report. Liver biopsy was not obtained at the time.   No history of known liver disease. She smokes cigarettes. She denies any alcohol use at all. No known FH of liver disease of HCC. No history of jaundice. Recent US 08/28/2016 showed a normal liver, however CT chest at that time showed a portion of the liver which had a cirrhotic appearance. She has a recent ALT and AP elevation.  She otherwise endorses she has had some ongoing LUQ pain, a "knot in her abdomen" which bothers her at times.  She reports this has been ongoing for several months. She states it is intermittent.  Pain is not related to eating at all. She was previously placed on prilosec but stopped it after she had her gallbladder removed.   She does endorse irregular bowel habits, sometimes constipated at baseline. Since her GB has been removed she has had increased loose stools with some incontinence at times. No blood in the stools.   She has not had a prior colonoscopy. No FH of colon cancer.   Prior workup. US abdomen 08/28/16 - GB with sludge and stones, liver normal, spleen normal size  Labs: 12/09/16  ALT 30, AST 26, AP 137, Bili normal GGT 146 Iron sat of 16% on 04/30/16 Normal ferritin of 199 on 04/30/15 HCV AB negative 04/30/15 CBC with normal platelet of 264 INR of 1.0  Past Medical History:  Diagnosis Date  . Anxiety disorder   . COPD (chronic obstructive pulmonary disease) ([place])    followed by pcp--  last exacerbation 11/ 2017  . Depression   . History of rib fracture    2004  . History of TIAs    x5-- last one 2017--  "gets real sharp stabbing pain over eye and speech screws up"  pt states takes excederin migraine and lies down (pt stated was told if she "if come to ER  again they would send her to psych ward")  . Hyperlipidemia   . Hypertension   . OA (osteoarthritis)    back, hips, knees, feet  . Pulmonary nodules    per CT chest 08-28-2016  -- bilateral upper lobe nodules  . Type 2 diabetes mellitus ([place])    followed by pcp--  last A1c 9.1 on 08-01-2016  . UTI (urinary tract infection)   . Wears dentures      Past Surgical History:  Procedure Laterality Date  . BILATERAL SALPINGOOPHORECTOMY  1997   via Laparoscopy  . CHOLECYSTECTOMY N/A 09/30/2016   Procedure: LAPAROSCOPIC CHOLECYSTECTOMY;  Surgeon: Mickeal Skinner, MD;  Location: [facility];  Service: General;  Laterality: N/A;  . KNEE ARTHROSCOPY Right 11/2014  . VAGINAL HYSTERECTOMY  age 67   Family History  Problem Relation Age of Onset  . Cancer Father        PROSTATE  . Diabetes Father   . Hypertension Father   . Hyperlipidemia Sister   . Heart disease Brother   . Hyperlipidemia Brother   . Hypertension Brother   . Diabetes Brother   . Heart disease Sister   . Hyperlipidemia Sister   . Hypertension Sister   . Diabetes Mother   . Stroke Mother    Social History  Substance Use Topics  .  Smoking status: Current Every Day Smoker    Packs/day: 0.50    Years: 35.00    Types: Cigarettes  . Smokeless tobacco: Never Used     Comment: pt trying to quit -- on chantix -- pt down from 2ppd    . Alcohol use No   Current Outpatient Prescriptions  Medication Sig Dispense Refill  . albuterol (PROVENTIL) (2.5 MG/3ML) 0.083% nebulizer solution Take 3 mLs (2.5 mg total) by nebulization every 6 (six) hours as needed for wheezing or shortness of breath. 75 mL 12  . ALPRAZolam (XANAX) 1 MG tablet Take 1 tablet (1 mg total) by mouth 3 (three) times daily as needed. 90 tablet 2  . aspirin-acetaminophen-caffeine (EXCEDRIN MIGRAINE) 086-761-95 MG tablet Take by mouth every 6 (six) hours as needed for headache.    . budesonide-formoterol (SYMBICORT) 160-4.5 MCG/ACT inhaler  Inhale 2 puffs into the lungs 2 (two) times daily. 1 Inhaler 3  . buPROPion (WELLBUTRIN XL) 150 MG 24 hr tablet Take 1 tablet (150 mg total) by mouth every morning. 30 tablet 2  . colesevelam (WELCHOL) 625 MG tablet Take by mouth every morning.    . DiphenhydrAMINE HCl, Sleep, (SLEEP AID) 50 MG CAPS Take 1 capsule by mouth at bedtime.    . Diphenhydramine-APAP (PERCOGESIC) 12.5-325 MG TABS Take by mouth as needed.    . furosemide (LASIX) 40 MG tablet Take 0.5 tablets (20 mg total) by mouth daily. (Patient taking differently: Take 20 mg by mouth as needed. ) 30 tablet 11  . glimepiride (AMARYL) 4 MG tablet Take 1 tablet (4 mg total) by mouth 2 (two) times daily before lunch and supper. 180 tablet 1  . Insulin Glargine (LANTUS SOLOSTAR) 100 UNIT/ML Solostar Pen Inject 12 Units into the skin at bedtime. (Patient taking differently: Inject 15 Units into the skin at bedtime. ) 5 pen 3  . Insulin Pen Needle (CAREFINE PEN NEEDLES) 32G X 4 MM MISC Use 1x a day 100 each 3  . lisinopril (PRINIVIL,ZESTRIL) 40 MG tablet Take 1 tablet (40 mg total) by mouth daily. (Patient taking differently: Take 40 mg by mouth every morning. ) 90 tablet 3   No current facility-administered medications for this visit.    Allergies  Allergen Reactions  . Metformin And Related     "metformin caused mini-strokes" and ELEVATED BS and also GLYBURIDE, ONGLYZA, ACTOS, INVOKANA caused elevated BS  . Insulins Rash  . Janumet [Sitagliptin-Metformin Hcl] Rash    Shakes/ rash  . Penicillins Rash     Review of Systems: All systems reviewed and negative except where noted in HPI.    Results as above  Lab Results  Component Value Date   ALT 30 (H) 12/09/2016   AST 26 12/09/2016   ALKPHOS 137 (H) 12/09/2016   BILITOT 0.3 12/09/2016    Lab Results  Component Value Date   INR 1.0 11/23/2016    Lab Results  Component Value Date   WBC 9.4 11/23/2016   HGB 15.4 11/23/2016   HCT 44.9 11/23/2016   MCV 86.8 11/23/2016    PLT 264 11/23/2016    Physical Exam: BP (!) 172/80 (BP Location: Left Arm, Patient Position: Sitting, Cuff Size: Normal)   Pulse 82   Ht 4' 10"  (1.473 m)   Wt 141 lb (64 kg)   BMI 29.47 kg/m  Constitutional: Pleasant, female in no acute distress. HEENT: Normocephalic and atraumatic. Conjunctivae are normal. No scleral icterus. Neck supple.  Cardiovascular: Normal rate, regular rhythm.  Pulmonary/chest: Effort normal and  breath sounds normal. No wheezing, rales or rhonchi. Abdominal: Soft, nondistended, LUQ TTP without rebound / guarding, negative Carn[place]tt. There are no masses palpable. No hepatomegaly apprecited. Extremities: no edema Lymphadenopathy: No cervical adenopathy noted. Neurological: Alert and oriented to person place and time. Skin: Skin is warm and dry. No rashes noted. Psychiatric: Normal mood and affect. Behavior is normal.   ASSESSMENT AND PLAN: 66 year old female here for new patient evaluation discussed following issues:  Abnormal liver imaging - reported to have cirrhotic-appearing liver during recent cholecystectomy, liver biopsy was not performed at time. She does have a mild ALT and alkaline phosphatase elevation. Her spleen appears normal on imaging and her platelets and INR normal and that is reassuring. If she does have cirrhosis it would appear to be well-compensated. At this time I'm recommending full serologic workup for chronic liver diseases given this finding and her elevation in liver enzymes. I'm also recommending more imaging of the liver as her prior ultrasound was normal, and the CT chest only captured a portion of her liver. We'll obtain a CT scan of the abdomen with contrast to evaluate the liver better. Pending these results, she may need a liver biopsy if there is continued question if she has cirrhosis. She'll be contacted with results of these studies.  Left upper quadrant pain - ongoing for a few months, no clear triggers. We'll await CT scan  results. We'll place her back on omeprazole once daily in the interim to see if this helps.  Post-cholecystectomy diarrhea - we'll place her on trial of Colestid 1 g twice a day, assuming her diarrhea is related to postcholecystectomy state given her history and time course.  Colon cancer screening - no prior screening, long-standing tobacco use. I discussed options for colon cancer screening with her to include optical colonoscopy and stool based testing. I discussed risks and benefits of colonoscopy with her. Following our discussion she was agreeable to proceed with colonoscopy. Further recommendations pending this result.  Farmington Cellar, MD [facility] Pager [phone]  CC: Vicie Mutters, PA-C

## 2016-12-31 NOTE — Patient Instructions (Addendum)
If you are age 66 or older, your body mass index should be between 23-30. Your Body mass index is 29.47 kg/m. If this is out of the aforementioned range listed, please consider follow up with your Primary Care Provider.  If you are age 31 or younger, your body mass index should be between 19-25. Your Body mass index is 29.47 kg/m. If this is out of the aformentioned range listed, please consider follow up with your Primary Care Provider.   You have been scheduled for a CT scan of the abdomen and pelvis at [facility]. Please go to main entrance of the hospital.   You are scheduled on Thursday, June 14th at 8:30am. You should arrive 15 minutes prior to your appointment time for registration. Please follow the written instructions below on the day of your exam:  WARNING: IF YOU ARE ALLERGIC TO IODINE/X-RAY DYE, PLEASE NOTIFY RADIOLOGY IMMEDIATELY AT [phone]! YOU WILL BE GIVEN A 13 HOUR PREMEDICATION PREP.  1) Do not eat or drink anything after midnight the night prior to exam. 2) You have been given 2 bottles of oral contrast to drink. The solution may taste               better if refrigerated, but do NOT add ice or any other liquid to this solution. Shake             well before drinking.    Drink 1 bottle of contrast @ 6:30am (2 hours prior to your exam)  Drink 1 bottle of contrast @ 7:30am (1 hour prior to your exam)  You may take any medications as prescribed with a small amount of water except for the following: Metformin, Glucophage, Glucovance, Avandamet, Riomet, Fortamet, Actoplus Met, Janumet, Glumetza or Metaglip. The above medications must be held the day of the exam AND 48 hours after the exam.  The purpose of you drinking the oral contrast is to aid in the visualization of your intestinal tract. The contrast solution may cause some diarrhea. Before your exam is started, you will be given a small amount of fluid to drink. Depending on your individual set of symptoms, you  may also receive an intravenous injection of x-ray contrast/dye. Plan on being at [facility] for 30 minutes or longer, depending on the type of exam you are having performed.  This test typically takes 30-45 minutes to complete.  If you have any questions regarding your exam or if you need to reschedule, you may call the CT department at [phone]  between the hours of 8:00 am and 5:00 pm, Monday-Friday.  ________________________________________________________________________  We have sent the following medications to your pharmacy for you to pick up at your convenience:  [place] have been scheduled for a colonoscopy. Please follow written instructions given to you at your visit today.  Please pick up your prep supplies at the pharmacy within the next 1-3 days. If you use inhalers (even only as needed), please bring them with you on the day of your procedure. Your physician has requested that you go to www.startemmi.com and enter the access code given to you at your visit today. This web site gives a general overview about your procedure. However, you should still follow specific instructions given to you by our office regarding your preparation for the procedure.  Your physician has requested that you go to the basement for lab work before leaving today.  Thank you.

## 2017-01-01 LAB — ANTI-NUCLEAR AB-TITER (ANA TITER): ANA Titer 1: 1:80 {titer} — ABNORMAL HIGH

## 2017-01-01 LAB — TISSUE TRANSGLUTAMINASE, IGA: Tissue Transglutaminase Ab, IgA: 1 U/mL (ref <4)

## 2017-01-01 LAB — IGG: IGG (IMMUNOGLOBIN G), SERUM: 893 mg/dL (ref 694–1618)

## 2017-01-01 LAB — ANA: ANA: POSITIVE — AB

## 2017-01-04 LAB — CERULOPLASMIN: Ceruloplasmin: 36 mg/dL (ref 18–53)

## 2017-01-04 LAB — MITOCHONDRIAL ANTIBODIES: Mitochondrial M2 Ab, IgG: <=20 Units (ref <=20.0)

## 2017-01-04 LAB — ALPHA-1-ANTITRYPSIN: A-1 Antitrypsin, Ser: 188 mg/dL (ref 83–199)

## 2017-01-04 LAB — ANTI-SMOOTH MUSCLE ANTIBODY, IGG

## 2017-01-06 ENCOUNTER — Other Ambulatory Visit: Payer: Self-pay

## 2017-01-07 ENCOUNTER — Ambulatory Visit (INDEPENDENT_AMBULATORY_CARE_PROVIDER_SITE_OTHER): Payer: PPO | Admitting: Gastroenterology

## 2017-01-07 ENCOUNTER — Telehealth: Payer: Self-pay | Admitting: *Deleted

## 2017-01-07 ENCOUNTER — Ambulatory Visit (HOSPITAL_COMMUNITY)
Admission: RE | Admit: 2017-01-07 | Discharge: 2017-01-07 | Disposition: A | Discharge status: Home / Self Care | Payer: PPO | Source: Ambulatory Visit | Attending: Gastroenterology | Admitting: Gastroenterology

## 2017-01-07 DIAGNOSIS — R1012 Left upper quadrant pain: Secondary | ICD-10-CM | POA: Diagnosis not present

## 2017-01-07 DIAGNOSIS — R590 Localized enlarged lymph nodes: Secondary | ICD-10-CM | POA: Diagnosis not present

## 2017-01-07 DIAGNOSIS — Z23 Encounter for immunization: Secondary | ICD-10-CM

## 2017-01-07 DIAGNOSIS — D71 Functional disorders of polymorphonuclear neutrophils: Secondary | ICD-10-CM | POA: Insufficient documentation

## 2017-01-07 DIAGNOSIS — R932 Abnormal findings on diagnostic imaging of liver and biliary tract: Secondary | ICD-10-CM

## 2017-01-07 DIAGNOSIS — K769 Liver disease, unspecified: Secondary | ICD-10-CM | POA: Insufficient documentation

## 2017-01-07 DIAGNOSIS — K5900 Constipation, unspecified: Secondary | ICD-10-CM | POA: Diagnosis not present

## 2017-01-07 DIAGNOSIS — R197 Diarrhea, unspecified: Secondary | ICD-10-CM

## 2017-01-07 DIAGNOSIS — R918 Other nonspecific abnormal finding of lung field: Secondary | ICD-10-CM | POA: Insufficient documentation

## 2017-01-07 DIAGNOSIS — I700 Atherosclerosis of aorta: Secondary | ICD-10-CM | POA: Diagnosis not present

## 2017-01-07 DIAGNOSIS — Z1211 Encounter for screening for malignant neoplasm of colon: Secondary | ICD-10-CM

## 2017-01-07 MED ORDER — IOPAMIDOL (ISOVUE-300) INJECTION 61%
100.0000 mL | Freq: Once | INTRAVENOUS | Status: AC | PRN
  Administered 2017-01-07: 100 mL via INTRAVENOUS

## 2017-01-07 MED ORDER — IOPAMIDOL (ISOVUE-300) INJECTION 61%
INTRAVENOUS | Status: AC
  Filled 2017-01-07: qty 100

## 2017-01-07 NOTE — Telephone Encounter (Signed)
LM for the patient on her home number answering machine. I had given her the date of 02-02-2017 for her next Hep injection, #2. That is too early. I rescheduled it for 02-08-2017 Monday. Advised patient to call me if she has any questions.

## 2017-01-08 ENCOUNTER — Telehealth: Payer: Self-pay | Admitting: Gastroenterology

## 2017-01-08 NOTE — Telephone Encounter (Signed)
Dr. Havery Moros asked that I let patient know he will go over her results with her on Monday.

## 2017-01-10 DIAGNOSIS — J449 Chronic obstructive pulmonary disease, unspecified: Secondary | ICD-10-CM | POA: Diagnosis not present

## 2017-01-10 DIAGNOSIS — J209 Acute bronchitis, unspecified: Secondary | ICD-10-CM | POA: Diagnosis not present

## 2017-01-10 DIAGNOSIS — R05 Cough: Secondary | ICD-10-CM | POA: Diagnosis not present

## 2017-01-11 ENCOUNTER — Ambulatory Visit (AMBULATORY_SURGERY_CENTER): Payer: PPO | Admitting: Gastroenterology

## 2017-01-11 VITALS — BP 155/77 | HR 88 | Temp 98.4°F | Resp 16 | Ht <= 58 in | Wt 141.0 lb

## 2017-01-11 DIAGNOSIS — D123 Benign neoplasm of transverse colon: Secondary | ICD-10-CM

## 2017-01-11 DIAGNOSIS — Z1211 Encounter for screening for malignant neoplasm of colon: Secondary | ICD-10-CM | POA: Diagnosis not present

## 2017-01-11 DIAGNOSIS — K621 Rectal polyp: Secondary | ICD-10-CM

## 2017-01-11 DIAGNOSIS — Z1212 Encounter for screening for malignant neoplasm of rectum: Secondary | ICD-10-CM

## 2017-01-11 DIAGNOSIS — D125 Benign neoplasm of sigmoid colon: Secondary | ICD-10-CM | POA: Diagnosis not present

## 2017-01-11 DIAGNOSIS — D128 Benign neoplasm of rectum: Secondary | ICD-10-CM

## 2017-01-11 DIAGNOSIS — D129 Benign neoplasm of anus and anal canal: Secondary | ICD-10-CM

## 2017-01-11 DIAGNOSIS — D122 Benign neoplasm of ascending colon: Secondary | ICD-10-CM

## 2017-01-11 MED ORDER — SODIUM CHLORIDE 0.9 % IV SOLN: 500.0000 mL | INTRAVENOUS | Status: DC

## 2017-01-11 NOTE — Patient Instructions (Addendum)
YOU HAD AN ENDOSCOPIC PROCEDURE TODAY AT [facility]:   Refer to the procedure report that was given to you for any specific questions about what was found during the examination.  If the procedure report does not answer your questions, please call your gastroenterologist to clarify.  If you requested that your care partner not be given the details of your procedure findings, then the procedure report has been included in a sealed envelope for you to review at your convenience later.  YOU SHOULD EXPECT: Some feelings of bloating in the abdomen. Passage of more gas than usual.  Walking can help get rid of the air that was put into your GI tract during the procedure and reduce the bloating. If you had a lower endoscopy (such as a colonoscopy or flexible sigmoidoscopy) you may notice spotting of blood in your stool or on the toilet paper. If you underwent a bowel prep for your procedure, you may not have a normal bowel movement for a few days.  Please Note:  You might notice some irritation and congestion in your nose or some drainage.  This is from the oxygen used during your procedure.  There is no need for concern and it should clear up in a day or so.  SYMPTOMS TO REPORT IMMEDIATELY:   Following lower endoscopy (colonoscopy or flexible sigmoidoscopy):  Excessive amounts of blood in the stool  Significant tenderness or worsening of abdominal pains  Swelling of the abdomen that is new, acute  Fever of 100F or higher    For urgent or emergent issues, a gastroenterologist can be reached at any hour by calling [phone].   DIET:  We do recommend a small meal at first, but then you may proceed to your regular diet.  Drink plenty of fluids but you should avoid alcoholic beverages for 24 hours.  ACTIVITY:  You should plan to take it easy for the rest of today and you should NOT DRIVE or use heavy machinery until tomorrow (because of the sedation medicines used during the test).     FOLLOW UP: Our staff will call the number listed on your records the next business day following your procedure to check on you and address any questions or concerns that you may have regarding the information given to you following your procedure. If we do not reach you, we will leave a message.  However, if you are feeling well and you are not experiencing any problems, there is no need to return our call.  We will assume that you have returned to your regular daily activities without incident.  If any biopsies were taken you will be contacted by phone or by letter within the next 1-3 weeks.  Please call us at [phone] if you have not heard about the biopsies in 3 weeks.    SIGNATURES/CONFIDENTIALITY: You and/or your care partner have signed paperwork which will be entered into your electronic medical record.  These signatures attest to the fact that that the information above on your After Visit Summary has been reviewed and is understood.  Full responsibility of the confidentiality of this discharge information lies with you and/or your care-partner .   NO ASPIRIN, ASPIRIN CONTAINING PRODUCTS (BC OR GOODY POWDERS) OR NSAIDS (IBUPROFEN, ADVIL, ALEVE, AND MOTRIN) FOR 2 weeks after polyp removal ; TYLENOL IS OK TO TAKE  Await pathology results on polyps removed in a letter from Dr Havery Moros  Continue usual diet and medications  Endoscopy scheduled for July !  3 and previsit with nurse scheduled, see above

## 2017-01-11 NOTE — Progress Notes (Signed)
Pt's states no medical or surgical changes since previsit or office visit. 

## 2017-01-11 NOTE — Op Note (Addendum)
[place] Patient Name: Sara Lamb Procedure Date: 01/11/2017 3:33 PM MRN: [id number] Endoscopist: Remo Lipps P. Morgann Woodburn MD, MD Age: 66 Referring MD:  Date of Birth: [birth year] Gender: Female Account #: [id number] Procedure:                Colonoscopy Indications:              Screening for colorectal malignant neoplasm, This                            is the patient's first colonoscopy Medicines:                Monitored Anesthesia Care Procedure:                Pre-Anesthesia Assessment:                           - Prior to the procedure, a History and Physical                            was performed, and patient medications and                            allergies were reviewed. The patient's tolerance of                            previous anesthesia was also reviewed. The risks                            and benefits of the procedure and the sedation                            options and risks were discussed with the patient.                            All questions were answered, and informed consent                            was obtained. Prior Anticoagulants: The patient has                            taken no previous anticoagulant or antiplatelet                            agents. ASA Grade Assessment: III - A patient with                            severe systemic disease. After reviewing the risks                            and benefits, the patient was deemed in                            satisfactory condition to undergo the procedure.  After obtaining informed consent, the colonoscope                            was passed under direct vision. Throughout the                            procedure, the patient's blood pressure, pulse, and                            oxygen saturations were monitored continuously. The                            Model PCF-[id number]) scope was introduced                            through the  anus and advanced to the the terminal                            ileum, with identification of the appendiceal                            orifice and IC valve. The colonoscopy was performed                            without difficulty. The patient tolerated the                            procedure well. The quality of the bowel                            preparation was adequate. The terminal ileum,                            ileocecal valve, appendiceal orifice, and rectum                            were photographed. Scope In: 3:43:49 PM Scope Out: 4:08:06 PM Scope Withdrawal Time: 0 hours 19 minutes 26 seconds  Total Procedure Duration: 0 hours 24 minutes 17 seconds  Findings:                 The perianal and digital rectal examinations were                            normal.                           The terminal ileum appeared normal.                           A diminutive polyp was found in the ascending                            colon. The polyp was sessile. The polyp was removed  with a cold biopsy forceps. Resection and retrieval                            were complete.                           A 4 mm polyp was found in the ascending colon. The                            polyp was sessile. The polyp was removed with a                            cold snare. Resection and retrieval were complete.                           There was a medium-sized lipoma, in the ascending                            colon.                           A 3 mm polyp was found in the transverse colon. The                            polyp was sessile. The polyp was removed with a                            cold snare. Resection and retrieval were complete.                           A 5 mm polyp was found in the sigmoid colon. The                            polyp was sessile. The polyp was removed with a                            cold snare. Resection and retrieval were  complete.                           A 4 mm polyp was found in the rectum. The polyp was                            sessile. The polyp was removed with a cold snare.                            Resection and retrieval were complete.                           The exam was otherwise without abnormality. Complications:            No immediate complications. Estimated blood loss:  Minimal. Estimated Blood Loss:     Estimated blood loss was minimal. Impression:               - The examined portion of the ileum was normal.                           - One diminutive polyp in the ascending colon,                            removed with a cold biopsy forceps. Resected and                            retrieved.                           - One 4 mm polyp in the ascending colon, removed                            with a cold snare. Resected and retrieved.                           - Medium-sized lipoma in the ascending colon.                           - One 3 mm polyp in the transverse colon, removed                            with a cold snare. Resected and retrieved.                           - One 5 mm polyp in the sigmoid colon, removed with                            a cold snare. Resected and retrieved.                           - One 4 mm polyp in the rectum, removed with a cold                            snare. Resected and retrieved.                           - The examination was otherwise normal. Recommendation:           - Patient has a contact number available for                            emergencies. The signs and symptoms of potential                            delayed complications were discussed with the                            patient. Return to normal activities tomorrow.  Written discharge instructions were provided to the                            patient.                           - Resume previous diet.                            - Continue present medications.                           - Await pathology results.                           - Repeat colonoscopy is recommended for                            surveillance. The colonoscopy date will be                            determined after pathology results from today's                            exam become available for review.                           - No ibuprofen, naproxen, or other non-steroidal                            anti-inflammatory drugs for 2 weeks after polyp                            removal. Remo Lipps P. Anishka Bushard MD, MD 01/11/2017 4:14:42 PM This report has been signed electronically.

## 2017-01-11 NOTE — Progress Notes (Signed)
A and O x3. Report to RN. Tolerated MAC anesthesia well.

## 2017-01-11 NOTE — Progress Notes (Signed)
Called to room to assist during endoscopic procedure.  Patient ID and intended procedure confirmed with present staff. Received instructions for my participation in the procedure from the performing physician.  

## 2017-01-12 ENCOUNTER — Telehealth: Payer: Self-pay

## 2017-01-12 NOTE — Telephone Encounter (Signed)
Called the patient to ask if she got my message.  She said she did and will come on 02-08-2017 at 57 Am for her next Hep Injection.

## 2017-01-12 NOTE — Telephone Encounter (Signed)
  Follow up Call-  Call back number 01/11/2017  Post procedure Call Back phone  # [phone]  Permission to leave phone message Yes  Some recent data might be hidden     Patient questions:  Do you have a fever, pain , or abdominal swelling? No. Pain Score  0 *  Have you tolerated food without any problems? Yes.    Have you been able to return to your normal activities? Yes.    Do you have any questions about your discharge instructions: Diet   No. Medications  No. Follow up visit  No.  Do you have questions or concerns about your Care? No.  Actions: * If pain score is 4 or above: No action needed, pain <4.

## 2017-01-16 ENCOUNTER — Encounter: Payer: Self-pay | Admitting: Gastroenterology

## 2017-01-20 ENCOUNTER — Other Ambulatory Visit: Payer: Self-pay

## 2017-01-20 DIAGNOSIS — I700 Atherosclerosis of aorta: Principal | ICD-10-CM

## 2017-01-20 DIAGNOSIS — E1151 Type 2 diabetes mellitus with diabetic peripheral angiopathy without gangrene: Secondary | ICD-10-CM

## 2017-01-20 MED ORDER — ALPRAZOLAM 1 MG PO TABS: 1.0000 mg | ORAL_TABLET | Freq: Three times a day (TID) | ORAL | 2 refills | Status: DC | PRN

## 2017-01-20 MED ORDER — GLIMEPIRIDE 4 MG PO TABS: 4.0000 mg | ORAL_TABLET | Freq: Two times a day (BID) | ORAL | 1 refills | Status: DC

## 2017-01-22 ENCOUNTER — Encounter: Payer: Self-pay | Admitting: Gastroenterology

## 2017-01-22 ENCOUNTER — Ambulatory Visit (AMBULATORY_SURGERY_CENTER): Payer: Self-pay

## 2017-01-22 VITALS — Ht <= 58 in | Wt 142.0 lb

## 2017-01-22 DIAGNOSIS — R932 Abnormal findings on diagnostic imaging of liver and biliary tract: Secondary | ICD-10-CM

## 2017-01-22 NOTE — Progress Notes (Signed)
Denies allergies to eggs or soy products. Denies complication of anesthesia or sedation. Denies use of weight loss medication. Denies use of O2.   Emmi instructions declined. Does not have a computer.

## 2017-02-05 ENCOUNTER — Telehealth: Payer: Self-pay

## 2017-02-05 ENCOUNTER — Ambulatory Visit (AMBULATORY_SURGERY_CENTER): Payer: PPO | Admitting: Gastroenterology

## 2017-02-05 ENCOUNTER — Encounter: Payer: Self-pay | Admitting: Gastroenterology

## 2017-02-05 VITALS — BP 157/82 | HR 88 | Temp 98.4°F | Resp 16 | Ht <= 58 in | Wt 142.0 lb

## 2017-02-05 DIAGNOSIS — K2970 Gastritis, unspecified, without bleeding: Secondary | ICD-10-CM

## 2017-02-05 DIAGNOSIS — R1012 Left upper quadrant pain: Secondary | ICD-10-CM

## 2017-02-05 DIAGNOSIS — R932 Abnormal findings on diagnostic imaging of liver and biliary tract: Secondary | ICD-10-CM | POA: Diagnosis not present

## 2017-02-05 DIAGNOSIS — K2950 Unspecified chronic gastritis without bleeding: Secondary | ICD-10-CM | POA: Diagnosis not present

## 2017-02-05 DIAGNOSIS — K7460 Unspecified cirrhosis of liver: Secondary | ICD-10-CM

## 2017-02-05 DIAGNOSIS — R1013 Epigastric pain: Secondary | ICD-10-CM | POA: Diagnosis not present

## 2017-02-05 MED ORDER — LIDOCAINE 5 % EX PTCH: MEDICATED_PATCH | CUTANEOUS | 1 refills | Status: DC

## 2017-02-05 MED ORDER — SODIUM CHLORIDE 0.9 % IV SOLN: 500.0000 mL | INTRAVENOUS | Status: DC

## 2017-02-05 NOTE — Progress Notes (Signed)
Dental advisory given to [place] and oriented x3, pleased with MAC, report to RN Judson Roch

## 2017-02-05 NOTE — Patient Instructions (Signed)
YOU HAD AN ENDOSCOPIC PROCEDURE TODAY AT [facility]:   Refer to the procedure report that was given to you for any specific questions about what was found during the examination.  If the procedure report does not answer your questions, please call your gastroenterologist to clarify.  If you requested that your care partner not be given the details of your procedure findings, then the procedure report has been included in a sealed envelope for you to review at your convenience later.  YOU SHOULD EXPECT: Some feelings of bloating in the abdomen. Passage of more gas than usual.  Walking can help get rid of the air that was put into your GI tract during the procedure and reduce the bloating. If you had a lower endoscopy (such as a colonoscopy or flexible sigmoidoscopy) you may notice spotting of blood in your stool or on the toilet paper. If you underwent a bowel prep for your procedure, you may not have a normal bowel movement for a few days.  Please Note:  You might notice some irritation and congestion in your nose or some drainage.  This is from the oxygen used during your procedure.  There is no need for concern and it should clear up in a day or so.  SYMPTOMS TO REPORT IMMEDIATELY:   Following upper endoscopy (EGD)  Vomiting of blood or coffee ground material  New chest pain or pain under the shoulder blades  Painful or persistently difficult swallowing  New shortness of breath  Fever of 100F or higher  Black, tarry-looking stools  For urgent or emergent issues, a gastroenterologist can be reached at any hour by calling [phone].   DIET:  We do recommend a small meal at first, but then you may proceed to your regular diet.  Drink plenty of fluids but you should avoid alcoholic beverages for 24 hours.  MEDICATIONS: Continue present medications.  ACTIVITY:  You should plan to take it easy for the rest of today and you should NOT DRIVE or use heavy machinery until  tomorrow (because of the sedation medicines used during the test).    FOLLOW UP: Our staff will call the number listed on your records the next business day following your procedure to check on you and address any questions or concerns that you may have regarding the information given to you following your procedure. If we do not reach you, we will leave a message.  However, if you are feeling well and you are not experiencing any problems, there is no need to return our call.  We will assume that you have returned to your regular daily activities without incident.  If any biopsies were taken you will be contacted by phone or by letter within the next 1-3 weeks.  Please call us at [phone] if you have not heard about the biopsies in 3 weeks.   Thank you for allowing [place] to provide for your healthcare needs today.   SIGNATURES/CONFIDENTIALITY: You and/or your care partner have signed paperwork which will be entered into your electronic medical record.  These signatures attest to the fact that that the information above on your After Visit Summary has been reviewed and is understood.  Full responsibility of the confidentiality of this discharge information lies with you and/or your care-partner.

## 2017-02-05 NOTE — Progress Notes (Signed)
Called to room to assist during endoscopic procedure.  Patient ID and intended procedure confirmed with present staff. Received instructions for my participation in the procedure from the performing physician.  

## 2017-02-05 NOTE — Op Note (Signed)
[facility] Patient Name: Sara Lamb Procedure Date: 02/05/2017 9:57 AM MRN: [id number] Endoscopist: Remo Lipps P. Armbruster MD, MD Age: 66 Referring MD:  Date of Birth: 1950-08-09 Gender: Female Account #: [id number] Procedure:                Upper GI endoscopy Indications:              Abdominal pain in the left upper quadrant,                            Cirrhosis rule out esophageal varices Medicines:                Monitored Anesthesia Care Procedure:                Pre-Anesthesia Assessment:                           - Prior to the procedure, a History and Physical                            was performed, and patient medications and                            allergies were reviewed. The patient's tolerance of                            previous anesthesia was also reviewed. The risks                            and benefits of the procedure and the sedation                            options and risks were discussed with the patient.                            All questions were answered, and informed consent                            was obtained. Prior Anticoagulants: The patient has                            taken no previous anticoagulant or antiplatelet                            agents. ASA Grade Assessment: III - A patient with                            severe systemic disease. After reviewing the risks                            and benefits, the patient was deemed in                            satisfactory condition to undergo the procedure.  After obtaining informed consent, the endoscope was                            passed under direct vision. Throughout the                            procedure, the patient's blood pressure, pulse, and                            oxygen saturations were monitored continuously. The                            Endoscope was introduced through the mouth, and                            advanced to the  second part of duodenum. The upper                            GI endoscopy was accomplished without difficulty.                            The patient tolerated the procedure well. Scope In: Scope Out: Findings:                 Esophagogastric landmarks were identified: the                            Z-line was found at 36 cm, the gastroesophageal                            junction was found at 36 cm and the upper extent of                            the gastric folds was found at 36 cm from the                            incisors.                           The exam of the esophagus was otherwise normal. No                            evidence of esophageal varices.                           Diffuse minimal inflammation characterized by                            erythema and granularity was found in the entire                            examined stomach. Biopsies were taken with a cold  forceps for Helicobacter pylori testing.                           The exam of the stomach was otherwise normal. No                            gastric varices.                           The duodenal bulb and second portion of the                            duodenum were normal. Complications:            No immediate complications. Estimated blood loss:                            Minimal. Estimated Blood Loss:     Estimated blood loss was minimal. Impression:               - Esophagogastric landmarks identified.                           - Normal esophagus - no varices                           - Gastritis. Biopsied.                           - Normal stomach otherwise, no varices.                           - Normal duodenal bulb and second portion of the                            duodenum. Recommendation:           - Patient has a contact number available for                            emergencies. The signs and symptoms of potential                            delayed  complications were discussed with the                            patient. Return to normal activities tomorrow.                            Written discharge instructions were provided to the                            patient.                           - Resume previous diet.                           -  Continue present medications.                           - Await pathology results, rule out H pylori. Will                            treat if positive although I suspect pain is more                            likely musculoskeletal given exam and lack of other                            findings on CT scan. Would consider trial of                            lidocain patches                           - Repeat EGD in 2-3 years for variceal screening Steven P. Armbruster MD, MD 02/05/2017 10:22:06 AM This report has been signed electronically.

## 2017-02-05 NOTE — Progress Notes (Signed)
Patient arrives to recovery post upper endoscopy. VSS, Os sat 98%, skin warm, dry, and pink. Patient difficult to arouse initially,  alert and oriented upon arousal.  Upon assessment, patient c/o LLQ abdominal pain that she and her husband describe as the same pain she's been experiencing for several months. Patient passing air. Patient states Dr. Havery Moros spoke with her about prescribing a pain patch for her abdominal pain. Dr. Havery Moros notified of the above and Lidocaine patch sent to patient's pharmacy of choice electronically as per v.o. Dr. Havery Moros.

## 2017-02-05 NOTE — Telephone Encounter (Signed)
Initiated PA through cover my meds for patient's lidocaine patches.

## 2017-02-08 ENCOUNTER — Other Ambulatory Visit: Payer: Self-pay

## 2017-02-08 ENCOUNTER — Ambulatory Visit (INDEPENDENT_AMBULATORY_CARE_PROVIDER_SITE_OTHER): Payer: PPO | Admitting: Gastroenterology

## 2017-02-08 ENCOUNTER — Telehealth: Payer: Self-pay | Admitting: *Deleted

## 2017-02-08 DIAGNOSIS — Z9229 Personal history of other drug therapy: Secondary | ICD-10-CM

## 2017-02-08 DIAGNOSIS — Z23 Encounter for immunization: Secondary | ICD-10-CM | POA: Diagnosis not present

## 2017-02-08 NOTE — Telephone Encounter (Signed)
Incoming fax dated 02-07-2017 from the insurance states that the PA has been denied for the Lidocaine patches. Reason  : being used for an indication which is not approved or medically acceped.

## 2017-02-08 NOTE — Telephone Encounter (Signed)
  Follow up Call-  Call back number 02/05/2017 01/11/2017  Post procedure Call Back phone  # [phone]  Permission to leave phone message Yes Yes  Some recent data might be hidden     Patient questions:  Do you have a fever, pain , or abdominal swelling? No. Pain Score  0 *  Have you tolerated food without any problems? Yes.    Have you been able to return to your normal activities? Yes.    Do you have any questions about your discharge instructions: Diet   No. Medications  No. Follow up visit  No.  Do you have questions or concerns about your Care? Yes.    Actions: * If pain score is 4 or above: No action needed, pain <4.

## 2017-02-08 NOTE — Telephone Encounter (Signed)
Patient was contacted she has found a 4% lidocaine over the counter patch. She sates this is helping and is only $11

## 2017-02-08 NOTE — Telephone Encounter (Signed)
She has abdominal wall pain, this is an appropriate therapy. She can try capsaicin cream OTC to see if this works, not sure if she has tried it yet if lidocaine is being denied. Thanks

## 2017-02-08 NOTE — Progress Notes (Signed)
Patient received second of [employer] series. She tolerated this well, waited in office 15 minutes after receiving this immunization. Let her know that we will call her to schedule to her last in the series, due 07/11/17 or after.

## 2017-02-09 DIAGNOSIS — R05 Cough: Secondary | ICD-10-CM | POA: Diagnosis not present

## 2017-02-09 DIAGNOSIS — J449 Chronic obstructive pulmonary disease, unspecified: Secondary | ICD-10-CM | POA: Diagnosis not present

## 2017-02-09 DIAGNOSIS — J209 Acute bronchitis, unspecified: Secondary | ICD-10-CM | POA: Diagnosis not present

## 2017-02-12 ENCOUNTER — Encounter: Payer: Self-pay | Admitting: Gastroenterology

## 2017-03-04 ENCOUNTER — Ambulatory Visit: Payer: Self-pay | Admitting: Internal Medicine

## 2017-03-04 ENCOUNTER — Encounter: Payer: Self-pay | Admitting: Internal Medicine

## 2017-03-04 ENCOUNTER — Ambulatory Visit (INDEPENDENT_AMBULATORY_CARE_PROVIDER_SITE_OTHER): Payer: PPO | Admitting: Internal Medicine

## 2017-03-04 VITALS — BP 134/78 | HR 95 | Ht 58.5 in | Wt 140.0 lb

## 2017-03-04 DIAGNOSIS — I700 Atherosclerosis of aorta: Secondary | ICD-10-CM | POA: Diagnosis not present

## 2017-03-04 DIAGNOSIS — E1151 Type 2 diabetes mellitus with diabetic peripheral angiopathy without gangrene: Secondary | ICD-10-CM

## 2017-03-04 DIAGNOSIS — E785 Hyperlipidemia, unspecified: Secondary | ICD-10-CM | POA: Diagnosis not present

## 2017-03-04 LAB — POCT GLYCOSYLATED HEMOGLOBIN (HGB A1C): Hemoglobin A1C: 8.6

## 2017-03-04 MED ORDER — INSULIN GLARGINE 100 UNIT/ML SOLOSTAR PEN: 14.0000 [IU] | PEN_INJECTOR | Freq: Every day | SUBCUTANEOUS | 11 refills | Status: DC

## 2017-03-04 NOTE — Patient Instructions (Addendum)
Please continue: - Amaryl 4 mg 2x a day before meals - Welchol 1 capsule a day  Please increase: - Lantus to 14 units at bedtime   Please return in 3 months with your sugar log.

## 2017-03-04 NOTE — Addendum Note (Signed)
Addended by: Caprice Beaver T on: 03/04/2017 03:05 PM   Modules accepted: Orders

## 2017-03-04 NOTE — Progress Notes (Signed)
Patient ID: [name], female   DOB: Oct 09, 1950, 66 y.o.   MRN: [id number]   HPI: Sara Lamb is a 66 y.o.-year-old female, initially referred by her PCP, Sara Mutters, PA,returning for f/u for DM2, dx in ~2010, insulin-dependent since 04/2016, uncontrolled, with complications (Atherosclerosis; reportedly cerebrovascular ds - TIAs). Last visit 3.5 mo ago.  Last hemoglobin A1c was: Lab Results  Component Value Date   HGBA1C 9.6 (H) 11/23/2016   HGBA1C 9.1 (H) 08/11/2016   HGBA1C 10.3 (H) 04/30/2016   Pt is on a regimen of: - Amaryl 4 mg 2x a day, before L  (first meal of the day) and D - Welchol 1 capsule a day - Lantus 12 units at bedtime She was Insulin 70/30 10 units in am and 6 units in pm (started 04/2016) >> but sugars higher after this  - 300-400 >> stopped. She was on Metformin before >> "this gave me ministrokes" She was also on Tradjenta 5 mg daily before b'fast >> stopped as sugars were higher on this  Pt checks her sugars 3x a day - per review of her log: - am: n/c >> 179-317 >> 155-232 >> 111, 132-224, 251 - 2h after b'fast: n/c - before lunch: (fasting): 111-160 >> 180, 186 >> 110-179 >> 119-189, 208, 235 - 2h after lunch: n/c - before dinner: 120-140 >> 148- 327, 342 >> 95, 158-286 >> 84, 103-288, 291 - 2h after dinner: n/c - bedtime: n/c - nighttime: n/c No lows. Lowest sugar was 111 >> 95 >> 84. Highest sugar was 250 (after Amaryl), 400s (before Amaryl) >> 329 >> 291  Glucometer: Prodigy  Pt's meals are: - Breakfast: skips - cannot eat before 12 pm Due to nausea - Lunch: salad - Dinner: chicken or seafood + veggies - Snacks: none No sodas.  - No CKD, last BUN/creatinine:  Lab Results  Component Value Date   BUN 8 12/31/2016   BUN 10 12/09/2016   CREATININE 0.71 12/31/2016   CREATININE 0.68 12/09/2016  She is on lisinopril - + HL;  last set of lipids: Lab Results  Component Value Date   CHOL 246 (H) 11/23/2016   HDL 44 (L) 11/23/2016   LDLCALC 163 (H) 11/23/2016   TRIG 195 (H) 11/23/2016   CHOLHDL 5.6 (H) 11/23/2016  She is on WelChol - last eye exam was in2002 >> No DR.  - She denies numbness and tingling in her feet.  She also HTN, HL, anxiety/depression.   On Wellbutrin to help with quitting smoking. Not helping, though.  ROS: Constitutional: no weight gain/no weight loss, + fatigue, no subjective hyperthermia, no subjective hypothermia Eyes: no blurry vision, no xerophthalmia ENT: no sore throat, no nodules palpated in throat, no dysphagia, no odynophagia, no hoarseness Cardiovascular: no CP/no SOB/no palpitations/no leg swelling Respiratory: no cough/no SOB/no wheezing Gastrointestinal: no N/no V/no D/no C/no acid reflux Musculoskeletal: + muscle aches (cramps)/no joint aches Skin: no rashes, no hair loss Neurological: no tremors/no numbness/no tingling/no dizziness  I reviewed pt's medications, allergies, PMH, social hx, family hx, and changes were documented in the history of present illness. Otherwise, unchanged from my initial visit note.  Past Medical History:  Diagnosis Date  . Anxiety   . Anxiety disorder   . COPD (chronic obstructive pulmonary disease) ([place])    followed by pcp--  last exacerbation 11/ 2017  . Depression    Patient denies  . History of rib fracture    2004  . History of TIAs  x5-- last one 2017--  "gets real sharp stabbing pain over eye and speech screws up"  pt states takes excederin migraine and lies down (pt stated was told if she "if come to ER again they would send her to psych ward")  . Hyperlipidemia   . Hypertension   . OA (osteoarthritis)    back, hips, knees, feet  . Osteoporosis   . Pulmonary nodules    per CT chest 08-28-2016  -- bilateral upper lobe nodules  . Stroke ([place])   . Type 2 diabetes mellitus ([place])    followed by pcp--  last A1c 9.1 on 08-01-2016  . UTI (urinary tract infection)   . Wears dentures    Past Surgical History:  Procedure Laterality  Date  . BILATERAL SALPINGOOPHORECTOMY  1997   via Laparoscopy  . CHOLECYSTECTOMY N/A 09/30/2016   Procedure: LAPAROSCOPIC CHOLECYSTECTOMY;  Surgeon: Sara Skinner, MD;  Location: [facility];  Service: General;  Laterality: N/A;  . KNEE ARTHROSCOPY Right 11/2014  . VAGINAL HYSTERECTOMY  age 56   Social History   Social History  . Marital status: Married    Spouse name: N/A  . Number of children: 1  . Years of education: N/A   Occupational History  . Retired   Social History Main Topics  . Smoking status: Current Every Day Smoker    Packs/day: 0.5    Types: Cigarettes  . Smokeless tobacco: Never Used       . Alcohol use No  . Drug use: No   Current Outpatient Prescriptions on File Prior to Visit  Medication Sig Dispense Refill  . albuterol (PROVENTIL) (2.5 MG/3ML) 0.083% nebulizer solution Take 3 mLs (2.5 mg total) by nebulization every 6 (six) hours as needed for wheezing or shortness of breath. 75 mL 12  . ALPRAZolam (XANAX) 1 MG tablet Take 1 tablet (1 mg total) by mouth 3 (three) times daily as needed. 90 tablet 2  . aspirin-acetaminophen-caffeine (EXCEDRIN MIGRAINE) 287-681-15 MG tablet Take by mouth every 6 (six) hours as needed for headache.    . budesonide-formoterol (SYMBICORT) 160-4.5 MCG/ACT inhaler Inhale 2 puffs into the lungs 2 (two) times daily. 1 Inhaler 3  . buPROPion (WELLBUTRIN XL) 150 MG 24 hr tablet Take 1 tablet (150 mg total) by mouth every morning. 30 tablet 2  . colesevelam (WELCHOL) 625 MG tablet Take by mouth every morning.    . colestipol (COLESTID) 1 g tablet Take 1 tablet (1 g total) by mouth 2 (two) times daily. 60 tablet 3  . DiphenhydrAMINE HCl, Sleep, (SLEEP AID) 50 MG CAPS Take 1 capsule by mouth at bedtime.    . Diphenhydramine-APAP (PERCOGESIC) 12.5-325 MG TABS Take by mouth as needed.    . furosemide (LASIX) 40 MG tablet Take 0.5 tablets (20 mg total) by mouth daily. (Patient taking differently: Take 20 mg by mouth as  needed. ) 30 tablet 11  . glimepiride (AMARYL) 4 MG tablet Take 1 tablet (4 mg total) by mouth 2 (two) times daily before lunch and supper. 180 tablet 1  . Insulin Glargine (LANTUS SOLOSTAR) 100 UNIT/ML Solostar Pen Inject 12 Units into the skin at bedtime. (Patient taking differently: Inject 15 Units into the skin at bedtime. ) 5 pen 3  . Insulin Pen Needle (CAREFINE PEN NEEDLES) 32G X 4 MM MISC Use 1x a day 100 each 3  . lidocaine (LIDODERM) 5 % Apply daily for 12 hours daily then remove. Apply to affected area per.M.D. 30 patch 1  .  lisinopril (PRINIVIL,ZESTRIL) 40 MG tablet Take 1 tablet (40 mg total) by mouth daily. (Patient taking differently: Take 40 mg by mouth every morning. ) 90 tablet 3   Current Facility-Administered Medications on File Prior to Visit  Medication Dose Route Frequency Provider Last Rate Last Dose  . 0.9 %  sodium chloride infusion  500 mL Intravenous Continuous Armbruster, Renelda Loma, MD      . 0.9 %  sodium chloride infusion  500 mL Intravenous Continuous Armbruster, Renelda Loma, MD       Allergies  Allergen Reactions  . Metformin And Related     "metformin caused mini-strokes" and ELEVATED BS and also GLYBURIDE, ONGLYZA, ACTOS, INVOKANA caused elevated BS  . Insulins Rash  . Janumet [Sitagliptin-Metformin Hcl] Rash    Shakes/ rash  . Penicillins Rash   Family History  Problem Relation Age of Onset  . Cancer Father        PROSTATE  . Diabetes Father   . Hypertension Father   . Hyperlipidemia Sister   . Heart disease Brother   . Hyperlipidemia Brother   . Hypertension Brother   . Diabetes Brother   . Heart disease Sister   . Hyperlipidemia Sister   . Hypertension Sister   . Diabetes Mother   . Stroke Mother   . Stomach cancer Mother   . Colon cancer Neg Hx   . Esophageal cancer Neg Hx   . Rectal cancer Neg Hx     PE: BP 134/78 (BP Location: Left Arm, Patient Position: Sitting)   Pulse 95   Ht 4' 10.5" (1.486 m)   Wt 140 lb (63.5 kg)   SpO2  97%   BMI 28.76 kg/m  Wt Readings from Last 3 Encounters:  03/04/17 140 lb (63.5 kg)  02/05/17 142 lb (64.4 kg)  01/22/17 142 lb (64.4 kg)   Constitutional: slightly overweight, in NAD Eyes: PERRLA, EOMI, no exophthalmos ENT: moist mucous membranes, no thyromegaly, no cervical lymphadenopathy Cardiovascular: tachycardia, RR, No MRG Respiratory: CTA B Gastrointestinal: abdomen soft, NT, ND, BS+ Musculoskeletal: no deformities, strength intact in all 4 Skin: moist, warm, no rashes Neurological: no tremor with outstretched hands, DTR normal in all 4  ASSESSMENT: 1. DM2, insulin-dependent, uncontrolled, with complications - atherosclerosis - reportedly TIAs >> no new events  2 HL  PLAN:  1. Patient with long-standing, uncontrolled diabetes, previously on oral antidiabetic regimen to which we added Lantus this year. We increased the dose at last visit, and her sugars improved, but she still has hyperglycemic spikes. I suspect that these are from dietary indiscretions. At this visit, she refuses to add another medication for her diabetes, so, since sugars are still high, I advised her to increase the Lantus dose. We will make small changes based on her comfort level.  - I advised her to: Patient Instructions  Please continue: - Amaryl 4 mg 2x a day before meals - Welchol 1 capsule a day  Please increase: - Lantus to 14 units at bedtime   Please return in 3 months with your sugar log.  .  - today, HbA1c is 8.6% (better) - continue checking sugars at different times of the day - check 1x a day, rotating checks - Advised for yearly eye exams >> she did not have one in last 16 years despite multiple promptings >> again discussed about the risks of not having regular checkups - Return to clinic in 3 mo with sugar log   2. HL -  She has significant hyperglycemia  and is only on WelChol - Of note, she has cirrhosis and she is followed by Dr. Havery Moros reviewed recent extensive  investigation.  - ALT was slightly elevated, as was her alkaline phosphatase   Philemon Kingdom, MD PhD [facility]

## 2017-03-22 ENCOUNTER — Ambulatory Visit: Payer: PPO | Admitting: Gastroenterology

## 2017-03-23 ENCOUNTER — Ambulatory Visit (INDEPENDENT_AMBULATORY_CARE_PROVIDER_SITE_OTHER): Payer: PPO | Admitting: Internal Medicine

## 2017-03-23 VITALS — BP 146/72 | HR 94 | Temp 97.7°F | Ht 58.5 in | Wt 139.0 lb

## 2017-03-23 DIAGNOSIS — E559 Vitamin D deficiency, unspecified: Secondary | ICD-10-CM | POA: Diagnosis not present

## 2017-03-23 DIAGNOSIS — Z794 Long term (current) use of insulin: Secondary | ICD-10-CM | POA: Diagnosis not present

## 2017-03-23 DIAGNOSIS — Z79899 Other long term (current) drug therapy: Secondary | ICD-10-CM

## 2017-03-23 DIAGNOSIS — I10 Essential (primary) hypertension: Secondary | ICD-10-CM | POA: Diagnosis not present

## 2017-03-23 DIAGNOSIS — E119 Type 2 diabetes mellitus without complications: Secondary | ICD-10-CM | POA: Diagnosis not present

## 2017-03-23 DIAGNOSIS — F09 Unspecified mental disorder due to known physiological condition: Secondary | ICD-10-CM | POA: Diagnosis not present

## 2017-03-23 DIAGNOSIS — E782 Mixed hyperlipidemia: Secondary | ICD-10-CM

## 2017-03-23 NOTE — Progress Notes (Signed)
This very nice cognitively challenged & poorly compliant  66 y.o. 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      Patient is treated for HTN (2000) & BP has been controlled at home. Today's BP is slightly elevated at 146/72. Patient has had no complaints of any cardiac type chest pain, palpitations, dyspnea/orthopnea/PND, dizziness, claudication, or dependent edema.     Hyperlipidemia is not controlled with diet & meds. Patient denies myalgias or other med SE's. 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 Patient's lack of insight wrt her Diabetes is exceeded only by her lack of compliance and her indifference consistent with her poor control.   Patient has denies symptoms of reactive hypoglycemia, diabetic polys, paresthesias or visual blurring.  Last A1c was not at goal: Lab Results  Component Value Date   HGBA1C 8.6 03/04/2017      Further, the patient also has history of Vitamin D Deficiency and will not supplement vitamin D as repeated ly recommended. Last vitamin D was  As usual very low: Lab Results  Component Value Date   VD25OH 14 (L) 03/23/2017   Current Outpatient Prescriptions on File Prior to Visit  Medication Sig  . albuterol (PROVENTIL) (2.5 MG/3ML) 0.083% nebulizer solution Take 3 mLs (2.5 mg total) by nebulization every 6 (six) hours as needed for wheezing or shortness of breath.  . ALPRAZolam (XANAX) 1 MG tablet Take 1 tablet (1 mg total) by mouth 3 (three) times daily as needed.  Marland Kitchen aspirin-acetaminophen-caffeine (EXCEDRIN MIGRAINE) 250-250-65 MG tablet Take by mouth every 6 (six)  hours as needed for headache.  . budesonide-formoterol (SYMBICORT) 160-4.5 MCG/ACT inhaler Inhale 2 puffs into the lungs 2 (two) times daily.  . colesevelam (WELCHOL) 625 MG tablet Take by mouth every morning.  . DiphenhydrAMINE HCl, Sleep, (SLEEP AID) 50 MG CAPS Take 1 capsule by mouth at bedtime.  . Diphenhydramine-APAP (PERCOGESIC) 12.5-325 MG TABS Take by mouth as needed.  . furosemide (LASIX) 40 MG tablet Take 0.5 tablets (20 mg total) by mouth daily. (Patient taking differently: Take 20 mg by mouth as needed. )  . glimepiride (AMARYL) 4 MG tablet Take 1 tablet (4 mg total) by mouth 2 (two) times daily before lunch and supper.  . Insulin Glargine (LANTUS SOLOSTAR) 100 UNIT/ML Solostar Pen Inject 14 Units into the skin at bedtime.  . Insulin Pen Needle (CAREFINE PEN NEEDLES) 32G X 4 MM MISC Use 1x a day  . lisinopril (PRINIVIL,ZESTRIL) 40 MG tablet Take 1 tablet (40 mg total) by mouth daily. (Patient taking differently: Take 40 mg by mouth every morning. )   No current facility-administered medications on file prior to visit.    Allergies  Allergen Reactions  . Metformin And Related     "metformin caused mini-strokes" and ELEVATED BS and also GLYBURIDE, ONGLYZA, ACTOS, INVOKANA caused elevated BS  . Insulins Rash  . Janumet [Sitagliptin-Metformin Hcl] Rash    Shakes/ rash  . Penicillins Rash   PMHx:   Past Medical History:  Diagnosis Date  . Anxiety   .  Anxiety disorder   . COPD (chronic obstructive pulmonary disease) ([place])    followed by pcp--  last exacerbation 11/ 2017  . Depression    Patient denies  . History of rib fracture    2004  . 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 Hyperlipidemia   . Hypertension   . OA (osteoarthritis)    back, hips, knees, feet  . Osteoporosis    . Pulmonary nodules    per CT chest 08-28-2016  -- bilateral upper lobe nodules  . Stroke ([place])   . Type 2 diabetes mellitus ([place])    followed by pcp--  last A1c 9.1 on 08-01-2016  . UTI (urinary tract infection)   . Wears dentures    Immunization History  Administered Date(s) Administered  . Hep A / Hep B 01/07/2017, 02/08/2017  . Td 06/26/2001  . Tdap 04/30/2015   Past Surgical History:  Procedure Laterality Date  . BILATERAL SALPINGOOPHORECTOMY  1997   via Laparoscopy  . CHOLECYSTECTOMY N/A 09/30/2016   Procedure: LAPAROSCOPIC CHOLECYSTECTOMY;  Surgeon: Mickeal Skinner, MD;  Location: [facility];  Service: General;  Laterality: N/A;  . KNEE ARTHROSCOPY Right 11/2014  . VAGINAL HYSTERECTOMY  age 43   FHx:    Reviewed / unchanged  SHx:    Reviewed / unchanged  Systems Review:  Constitutional: Denies fever, chills, wt changes, headaches, insomnia, fatigue, night sweats, change in appetite. Eyes: Denies redness, blurred vision, diplopia, discharge, itchy, watery eyes.  ENT: Denies discharge, congestion, post nasal drip, epistaxis, sore throat, earache, hearing loss, dental pain, tinnitus, vertigo, sinus pain, snoring.  CV: Denies chest pain, palpitations, irregular heartbeat, syncope, dyspnea, diaphoresis, orthopnea, PND, claudication or edema. Respiratory: denies cough, dyspnea, DOE, pleurisy, hoarseness, laryngitis, wheezing.  Gastrointestinal: Denies dysphagia, odynophagia, heartburn, reflux, water brash, abdominal pain or cramps, nausea, vomiting, bloating, diarrhea, constipation, hematemesis, melena, hematochezia  or hemorrhoids. Genitourinary: Denies dysuria, frequency, urgency, nocturia, hesitancy, discharge, hematuria or flank pain. Musculoskeletal: Denies arthralgias, myalgias, stiffness, jt. swelling, pain, limping or strain/sprain.  Skin: Denies pruritus, rash, hives, warts, acne, eczema or change in skin lesion(s). Neuro: No weakness, tremor,  incoordination, spasms, paresthesia or pain. Psychiatric: Denies confusion, memory loss or sensory loss. Endo: Denies change in weight, skin or hair change.  Heme/Lymph: No excessive bleeding, bruising or enlarged lymph nodes.  Physical Exam  BP (!) 146/72   Pulse 94   Temp 97.7 F (36.5 C)   Ht 4' 10.5" (1.486 m)   Wt 139 lb (63 kg)   SpO2 96%   BMI 28.56 kg/m   Appears well nourished, well groomed  and in no distress.  Eyes: PERRLA, EOMs, conjunctiva no swelling or erythema. Sinuses: No frontal/maxillary tenderness ENT/Mouth: EAC's clear, TM's nl w/o erythema, bulging. Nares clear w/o erythema, swelling, exudates. Oropharynx clear without erythema or exudates. Oral hygiene is good. Tongue normal, non obstructing. Hearing intact.  Neck: Supple. Thyroid nl. Car 2+/2+ without bruits, nodes or JVD. Chest: Respirations nl with BS clear & equal w/o rales, rhonchi, wheezing or stridor.  Cor: Heart sounds normal w/ regular rate and rhythm without sig. murmurs, gallops, clicks or rubs. Peripheral pulses normal and equal  without edema.  Abdomen: Soft & bowel sounds normal. Non-tender w/o guarding, rebound,  hernias, masses or organomegaly.  Lymphatics: Unremarkable.  Musculoskeletal: Full ROM all peripheral extremities, joint stability, 5/5 strength and normal gait.  Skin: Warm, dry without exposed rashes, lesions or ecchymosis apparent.  Neuro: Cranial nerves intact, reflexes equal bilaterally. Sensory-motor testing grossly intact. Tendon reflexes grossly intact.  Pysch: Alert & oriented x 3.  Insight and judgement nl & appropriate. No ideations.  Assessment and Plan:  1. Essential hypertension  - Continue medication, monitor blood pressure at home.  - Continue DASH diet. Reminder to go to the ER if any CP,  SOB, nausea, dizziness, severe HA, changes vision/speech.  - CBC with Differential/Platelet - BASIC METABOLIC PANEL WITH GFR - Magnesium - TSH  2. Hyperlipidemia,  mixed  - Continue diet/meds, exercise,& lifestyle modifications.    - Hepatic function panel - Lipid panel - TSH  3. Insulin-requiring or dependent type II diabetes mellitus ([place])  - Continue diet, exercise, lifestyle modifications.  - Monitor appropriate labs.  4. Vitamin D deficiency  - Continue supplementation.  - VITAMIN D 25 Hydroxy   5. Medication management  - CBC with Differential/Platelet - BASIC METABOLIC PANEL WITH GFR - Hepatic function panel - Magnesium - Lipid panel - TSH - VITAMIN D 25 Hydroxy          Discussed  regular exercise, BP monitoring, weight control to achieve/maintain BMI less than 25 and discussed med and SE's. Recommended labs to assess and monitor clinical status with further disposition pending results of labs. Over 30 minutes of exam, counseling, chart review was performed.

## 2017-03-23 NOTE — Patient Instructions (Signed)

## 2017-03-24 ENCOUNTER — Other Ambulatory Visit: Payer: Self-pay | Admitting: Internal Medicine

## 2017-03-24 DIAGNOSIS — E782 Mixed hyperlipidemia: Secondary | ICD-10-CM

## 2017-03-24 DIAGNOSIS — E871 Hypo-osmolality and hyponatremia: Secondary | ICD-10-CM

## 2017-03-24 MED ORDER — ROSUVASTATIN CALCIUM 40 MG PO TABS: ORAL_TABLET | ORAL | 5 refills | Status: DC

## 2017-03-25 LAB — BASIC METABOLIC PANEL WITH GFR
BUN: 8 mg/dL (ref 7–25)
CO2: 29 mmol/L (ref 20–32)
CREATININE: 0.71 mg/dL (ref 0.50–0.99)
Calcium: 9.6 mg/dL (ref 8.6–10.4)
Chloride: 92 mmol/L — ABNORMAL LOW (ref 98–110)
GFR, EST NON AFRICAN AMERICAN: 89 mL/min/{1.73_m2} (ref >=60)
GFR, Est African American: 104 mL/min/{1.73_m2} (ref >=60)
GLUCOSE: 165 mg/dL — AB (ref 65–99)
POTASSIUM: 4.4 mmol/L (ref 3.5–5.3)
SODIUM: 128 mmol/L — AB (ref 135–146)

## 2017-03-25 LAB — CBC WITH DIFFERENTIAL/PLATELET
BASOS ABS: 143 {cells}/uL (ref 0–200)
Basophils Relative: 1.5 %
EOS ABS: 675 {cells}/uL — AB (ref 15–500)
EOS PCT: 7.1 %
HCT: 41.9 % (ref 35.0–45.0)
HEMOGLOBIN: 14.4 g/dL (ref 11.7–15.5)
Lymphs Abs: 3040 cells/uL (ref 850–3900)
MCH: 29.6 pg (ref 27.0–33.0)
MCHC: 34.4 g/dL (ref 32.0–36.0)
MCV: 86.2 fL (ref 80.0–100.0)
MONOS PCT: 6.2 %
MPV: 10.3 fL (ref 7.5–12.5)
NEUTROS ABS: 5054 {cells}/uL (ref 1500–7800)
NEUTROS PCT: 53.2 %
Platelets: 278 10*3/uL (ref 140–400)
RBC: 4.86 10*6/uL (ref 3.80–5.10)
RDW: 11.9 % (ref 11.0–15.0)
Total Lymphocyte: 32 %
WBC mixed population: 589 cells/uL (ref 200–950)
WBC: 9.5 10*3/uL (ref 3.8–10.8)

## 2017-03-25 LAB — LIPID PANEL
CHOLESTEROL: 232 mg/dL — AB (ref <200)
HDL: 46 mg/dL — AB (ref >50)
LDL Cholesterol (Calc): 148 mg/dL (calc) — ABNORMAL HIGH
NON-HDL CHOLESTEROL (CALC): 186 mg/dL — AB (ref <130)
Total CHOL/HDL Ratio: 5 (calc) — ABNORMAL HIGH (ref <5.0)
Triglycerides: 238 mg/dL — ABNORMAL HIGH (ref <150)

## 2017-03-25 LAB — HEPATIC FUNCTION PANEL
AG RATIO: 1.2 (calc) (ref 1.0–2.5)
ALKALINE PHOSPHATASE (APISO): 127 U/L (ref 33–130)
ALT: 25 U/L (ref 6–29)
AST: 28 U/L (ref 10–35)
Albumin: 3.9 g/dL (ref 3.6–5.1)
BILIRUBIN TOTAL: 0.4 mg/dL (ref 0.2–1.2)
Bilirubin, Direct: 0.1 mg/dL (ref 0.0–0.2)
Globulin: 3.2 g/dL (calc) (ref 1.9–3.7)
Indirect Bilirubin: 0.3 mg/dL (calc) (ref 0.2–1.2)
TOTAL PROTEIN: 7.1 g/dL (ref 6.1–8.1)

## 2017-03-25 LAB — TSH: TSH: 0.86 mIU/L (ref 0.40–4.50)

## 2017-03-25 LAB — VITAMIN D 25 HYDROXY (VIT D DEFICIENCY, FRACTURES): Vit D, 25-Hydroxy: 14 ng/mL — ABNORMAL LOW (ref 30–100)

## 2017-03-25 LAB — MAGNESIUM: Magnesium: 1.9 mg/dL (ref 1.5–2.5)

## 2017-03-27 ENCOUNTER — Encounter: Payer: Self-pay | Admitting: Internal Medicine

## 2017-03-27 DIAGNOSIS — F09 Unspecified mental disorder due to known physiological condition: Secondary | ICD-10-CM | POA: Insufficient documentation

## 2017-04-01 ENCOUNTER — Other Ambulatory Visit: Payer: Self-pay | Admitting: Internal Medicine

## 2017-04-06 ENCOUNTER — Ambulatory Visit (INDEPENDENT_AMBULATORY_CARE_PROVIDER_SITE_OTHER): Payer: PPO | Admitting: Gastroenterology

## 2017-04-06 ENCOUNTER — Encounter: Payer: Self-pay | Admitting: Gastroenterology

## 2017-04-06 VITALS — BP 150/80 | HR 92 | Ht <= 58 in | Wt 140.5 lb

## 2017-04-06 DIAGNOSIS — K7460 Unspecified cirrhosis of liver: Secondary | ICD-10-CM | POA: Diagnosis not present

## 2017-04-06 DIAGNOSIS — R194 Change in bowel habit: Secondary | ICD-10-CM

## 2017-04-06 DIAGNOSIS — R1012 Left upper quadrant pain: Secondary | ICD-10-CM

## 2017-04-06 NOTE — Patient Instructions (Signed)
If you are age 66 or older, your body mass index should be between 23-30. Your Body mass index is 29.36 kg/m. If this is out of the aforementioned range listed, please consider follow up with your Primary Care Provider.  If you are age 37 or younger, your body mass index should be between 19-25. Your Body mass index is 29.36 kg/m. If this is out of the aformentioned range listed, please consider follow up with your Primary Care Provider.   Please purchase the following medications over the counter and take as directed:   Capsaicin Cream - use as needed topically for abdominal pain    Citrucel Fiber - use daily as needed   Dr. Havery Moros would like for you to have a repeat labwork and Ultrasound done in December.  We will contact you when it is time to schedule.    Thank you.

## 2017-04-06 NOTE — Progress Notes (Signed)
HPI :  66 year old female here for reassessment of the following issues:  Cirrhosis - she had CT scan 01/07/2017 - suggestive of cirrhosis, prominent upper lymph nodes, pulmonary nodules. She had a cholecystectomy in March at which point in time Dr. Alvino Blood thought the liver was cirrhotic when viewed. Labs for chronic liver diseases were negative. She declined liver biopsy. EGD 02/05/17 - no varices, gastritis - negative for HP, repeat in 2-3 years. Vaccinated to hep A and B since last visit. She denies any jaundice, ascites, or lower extremity edema.  Left upper quadrant pain - she reports ongoing symptoms. She thinks it comes and goes. She is tender to the touch. She is using arthritis cream which has not helped. No clear precipitants. Pain is not related to eating. She thinks if she twists to her left she can feel it - hot burning feeling.  She feels it every day. She thinks it can last minutes at a time. EGD, colonoscopy, and CT scan have not shown a cause. She has no vomiting, rare nausea.   Loose stools - She tried colestid but it did not help too much. She is having variable bowel habits - sometimes she does not go every day, sometimes she has a multiple stools. No loose or watery stools. No blood in the stools.   Colonoscopy 01/11/17 - 5 small polyps removed, 3 adenomas, recall in 3 years     Past Medical History:  Diagnosis Date  . Anxiety   . Anxiety disorder   . Cirrhosis ([place])   . COPD (chronic obstructive pulmonary disease) ([place])    followed by pcp--  last exacerbation 11/ 2017  . Depression    Patient denies  . Gastritis   . History of rib fracture    2004  . 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 Hyperlipidemia   . Hypertension   . OA (osteoarthritis)    back, hips,  knees, feet  . Osteoporosis   . Polyp of colon, hyperplastic   . Pulmonary nodules    per CT chest 08-28-2016  -- bilateral upper lobe nodules  . Stroke ([place])   . Tubular adenoma of colon   . Type 2 diabetes mellitus ([place])    followed by pcp--  last A1c 9.1 on 08-01-2016  . UTI (urinary tract infection)   . Wears dentures      Past Surgical History:  Procedure Laterality Date  . BILATERAL SALPINGOOPHORECTOMY  1997   via Laparoscopy  . CHOLECYSTECTOMY N/A 09/30/2016   Procedure: LAPAROSCOPIC CHOLECYSTECTOMY;  Surgeon: Mickeal Skinner, MD;  Location: [facility];  Service: General;  Laterality: N/A;  . KNEE ARTHROSCOPY Right 11/2014  . VAGINAL HYSTERECTOMY  age 68   Family History  Problem Relation Age of Onset  . Cancer Father        PROSTATE  . Diabetes Father   . Hypertension Father   . Hyperlipidemia Sister   . Heart disease Brother   . Hyperlipidemia Brother   . Hypertension Brother   . Diabetes Brother   . Heart disease Sister   . Hyperlipidemia Sister   . Hypertension Sister   . Diabetes Mother   . Stroke Mother   . Stomach cancer Mother   .  Colon cancer Neg Hx   . Esophageal cancer Neg Hx   . Rectal cancer Neg Hx    Social History  Substance Use Topics  . Smoking status: Current Every Day Smoker    Packs/day: 0.50    Years: 35.00    Types: Cigarettes  . Smokeless tobacco: Never Used     Comment: pt trying to quit -- on chantix -- pt down from 2ppd    . Alcohol use No   Current Outpatient Prescriptions  Medication Sig Dispense Refill  . albuterol (PROVENTIL) (2.5 MG/3ML) 0.083% nebulizer solution Take 3 mLs (2.5 mg total) by nebulization every 6 (six) hours as needed for wheezing or shortness of breath. 75 mL 12  . ALPRAZolam (XANAX) 1 MG tablet Take 1 tablet (1 mg total) by mouth 3 (three) times daily as needed. 90 tablet 2  . aspirin-acetaminophen-caffeine (EXCEDRIN MIGRAINE) 536-468-03 MG tablet Take by mouth every 6 (six) hours as  needed for headache.    . budesonide-formoterol (SYMBICORT) 160-4.5 MCG/ACT inhaler Inhale 2 puffs into the lungs 2 (two) times daily. 1 Inhaler 3  . Cholecalciferol (VITAMIN D3) 1000 units CAPS Take 1 capsule by mouth daily.    . colesevelam (WELCHOL) 625 MG tablet Take by mouth every morning.    . DiphenhydrAMINE HCl, Sleep, (SLEEP AID) 50 MG CAPS Take 1 capsule by mouth at bedtime.    . Diphenhydramine-APAP (PERCOGESIC) 12.5-325 MG TABS Take by mouth as needed.    . furosemide (LASIX) 40 MG tablet Take 0.5 tablets (20 mg total) by mouth daily. (Patient taking differently: Take 20 mg by mouth as needed. ) 30 tablet 11  . glimepiride (AMARYL) 4 MG tablet Take 1 tablet (4 mg total) by mouth 2 (two) times daily before lunch and supper. 180 tablet 1  . Insulin Glargine (LANTUS SOLOSTAR) 100 UNIT/ML Solostar Pen Inject 14 Units into the skin at bedtime. 5 pen 11  . Insulin Pen Needle (CAREFINE PEN NEEDLES) 32G X 4 MM MISC Use 1x a day 100 each 3  . lisinopril (PRINIVIL,ZESTRIL) 40 MG tablet Take 1 tablet (40 mg total) by mouth daily. (Patient taking differently: Take 40 mg by mouth every morning. ) 90 tablet 3  . rosuvastatin (CRESTOR) 40 MG tablet Take 1/2 to 1 tablet daily or as directed for Cholesterol 30 tablet 5   No current facility-administered medications for this visit.    Allergies  Allergen Reactions  . Metformin And Related     "metformin caused mini-strokes" and ELEVATED BS and also GLYBURIDE, ONGLYZA, ACTOS, INVOKANA caused elevated BS  . Insulins Rash  . Janumet [Sitagliptin-Metformin Hcl] Rash    Shakes/ rash  . Penicillins Rash     Review of Systems: All systems reviewed and negative except where noted in HPI.    No results found.  Physical Exam: BP (!) 150/80 (BP Location: Left Arm, Patient Position: Sitting, Cuff Size: Normal)   Pulse 92   Ht 4' 10"  (1.473 m)   Wt 140 lb 8 oz (63.7 kg)   BMI 29.36 kg/m  Constitutional: Pleasant,well-developed, female in no acute  distress. HEENT: Normocephalic and atraumatic. Conjunctivae are normal. No scleral icterus. Neck supple.  Cardiovascular: Normal rate, regular rhythm.  Pulmonary/chest: Effort normal and breath sounds normal. No wheezing, rales or rhonchi. Abdominal: Soft, nondistended, nmarked tenderness to LUQ with (+) [place], (+) diatasis recti hernia Bowel sounds active throughout. There are no masses palpable.  Extremities: no edema Lymphadenopathy: No cervical adenopathy noted. Neurological: Alert and oriented to person  place and time. Skin: Skin is warm and dry. No rashes noted. Psychiatric: Normal mood and affect. Behavior is normal.   ASSESSMENT AND PLAN: 66 year old female here for reassessment of the following issues:  Cirrhosis - incidentally noted during cholecystectomy and CT scan is consistent with this, although spleen is normal in size and platelets are normal, she appears compensated. Labs for chronic liver disease are negative. Most recent LFTs normal. We have discussed role of liver biopsy to confirm whether or not she has cirrhosis, and clarify underlying etiology, she wishes to avoid this for now given her LFTs are normal and that biopsy may likely not change her management. She is due for recall ultrasound in December with labs at that time. EGD shows no evidence of varices, repeat in 2 years or so. She agreed with the plan.  Abdominal wall pain - she is very tender to palpation in the abdominal wall and left upper quadrant, positive carnet sign. CT scan without other etiology. 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 We discussed management of this - topical modalities versus oral pain medicines versus pain management consult for trigger point injection. She wishes to use topical capsaicin cream for now, she declined other options. If follow-up is needed for this  issue.   Altered bowel habits - alternating constipation and diarrhea, relatively recent colonoscopy. Colestid did not help so she should stop it. Recommend trial of Citrucel to help regularize bowel habits. Recall colonoscopy 3 years from the last exam given history of adenomas.  Vona Cellar, MD [facility] Pager [phone]

## 2017-04-22 ENCOUNTER — Other Ambulatory Visit: Payer: Self-pay

## 2017-04-22 ENCOUNTER — Ambulatory Visit (INDEPENDENT_AMBULATORY_CARE_PROVIDER_SITE_OTHER): Payer: PPO

## 2017-04-22 VITALS — Ht <= 58 in | Wt 138.0 lb

## 2017-04-22 DIAGNOSIS — Z79899 Other long term (current) drug therapy: Secondary | ICD-10-CM

## 2017-04-22 DIAGNOSIS — E871 Hypo-osmolality and hyponatremia: Secondary | ICD-10-CM

## 2017-04-22 NOTE — Progress Notes (Signed)
Pt presents for lab only visit BMET. Pt reports she has stopped her lasix as well. Pt had no other concerns at this time.

## 2017-04-23 LAB — BASIC METABOLIC PANEL WITH GFR
BUN: 11 mg/dL (ref 7–25)
CHLORIDE: 96 mmol/L — AB (ref 98–110)
CO2: 27 mmol/L (ref 20–32)
Calcium: 9.2 mg/dL (ref 8.6–10.4)
Creat: 0.81 mg/dL (ref 0.50–0.99)
GFR, Est African American: 88 mL/min/{1.73_m2} (ref >=60)
GFR, Est Non African American: 76 mL/min/{1.73_m2} (ref >=60)
GLUCOSE: 290 mg/dL — AB (ref 65–99)
POTASSIUM: 4.7 mmol/L (ref 3.5–5.3)
SODIUM: 132 mmol/L — AB (ref 135–146)

## 2017-05-06 ENCOUNTER — Other Ambulatory Visit: Payer: Self-pay | Admitting: Physician Assistant

## 2017-05-06 NOTE — Telephone Encounter (Signed)
Pt aware.

## 2017-05-06 NOTE — Telephone Encounter (Signed)
Xanax has been called into pharmacy on 05/06/2017 by DD

## 2017-05-07 ENCOUNTER — Other Ambulatory Visit: Payer: Self-pay | Admitting: Physician Assistant

## 2017-05-07 NOTE — Telephone Encounter (Signed)
Please call Alpraz  

## 2017-05-18 ENCOUNTER — Telehealth: Payer: Self-pay | Admitting: Gastroenterology

## 2017-05-18 NOTE — Telephone Encounter (Signed)
Patient has been having watery diarrhea for 2-3 weeks, LLQ abdominal pain. Today she noticed a small amount of blood on the tissue. No fever. Suggested since she cannot take imodium to get some Gatorade to sip on, and I would send a message to Dr. Havery Moros.

## 2017-05-18 NOTE — Telephone Encounter (Signed)
She just had a colonoscopy 3-4 months ago, I suspect she is having bleeding from anorectal source - small hemorrhoids or fissure perhaps. If she is having new diarrhea which has persisted over a few weeks why don't we send a stool sample to rule out C diff. thanks

## 2017-05-19 ENCOUNTER — Other Ambulatory Visit: Payer: Self-pay

## 2017-05-19 DIAGNOSIS — R197 Diarrhea, unspecified: Secondary | ICD-10-CM

## 2017-05-19 NOTE — Telephone Encounter (Signed)
Spoke to patient, she will come to our lab and pick up specimen container. Patient reassured about the small amount of bleeding possibly from small hemorrhoidal tissue.

## 2017-05-25 ENCOUNTER — Other Ambulatory Visit: Payer: Self-pay | Admitting: Physician Assistant

## 2017-05-25 DIAGNOSIS — E1165 Type 2 diabetes mellitus with hyperglycemia: Secondary | ICD-10-CM

## 2017-05-25 DIAGNOSIS — I700 Atherosclerosis of aorta: Principal | ICD-10-CM

## 2017-05-25 DIAGNOSIS — E1151 Type 2 diabetes mellitus with diabetic peripheral angiopathy without gangrene: Secondary | ICD-10-CM

## 2017-05-25 DIAGNOSIS — I10 Essential (primary) hypertension: Secondary | ICD-10-CM

## 2017-05-25 MED ORDER — LISINOPRIL 40 MG PO TABS: 40.0000 mg | ORAL_TABLET | Freq: Every day | ORAL | 3 refills | Status: DC

## 2017-05-25 MED ORDER — GLIMEPIRIDE 4 MG PO TABS: 4.0000 mg | ORAL_TABLET | Freq: Two times a day (BID) | ORAL | 1 refills | Status: DC

## 2017-05-27 ENCOUNTER — Encounter: Payer: Self-pay | Admitting: Physician Assistant

## 2017-06-04 ENCOUNTER — Ambulatory Visit (INDEPENDENT_AMBULATORY_CARE_PROVIDER_SITE_OTHER): Payer: PPO | Admitting: Internal Medicine

## 2017-06-04 ENCOUNTER — Encounter: Payer: Self-pay | Admitting: Internal Medicine

## 2017-06-04 VITALS — BP 122/82 | HR 91 | Ht <= 58 in | Wt 139.0 lb

## 2017-06-04 DIAGNOSIS — I700 Atherosclerosis of aorta: Secondary | ICD-10-CM

## 2017-06-04 DIAGNOSIS — E782 Mixed hyperlipidemia: Secondary | ICD-10-CM

## 2017-06-04 DIAGNOSIS — E1151 Type 2 diabetes mellitus with diabetic peripheral angiopathy without gangrene: Secondary | ICD-10-CM

## 2017-06-04 LAB — POCT GLYCOSYLATED HEMOGLOBIN (HGB A1C): HEMOGLOBIN A1C: 8.8

## 2017-06-04 NOTE — Patient Instructions (Addendum)
Please continue: - Amaryl 4 mg 2x a day before meals - Welchol 1 capsule a day - Lantus 14 units at bedtime   Please stop at the lab.  We may need to increase the Lantus by 2 units if the fructosamine is also high.  Please return in 3 months with your sugar log.

## 2017-06-04 NOTE — Progress Notes (Signed)
Patient ID: Sara Lamb, female   DOB: [birth year], 66 y.o.   MRN: [id number]   HPI: Sara Lamb is a 66 y.o.-year-old female, initially referred by her PCP, Vicie Mutters, PA,returning for f/u for DM2, dx in ~2010, insulin-dependent since 04/2016, uncontrolled, with complications (Atherosclerosis; reportedly cerebrovascular ds - TIAs). Last visit 3 mo ago.  She has AP x ~ 2 months. She saw Dr. Havery Moros. She has intermittent diarrhea. She has to wear diapers.   Last hemoglobin A1c was: Lab Results  Component Value Date   HGBA1C 8.6 03/04/2017   HGBA1C 9.6 (H) 11/23/2016   HGBA1C 9.1 (H) 08/11/2016   Pt is on a regimen of: - Amaryl 4 mg 2x a day, before L  (first meal of the day) and D - Welchol 1 capsule a day - Lantus 12 >> 14 units at bedtime She was Insulin 70/30 10 units in am and 6 units in pm (started 04/2016) >> but sugars higher after this  - 300-400 >> stopped. She was on Metformin before >> "this gave me ministrokes" She was also on Tradjenta 5 mg daily before b'fast >> stopped as sugars were higher on this  Pt checks her sugars 3x a day - per log: - am: n/c >> 179-317 >> 155-232 >> 111, 132-224, 251 >> 103,122-174, 188, 197 (cold med) - 2h after b'fast: n/c - before lunch: (fasting): 110-179 >> 119-189, 208, 235 >> 107-189 - 2h after lunch: n/c - before dinner:  95, 158-286 >> 84, 103-288, 291 >> 89-189, 200 (when in pain) - 2h after dinner: n/c - bedtime: n/c - nighttime: n/c Lowest sugar was 111 >> 95 >> 84 >> 89 Highest sugar was 329 >> 291 >> 233  Glucometer: Prodigy  Pt's meals are: - Breakfast: skips - cannot eat before 12 pm Due to nausea - Lunch: salad - Dinner: chicken or seafood + veggies - Snacks: none No sodas.  - No CKD, last BUN/creatinine:  Lab Results  Component Value Date   BUN 11 04/22/2017   BUN 8 03/23/2017   CREATININE 0.81 04/22/2017   CREATININE 0.71 03/23/2017  On Lisinopril. - + HL;  last set of lipids: Lab Results   Component Value Date   CHOL 232 (H) 03/23/2017   HDL 46 (L) 03/23/2017   LDLCALC 163 (H) 11/23/2016   TRIG 238 (H) 03/23/2017   CHOLHDL 5.0 (H) 03/23/2017  She is on low dose WelChol. - last eye exam was in 2002 >> No DR. - No numbness and tingling in her feet.  She also HTN, anxiety/depression.   On Wellbutrin to help with quitting smoking. Not helping, though.  ROS: Constitutional: no weight gain/no weight loss, no fatigue, no subjective hyperthermia, no subjective hypothermia Eyes: no blurry vision, no xerophthalmia ENT: no sore throat, no nodules palpated in throat, no dysphagia, no odynophagia, no hoarseness Cardiovascular: no CP/no SOB/no palpitations/no leg swelling Respiratory: no cough/no SOB/no wheezing Gastrointestinal: + N/no V/+ D/no C/no acid reflux Musculoskeletal: + muscle aches/+ joint aches Skin: no rashes, no hair loss Neurological: no tremors/no numbness/no tingling/no dizziness  I reviewed pt's medications, allergies, PMH, social hx, family hx, and changes were documented in the history of present illness. Otherwise, unchanged from my initial visit note.  Past Medical History:  Diagnosis Date  . Anxiety   . Anxiety disorder   . Cirrhosis ([place])   . COPD (chronic obstructive pulmonary disease) ([place])    followed by pcp--  last exacerbation 11/ 2017  . Depression  Patient denies  . Gastritis   . History of rib fracture    2004  . 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 Hyperlipidemia   . Hypertension   . OA (osteoarthritis)    back, hips, knees, feet  . Osteoporosis   . Polyp of colon, hyperplastic   . Pulmonary nodules    per CT chest 08-28-2016  -- bilateral upper lobe nodules  . Stroke ([place])   . Tubular adenoma of colon   . Type 2 diabetes  mellitus ([place])    followed by pcp--  last A1c 9.1 on 08-01-2016  . UTI (urinary tract infection)   . Wears dentures    Past Surgical History:  Procedure Laterality Date  . BILATERAL SALPINGOOPHORECTOMY  1997   via Laparoscopy  . KNEE ARTHROSCOPY Right 11/2014  . VAGINAL HYSTERECTOMY  age 66   Social History   Social History  . Marital status: Married    Spouse name: N/A  . Number of children: 1  . Years of education: N/A   Occupational History  . Retired   Social History Main Topics  . Smoking status: Current Every Day Smoker    Packs/day: 0.5    Types: Cigarettes  . Smokeless tobacco: Never Used       . Alcohol use No  . Drug use: No   Current Outpatient Medications on File Prior to Visit  Medication Sig Dispense Refill  . albuterol (PROVENTIL) (2.5 MG/3ML) 0.083% nebulizer solution Take 3 mLs (2.5 mg total) by nebulization every 6 (six) hours as needed for wheezing or shortness of breath. 75 mL 12  . ALPRAZolam (XANAX) 1 MG tablet Take 1/2 to 1 tablet very sparingly only if needed for anxiety attack 90 tablet 0  . aspirin-acetaminophen-caffeine (EXCEDRIN MIGRAINE) 250-250-65 MG tablet Take by mouth every 6 (six) hours as needed for headache.    . budesonide-formoterol (SYMBICORT) 160-4.5 MCG/ACT inhaler Inhale 2 puffs into the lungs 2 (two) times daily. 1 Inhaler 3  . Cholecalciferol (VITAMIN D3) 1000 units CAPS Take 1 capsule by mouth daily.    . colesevelam (WELCHOL) 625 MG tablet Take by mouth every morning.    . DiphenhydrAMINE HCl, Sleep, (SLEEP AID) 50 MG CAPS Take 1 capsule by mouth at bedtime.    . Diphenhydramine-APAP (PERCOGESIC) 12.5-325 MG TABS Take by mouth as needed.    . furosemide (LASIX) 40 MG tablet Take 0.5 tablets (20 mg total) by mouth daily. (Patient taking differently: Take 20 mg by mouth as needed. ) 30 tablet 11  . glimepiride (AMARYL) 4 MG tablet Take 1 tablet (4 mg total) by mouth 2 (two) times daily before lunch and supper. 180 tablet 1  .  Insulin Glargine (LANTUS SOLOSTAR) 100 UNIT/ML Solostar Pen Inject 14 Units into the skin at bedtime. 5 pen 11  . Insulin Pen Needle (CAREFINE PEN NEEDLES) 32G X 4 MM MISC Use 1x a day 100 each 3  . lisinopril (PRINIVIL,ZESTRIL) 40 MG tablet Take 1 tablet (40 mg total) by mouth daily. 90 tablet 3  . rosuvastatin (CRESTOR) 40 MG tablet Take 1/2 to 1 tablet daily or as directed for Cholesterol 30 tablet 5   No current facility-administered medications on file prior to visit.    Allergies  Allergen Reactions  . Metformin And Related     "metformin caused mini-strokes" and ELEVATED BS and also GLYBURIDE, ONGLYZA, ACTOS, INVOKANA caused elevated BS  . Insulins Rash  . Janumet [Sitagliptin-Metformin Hcl] Rash    Shakes/ rash  . Penicillins Rash   Family History  Problem Relation Age of Onset  . Cancer Father        PROSTATE  . Diabetes Father   . Hypertension Father   . Hyperlipidemia Sister   . Heart disease Brother   . Hyperlipidemia Brother   . Hypertension Brother   . Diabetes Brother   . Heart disease Sister   . Hyperlipidemia Sister   . Hypertension Sister   . Diabetes Mother   . Stroke Mother   . Stomach cancer Mother   . Colon cancer Neg Hx   . Esophageal cancer Neg Hx   . Rectal cancer Neg Hx     PE: BP 122/82   Pulse 91   Ht 4' 10"  (1.473 m)   Wt 139 lb (63 kg)   SpO2 96%   BMI 29.05 kg/m  Wt Readings from Last 3 Encounters:  06/04/17 139 lb (63 kg)  04/22/17 138 lb (62.6 kg)  04/06/17 140 lb 8 oz (63.7 kg)   Constitutional: overweight, in NAD Eyes: PERRLA, EOMI, no exophthalmos ENT: moist mucous membranes, no thyromegaly, no cervical lymphadenopathy Cardiovascular: RRR, No MRG Respiratory: CTA B Gastrointestinal: abdomen soft, NT, ND, BS+ Musculoskeletal: no deformities, strength intact in all 4 Skin: moist, warm, no rashes Neurological: no tremor with outstretched hands, DTR normal in all 4  ASSESSMENT: 1. DM2, insulin-dependent, uncontrolled, with  complications - atherosclerosis - reportedly TIAs >> no new events  2 HL  PLAN:  1. Patient with long-standing, uncontrolled DM, with worse control since last visit , despite slight increase in Lantus dose. She is having AP + D, currently under investigation by GI> Her sugars are higher when she is in pain. Also, she has a URI >> taking cold meds >> sugars higher. - However, HbA1c is 8.8% (higher) today, but higher than expected from log >> will check a fructosamine >> if this is also higher, will need to increase Lantus by 2 units - I advised her to:  Patient Instructions  Please continue: - Amaryl 4 mg 2x a day before meals - Welchol 1 capsule a day - Lantus 14 units at bedtime   Please stop at the lab.  We may need to increase the Lantus by 2 units if the fructosamine is also high.  Please return in 3 months with your sugar log.   .  - continue checking sugars at different times of the day - check 1x a day, rotating checks - advised for yearly eye exams >> she did not have one in last 16 years despite multiple promptings - Return to clinic in 3 mo with sugar log   2. HL -  She has significant hyperglycemia and is only on Welchol - Of note, she has cirrhosis and she is followed by Dr. Havery Moros   Office Visit on 06/04/2017  Component Date Value Ref Range Status  . Fructosamine 06/04/2017 353* 190 - 270 umol/L Final  . Hemoglobin A1C 06/04/2017 8.8   Final   HbA1c calculated from the fructosamine is 7.6%, lower than the one measured.  We will go ahead and increase the Lantus by 2 units.  Philemon Kingdom, MD PhD [facility]

## 2017-06-07 LAB — FRUCTOSAMINE: Fructosamine: 353 umol/L — ABNORMAL HIGH (ref 190–270)

## 2017-06-08 ENCOUNTER — Telehealth: Payer: Self-pay

## 2017-06-08 NOTE — Telephone Encounter (Signed)
Called patient and gave lab results. Patient had no questions or concerns.  

## 2017-06-08 NOTE — Telephone Encounter (Signed)
-----   Message from Philemon Kingdom, MD sent at 06/07/2017  5:18 PM EST ----- [name], can you please call pt: HbA1c calculated from the fructosamine is 7.6%, lower than the one measured.  Let's still go ahead and increase the Lantus by 2 units.

## 2017-06-21 ENCOUNTER — Other Ambulatory Visit: Payer: Self-pay | Admitting: Physician Assistant

## 2017-06-21 NOTE — Telephone Encounter (Signed)
Xanax has been called into pharmacy on Nov 26th 2018 by DD

## 2017-06-28 ENCOUNTER — Telehealth: Payer: Self-pay

## 2017-06-28 DIAGNOSIS — K7460 Unspecified cirrhosis of liver: Secondary | ICD-10-CM

## 2017-06-28 DIAGNOSIS — R1012 Left upper quadrant pain: Principal | ICD-10-CM

## 2017-06-28 DIAGNOSIS — G8929 Other chronic pain: Secondary | ICD-10-CM

## 2017-06-28 NOTE — Telephone Encounter (Signed)
Called pt. No answer and no VM.

## 2017-06-28 NOTE — Telephone Encounter (Signed)
-----   Message from Roetta Sessions, [place] sent at 04/06/2017  2:31 PM EDT ----- Regarding: schedule pt for repeat Ultrasoud Pt in office on 04-06-17.  Dr. Havery Moros ordered repeat ultrasound and labs in December.  The labs have been entered: cbc, Cmet, PT/INR and AFP but need to put in order for ultrasound and call Radiology and pt to schedule.  Remind pt to do labs around the same time 07-06-17.

## 2017-06-28 NOTE — Telephone Encounter (Signed)
Called and LM for pt to call back.

## 2017-06-29 NOTE — Telephone Encounter (Signed)
Patient called. She is due for 3rd Twinrix injection in December. I made an appt for her for Dec 14th at 9:45am. We discussed the need for a repeat U/S and labs.  She said she would prefer to do it all on Dec. 14th if at all possible since she lives a distance a way. Called Radiology, scheduled U/S for Dec.14th at 10:30am. Pt needs to arrive at 10:15am.  NPO 6 hours prior. Pt knows to go to the lab afterwards to have lab work done.  Furthermore, the pt indicated she is still having terrible LUQ pain that comes and goes. When it comes it is very sharp and her abdomin swells and the pain sometimes doubles her over. She carries depends diapers and wipes with her b/c she never knows when the bouts of diarrhea will come. I mentioned that she had spoken to [facility] in October and they had discussed doing a C-diff stool test but I see that if was never done.  She indicated she may pick up the supplies when she is at the lab to have on hand for when the diarrhea returns.

## 2017-06-29 NOTE — Telephone Encounter (Signed)
Agree with C Dif stool test of loose stools persist She has had CT scan, EGD, and colonoscopy for her pain - no cause seen, as per my last note I suspect she has musculoskeletal pain, we discussed options at her last visit. She declined oral regimen of medications and trigger point injection at the last visit, preferred to use Capsaicin. She can see me in clinic for reassessment if this persists.  Will await [place], thanks for scheduling.

## 2017-07-09 ENCOUNTER — Ambulatory Visit (INDEPENDENT_AMBULATORY_CARE_PROVIDER_SITE_OTHER): Payer: PPO | Admitting: Gastroenterology

## 2017-07-09 ENCOUNTER — Other Ambulatory Visit (INDEPENDENT_AMBULATORY_CARE_PROVIDER_SITE_OTHER): Payer: PPO

## 2017-07-09 ENCOUNTER — Ambulatory Visit (HOSPITAL_COMMUNITY)
Admission: RE | Admit: 2017-07-09 | Discharge: 2017-07-09 | Disposition: A | Discharge status: Home / Self Care | Payer: PPO | Source: Ambulatory Visit | Attending: Gastroenterology | Admitting: Gastroenterology

## 2017-07-09 DIAGNOSIS — Z23 Encounter for immunization: Secondary | ICD-10-CM

## 2017-07-09 DIAGNOSIS — G8929 Other chronic pain: Secondary | ICD-10-CM | POA: Diagnosis not present

## 2017-07-09 DIAGNOSIS — D71 Functional disorders of polymorphonuclear neutrophils: Secondary | ICD-10-CM | POA: Diagnosis not present

## 2017-07-09 DIAGNOSIS — K7460 Unspecified cirrhosis of liver: Secondary | ICD-10-CM

## 2017-07-09 DIAGNOSIS — N281 Cyst of kidney, acquired: Secondary | ICD-10-CM | POA: Insufficient documentation

## 2017-07-09 DIAGNOSIS — R1012 Left upper quadrant pain: Secondary | ICD-10-CM | POA: Diagnosis not present

## 2017-07-09 LAB — COMPREHENSIVE METABOLIC PANEL
ALBUMIN: 3.8 g/dL (ref 3.5–5.2)
ALK PHOS: 113 U/L (ref 39–117)
ALT: 23 U/L (ref 0–35)
AST: 22 U/L (ref 0–37)
BILIRUBIN TOTAL: 0.5 mg/dL (ref 0.2–1.2)
BUN: 11 mg/dL (ref 6–23)
CALCIUM: 9.3 mg/dL (ref 8.4–10.5)
CO2: 31 meq/L (ref 19–32)
CREATININE: 0.74 mg/dL (ref 0.40–1.20)
Chloride: 95 mEq/L — ABNORMAL LOW (ref 96–112)
GFR: 83.39 mL/min (ref >60.00)
Glucose, Bld: 109 mg/dL — ABNORMAL HIGH (ref 70–99)
Potassium: 4.4 mEq/L (ref 3.5–5.1)
Sodium: 130 mEq/L — ABNORMAL LOW (ref 135–145)
TOTAL PROTEIN: 7.4 g/dL (ref 6.0–8.3)

## 2017-07-09 LAB — CBC WITH DIFFERENTIAL/PLATELET
Basophils Absolute: 0.1 10*3/uL (ref 0.0–0.1)
Basophils Relative: 1 % (ref 0.0–3.0)
EOS ABS: 0.6 10*3/uL (ref 0.0–0.7)
Eosinophils Relative: 6.1 % — ABNORMAL HIGH (ref 0.0–5.0)
HEMATOCRIT: 43 % (ref 36.0–46.0)
Hemoglobin: 14.8 g/dL (ref 12.0–15.0)
LYMPHS ABS: 3 10*3/uL (ref 0.7–4.0)
LYMPHS PCT: 29 % (ref 12.0–46.0)
MCHC: 34.5 g/dL (ref 30.0–36.0)
MCV: 88.1 fl (ref 78.0–100.0)
MONOS PCT: 7.6 % (ref 3.0–12.0)
Monocytes Absolute: 0.8 10*3/uL (ref 0.1–1.0)
NEUTROS ABS: 5.8 10*3/uL (ref 1.4–7.7)
NEUTROS PCT: 56.3 % (ref 43.0–77.0)
PLATELETS: 293 10*3/uL (ref 150.0–400.0)
RBC: 4.88 Mil/uL (ref 3.87–5.11)
RDW: 12.8 % (ref 11.5–15.5)
WBC: 10.3 10*3/uL (ref 4.0–10.5)

## 2017-07-09 LAB — PROTIME-INR
INR: 1 ratio (ref 0.8–1.0)
Prothrombin Time: 11 s (ref 9.6–13.1)

## 2017-07-12 LAB — AFP TUMOR MARKER: AFP-Tumor Marker: 8.4 ng/mL — ABNORMAL HIGH

## 2017-07-13 ENCOUNTER — Other Ambulatory Visit: Payer: Self-pay

## 2017-07-13 ENCOUNTER — Telehealth: Payer: Self-pay | Admitting: Gastroenterology

## 2017-07-13 DIAGNOSIS — K7460 Unspecified cirrhosis of liver: Secondary | ICD-10-CM

## 2017-07-13 DIAGNOSIS — K7581 Nonalcoholic steatohepatitis (NASH): Principal | ICD-10-CM

## 2017-07-13 DIAGNOSIS — E871 Hypo-osmolality and hyponatremia: Secondary | ICD-10-CM

## 2017-07-13 MED ORDER — GABAPENTIN 300 MG PO CAPS: 300.0000 mg | ORAL_CAPSULE | Freq: Every day | ORAL | 1 refills | Status: DC

## 2017-07-13 NOTE — Telephone Encounter (Signed)
See result note.  

## 2017-07-26 ENCOUNTER — Encounter: Payer: Self-pay | Admitting: Internal Medicine

## 2017-07-26 ENCOUNTER — Ambulatory Visit: Payer: PPO | Admitting: Physician Assistant

## 2017-07-26 VITALS — BP 140/80 | HR 99 | Temp 97.9°F | Ht <= 58 in | Wt 140.0 lb

## 2017-07-26 DIAGNOSIS — H60392 Other infective otitis externa, left ear: Secondary | ICD-10-CM | POA: Diagnosis not present

## 2017-07-26 MED ORDER — AZITHROMYCIN 250 MG PO TABS: ORAL_TABLET | ORAL | 1 refills | Status: AC

## 2017-07-26 MED ORDER — NEOMYCIN-POLYMYXIN-HC 1 % OT SOLN: 3.0000 [drp] | Freq: Four times a day (QID) | OTIC | 0 refills | Status: DC

## 2017-07-26 NOTE — Patient Instructions (Addendum)
Can do heating pad or warm up moist towel and put on that ear Can do cap of peroxide in that ear once a day Take the antibiotic   Otitis Externa Otitis externa is an infection of the outer ear canal. The outer ear canal is the area between the outside of the ear and the eardrum. Otitis externa is sometimes called "swimmer's ear." Follow these instructions at home:  If you were given antibiotic ear drops, use them as told by your doctor. Do not stop using them even if your condition gets better.  Take over-the-counter and prescription medicines only as told by your doctor.  Keep all follow-up visits as told by your doctor. This is important. How is this prevented?  Keep your ear dry. Use the corner of a towel to dry your ear after you swim or bathe.  Try not to scratch or put things in your ear. Doing these things makes it easier for germs to grow in your ear.  Avoid swimming in lakes, dirty water, or pools that may not have the right amount of a chemical called chlorine.  Consider making ear drops and putting 3 or 4 drops in each ear after you swim. Ask your doctor about how you can make ear drops. Contact a doctor if:  You have a fever.  After 3 days your ear is still red, swollen, or painful.  After 3 days you still have pus coming from your ear.  Your redness, swelling, or pain gets worse.  You have a really bad headache.  You have redness, swelling, pain, or tenderness behind your ear. This information is not intended to replace advice given to you by your health care provider. Make sure you discuss any questions you have with your health care provider. Document Released: 12/30/2007 Document Revised: 08/08/2015 Document Reviewed: 04/22/2015 Elsevier Interactive Patient Education  [employer].

## 2017-07-26 NOTE — Progress Notes (Signed)
Subjective:    Patient ID: Sara Lamb, female    DOB: Jun 27, 1951, 66 y.o.   MRN: [id number]  HPI 66 y.o. diabetic,smoking WF presents with left ear pain x 5days.  No fever, chills, no sinus issues. She has some HA.  Has tried smoke, swimmer's ear, and sweet oil with no relief.   Blood pressure 140/80, pulse 99, temperature 97.9 F (36.6 C), height 4' 10"  (1.473 m), weight 140 lb (63.5 kg), SpO2 97 %.  Medications Current Outpatient Medications on File Prior to Visit  Medication Sig  . albuterol (PROVENTIL) (2.5 MG/3ML) 0.083% nebulizer solution Take 3 mLs (2.5 mg total) by nebulization every 6 (six) hours as needed for wheezing or shortness of breath.  . ALPRAZolam (XANAX) 1 MG tablet TAKE ONE TABLET BY MOUTH THREE TIMES DAILY AS NEEDED  . aspirin-acetaminophen-caffeine (EXCEDRIN MIGRAINE) 250-250-65 MG tablet Take by mouth every 6 (six) hours as needed for headache.  . budesonide-formoterol (SYMBICORT) 160-4.5 MCG/ACT inhaler Inhale 2 puffs into the lungs 2 (two) times daily.  . DiphenhydrAMINE HCl, Sleep, (SLEEP AID) 50 MG CAPS Take 1 capsule by mouth at bedtime.  . Diphenhydramine-APAP (PERCOGESIC) 12.5-325 MG TABS Take by mouth as needed.  . furosemide (LASIX) 40 MG tablet Take 0.5 tablets (20 mg total) by mouth daily. (Patient taking differently: Take 20 mg by mouth daily. )  . glimepiride (AMARYL) 4 MG tablet Take 1 tablet (4 mg total) by mouth 2 (two) times daily before lunch and supper.  . Insulin Glargine (LANTUS SOLOSTAR) 100 UNIT/ML Solostar Pen Inject 14 Units into the skin at bedtime.  . Insulin Pen Needle (CAREFINE PEN NEEDLES) 32G X 4 MM MISC Use 1x a day  . lisinopril (PRINIVIL,ZESTRIL) 40 MG tablet Take 1 tablet (40 mg total) by mouth daily.  . Cholecalciferol (VITAMIN D3) 1000 units CAPS Take 1 capsule by mouth daily.  . colesevelam (WELCHOL) 625 MG tablet Take by mouth every morning.  . gabapentin (NEURONTIN) 300 MG capsule Take 1 capsule (300 mg total) by mouth  at bedtime. (Patient not taking: Reported on 07/26/2017)  . rosuvastatin (CRESTOR) 40 MG tablet Take 1/2 to 1 tablet daily or as directed for Cholesterol (Patient not taking: Reported on 07/26/2017)   No current facility-administered medications on file prior to visit.     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   Review of Systems  Constitutional: Negative.   HENT: Positive for ear pain. Negative for congestion, dental problem, drooling, ear discharge, facial swelling, hearing loss, mouth sores, nosebleeds, postnasal drip, rhinorrhea, sinus pressure, sinus pain and sneezing.   Respiratory: Negative.   Cardiovascular: Negative.   Gastrointestinal: Negative.   Genitourinary: Negative.   Musculoskeletal: Negative.   Skin: Negative.   Neurological: Negative.   Hematological: Negative.   Psychiatric/Behavioral: Negative.        Objective:   Physical Exam  Constitutional: She is oriented to person, place, and time. She appears well-developed and well-nourished.  HENT:  Head: Normocephalic and atraumatic.  Right Ear: External ear normal.  Left Ear: No lacerations. There is swelling (swelling of canal with nodule/abscess at 8 oclock. ) and tenderness. No drainage. No foreign bodies. No mastoid tenderness. Tympanic membrane is not injected,  not scarred, not perforated, not erythematous, not retracted and not bulging. Tympanic membrane mobility is normal.  No middle ear effusion. No hemotympanum. No decreased hearing is noted.  Nose: Nose normal.  Mouth/Throat:  Oropharynx is clear and moist.  Eyes: Conjunctivae are normal. Pupils are equal, round, and reactive to light.  Neck: Normal range of motion. Neck supple.  Cardiovascular: Normal rate and regular rhythm.  Pulmonary/Chest: Effort normal. No respiratory distress. She has no wheezes. She has no rales. She exhibits no tenderness.  Abdominal: Soft. Bowel sounds are normal.  Lymphadenopathy:    She has no cervical adenopathy.  Neurological: She is alert and oriented to person, place, and time.  Skin: Skin is warm and dry.      Assessment & Plan:   Other infective acute otitis externa of left ear -     azithromycin (ZITHROMAX) 250 MG tablet; Take 2 tablets (500 mg) on  Day 1,  followed by 1 tablet (250 mg) once daily on Days 2 through 5. -     NEOMYCIN-POLYMYXIN-HYDROCORTISONE (CORTISPORIN) 1 % SOLN OTIC solution; Place 3 drops into both ears 4 (four) times daily.  Future Appointments  Date Time Provider [place]  08/16/2017  2:00 PM Vicie Mutters, PA-C [id number] None  09/03/2017  2:00 PM Philemon Kingdom, MD [id number] None

## 2017-07-28 ENCOUNTER — Ambulatory Visit: Payer: Self-pay | Admitting: Internal Medicine

## 2017-08-05 ENCOUNTER — Other Ambulatory Visit: Payer: Self-pay | Admitting: Physician Assistant

## 2017-08-13 NOTE — Progress Notes (Signed)
Medicare wellness and 3 MONTH  Assessment:    Essential hypertension - continue medications, DASH diet, exercise and monitor at home. Call if greater than 130/80.  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 Advised to quit smoking - Hemoglobin A1c  Chronic obstructive pulmonary disease, unspecified COPD type ([place]) Advised to quit smoking.   T2_NIDDM, poorly controlled Discussed general issues about diabetes pathophysiology and management., Educational material distributed., Suggested low cholesterol diet., Encouraged aerobic exercise., Discussed foot care., Reminded to get yearly retinal exam. - Hemoglobin G4W   Nonalcoholic hepatosteatosis Weight loss advised, monitor sugars.  - Hepatic function panel  Atherosclerosis of abdominal aorta (HCC) Control blood pressure, cholesterol, glucose, increase exercise.  Advised to stop smoking - Lipid panel - Hemoglobin A1c   Hyperlipidemia, unspecified hyperlipidemia type Not willing to get on medication, will check cholesterol and attempt - Lipid panel  Tobacco use disorder Smoking cessation-  instruction/counseling given, counseled patient on the dangers of tobacco use, advised patient to stop smoking, and reviewed strategies to maximize success, continue chantix    Vitamin D deficiency Continue supplement   Medication management - Magnesium   Anxiety  Poor compliance with medication LONG discussion about compliance, needs to stay on medication  Osteoporosis, unspecified osteoporosis type, unspecified pathological fracture presence Has had DEXA, declines treatment  Encounter for  general adult medical examination with abnormal findings  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 SEE EYE DOCTOR  Liver cirrhosis secondary to NASH ([place]) -     Hepatic function panel  BMI 29.0-29.9,adult  Overweight  - long discussion about weight loss, diet, and exercise -recommended diet heavy in fruits and veggies and low in animal meats, cheeses, and dairy products  Other orders -     cyclobenzaprine (FLEXERIL) 10 MG tablet; Take 1 tablet (10 mg total) by mouth at bedtime as needed for muscle spasms.    Over 40 minutes of exam, counseling, chart review and critical decision making was performed Future Appointments  Date Time Provider [place]  09/03/2017  2:00 PM Philemon Kingdom, MD [id number] None  08/16/2018  2:00 PM Vicie Mutters, PA-C [id number] None    Plan:   During the course of the visit the patient was educated and counseled about appropriate screening and preventive services including:    Pneumococcal vaccine   Prevnar 13  Influenza vaccine  Td vaccine  Screening electrocardiogram  Bone densitometry screening  Colorectal cancer screening  Diabetes screening  Glaucoma screening  Nutrition counseling   Advanced directives: requested     Subjective:  Sara Lamb is a 67 y.o. female who presents for wellness visit (8) and 3 month follow up for uncontrolled DM.   She is very noncompliant with test, vaccines, and has an aversion to medication with " reactions" to the majority of medications.  She has cirrhosis due to NASH, has RUQ pain, following with GI, can not tolerate gabpatenin, she took her husbands flexeril and states this helped.    Her blood pressure has not been controlled at  home, today their BP is BP: 136/86  She does not workout. She denies chest pain, shortness of breath, dizziness. She has COPD, continues to smoke.   She is not on cholesterol medication . Her cholesterol is not at goal. The cholesterol last visit was:  She is suppose to be on crestor but she has a reaction and could not.  Lab Results  Component Value Date   CHOL 232 (H) 03/23/2017   HDL 46 (L) 03/23/2017   LDLCALC 163 (H) 11/23/2016   TRIG 238 (H) 03/23/2017   CHOLHDL 5.0 (H) 03/23/2017   She has not been working on diet and exercise for Diabetes with diabetic chronic kidney disease, she  she is not on bASA states she can take it due to fatigue, she is on amaryl 4 mg BID and welchol 1 capsules a day, she is on lantus 14 units a day, She states she has cut out all carbs. , she is on ACE/ARB, and denies polydipsia, polyuria and visual disturbances. Last A1C was:  Lab Results  Component Value Date   HGBA1C 8.8 06/04/2017   Last GFR: Lab Results  Component Value Date   GFRNONAA 76 04/22/2017   Patient is on Vitamin D supplement.   Lab Results  Component Value Date   VD25OH 14 (L) 03/23/2017     BMI is Body mass index is 29.26 kg/m., she is working on diet and exercise. Wt Readings from Last 3 Encounters:  08/16/17 140 lb (63.5 kg)  07/26/17 140 lb (63.5 kg)  06/04/17 139 lb (63 kg)     Medication Review: Current Outpatient Medications on File Prior to Visit  Medication Sig  . albuterol (PROVENTIL) (2.5 MG/3ML) 0.083% nebulizer solution Take 3 mLs (2.5 mg total) by nebulization every 6 (six) hours as needed for wheezing or shortness of breath.  . ALPRAZolam (XANAX) 1 MG tablet Take 1 tablet (1 mg total) by mouth 3 (three) times daily as needed (TRY TO CUT BACK).  Marland Kitchen aspirin-acetaminophen-caffeine (EXCEDRIN MIGRAINE) 250-250-65 MG tablet Take by mouth every 6 (six) hours as needed for headache.  . budesonide-formoterol (SYMBICORT) 160-4.5 MCG/ACT inhaler Inhale 2 puffs into the  lungs 2 (two) times daily.  . Cholecalciferol (VITAMIN D3) 1000 units CAPS Take 1 capsule by mouth daily.  . colesevelam (WELCHOL) 625 MG tablet Take by mouth every morning.  . DiphenhydrAMINE HCl, Sleep, (SLEEP AID) 50 MG CAPS Take 1 capsule by mouth at bedtime.  . Diphenhydramine-APAP (PERCOGESIC) 12.5-325 MG TABS Take by mouth as needed.  Marland Kitchen glimepiride (AMARYL) 4 MG tablet Take 1 tablet (4 mg total) by mouth 2 (two) times daily before lunch and supper.  . Insulin Glargine (LANTUS SOLOSTAR) 100 UNIT/ML Solostar Pen Inject 14 Units into the skin at bedtime.  . Insulin Pen Needle (CAREFINE PEN NEEDLES) 32G X 4 MM MISC Use 1x a day  . lisinopril (PRINIVIL,ZESTRIL) 40 MG tablet Take 1 tablet (40 mg total) by mouth daily.  . NEOMYCIN-POLYMYXIN-HYDROCORTISONE (CORTISPORIN) 1 % SOLN OTIC solution Place 3 drops into both ears 4 (four) times daily.  . furosemide (LASIX) 40 MG tablet Take 0.5 tablets (20 mg total) by mouth daily. (Patient taking differently: Take 20 mg by mouth daily. )   No current facility-administered medications on file prior to visit.      Allergies  Allergen Reactions  . Metformin And Related     "metformin caused mini-strokes" and ELEVATED BS and also GLYBURIDE, ONGLYZA, ACTOS, INVOKANA caused elevated BS  . Insulins Rash  . Janumet [Sitagliptin-Metformin Hcl] Rash    Shakes/  rash  . Penicillins Rash    Current Problems (verified) Patient Active Problem List   Diagnosis Date Noted  . Cognitive dysfunction 03/27/2017  . Liver cirrhosis secondary to NASH ([place]) 11/23/2016  . Osteoporosis 05/27/2016  . Atherosclerosis of abdominal aorta ([place]) 04/30/2016  . Type 2 diabetes mellitus with atherosclerosis of aorta ([place]) 04/30/2015  . COPD (chronic obstructive pulmonary disease) ([place]) 04/30/2015  . Tobacco use disorder 04/08/2015  . Poor compliance with medication 11/05/2014  . Medication management 12/28/2013  . Nonalcoholic hepatosteatosis   . Vitamin D deficiency   .  Anxiety   . Hypertension   . Hyperlipidemia, mixed     Screening Tests Immunization History  Administered Date(s) Administered  . Hep A / Hep B 01/07/2017, 02/08/2017, 07/09/2017  . Td 06/26/2001  . Tdap 04/30/2015   Preventative care: Last colonoscopy: UTD Last mammogram: 05/25/2016 due 2019 Last pap smear/pelvic exam: declines  DEXA: 04/2016 MRI brain 2016 CXR 04/2016 bronchitis  Prior vaccinations: DECLINES ALL VACCINES TD or Tdap:2016  Influenza: Declines  Pneumococcal: declines Prevnar13: declines Shingles/Zostavax: declines  Names of Other Physician/Practitioners you currently use: 1. [facility] here for primary care 2. none, eye doctor, encouraged to see eye doctor 3. Dentures dentist Patient Care Team: Unk Pinto, MD as PCP - General (Internal Medicine) Kinsinger, Arta Bruce, MD as Consulting Physician (General Surgery) Philemon Kingdom, MD as Consulting Physician (Internal Medicine)  SURGICAL HISTORY She  has a past surgical history that includes Vaginal hysterectomy (age 71); Bilateral salpingoophorectomy (1997); Knee arthroscopy (Right, 11/2014); and Cholecystectomy (N/A, 09/30/2016). FAMILY HISTORY Her family history includes Cancer in her father; Diabetes in her brother, father, and mother; Heart disease in her brother and sister; Hyperlipidemia in her brother, sister, and sister; Hypertension in her brother, father, and sister; Stomach cancer in her mother; Stroke in her mother. SOCIAL HISTORY She  reports that she has been smoking cigarettes.  She has a 17.50 pack-year smoking history. she has never used smokeless tobacco. She reports that she does not drink alcohol or use drugs. Does Patient Have a Medical Advance Directive?: Yes Type of Advance Directive: Healthcare Power of Attorney, Living will [place] in Chart?: Yes  MEDICARE WELLNESS OBJECTIVES: Physical activity: Current  Exercise Habits: The patient does not participate in regular exercise at present, Exercise limited by: orthopedic condition(s) Cardiac risk factors: Cardiac Risk Factors include: advanced age (>34mn, >>44women);diabetes mellitus;dyslipidemia;family history of premature cardiovascular disease;hypertension;sedentary lifestyle;smoking/ tobacco exposure Depression/mood screen:   [facility] 08/16/2017  Decreased Interest 0  Down, Depressed, Hopeless 0  PHQ - 2 Score 0    ADLs:  In your present state of health, do you have any difficulty performing the following activities: 08/16/2017 03/27/2017  Hearing? N N  Vision? N N  Difficulty concentrating or making decisions? N N  Walking or climbing stairs? N N  Dressing or bathing? N N  Doing errands, shopping? N N  Some recent data might be hidden    Cognitive Testing  Alert? Yes  Normal Appearance?Yes  Oriented to person? Yes  Place? Yes   Time? Yes  Recall of three objects?  Yes  Can perform simple calculations? Yes  Displays appropriate judgment?Yes  Can read the correct time from a watch face?Yes  EOL planning: Does Patient Have a Medical Advance Directive?: Yes Type of Advance Directive: Healthcare Power of Attorney, [place]    Objective:  Today's Vitals   08/16/17 1349  BP: 136/86  Resp: 16  Temp: (!) 97.5 F (36.4 C)  Weight: 140 lb (63.5 kg)  Height: 4' 10"  (1.473 m)  PainSc: 8   PainLoc: Knee   Body mass index is 29.26 kg/m.  General appearance: alert, no distress, WD/WN, female HEENT: normocephalic, sclerae anicteric, TMs pearly, nares patent, no discharge or erythema, pharynx normal, she has dry cracking erythema at corners of her mouth.  Oral cavity: MMM, no lesions Neck: supple, no lymphadenopathy, no thyromegaly, no masses Heart: RRR, normal S1, S2, holosystolic murmur Lungs: decreased BS, with diffuse wheezing and rhonchi bilateral, no  rales Abdomen: +bs, soft, obese, tender epigastric and RUQ pain to palpation,  no masses, no hepatomegaly, no splenomegaly Musculoskeletal: nontender, no swelling, no obvious deformity Extremities: no edema, no cyanosis, no clubbing Pulses: 2+ symmetric, upper and lower extremities, normal cap refill Neurological: alert, oriented x 3, CN2-12 intact, strength normal upper extremities and lower extremities, sensation normal throughout, DTRs 2+ throughout, no cerebellar signs, gait antalgic Psychiatric: normal affect, behavior normal, pleasant    Medicare Attestation I have personally reviewed: The patient's medical and social history Their use of alcohol, tobacco or illicit drugs Their current medications and supplements The patient's functional ability including ADLs,fall risks, home safety risks, cognitive, and hearing and visual impairment Diet and physical activities Evidence for depression or mood disorders  The patient's weight, height, BMI, and visual acuity have been recorded in the chart.  I have made referrals, counseling, and provided education to the patient based on review of the above and I have provided the patient with a written personalized care plan for preventive services.     Vicie Mutters, PA-C   08/16/2017

## 2017-08-16 ENCOUNTER — Ambulatory Visit (INDEPENDENT_AMBULATORY_CARE_PROVIDER_SITE_OTHER): Payer: PPO | Admitting: Physician Assistant

## 2017-08-16 ENCOUNTER — Encounter: Payer: Self-pay | Admitting: Physician Assistant

## 2017-08-16 VITALS — BP 136/86 | Temp 97.5°F | Resp 16 | Ht <= 58 in | Wt 140.0 lb

## 2017-08-16 DIAGNOSIS — K7581 Nonalcoholic steatohepatitis (NASH): Secondary | ICD-10-CM

## 2017-08-16 DIAGNOSIS — J449 Chronic obstructive pulmonary disease, unspecified: Secondary | ICD-10-CM | POA: Diagnosis not present

## 2017-08-16 DIAGNOSIS — I10 Essential (primary) hypertension: Secondary | ICD-10-CM | POA: Diagnosis not present

## 2017-08-16 DIAGNOSIS — Z6829 Body mass index (BMI) 29.0-29.9, adult: Secondary | ICD-10-CM

## 2017-08-16 DIAGNOSIS — Z0000 Encounter for general adult medical examination without abnormal findings: Secondary | ICD-10-CM

## 2017-08-16 DIAGNOSIS — F419 Anxiety disorder, unspecified: Secondary | ICD-10-CM

## 2017-08-16 DIAGNOSIS — F17200 Nicotine dependence, unspecified, uncomplicated: Secondary | ICD-10-CM

## 2017-08-16 DIAGNOSIS — K7460 Unspecified cirrhosis of liver: Secondary | ICD-10-CM | POA: Diagnosis not present

## 2017-08-16 DIAGNOSIS — Z79899 Other long term (current) drug therapy: Secondary | ICD-10-CM | POA: Diagnosis not present

## 2017-08-16 DIAGNOSIS — I700 Atherosclerosis of aorta: Secondary | ICD-10-CM

## 2017-08-16 DIAGNOSIS — E782 Mixed hyperlipidemia: Secondary | ICD-10-CM

## 2017-08-16 DIAGNOSIS — E559 Vitamin D deficiency, unspecified: Secondary | ICD-10-CM | POA: Diagnosis not present

## 2017-08-16 DIAGNOSIS — Z0001 Encounter for general adult medical examination with abnormal findings: Secondary | ICD-10-CM

## 2017-08-16 DIAGNOSIS — K760 Fatty (change of) liver, not elsewhere classified: Secondary | ICD-10-CM

## 2017-08-16 DIAGNOSIS — Z91148 Patient's other noncompliance with medication regimen for other reason: Secondary | ICD-10-CM

## 2017-08-16 DIAGNOSIS — M810 Age-related osteoporosis without current pathological fracture: Secondary | ICD-10-CM

## 2017-08-16 DIAGNOSIS — R6889 Other general symptoms and signs: Secondary | ICD-10-CM | POA: Diagnosis not present

## 2017-08-16 DIAGNOSIS — E1159 Type 2 diabetes mellitus with other circulatory complications: Secondary | ICD-10-CM

## 2017-08-16 DIAGNOSIS — E1151 Type 2 diabetes mellitus with diabetic peripheral angiopathy without gangrene: Secondary | ICD-10-CM | POA: Diagnosis not present

## 2017-08-16 DIAGNOSIS — Z9114 Patient's other noncompliance with medication regimen: Secondary | ICD-10-CM

## 2017-08-16 MED ORDER — CYCLOBENZAPRINE HCL 10 MG PO TABS: 10.0000 mg | ORAL_TABLET | Freq: Every evening | ORAL | 0 refills | Status: DC | PRN

## 2017-08-16 NOTE — Patient Instructions (Addendum)
Please see eye doctor, very important  Can take flexeril as needed for your stomach/back pain  Restart crestor but 1/2 pill 2 days a week in 2 weeks.   Put vaseline on your ear x 2 weeks and if not better we may need to do a biopsy  Intermittent fasting is more about strategy than starvation. It's meant to reset your body in different ways, hopefully with fitness and nutrition changes as a result.  Like any big switchover, though, results may vary when it comes down to the individual level. What works for your friends may not work for you, or vice versa. That's why it's helpful to play around with variations on intermittent fasting and healthy habits and find what works best for you.  WHAT IS INTERMITTENT FASTING AND WHY DO IT?  Intermittent fasting doesn't involve specific foods, but rather, a strict schedule regarding when you eat. Also called "time-restricted eating," the tactic has been praised for its contribution to weight loss, improved body composition, and decreased cravings. Preliminary research also suggests it may be beneficial for glucose tolerance, hormone regulation, better muscle mass and lower body fat.  Part of its appeal is the simplicity of the effort. Unlike some other trends, there's no calculations to intermittent fasting.  You simply eat within a certain block of time, usually a window of 8-10 hours. In the other big block of time - about 14-16 hours, including when you're asleep - you don't eat anything, not even snacks. You can drink water, coffee, tea or any other beverage that doesn't have calories.  For example, if you like having a late dinner, you might skip breakfast and have your first meal at noon and your last meal of the day at 8 p.m., and then not eat until noon again the next day.  IDEAS FOR GETTING STARTED  If you're new to the strategy, it may be helpful to eat within the typical circadian rhythm and keep eating within daylight hours. This can be  especially beneficial if you're looking at intermittent fasting for weight-loss goals.  So first try only eating between 12pm to 8pm.  Outside of this time you may have water, black coffee, and hot tea. You may not eat it drink anything that has carbs, sugars, OR artificial sugars like diet soda.   Like any major eating and fitness shift, it can take time to find the perfect fit, so don't be afraid to experiment with different options - including ditching intermittent fasting altogether if it's simply not for you. But if it is, you may be surprised by some of the benefits that come along with the strategy.  8 Critical Weight-Loss Tips That Aren't Diet and Exercise  1. STARVE THE DISTRACTIONS  All too often when we eat, we're also multitasking: watching TV, answering emails, scrolling through social media. These habits are detrimental to having a strong, clear, healthy relationship with food, and they can hinder our ability to make dietary changes.  In order to truly focus on what you're eating, how much you're eating, why you're eating those specific foods and, most importantly, how those foods make you feel, you need to starve the distractions. That means when you eat, just eat. Focus on your food, the process it went through to end up on your plate, where it came from and how it nourishes you. With this technique, you're more likely to finish a meal feeling satiated.  2.  CONSIDER WHAT YOU'RE NOT WILLING TO DO  This might  sound counterintuitive, but it can help provide a "why" when motivation is waning. Declare, in writing, what you are unwilling to do, for example "I am unwilling to be the old dad who cannot play sports with my children".  So consider what you're not willing to accept, write it down, and keep it at the ready.  3.  STOP LABELING FOOD "GOOD" AND "BAD"  You've probably heard someone say they ate something "bad." Maybe you've even said it yourself.  The trouble with 'bad'  foods isn't that they'll send you to the grave after a bite or two. The trouble comes when we eat excessive portions of really calorie-dense foods meal after meal, day after day.  Instead of labeling foods as good or bad, think about which foods you can eat a lot of, and which ones you should just eat a little of. Then, plan ways to eat the foods you really like in portions that fit with your overall goals. A good example of this would be having a slice of pizza alongside a club salad with chicken breast, avocado and a bit of dressing. This is vastly different than 3 slices of pizza, 4 breadsticks with cheese sauce and half of a liter of regular soda.  4.  BRUSH YOUR TEETH AFTER YOU EAT  Getting your mindset in order is important, but sometimes small habits can make a big difference. After eating, you still have the taste of food in their mouth, which often causes people to eat more even if they are full or engage in a nibble or two of dessert.  Brushing your teeth will remove the taste of food from your mouth, and the clean, minty freshness will serve as a cue that mealtime is over.  5.  FOCUS ON CROWDING NOT CUTTING  The most common first step during 'dieting' is to cut. We cut our portion sizes down, we cut out 'bad' foods, we cut out entire food groups. This act of cutting puts [place] and our minds into scarcity mode.  When something is off-limits, even if you're able to avoid it for a while, you could end up bingeing on it later because you've gone so long without it. So, instead of cutting, focus on crowding. If you crowd your plate and fill it up with more foods like veggies and protein, it simply allows less room for the other stuff. In other words, shift your focus away from what you can't eat, and celebrate the foods that will help you reach your goals.  6.  TAKE TRACKING A STEP FURTHER  Track what you eat, when you ate it, how much you ate and how that food made you feel. Being completely  honest with yourself and writing down every single thing that passes through your lips will help you start to notice that maybe you actually do snack, possibly take in more sugar than you thought, eat when you're bored rather than just hungry or maybe that you have a habit of snacking before bed while watching TV.  The difference from simply tracking your food intake is you're taking into account how food makes you feel, as well as what you're doing while you're eating. This is about becoming more mindful of what, when and why you eat.  7.  PRIORITIZE GOOD SLEEP  One of the strongest risk factors for being overweight is poor sleep. When you're feeling tired, you're more likely to choose unhealthy comfort foods and to skip your workout. Additionally, sleep deprivation  may slow down your metabolism. [employer]! Therefore, sleeping 7-8 hours per night can help with weight loss without having to change your diet or increase your physical activity. And if you feel you snore and still wake up tired, talk with me about sleep apnea.  8.  SET ASIDE TIME TO DISCONNECT  Just get out there. Disconnect from the electronics and connect to the elements. Not only will this help reduce stress (a major factor in weight gain) by giving your mind a break from the constant stimulation we've all become so accustomed to, but it may also reprogram your brain to connect with yourself and what you're feeling.

## 2017-08-17 LAB — HEPATIC FUNCTION PANEL
AG Ratio: 1.2 (calc) (ref 1.0–2.5)
ALT: 27 U/L (ref 6–29)
AST: 29 U/L (ref 10–35)
Albumin: 3.9 g/dL (ref 3.6–5.1)
Alkaline phosphatase (APISO): 122 U/L (ref 33–130)
Bilirubin, Direct: 0.1 mg/dL (ref 0.0–0.2)
GLOBULIN: 3.3 g/dL (ref 1.9–3.7)
Indirect Bilirubin: 0.2 mg/dL (calc) (ref 0.2–1.2)
TOTAL PROTEIN: 7.2 g/dL (ref 6.1–8.1)
Total Bilirubin: 0.3 mg/dL (ref 0.2–1.2)

## 2017-08-17 LAB — CBC WITH DIFFERENTIAL/PLATELET
BASOS PCT: 1.4 %
Basophils Absolute: 144 cells/uL (ref 0–200)
EOS PCT: 5.5 %
Eosinophils Absolute: 567 cells/uL — ABNORMAL HIGH (ref 15–500)
HCT: 42.4 % (ref 35.0–45.0)
Hemoglobin: 14.5 g/dL (ref 11.7–15.5)
Lymphs Abs: 2678 cells/uL (ref 850–3900)
MCH: 29.8 pg (ref 27.0–33.0)
MCHC: 34.2 g/dL (ref 32.0–36.0)
MCV: 87.1 fL (ref 80.0–100.0)
MPV: 10.3 fL (ref 7.5–12.5)
Monocytes Relative: 6 %
Neutro Abs: 6293 cells/uL (ref 1500–7800)
Neutrophils Relative %: 61.1 %
PLATELETS: 292 10*3/uL (ref 140–400)
RBC: 4.87 10*6/uL (ref 3.80–5.10)
RDW: 11.8 % (ref 11.0–15.0)
TOTAL LYMPHOCYTE: 26 %
WBC: 10.3 10*3/uL (ref 3.8–10.8)
WBCMIX: 618 {cells}/uL (ref 200–950)

## 2017-08-17 LAB — LIPID PANEL
CHOL/HDL RATIO: 3.1 (calc) (ref <5.0)
CHOLESTEROL: 155 mg/dL (ref <200)
HDL: 50 mg/dL — AB (ref >50)
LDL Cholesterol (Calc): 72 mg/dL (calc)
Non-HDL Cholesterol (Calc): 105 mg/dL (calc) (ref <130)
Triglycerides: 249 mg/dL — ABNORMAL HIGH (ref <150)

## 2017-08-17 LAB — BASIC METABOLIC PANEL WITH GFR
BUN: 10 mg/dL (ref 7–25)
CALCIUM: 9.8 mg/dL (ref 8.6–10.4)
CHLORIDE: 93 mmol/L — AB (ref 98–110)
CO2: 27 mmol/L (ref 20–32)
Creat: 0.77 mg/dL (ref 0.50–0.99)
GFR, Est African American: 93 mL/min/{1.73_m2} (ref >=60)
GFR, Est Non African American: 80 mL/min/{1.73_m2} (ref >=60)
Glucose, Bld: 317 mg/dL — ABNORMAL HIGH (ref 65–99)
Potassium: 4.9 mmol/L (ref 3.5–5.3)
Sodium: 130 mmol/L — ABNORMAL LOW (ref 135–146)

## 2017-08-17 LAB — TSH: TSH: 0.92 mIU/L (ref 0.40–4.50)

## 2017-09-03 ENCOUNTER — Ambulatory Visit: Payer: PPO | Admitting: Internal Medicine

## 2017-09-03 ENCOUNTER — Encounter: Payer: Self-pay | Admitting: Internal Medicine

## 2017-09-03 VITALS — BP 132/74 | HR 94 | Ht <= 58 in | Wt 141.0 lb

## 2017-09-03 DIAGNOSIS — E1151 Type 2 diabetes mellitus with diabetic peripheral angiopathy without gangrene: Secondary | ICD-10-CM | POA: Diagnosis not present

## 2017-09-03 DIAGNOSIS — I700 Atherosclerosis of aorta: Secondary | ICD-10-CM | POA: Diagnosis not present

## 2017-09-03 DIAGNOSIS — E782 Mixed hyperlipidemia: Secondary | ICD-10-CM | POA: Diagnosis not present

## 2017-09-03 LAB — POCT GLYCOSYLATED HEMOGLOBIN (HGB A1C): Hemoglobin A1C: 9.3

## 2017-09-03 MED ORDER — INSULIN GLARGINE 100 UNIT/ML SOLOSTAR PEN: 16.0000 [IU] | PEN_INJECTOR | Freq: Every day | SUBCUTANEOUS | 11 refills | Status: DC

## 2017-09-03 NOTE — Patient Instructions (Addendum)
Please continue: - Amaryl 4 mg 2x a day before meals - Welchol 1 capsule a day - Lantus 16 units at bedtime >> inject this in the leg  Please work on your diet as we discussed.  Please return in 3 months with your sugar log.

## 2017-09-03 NOTE — Progress Notes (Signed)
Patient ID: Sara Lamb, female   DOB: April 02, 1951, 67 y.o.   MRN: [id number]   HPI: Sara Lamb is a 67 y.o.-year-old female, initially referred by her PCP, Vicie Mutters, PA,returning for f/u for DM2, dx in ~2010, insulin-dependent since 04/2016, uncontrolled, with complications (Atherosclerosis; reportedly cerebrovascular ds - TIAs). Last visit 3 mo ago.  Continues to have AP + Diarrhea  - see GI. On Neurontin, Flexeril.  She believes that Lantus irritates her stomach contributing to her symptoms.  Last hemoglobin A1c was: 06/04/2017: HbA1c calculated from the fructosamine is 7.6%, lower than the one measured. Lab Results  Component Value Date   HGBA1C 8.8 06/04/2017   HGBA1C 8.6 03/04/2017   HGBA1C 9.6 (H) 11/23/2016   Pt is on a regimen of: - Amaryl 4 mg 2x a day, before lunch (first meal of the day) and dinner - Welchol 1 capsule a day - Lantus 16 units at bedtime She was Insulin 70/30 10 units in am and 6 units in pm (started 04/2016) >> but sugars higher after this  - 300-400 >> stopped. She was on Metformin before >> "this gave me ministrokes" She was also on Tradjenta 5 mg daily before b'fast >> stopped as sugars were higher on this  Pt checks her sugars 2-3 a day - per log: - am: 111, 132-224, 251 >> 103,122-174, 188, 197 (cold med) >> 147-219, 293 - 2h after b'fast: n/c - before lunch: 110-179 >> 119-189, 208, 235 >> 107-189 >> 95-225, 300  - 2h after lunch: n/c - before dinner: 84, 103-288, 291 >> 89-189, 200 (when in pain) >> 96-208, 300 - 2h after dinner: n/c - bedtime: n/c - nighttime: n/c Lowest sugar was 89 >> 95. Highest sugar was 233 >> 300.  Glucometer: Prodigy  Pt's meals are: - Breakfast: skips - cannot eat before 12 pm Due to nausea - Lunch: salad - Dinner: chicken or seafood + veggies - Snacks: none No sodas.  - No CKD, last BUN/creatinine:  Lab Results  Component Value Date   BUN 10 08/16/2017   BUN 11 07/09/2017   CREATININE 0.77  08/16/2017   CREATININE 0.74 07/09/2017  On Lisinopril. - + HL;  last set of lipids: 08/16/2017: LDL 72 Lab Results  Component Value Date   CHOL 155 08/16/2017   HDL 50 (L) 08/16/2017   LDLCALC 163 (H) 11/23/2016   TRIG 249 (H) 08/16/2017   CHOLHDL 3.1 08/16/2017  On low dose Welchol. - last eye exam was in 2002 > No DR - No numbness and tingling in her feet.  She also HTN, anxiety/depression.   On Wellbutrin to help with quitting smoking. Not helping, though.  ROS: Constitutional: no weight gain/no weight loss, no fatigue, no subjective hyperthermia, no subjective hypothermia Eyes: no blurry vision, no xerophthalmia ENT: no sore throat, no nodules palpated in throat, no dysphagia, no odynophagia, no hoarseness Cardiovascular: no CP/no SOB/no palpitations/no leg swelling Respiratory: no cough/no SOB/no wheezing Gastrointestinal: no N/no V/+ D/+ C/no acid reflux Musculoskeletal: no muscle aches/+ joint aches Skin: no rashes, no hair loss Neurological: no tremors/no numbness/no tingling/no dizziness  I reviewed pt's medications, allergies, PMH, social hx, family hx, and changes were documented in the history of present illness. Otherwise, unchanged from my initial visit note.  Past Medical History:  Diagnosis Date  . Anxiety   . Anxiety disorder   . Cirrhosis ([place])   . COPD (chronic obstructive pulmonary disease) ([place])    followed by pcp--  last exacerbation 11/  2017  . Depression    Patient denies  . Gastritis   . History of rib fracture    2004  . 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 Hyperlipidemia   . Hypertension   . OA (osteoarthritis)    back, hips, knees, feet  . Osteoporosis   . Polyp of colon, hyperplastic   . Pulmonary nodules    per CT chest 08-28-2016  --  bilateral upper lobe nodules  . Stroke ([place])   . Tubular adenoma of colon   . Type 2 diabetes mellitus ([place])    followed by pcp--  last A1c 9.1 on 08-01-2016  . UTI (urinary tract infection)   . Wears dentures    Past Surgical History:  Procedure Laterality Date  . BILATERAL SALPINGOOPHORECTOMY  1997   via Laparoscopy  . CHOLECYSTECTOMY N/A 09/30/2016   Procedure: LAPAROSCOPIC CHOLECYSTECTOMY;  Surgeon: Mickeal Skinner, MD;  Location: [facility];  Service: General;  Laterality: N/A;  . KNEE ARTHROSCOPY Right 11/2014  . VAGINAL HYSTERECTOMY  age 67   Social History   Social History  . Marital status: Married    Spouse name: N/A  . Number of children: 1  . Years of education: N/A   Occupational History  . Retired   Social History Main Topics  . Smoking status: Current Every Day Smoker    Packs/day: 0.5    Types: Cigarettes  . Smokeless tobacco: Never Used       . Alcohol use No  . Drug use: No   Current Outpatient Medications on File Prior to Visit  Medication Sig Dispense Refill  . albuterol (PROVENTIL) (2.5 MG/3ML) 0.083% nebulizer solution Take 3 mLs (2.5 mg total) by nebulization every 6 (six) hours as needed for wheezing or shortness of breath. 75 mL 12  . ALPRAZolam (XANAX) 1 MG tablet Take 1 tablet (1 mg total) by mouth 3 (three) times daily as needed (TRY TO CUT BACK). 90 tablet 0  . aspirin-acetaminophen-caffeine (EXCEDRIN MIGRAINE) 250-250-65 MG tablet Take by mouth every 6 (six) hours as needed for headache.    . budesonide-formoterol (SYMBICORT) 160-4.5 MCG/ACT inhaler Inhale 2 puffs into the lungs 2 (two) times daily. 1 Inhaler 3  . Cholecalciferol (VITAMIN D3) 1000 units CAPS Take 1 capsule by mouth daily.    . colesevelam (WELCHOL) 625 MG tablet Take by mouth every morning.    . cyclobenzaprine (FLEXERIL) 10 MG tablet Take 1 tablet (10 mg total) by mouth at bedtime as needed for muscle spasms. 90 tablet 0  . DiphenhydrAMINE HCl, Sleep,  (SLEEP AID) 50 MG CAPS Take 1 capsule by mouth at bedtime.    . Diphenhydramine-APAP (PERCOGESIC) 12.5-325 MG TABS Take by mouth as needed.    . furosemide (LASIX) 40 MG tablet Take 0.5 tablets (20 mg total) by mouth daily. (Patient taking differently: Take 20 mg by mouth daily. ) 30 tablet 11  . glimepiride (AMARYL) 4 MG tablet Take 1 tablet (4 mg total) by mouth 2 (two) times daily before lunch and supper. 180 tablet 1  . Insulin Glargine (LANTUS SOLOSTAR) 100 UNIT/ML Solostar Pen Inject 14 Units into the skin at bedtime. 5 pen 11  . Insulin Pen Needle (CAREFINE PEN NEEDLES) 32G X 4  MM MISC Use 1x a day 100 each 3  . lisinopril (PRINIVIL,ZESTRIL) 40 MG tablet Take 1 tablet (40 mg total) by mouth daily. 90 tablet 3  . NEOMYCIN-POLYMYXIN-HYDROCORTISONE (CORTISPORIN) 1 % SOLN OTIC solution Place 3 drops into both ears 4 (four) times daily. 10 mL 0   No current facility-administered medications on file prior to visit.    Allergies  Allergen Reactions  . Metformin And Related     "metformin caused mini-strokes" and ELEVATED BS and also GLYBURIDE, ONGLYZA, ACTOS, INVOKANA caused elevated BS  . Insulins Rash  . Janumet [Sitagliptin-Metformin Hcl] Rash    Shakes/ rash  . Penicillins Rash   Family History  Problem Relation Age of Onset  . Cancer Father        PROSTATE  . Diabetes Father   . Hypertension Father   . Hyperlipidemia Sister   . Heart disease Brother   . Hyperlipidemia Brother   . Hypertension Brother   . Diabetes Brother   . Heart disease Sister   . Hyperlipidemia Sister   . Hypertension Sister   . Diabetes Mother   . Stroke Mother   . Stomach cancer Mother   . Colon cancer Neg Hx   . Esophageal cancer Neg Hx   . Rectal cancer Neg Hx     PE: BP 132/74   Pulse 94   Ht 4' 10"  (1.473 m)   Wt 141 lb (64 kg)   SpO2 99%   BMI 29.47 kg/m  Wt Readings from Last 3 Encounters:  09/03/17 141 lb (64 kg)  08/16/17 140 lb (63.5 kg)  07/26/17 140 lb (63.5 kg)    Constitutional: overweight, in NAD Eyes: PERRLA, EOMI, no exophthalmos ENT: moist mucous membranes, no thyromegaly, no cervical lymphadenopathy Cardiovascular: Tachycardia, RR, No MRG Respiratory: CTA B Gastrointestinal: abdomen soft, NT, ND, BS+ Musculoskeletal: no deformities, strength intact in all 4 Skin: moist, warm, no rashes Neurological: no tremor with outstretched hands, DTR normal in all 4  ASSESSMENT: 1. DM2, insulin-dependent, uncontrolled, with complications - atherosclerosis - reportedly TIAs >> no new events  2 HL  PLAN:  1. Patient with long-standing, uncontrolled, DM, with worse control at last visit >> we increased Lantus by 2 units. At that time, she was having AP + D >> investigated by GI. Still persistent sxs. A HbA1c was higher, 8.8%, but the HbA1c calculated from fructosamine was lower than measured, at 7.6%. - At this visit, she tells me that she believes that increasing the Lantus by 2 units at last visit caused her sugars to be more fluctuating, but mostly on the higher side.  She is also convinced that Lantus is irritating her stomach giving her GI symptoms.  We discussed about moving the injection site to the size, although I doubt that injecting in the stomach has any influence on her symptoms.  She is now willing to increase the dose further or add another diabetes medication.  In this case, we do not have other options then moving the Lantus to injecting in the thigh and continuing her current medications.  We again discussed about the importance of diet and I strongly advised her to try to eliminate fatty foods.  I gave her examples of healthier meals (from review of the chart, these foods were also reinforced by PCP) and I strongly advised her to try this for at least 3 weeks. - I advised her to:  Patient Instructions  Please continue: - Amaryl 4 mg 2x a day before meals -  Welchol 1 capsule a day - Lantus 16 units at bedtime >> inject this in the  leg  Please work on your diet as we discussed.  Please return in 3 months with your sugar log.    - today, HbA1c is 9.3% (higher) - continue checking sugars at different times of the day - check 1x a day, rotating checks - advised for yearly eye exams >> she is not UTD - she did not have one in years despite multiple promptings - Return to clinic in 3 mo with sugar log   2. HL - Reviewed latest lipid panel from 07/2017: LDL much improved, but triglycerides quite high - Discussed about improving diet - On Welchol low-dose - of note, she has cirrhosis - followed by Dr. Richardo Hanks, MD PhD [facility]

## 2017-09-29 ENCOUNTER — Other Ambulatory Visit: Payer: Self-pay | Admitting: Physician Assistant

## 2017-10-05 ENCOUNTER — Other Ambulatory Visit: Payer: Self-pay

## 2017-10-05 ENCOUNTER — Telehealth: Payer: Self-pay

## 2017-10-05 NOTE — Telephone Encounter (Signed)
Mailed patient a letter asking her to contact our office to schedule a follow up visit.

## 2017-10-05 NOTE — Telephone Encounter (Signed)
-----   Message from Yetta Flock, MD sent at 10/05/2017 11:59 AM EDT ----- Sara Lamb. Yes I think 3 months from now would be fine. I haven't seen her since September and given her cirrhosis, probably would be good to have a routine follow up scheduled. Thanks   ----- Message ----- From: Doristine Counter, RN Sent: 10/05/2017  11:06 AM To: Yetta Flock, MD  Patient is due for follow up BMET for elevated sodium level, looking at her follow up care at PCP they did repeat in January. Do you want to still repeat this in March? Thanks, [name]

## 2017-10-27 ENCOUNTER — Encounter: Payer: Self-pay | Admitting: Gastroenterology

## 2017-10-27 ENCOUNTER — Encounter (INDEPENDENT_AMBULATORY_CARE_PROVIDER_SITE_OTHER): Payer: Self-pay

## 2017-10-27 ENCOUNTER — Ambulatory Visit (INDEPENDENT_AMBULATORY_CARE_PROVIDER_SITE_OTHER): Payer: PPO | Admitting: Gastroenterology

## 2017-10-27 VITALS — BP 150/78 | HR 92 | Ht <= 58 in | Wt 141.2 lb

## 2017-10-27 DIAGNOSIS — R109 Unspecified abdominal pain: Secondary | ICD-10-CM | POA: Diagnosis not present

## 2017-10-27 DIAGNOSIS — R911 Solitary pulmonary nodule: Secondary | ICD-10-CM

## 2017-10-27 DIAGNOSIS — K7460 Unspecified cirrhosis of liver: Secondary | ICD-10-CM

## 2017-10-27 MED ORDER — GABAPENTIN 100 MG PO CAPS: 100.0000 mg | ORAL_CAPSULE | ORAL | 1 refills | Status: DC

## 2017-10-27 NOTE — Progress Notes (Signed)
HPI :  67 year old female here for reassessment of the following issues:  Cirrhosis - she had CT scan 01/07/2017 - suggestive of cirrhosis, prominent upper lymph nodes, pulmonary nodules. She had a cholecystectomy in March at which point in time Dr. Alvino Blood thought the liver was cirrhotic when viewed. Labs for chronic liver diseases were negative. She declined liver biopsy. EGD 02/05/17 - no varices, gastritis - negative for HP, repeat in 2-3 years. Vaccinated to hep A and B since last visit. She denies any jaundice, ascites, or lower extremity edema.  Thus far well compensated.  Ultrasound done in December which showed no evidence of HCC.  Abdominal pain -ongoing pain to the left upper quadrant and at times right upper quadrant.  It is tender to the touch, is worsened with coughing.  The present most of the time.  She has had an extensive evaluation for this including EGD, colonoscopy, CT scan which have not shown any cause.  I suspect she is having musculoskeletal/abdominal wall pain.  She was tried on capsaicin cream which did not help too much.  Think she is also tried lidocaine patches which have not helped.  She been placed on gabapentin 300 mg nightly which does help her pain, it does make her sedated however.  She has been using Flexeril as well for the pain which seems to help.  Prior workup: [place] 07/09/2017 - cirrhosis changes, no HCC, no other pathology Colonoscopy 01/11/17 - 5 small polyps removed, 3 adenomas, recall in 3 years EGD 02/05/17 - no varices, gastritis - negative for HP, repeat in 2-3 years.    Past Medical History:  Diagnosis Date  . Anxiety   . Anxiety disorder   . Cirrhosis ([place])   . COPD (chronic obstructive pulmonary disease) ([place])    followed by pcp--  last exacerbation 11/ 2017  . Depression    Patient denies  . Gastritis   . History of rib fracture    2004  . History of TIAs    x5-- last one 2017--  "gets real sharp stabbing pain over eye and speech screws  up"  pt states takes excederin migraine and lies down (pt stated was told if she "if come to ER again they would send her to psych ward")  . Hyperlipidemia   . Hypertension   . OA (osteoarthritis)    back, hips, knees, feet  . Osteoporosis   . Polyp of colon, hyperplastic   . Pulmonary nodules    per CT chest 08-28-2016  -- bilateral upper lobe nodules  . Stroke ([place])   . Tubular adenoma of colon   . Type 2 diabetes mellitus ([place])    followed by pcp--  last A1c 9.1 on 08-01-2016  . UTI (urinary tract infection)   . Wears dentures      Past Surgical History:  Procedure Laterality Date  . BILATERAL SALPINGOOPHORECTOMY  1997   via Laparoscopy  . CHOLECYSTECTOMY N/A 09/30/2016   Procedure: LAPAROSCOPIC CHOLECYSTECTOMY;  Surgeon: Mickeal Skinner, MD;  Location: [facility];  Service: General;  Laterality: N/A;  . KNEE ARTHROSCOPY Right 11/2014  . VAGINAL HYSTERECTOMY  age 15   Family History  Problem Relation Age of Onset  . Cancer Father        PROSTATE  . Diabetes Father   . Hypertension Father   . Hyperlipidemia Sister   . Heart disease Brother   . Hyperlipidemia Brother   . Hypertension Brother   . Diabetes Brother   .  Heart disease Sister   . Hyperlipidemia Sister   . Hypertension Sister   . Diabetes Mother   . Stroke Mother   . Stomach cancer Mother   . Colon cancer Neg Hx   . Esophageal cancer Neg Hx   . Rectal cancer Neg Hx    Social History   Tobacco Use  . Smoking status: Current Every Day Smoker    Packs/day: 0.50    Years: 35.00    Pack years: 17.50    Types: Cigarettes  . Smokeless tobacco: Never Used  . Tobacco comment: pt trying to quit -- on chantix -- pt down from 2ppd    Substance Use Topics  . Alcohol use: No    Alcohol/week: 0.0 oz  . Drug use: No   Current Outpatient Medications  Medication Sig Dispense Refill  . albuterol (PROVENTIL) (2.5 MG/3ML) 0.083% nebulizer solution Take 3 mLs (2.5 mg total) by nebulization  every 6 (six) hours as needed for wheezing or shortness of breath. 75 mL 12  . ALPRAZolam (XANAX) 1 MG tablet Take 1 tablet (1 mg total) by mouth 3 (three) times daily as needed (TRY TO CUT BACK). 90 tablet 0  . aspirin-acetaminophen-caffeine (EXCEDRIN MIGRAINE) 250-250-65 MG tablet Take by mouth every 6 (six) hours as needed for headache.    . budesonide-formoterol (SYMBICORT) 160-4.5 MCG/ACT inhaler Inhale 2 puffs into the lungs 2 (two) times daily. 1 Inhaler 3  . Cholecalciferol (VITAMIN D3) 1000 units CAPS Take 1 capsule by mouth daily.    . colesevelam (WELCHOL) 625 MG tablet Take by mouth every morning.    . cyclobenzaprine (FLEXERIL) 10 MG tablet Take 1 tablet (10 mg total) by mouth at bedtime as needed for muscle spasms. 90 tablet 0  . DiphenhydrAMINE HCl, Sleep, (SLEEP AID) 50 MG CAPS Take 1 capsule by mouth at bedtime.    . Diphenhydramine-APAP (PERCOGESIC) 12.5-325 MG TABS Take by mouth as needed.    . gabapentin (NEURONTIN) 300 MG capsule Take 300 mg by mouth daily.    Marland Kitchen glimepiride (AMARYL) 4 MG tablet Take 1 tablet (4 mg total) by mouth 2 (two) times daily before lunch and supper. 180 tablet 1  . Insulin Glargine (LANTUS SOLOSTAR) 100 UNIT/ML Solostar Pen Inject 16 Units into the skin at bedtime. 5 pen 11  . Insulin Pen Needle (CAREFINE PEN NEEDLES) 32G X 4 MM MISC Use 1x a day 100 each 3  . lisinopril (PRINIVIL,ZESTRIL) 40 MG tablet Take 1 tablet (40 mg total) by mouth daily. 90 tablet 3  . NEOMYCIN-POLYMYXIN-HYDROCORTISONE (CORTISPORIN) 1 % SOLN OTIC solution Place 3 drops into both ears 4 (four) times daily. 10 mL 0  . furosemide (LASIX) 40 MG tablet Take 0.5 tablets (20 mg total) by mouth daily. (Patient taking differently: Take 20 mg by mouth daily. ) 30 tablet 11   No current facility-administered medications for this visit.    Allergies  Allergen Reactions  . Metformin And Related     "metformin caused mini-strokes" and ELEVATED BS and also GLYBURIDE, ONGLYZA, ACTOS,  INVOKANA caused elevated BS  . Insulins Rash  . Janumet [Sitagliptin-Metformin Hcl] Rash    Shakes/ rash  . Penicillins Rash     Review of Systems: All systems reviewed and negative except where noted in HPI.   Lab Results  Component Value Date   WBC 10.3 08/16/2017   HGB 14.5 08/16/2017   HCT 42.4 08/16/2017   MCV 87.1 08/16/2017   PLT 292 08/16/2017    Lab Results  Component Value Date   CREATININE 0.77 08/16/2017   BUN 10 08/16/2017   NA 130 (L) 08/16/2017   K 4.9 08/16/2017   CL 93 (L) 08/16/2017   CO2 27 08/16/2017    Lab Results  Component Value Date   ALT 27 08/16/2017   AST 29 08/16/2017   ALKPHOS 113 07/09/2017   BILITOT 0.3 08/16/2017    Lab Results  Component Value Date   INR 1.0 07/09/2017   INR 1.0 11/23/2016     Physical Exam: BP (!) 150/78 (BP Location: Left Arm, Patient Position: Sitting, Cuff Size: Normal)   Pulse 92   Ht 4' 10"  (1.473 m)   Wt 141 lb 4 oz (64.1 kg)   BMI 29.52 kg/m  Constitutional: Pleasant,well-developed, female in no acute distress. HEENT: Normocephalic and atraumatic. Conjunctivae are normal. No scleral icterus. Neck supple.  Cardiovascular: Normal rate, regular rhythm.  Pulmonary/chest: Effort normal and breath sounds normal. No wheezing, rales or rhonchi. Abdominal: Soft, nondistended, sensitive and exquisite tenderness to palpation in LUQ and RUQ at costal margin. There are no masses palpable. No hepatomegaly. Extremities: no edema Lymphadenopathy: No cervical adenopathy noted. Neurological: Alert and oriented to person place and time. Skin: Skin is warm and dry. No rashes noted. Psychiatric: Normal mood and affect. Behavior is normal.   ASSESSMENT AND PLAN: 67 year old female here for reassessment of the following issues  Cirrhosis - incidentally noted during cholecystectomy and CT scan is consistent with this, although spleen is normal in size and platelets are normal, she is well compensated. Labs for  chronic liver disease are negative. Most recent LFTs normal. We have previously discussed role of liver biopsy to confirm whether or not she has cirrhosis, and clarify underlying etiology, she wishes to avoid this given her LFTs are normal and that biopsy may likely not change her management. She is due for recall ultrasound again in June with labs at that time. EGD shows no evidence of varices, repeat in 2 years or so. She agreed with the plan.  Abdominal wall pain - improved with gabapentin 300 mg nightly, this does cause her to feel sedated.  Will prescribe her 100 mg tablet to see if she can take this in the morning or during the day to provide additional pain management.  She should continue Flexeril as needed if this helps.  I reassured her prior workup has not shown other pathology.  Lung nodule - CT chest 1 year ago showed small bilateral nonspecific pulmonary nodules.  Given her tobacco history it was recommended that she have a follow-up CT chest in 1 year.  She is a few months overdue for that.  I offered to order it for her while she was here today.  She wished to proceed.  Hobart Cellar, MD [facility]

## 2017-10-27 NOTE — Patient Instructions (Addendum)
If you are age 67 or older, your body mass index should be between 23-30. Your Body mass index is 29.52 kg/m. If this is out of the aforementioned range listed, please consider follow up with your Primary Care Provider.  If you are age 52 or younger, your body mass index should be between 19-25. Your Body mass index is 29.52 kg/m. If this is out of the aformentioned range listed, please consider follow up with your Primary Care Provider.   You have been scheduled for a CT scan of the chest at [place] ([address]---this is in the same building as Press photographer).   You are scheduled on Wednesday, 11-03-17 at 2:30pm. You should arrive 15 minutes prior to your appointment time for registration.   If you have any questions regarding your exam or if you need to reschedule, you may call the CT department at [phone] between the hours of 8:00 am and 5:00 pm, Monday-Friday.  ________________________________________________________________________  Dennis Bast will be due for a recall Ultrasound and labs in June. We will send you a reminder in the mail when it gets closer to that time.    We have sent the following medications to your pharmacy for you to pick up at your convenience: Gabapentin 100 mg: Take every morning  Thank you for entrusting me with your care and for choosing [facility], Dr. Colfax Cellar

## 2017-11-02 ENCOUNTER — Ambulatory Visit (INDEPENDENT_AMBULATORY_CARE_PROVIDER_SITE_OTHER)
Admission: RE | Admit: 2017-11-02 | Discharge: 2017-11-02 | Disposition: A | Discharge status: Home / Self Care | Payer: PPO | Source: Ambulatory Visit | Attending: Gastroenterology | Admitting: Gastroenterology

## 2017-11-02 DIAGNOSIS — R109 Unspecified abdominal pain: Secondary | ICD-10-CM

## 2017-11-02 DIAGNOSIS — R918 Other nonspecific abnormal finding of lung field: Secondary | ICD-10-CM | POA: Diagnosis not present

## 2017-11-02 DIAGNOSIS — R911 Solitary pulmonary nodule: Secondary | ICD-10-CM | POA: Diagnosis not present

## 2017-11-02 DIAGNOSIS — K7460 Unspecified cirrhosis of liver: Secondary | ICD-10-CM

## 2017-11-03 ENCOUNTER — Inpatient Hospital Stay: Admission: RE | Admit: 2017-11-03 | Payer: PPO | Source: Ambulatory Visit

## 2017-11-17 ENCOUNTER — Other Ambulatory Visit: Payer: Self-pay | Admitting: Physician Assistant

## 2017-12-01 ENCOUNTER — Other Ambulatory Visit: Payer: Self-pay | Admitting: Physician Assistant

## 2017-12-01 DIAGNOSIS — E1151 Type 2 diabetes mellitus with diabetic peripheral angiopathy without gangrene: Secondary | ICD-10-CM

## 2017-12-01 DIAGNOSIS — I700 Atherosclerosis of aorta: Principal | ICD-10-CM

## 2017-12-02 ENCOUNTER — Ambulatory Visit: Payer: PPO | Admitting: Internal Medicine

## 2017-12-28 ENCOUNTER — Telehealth: Payer: Self-pay

## 2017-12-28 DIAGNOSIS — K7460 Unspecified cirrhosis of liver: Secondary | ICD-10-CM

## 2017-12-28 NOTE — Telephone Encounter (Signed)
-----   Message from Roetta Sessions, Day sent at 10/27/2017  9:52 AM EDT ----- Regarding: recall [place] and labs due Recall labs (CBC, CMET, INR, AFP) and RUQ U/S for "[place] screening" due in June.

## 2017-12-28 NOTE — Telephone Encounter (Signed)
Called and spoke to pt. She is due for labs and U/S RUQ. Prefers not to come too early in the am   Pt scheduled for Friday, 6-7 at 11:30am at [facility]; to arrive at 11:15am. NPO after midnight  Called and relayed info to pt.  She wrote it all down and expressed understanding. Will go to lab before or after

## 2017-12-31 ENCOUNTER — Ambulatory Visit (HOSPITAL_COMMUNITY)
Admission: RE | Admit: 2017-12-31 | Discharge: 2017-12-31 | Disposition: A | Discharge status: Home / Self Care | Payer: PPO | Source: Ambulatory Visit | Attending: Gastroenterology | Admitting: Gastroenterology

## 2017-12-31 ENCOUNTER — Other Ambulatory Visit (INDEPENDENT_AMBULATORY_CARE_PROVIDER_SITE_OTHER): Payer: PPO

## 2017-12-31 DIAGNOSIS — K7460 Unspecified cirrhosis of liver: Secondary | ICD-10-CM | POA: Diagnosis not present

## 2017-12-31 DIAGNOSIS — Z9049 Acquired absence of other specified parts of digestive tract: Secondary | ICD-10-CM | POA: Diagnosis not present

## 2017-12-31 LAB — COMPREHENSIVE METABOLIC PANEL
ALT: 26 U/L (ref 0–35)
AST: 28 U/L (ref 0–37)
Albumin: 4 g/dL (ref 3.5–5.2)
Alkaline Phosphatase: 125 U/L — ABNORMAL HIGH (ref 39–117)
BUN: 10 mg/dL (ref 6–23)
CHLORIDE: 94 meq/L — AB (ref 96–112)
CO2: 26 meq/L (ref 19–32)
CREATININE: 0.75 mg/dL (ref 0.40–1.20)
Calcium: 9.6 mg/dL (ref 8.4–10.5)
GFR: 81.99 mL/min (ref >60.00)
Glucose, Bld: 171 mg/dL — ABNORMAL HIGH (ref 70–99)
POTASSIUM: 4.5 meq/L (ref 3.5–5.1)
Sodium: 130 mEq/L — ABNORMAL LOW (ref 135–145)
Total Bilirubin: 0.4 mg/dL (ref 0.2–1.2)
Total Protein: 7.4 g/dL (ref 6.0–8.3)

## 2017-12-31 LAB — CBC WITH DIFFERENTIAL/PLATELET
BASOS PCT: 1.1 % (ref 0.0–3.0)
Basophils Absolute: 0.1 10*3/uL (ref 0.0–0.1)
EOS ABS: 0.4 10*3/uL (ref 0.0–0.7)
EOS PCT: 4.3 % (ref 0.0–5.0)
HEMATOCRIT: 41.4 % (ref 36.0–46.0)
HEMOGLOBIN: 14.5 g/dL (ref 12.0–15.0)
Lymphocytes Relative: 25.7 % (ref 12.0–46.0)
Lymphs Abs: 2.3 10*3/uL (ref 0.7–4.0)
MCHC: 35 g/dL (ref 30.0–36.0)
MCV: 86.1 fl (ref 78.0–100.0)
MONO ABS: 0.6 10*3/uL (ref 0.1–1.0)
Monocytes Relative: 6.5 % (ref 3.0–12.0)
NEUTROS ABS: 5.6 10*3/uL (ref 1.4–7.7)
Neutrophils Relative %: 62.4 % (ref 43.0–77.0)
PLATELETS: 266 10*3/uL (ref 150.0–400.0)
RBC: 4.81 Mil/uL (ref 3.87–5.11)
RDW: 12.6 % (ref 11.5–15.5)
WBC: 9 10*3/uL (ref 4.0–10.5)

## 2017-12-31 LAB — PROTIME-INR
INR: 0.9 ratio (ref 0.8–1.0)
PROTHROMBIN TIME: 11.1 s (ref 9.6–13.1)

## 2018-01-03 ENCOUNTER — Telehealth: Payer: Self-pay | Admitting: *Deleted

## 2018-01-03 LAB — AFP TUMOR MARKER: AFP-Tumor Marker: 7.8 ng/mL — ABNORMAL HIGH

## 2018-01-03 NOTE — Telephone Encounter (Signed)
error 

## 2018-01-04 ENCOUNTER — Other Ambulatory Visit: Payer: Self-pay | Admitting: Gastroenterology

## 2018-01-04 ENCOUNTER — Other Ambulatory Visit: Payer: Self-pay | Admitting: Physician Assistant

## 2018-02-08 IMAGING — CR DG CHEST 2V
2 series · 2 of 2 positions shown · non-contrast
Comparison: 04/07/2005

CLINICAL DATA: COPD, cough, bilateral chest tenderness

EXAM:
CHEST  2 VIEW

[chest pa]
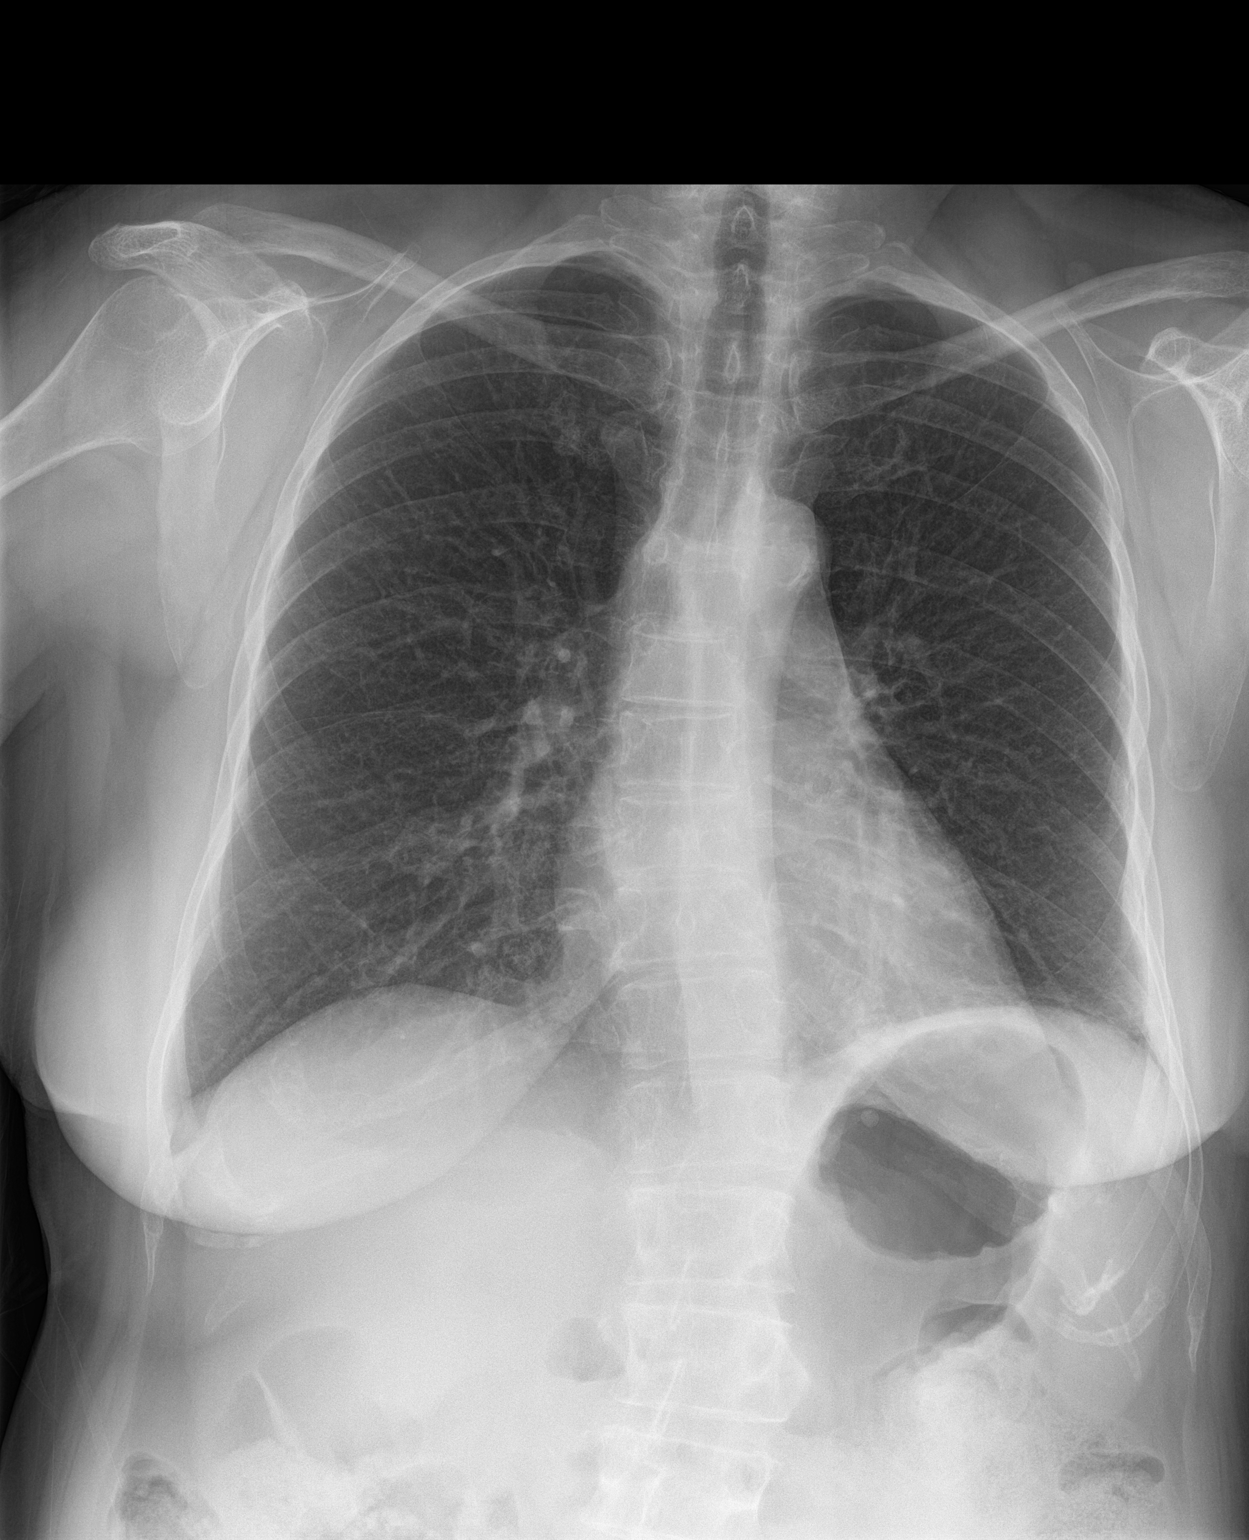

[chest lat]
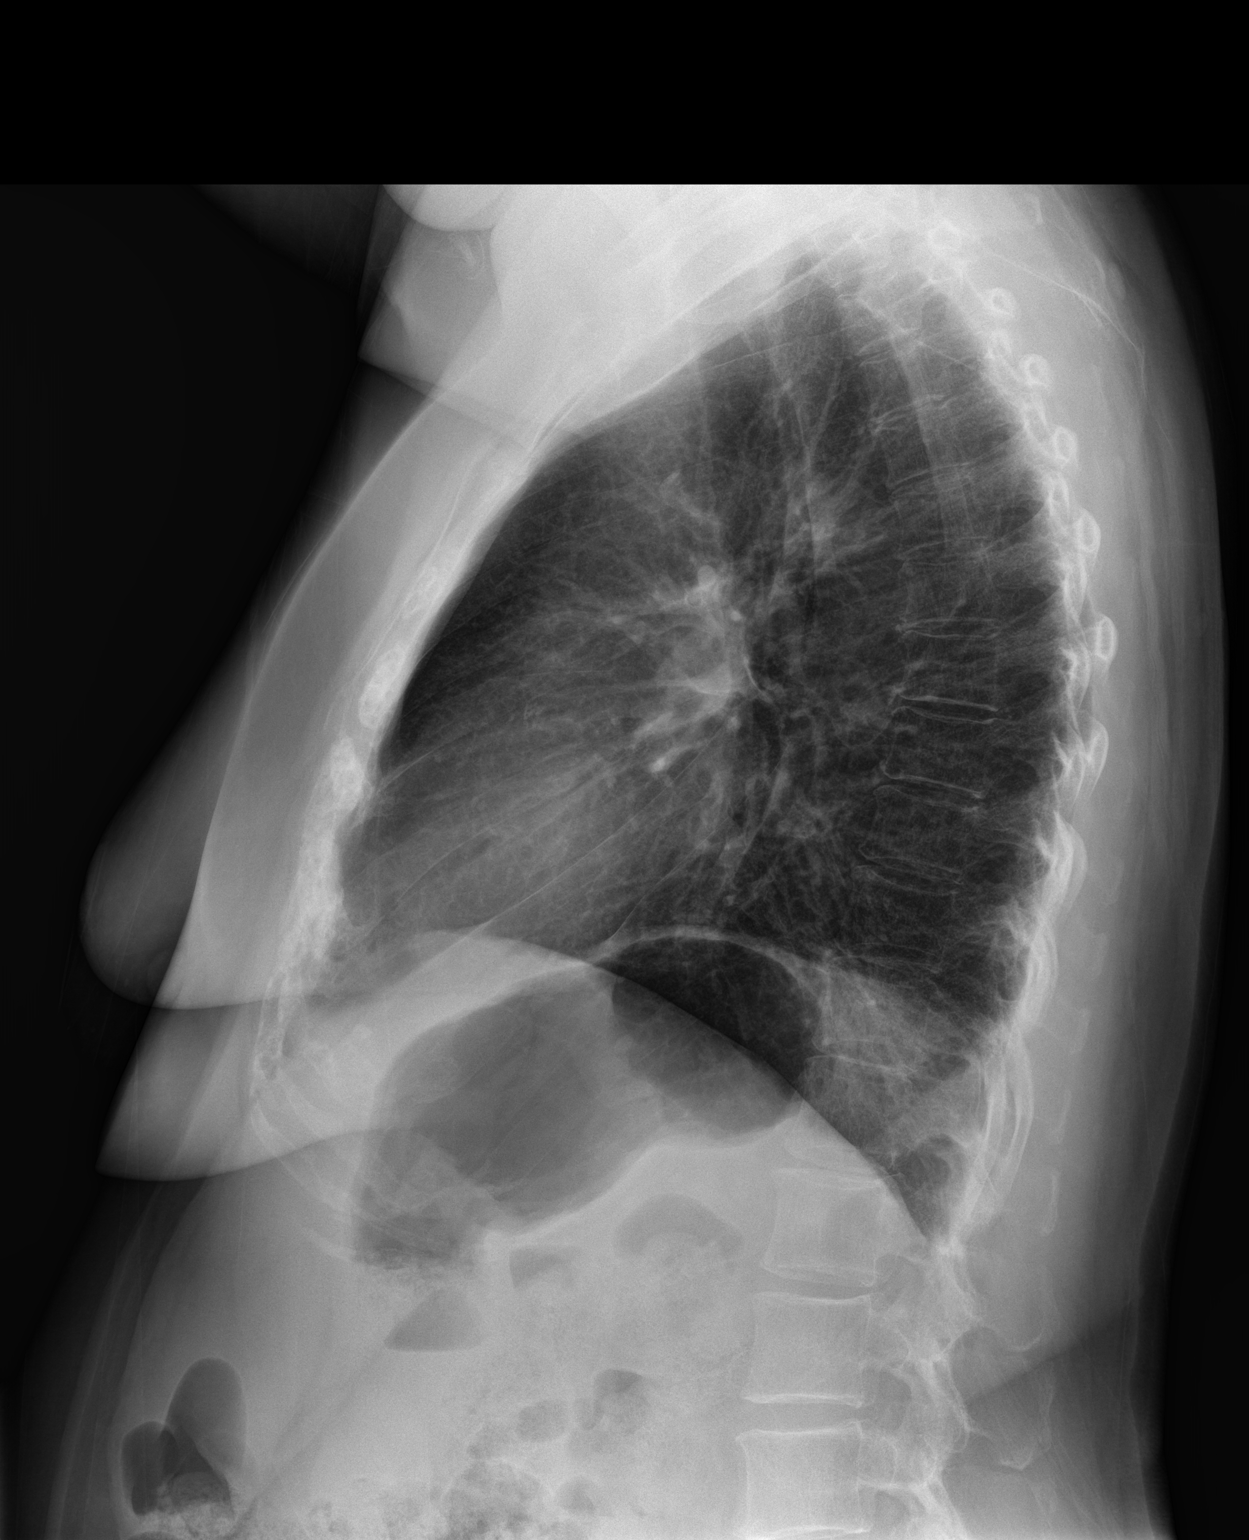

[2 of 2 positions shown; findings below may reference images not displayed]

FINDINGS: Normal mediastinum and cardiac silhouette. No effusion, infiltrate
or pneumothorax. Mild bronchitic change.
IMPRESSION: Bronchitic change without acute cardiopulmonary findings.

## 2018-02-11 NOTE — Progress Notes (Signed)
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  Perform daily foot/skin check, notify office of any concerning changes.  Check A1C  Essential hypertension Well controlled with current medications  Monitor blood pressure at home; patient to call if consistently greater than 130/80 Continue DASH diet.   Reminder to go to the ER if any CP, SOB, nausea, dizziness, severe HA, changes vision/speech, left arm numbness and tingling and jaw pain.  Chronic obstructive pulmonary disease, unspecified COPD type ([place]) Advised to stop smoking, get CXR at CPE, continue inhalers  Liver cirrhosis secondary to NASH ([place]) Weight loss advised, avoid alcohol/tylenol, will monitor LFTs  Vitamin D deficiency Recommended supplementation for goal of 70-100 Check vitamin D level  Tobacco use disorder Discussed risks associated with tobacco use and advised to reduce or quit Patient is not ready to do so, but advised to consider strongly Will follow up at the next visit  Medication management CBC, CMP/GFR, mag  Hyperlipidemia, mixed Continue medications Continue low cholesterol diet and exercise.  Check lipid panel.   Anxiety Continue medications Stress management techniques discussed, increase water, good sleep hygiene discussed, increase exercise, and increase veggies.   BMI 29.0-29.9,adult Long discussion about weight loss, diet, and exercise Recommended diet heavy in fruits and veggies and low in animal meats, cheeses, and dairy products, appropriate calorie intake Discussed appropriate weight for height  Follow up at next visit  Chest pain EKG - suggests mild ST elevation but appears NSCPT compared to last year and from 2009, verified with Dr. Melford Aase and in agreement Will get STAT  troponin  Has never had stress test, 30+ year smoking history, reports strong family history of cardiac disease, ? Very mild 2/6 systolic murmur that is new heard today, discussed with patient and will plan to refer to cardiology for stress test and ECHO if recommended  Continue diet and meds as discussed. Further disposition pending results of labs. Discussed med's effects and SE's.   Over 30 minutes of exam, counseling, chart review, and critical decision making was performed.   Future Appointments  Date Time Provider [place]  05/18/2018  4:15 PM Unk Pinto, MD [facility] None  08/16/2018  2:00 PM Vicie Mutters, PA-C [facility] None    ----------------------------------------------------------------------------------------------------------------------  HPI 67 y.o. female  presents for 3 month follow up on hypertension, cholesterol, diabetes, weight and vitamin D deficiency. She has liver cirrhosis secondary to NASH.   The patient reports intermittent dull pain in her left chest and occasionally under axilla over the last 2 weeks, lasts a few minutes, having multiple episodes every afternoon, gets a bit short of breath with this as well. She is a smoker with a strong family history. She has never had full cardiac workup. She also reports tenderness over this area. She has been feeling more anxious. Denies nausea/fatigue/arm/neck pain.   she currently continues to smoke 0.5 pack a day and has COPD treated by symbicort; discussed risks associated with smoking, patient is not ready to quit.   she has a diagnosis of anxiety and is currently prescribed xanax 0.5-1 mg TID PRN, she reports she is currently taking 1 mg at night for sleep, and takes a 1/2 tab in the afternoons if needed. She reports she has been stressed out recently related to her husband.   BMI is  Body mass index is 28.55 kg/m., she has not been working on diet and exercise. Wt Readings from Last 3 Encounters:   02/14/18 136 lb 9.6 oz (62 kg)  10/27/17 141 lb 4 oz (64.1 kg)  09/03/17 141 lb (64 kg)   Her blood pressure has been controlled at home (presents with log showing 90-102/60s, does have some hypotensive values 86/60, 102/50, reports has been having dizziness with low values), no values over 130/80, today their BP is BP: (!) 170/72 But 148/72 on recheck. She is very anxious today about intermittent chest pains.   She does not workout.    She is on cholesterol medication (stopped welchol per instructions from Dr. Havery Moros per patient) and denies myalgias. Her cholesterol is at goal. The cholesterol last visit was:   Lab Results  Component Value Date   CHOL 155 08/16/2017   HDL 50 (L) 08/16/2017   LDLCALC 72 08/16/2017   TRIG 249 (H) 08/16/2017   CHOLHDL 3.1 08/16/2017    She has not been working on diet and exercise for T2DM managed by Dr. Cruzita Lederer (on lantus 16 units - admits she has not been taking, and amaryl 4 mg BID), and denies foot ulcerations, increased appetite, nausea, polydipsia, polyuria, visual disturbances, vomiting and weight loss. She does check fasting sugars, runs 160-200. Last A1C in the office was:  Lab Results  Component Value Date   HGBA1C 9.3 09/03/2017   Patient is on Vitamin D supplement.   Lab Results  Component Value Date   VD25OH 14 (L) 03/23/2017        Current Medications:  Current Outpatient Medications on File Prior to Visit  Medication Sig  . albuterol (PROVENTIL) (2.5 MG/3ML) 0.083% nebulizer solution Take 3 mLs (2.5 mg total) by nebulization every 6 (six) hours as needed for wheezing or shortness of breath.  Marland Kitchen aspirin-acetaminophen-caffeine (EXCEDRIN MIGRAINE) 250-250-65 MG tablet Take by mouth every 6 (six) hours as needed for headache.  . budesonide-formoterol (SYMBICORT) 160-4.5 MCG/ACT inhaler Inhale 2 puffs into the lungs 2 (two) times daily.  . Cholecalciferol (VITAMIN D3) 1000 units CAPS Take 1 capsule by mouth daily.  . cyclobenzaprine  (FLEXERIL) 10 MG tablet Take 1 tablet (10 mg total) by mouth at bedtime as needed for muscle spasms.  . DiphenhydrAMINE HCl, Sleep, (SLEEP AID) 50 MG CAPS Take 1 capsule by mouth at bedtime.  . Diphenhydramine-APAP (PERCOGESIC) 12.5-325 MG TABS Take by mouth as needed.  . furosemide (LASIX) 40 MG tablet Take 0.5 tablets (20 mg total) by mouth daily. (Patient taking differently: Take 20 mg by mouth daily. )  . gabapentin (NEURONTIN) 100 MG capsule Take 1 capsule (100 mg total) by mouth every morning.  . gabapentin (NEURONTIN) 300 MG capsule Take 300 mg by mouth daily.  Marland Kitchen glimepiride (AMARYL) 4 MG tablet TAKE ONE TABLET BY MOUTH TWICE DAILY BEFORE LUNCH AND SUPPER  . Insulin Pen Needle (CAREFINE PEN NEEDLES) 32G X 4 MM MISC Use 1x a day  . lisinopril (PRINIVIL,ZESTRIL) 40 MG tablet Take 1 tablet (40 mg total) by mouth daily.  . colesevelam (WELCHOL) 625 MG tablet Take by mouth every morning.  . Insulin Glargine (LANTUS SOLOSTAR) 100 UNIT/ML Solostar Pen Inject 16 Units into the skin at bedtime. (Patient not taking: Reported on 02/14/2018)  . NEOMYCIN-POLYMYXIN-HYDROCORTISONE (CORTISPORIN) 1 % SOLN OTIC solution Place 3 drops into both ears 4 (four) times daily. (Patient not taking: Reported on 02/14/2018)   No current facility-administered medications on file prior to visit.  Allergies:  Allergies  Allergen Reactions  . Metformin And Related     "metformin caused mini-strokes" and ELEVATED BS and also GLYBURIDE, ONGLYZA, ACTOS, INVOKANA caused elevated BS  . Insulins Rash  . Janumet [Sitagliptin-Metformin Hcl] Rash    Shakes/ rash  . Penicillins Rash     Medical History:  Past Medical History:  Diagnosis Date  . Anxiety   . Anxiety disorder   . Cirrhosis ([place])   . COPD (chronic obstructive pulmonary disease) ([place])    followed by pcp--  last exacerbation 11/ 2017  . Depression    Patient denies  . Gastritis   . History of rib fracture    2004  . History of TIAs    x5-- last  one 2017--  "gets real sharp stabbing pain over eye and speech screws up"  pt states takes excederin migraine and lies down (pt stated was told if she "if come to ER again they would send her to psych ward")  . Hyperlipidemia   . Hypertension   . OA (osteoarthritis)    back, hips, knees, feet  . Osteoporosis   . Polyp of colon, hyperplastic   . Pulmonary nodules    per CT chest 08-28-2016  -- bilateral upper lobe nodules  . Stroke ([place])   . Tubular adenoma of colon   . Type 2 diabetes mellitus ([place])    followed by pcp--  last A1c 9.1 on 08-01-2016  . UTI (urinary tract infection)   . Wears dentures    Family history- Reviewed and unchanged Social history- Reviewed and unchanged   Review of Systems:  Review of Systems  Constitutional: Negative for malaise/fatigue and weight loss.  HENT: Negative for hearing loss and tinnitus.   Eyes: Negative for blurred vision and double vision.  Respiratory: Positive for shortness of breath. Negative for cough and wheezing.   Cardiovascular: Positive for chest pain. Negative for palpitations, orthopnea, claudication and leg swelling.  Gastrointestinal: Negative for abdominal pain, blood in stool, constipation, diarrhea, heartburn, melena, nausea and vomiting.  Genitourinary: Negative.   Musculoskeletal: Negative for joint pain and myalgias.  Skin: Negative for rash.  Neurological: Negative for dizziness, tingling, sensory change, weakness and headaches.  Endo/Heme/Allergies: Negative for polydipsia.  Psychiatric/Behavioral: Negative for depression, substance abuse and suicidal ideas. The patient is nervous/anxious and has insomnia.   All other systems reviewed and are negative.   Physical Exam: BP (!) 170/72   Pulse 92   Temp (!) 97.4 F (36.3 C)   Ht 4' 10"  (1.473 m)   Wt 136 lb 9.6 oz (62 kg)   SpO2 99%   BMI 28.55 kg/m  Wt Readings from Last 3 Encounters:  02/14/18 136 lb 9.6 oz (62 kg)  10/27/17 141 lb 4 oz (64.1 kg)  09/03/17  141 lb (64 kg)   General Appearance: Well nourished, in no apparent distress. Eyes: PERRLA, EOMs, conjunctiva no swelling or erythema Sinuses: No Frontal/maxillary tenderness ENT/Mouth: Ext aud canals clear, TMs without erythema, bulging. No erythema, swelling, or exudate on post pharynx.  Tonsils not swollen or erythematous. Hearing normal.  Neck: Supple, thyroid normal.  Respiratory: Respiratory effort normal, BS equal bilaterally without rales, rhonchi, wheezing or stridor.  Cardio: RRR with ? 1/6 early systolic murmur over R 2nd ICS. Brisk peripheral pulses without edema.  Abdomen: Soft, + BS.  Non tender, no guarding, rebound, hernias, masses. Lymphatics: Non tender without lymphadenopathy.  Musculoskeletal: Full ROM, 5/5 strength, Normal gait. She has localized tenderness over left 4th-5th rib  mid-clavicular area, some chest wall tenderness mid-axillary as well.  Skin: Warm, dry without rashes, lesions, ecchymosis.  Neuro: Cranial nerves intact. No cerebellar symptoms.  Psych: Awake and oriented X 3, normal affect, Insight and Judgment fair.    Izora Ribas, NP 12:07 PM [facility]

## 2018-02-14 ENCOUNTER — Ambulatory Visit (INDEPENDENT_AMBULATORY_CARE_PROVIDER_SITE_OTHER): Payer: PPO | Admitting: Adult Health

## 2018-02-14 ENCOUNTER — Encounter: Payer: Self-pay | Admitting: Adult Health

## 2018-02-14 VITALS — BP 148/72 | HR 92 | Temp 97.4°F | Ht <= 58 in | Wt 136.6 lb

## 2018-02-14 DIAGNOSIS — E559 Vitamin D deficiency, unspecified: Secondary | ICD-10-CM | POA: Diagnosis not present

## 2018-02-14 DIAGNOSIS — I700 Atherosclerosis of aorta: Secondary | ICD-10-CM | POA: Diagnosis not present

## 2018-02-14 DIAGNOSIS — I10 Essential (primary) hypertension: Secondary | ICD-10-CM

## 2018-02-14 DIAGNOSIS — K7460 Unspecified cirrhosis of liver: Secondary | ICD-10-CM

## 2018-02-14 DIAGNOSIS — E782 Mixed hyperlipidemia: Secondary | ICD-10-CM

## 2018-02-14 DIAGNOSIS — Z8249 Family history of ischemic heart disease and other diseases of the circulatory system: Secondary | ICD-10-CM | POA: Diagnosis not present

## 2018-02-14 DIAGNOSIS — R079 Chest pain, unspecified: Secondary | ICD-10-CM | POA: Diagnosis not present

## 2018-02-14 DIAGNOSIS — F419 Anxiety disorder, unspecified: Secondary | ICD-10-CM

## 2018-02-14 DIAGNOSIS — J449 Chronic obstructive pulmonary disease, unspecified: Secondary | ICD-10-CM | POA: Diagnosis not present

## 2018-02-14 DIAGNOSIS — Z6829 Body mass index (BMI) 29.0-29.9, adult: Secondary | ICD-10-CM

## 2018-02-14 DIAGNOSIS — F17200 Nicotine dependence, unspecified, uncomplicated: Secondary | ICD-10-CM | POA: Diagnosis not present

## 2018-02-14 DIAGNOSIS — K7581 Nonalcoholic steatohepatitis (NASH): Secondary | ICD-10-CM

## 2018-02-14 DIAGNOSIS — E1151 Type 2 diabetes mellitus with diabetic peripheral angiopathy without gangrene: Secondary | ICD-10-CM

## 2018-02-14 DIAGNOSIS — Z79899 Other long term (current) drug therapy: Secondary | ICD-10-CM

## 2018-02-14 LAB — TROPONIN I: Troponin I: 0.01 ng/mL (ref <=0.0)

## 2018-02-14 MED ORDER — ALPRAZOLAM 1 MG PO TABS: ORAL_TABLET | ORAL | 0 refills | Status: DC

## 2018-02-14 NOTE — Patient Instructions (Addendum)
Please get back on insulin - 16 units daily, restart welchol, call to let us know what the extra cholesterol medication you are taking twice a week is   Aim for 7+ servings of fruits and vegetables daily  80+ fluid ounces of water or unsweet tea for healthy kidneys  Limit alcohol, avoid tobacco/smoking  Limit animal fats in diet for cholesterol and heart health - choose grass fed whenever available  Aim for low stress - take time to unwind and care for your mental health  Aim for 150 min of moderate intensity exercise weekly for heart health, and weights twice weekly for bone health  Aim for 7-9 hours of sleep daily    Smoking Tobacco Information Smoking tobacco will very likely harm your health. Tobacco contains a poisonous (toxic), colorless chemical called nicotine. Nicotine affects the brain and makes tobacco addictive. This change in your brain can make it hard to stop smoking. Tobacco also has other toxic chemicals that can hurt your body and raise your risk of many cancers. How can smoking tobacco affect me? Smoking tobacco can increase your chances of having serious health conditions, such as:  Cancer. Smoking is most commonly associated with lung cancer, but can lead to cancer in other parts of the body.  Chronic obstructive pulmonary disease (COPD). This is a long-term lung condition that makes it hard to breathe. It also gets worse over time.  High blood pressure (hypertension), heart disease, stroke, or heart attack.  Lung infections, such as pneumonia.  Cataracts. This is when the lenses in the eyes become clouded.  Digestive problems. This may include peptic ulcers, heartburn, and gastroesophageal reflux disease (GERD).  Oral health problems, such as gum disease and tooth loss.  Loss of taste and smell.  Smoking can affect your appearance by causing:  Wrinkles.  Yellow or stained teeth, fingers, and fingernails.  Smoking tobacco can also affect your social  life.  Many workplaces, [employer], hotels, and public places are tobacco-free. This means that you may experience challenges in finding places to smoke when away from home.  The cost of a smoking habit can be expensive. Expenses for someone who smokes come in two ways: ? You spend money on a regular basis to buy tobacco. ? Your health care costs in the long-term are higher if you smoke.  Tobacco smoke can also affect the health of those around you. Children of smokers have greater chances of: ? Sudden infant death syndrome (SIDS). ? Ear infections. ? Lung infections.  What lifestyle changes can be made?  Do not start smoking. Quit if you already do.  To quit smoking: ? Make a plan to quit smoking and commit yourself to it. Look for programs to help you and ask your health care provider for recommendations and ideas. ? Talk with your health care provider about using nicotine replacement medicines to help you quit. Medicine replacement medicines include gum, lozenges, patches, sprays, or pills. ? Do not replace cigarette smoking with electronic cigarettes, which are commonly called e-cigarettes. The safety of e-cigarettes is not known, and some may contain harmful chemicals. ? Avoid places, people, or situations that tempt you to smoke. ? If you try to quit but return to smoking, don't give up hope. It is very common for people to try a number of times before they fully succeed. When you feel ready again, give it another try.  Quitting smoking might affect the way you eat as well as your weight. Be prepared to monitor  your eating habits. Get support in planning and following a healthy diet.  Ask your health care provider about having regular tests (screenings) to check for cancer. This may include blood tests, imaging tests, and other tests.  Exercise regularly. Consider taking walks, joining a gym, or doing yoga or exercise classes.  Develop skills to manage your stress. These skills  include meditation. What are the benefits of quitting smoking? By quitting smoking, you may:  Lower your risk of getting cancer and other diseases caused by smoking.  Live longer.  Breathe better.  Lower your blood pressure and heart rate.  Stop your addiction to tobacco.  Stop creating secondhand smoke that hurts other people.  Improve your sense of taste and smell.  Look better over time, due to having fewer wrinkles and less staining.  What can happen if changes are not made? If you do not stop smoking, you may:  Get cancer and other diseases.  Develop COPD or other long-term (chronic) lung conditions.  Develop serious problems with your heart and blood vessels (cardiovascular system).  Need more tests to screen for problems caused by smoking.  Have higher, long-term healthcare costs from medicines or treatments related to smoking.  Continue to have worsening changes in your lungs, mouth, and nose.  Where to find support: To get support to quit smoking, consider:  Asking your health care provider for more information and resources.  Taking classes to learn more about quitting smoking.  Looking for local organizations that offer resources about quitting smoking.  Joining a support group for people who want to quit smoking in your local community.  Where to find more information: You may find more information about quitting smoking from:  HelpGuide.org: www.helpguide.org/articles/addictions/how-to-quit-smoking.htm  https://hall.com/: smokefree.gov  American Lung Association: www.lung.org  Contact a health care provider if:  You have problems breathing.  Your lips, nose, or fingers turn blue.  You have chest pain.  You are coughing up blood.  You feel faint or you pass out.  You have other noticeable changes that cause you to worry. Summary  Smoking tobacco can negatively affect your health, the health of those around you, your finances, and your  social life.  Do not start smoking. Quit if you already do. If you need help quitting, ask your health care provider.  Think about joining a support group for people who want to quit smoking in your local community. There are many effective programs that will help you to quit this behavior. This information is not intended to replace advice given to you by your health care provider. Make sure you discuss any questions you have with your health care provider. Document Released: 07/28/2016 Document Revised: 07/28/2016 Document Reviewed: 07/28/2016 Elsevier Interactive Patient Education  2018 [employer].     Steps to Quit Smoking Smoking tobacco can be harmful to your health and can affect almost every organ in your body. Smoking puts you, and those around you, at risk for developing many serious chronic diseases. Quitting smoking is difficult, but it is one of the best things that you can do for your health. It is never too late to quit. What are the benefits of quitting smoking? When you quit smoking, you lower your risk of developing serious diseases and conditions, such as:  Lung cancer or lung disease, such as COPD.  Heart disease.  Stroke.  Heart attack.  Infertility.  Osteoporosis and bone fractures.  Additionally, symptoms such as coughing, wheezing, and shortness of breath may get better  when you quit. You may also find that you get sick less often because your body is stronger at fighting off colds and infections. If you are pregnant, quitting smoking can help to reduce your chances of having a baby of low birth weight. How do I get ready to quit? When you decide to quit smoking, create a plan to make sure that you are successful. Before you quit:  Pick a date to quit. Set a date within the next two weeks to give you time to prepare.  Write down the reasons why you are quitting. Keep this list in places where you will see it often, such as on your bathroom mirror or in  your car or wallet.  Identify the people, places, things, and activities that make you want to smoke (triggers) and avoid them. Make sure to take these actions: ? Throw away all cigarettes at home, at work, and in your car. ? Throw away smoking accessories, such as Scientist, research (medical). ? Clean your car and make sure to empty the ashtray. ? Clean your home, including curtains and carpets.  Tell your family, friends, and coworkers that you are quitting. Support from your loved ones can make quitting easier.  Talk with your health care provider about your options for quitting smoking.  Find out what treatment options are covered by your health insurance.  What strategies can I use to quit smoking? Talk with your healthcare provider about different strategies to quit smoking. Some strategies include:  Quitting smoking altogether instead of gradually lessening how much you smoke over a period of time. Research shows that quitting "cold [place]" is more successful than gradually quitting.  Attending in-person counseling to help you build problem-solving skills. You are more likely to have success in quitting if you attend several counseling sessions. Even short sessions of 10 minutes can be effective.  Finding resources and support systems that can help you to quit smoking and remain smoke-free after you quit. These resources are most helpful when you use them often. They can include: ? Online chats with a Social worker. ? Telephone quitlines. ? Careers information officer. ? Support groups or group counseling. ? Text messaging programs. ? Mobile phone applications.  Taking medicines to help you quit smoking. (If you are pregnant or breastfeeding, talk with your health care provider first.) Some medicines contain nicotine and some do not. Both types of medicines help with cravings, but the medicines that include nicotine help to relieve withdrawal symptoms. Your health care provider may  recommend: ? Nicotine patches, gum, or lozenges. ? Nicotine inhalers or sprays. ? Non-nicotine medicine that is taken by mouth.  Talk with your health care provider about combining strategies, such as taking medicines while you are also receiving in-person counseling. Using these two strategies together makes you more likely to succeed in quitting than if you used either strategy on its own. If you are pregnant or breastfeeding, talk with your health care provider about finding counseling or other support strategies to quit smoking. Do not take medicine to help you quit smoking unless told to do so by your health care provider. What things can I do to make it easier to quit? Quitting smoking might feel overwhelming at first, but there is a lot that you can do to make it easier. Take these important actions:  Reach out to your family and friends and ask that they support and encourage you during this time. Call telephone quitlines, reach out to support groups, or work  with a counselor for support.  Ask people who smoke to avoid smoking around you.  Avoid places that trigger you to smoke, such as bars, parties, or smoke-break areas at work.  Spend time around people who do not smoke.  Lessen stress in your life, because stress can be a smoking trigger for some people. To lessen stress, try: ? Exercising regularly. ? Deep-breathing exercises. ? Yoga. ? Meditating. ? Performing a body scan. This involves closing your eyes, scanning your body from head to toe, and noticing which parts of your body are particularly tense. Purposefully relax the muscles in those areas.  Download or purchase mobile phone or tablet apps (applications) that can help you stick to your quit plan by providing reminders, tips, and encouragement. There are many free apps, such as QuitGuide from the [employer] Office manager for Disease Control and Prevention). You can find other support for quitting smoking (smoking cessation) through  smokefree.gov and other websites.  How will I feel when I quit smoking? Within the first 24 hours of quitting smoking, you may start to feel some withdrawal symptoms. These symptoms are usually most noticeable 2-3 days after quitting, but they usually do not last beyond 2-3 weeks. Changes or symptoms that you might experience include:  Mood swings.  Restlessness, anxiety, or irritation.  Difficulty concentrating.  Dizziness.  Strong cravings for sugary foods in addition to nicotine.  Mild weight gain.  Constipation.  Nausea.  Coughing or a sore throat.  Changes in how your medicines work in your body.  A depressed mood.  Difficulty sleeping (insomnia).  After the first 2-3 weeks of quitting, you may start to notice more positive results, such as:  Improved sense of smell and taste.  Decreased coughing and sore throat.  Slower heart rate.  Lower blood pressure.  Clearer skin.  The ability to breathe more easily.  Fewer sick days.  Quitting smoking is very challenging for most people. Do not get discouraged if you are not successful the first time. Some people need to make many attempts to quit before they achieve long-term success. Do your best to stick to your quit plan, and talk with your health care provider if you have any questions or concerns. This information is not intended to replace advice given to you by your health care provider. Make sure you discuss any questions you have with your health care provider. Document Released: 07/07/2001 Document Revised: 03/10/2016 Document Reviewed: 11/27/2014 Elsevier Interactive Patient Education  [employer].

## 2018-02-15 ENCOUNTER — Telehealth: Payer: Self-pay

## 2018-02-15 ENCOUNTER — Other Ambulatory Visit: Payer: Self-pay | Admitting: Adult Health

## 2018-02-15 DIAGNOSIS — E871 Hypo-osmolality and hyponatremia: Secondary | ICD-10-CM

## 2018-02-15 LAB — CBC WITH DIFFERENTIAL/PLATELET
BASOS ABS: 133 {cells}/uL (ref 0–200)
Basophils Relative: 1.3 %
Eosinophils Absolute: 561 cells/uL — ABNORMAL HIGH (ref 15–500)
Eosinophils Relative: 5.5 %
HEMATOCRIT: 43.1 % (ref 35.0–45.0)
Hemoglobin: 14.7 g/dL (ref 11.7–15.5)
LYMPHS ABS: 2723 {cells}/uL (ref 850–3900)
MCH: 29.9 pg (ref 27.0–33.0)
MCHC: 34.1 g/dL (ref 32.0–36.0)
MCV: 87.8 fL (ref 80.0–100.0)
MPV: 10.3 fL (ref 7.5–12.5)
Monocytes Relative: 7.3 %
NEUTROS PCT: 59.2 %
Neutro Abs: 6038 cells/uL (ref 1500–7800)
Platelets: 269 10*3/uL (ref 140–400)
RBC: 4.91 10*6/uL (ref 3.80–5.10)
RDW: 11.9 % (ref 11.0–15.0)
Total Lymphocyte: 26.7 %
WBC: 10.2 10*3/uL (ref 3.8–10.8)
WBCMIX: 745 {cells}/uL (ref 200–950)

## 2018-02-15 LAB — LIPID PANEL
CHOL/HDL RATIO: 2.4 (calc) (ref <5.0)
CHOLESTEROL: 141 mg/dL (ref <200)
HDL: 59 mg/dL (ref >50)
LDL CHOLESTEROL (CALC): 59 mg/dL
Non-HDL Cholesterol (Calc): 82 mg/dL (calc) (ref <130)
Triglycerides: 148 mg/dL (ref <150)

## 2018-02-15 LAB — COMPLETE METABOLIC PANEL WITH GFR
AG Ratio: 1.3 (calc) (ref 1.0–2.5)
ALBUMIN MSPROF: 4.3 g/dL (ref 3.6–5.1)
ALT: 21 U/L (ref 6–29)
AST: 22 U/L (ref 10–35)
Alkaline phosphatase (APISO): 128 U/L (ref 33–130)
BUN: 12 mg/dL (ref 7–25)
CALCIUM: 9.9 mg/dL (ref 8.6–10.4)
CO2: 31 mmol/L (ref 20–32)
Chloride: 91 mmol/L — ABNORMAL LOW (ref 98–110)
Creat: 0.74 mg/dL (ref 0.50–0.99)
GFR, EST NON AFRICAN AMERICAN: 84 mL/min/{1.73_m2} (ref >=60)
GFR, Est African American: 98 mL/min/{1.73_m2} (ref >=60)
GLOBULIN: 3.2 g/dL (ref 1.9–3.7)
Glucose, Bld: 224 mg/dL — ABNORMAL HIGH (ref 65–99)
Potassium: 4.6 mmol/L (ref 3.5–5.3)
SODIUM: 128 mmol/L — AB (ref 135–146)
Total Bilirubin: 0.4 mg/dL (ref 0.2–1.2)
Total Protein: 7.5 g/dL (ref 6.1–8.1)

## 2018-02-15 LAB — HEMOGLOBIN A1C
HEMOGLOBIN A1C: 10.1 %{Hb} — AB (ref <5.7)
Mean Plasma Glucose: 243 (calc)
eAG (mmol/L): 13.5 (calc)

## 2018-02-15 LAB — VITAMIN D 25 HYDROXY (VIT D DEFICIENCY, FRACTURES): VIT D 25 HYDROXY: 20 ng/mL — AB (ref 30–100)

## 2018-02-15 LAB — TSH: TSH: 0.69 mIU/L (ref 0.40–4.50)

## 2018-02-15 LAB — MAGNESIUM: Magnesium: 1.9 mg/dL (ref 1.5–2.5)

## 2018-02-15 MED ORDER — ROSUVASTATIN CALCIUM 40 MG PO TABS: ORAL_TABLET | ORAL | 0 refills | Status: DC

## 2018-02-15 NOTE — Telephone Encounter (Signed)
The name of the other cholesterol medication is Rosuvastatin,61m, 1/2 to 1 tablet as directed on Monday & Friday.

## 2018-02-16 ENCOUNTER — Ambulatory Visit (INDEPENDENT_AMBULATORY_CARE_PROVIDER_SITE_OTHER): Payer: PPO | Admitting: Cardiology

## 2018-02-16 ENCOUNTER — Encounter: Payer: Self-pay | Admitting: Cardiology

## 2018-02-16 VITALS — BP 142/64 | HR 100 | Ht <= 58 in | Wt 136.6 lb

## 2018-02-16 DIAGNOSIS — I2510 Atherosclerotic heart disease of native coronary artery without angina pectoris: Secondary | ICD-10-CM | POA: Diagnosis not present

## 2018-02-16 DIAGNOSIS — R011 Cardiac murmur, unspecified: Secondary | ICD-10-CM | POA: Diagnosis not present

## 2018-02-16 DIAGNOSIS — F17200 Nicotine dependence, unspecified, uncomplicated: Secondary | ICD-10-CM | POA: Diagnosis not present

## 2018-02-16 DIAGNOSIS — I10 Essential (primary) hypertension: Secondary | ICD-10-CM | POA: Diagnosis not present

## 2018-02-16 DIAGNOSIS — E782 Mixed hyperlipidemia: Secondary | ICD-10-CM | POA: Diagnosis not present

## 2018-02-16 DIAGNOSIS — R072 Precordial pain: Secondary | ICD-10-CM

## 2018-02-16 DIAGNOSIS — I700 Atherosclerosis of aorta: Secondary | ICD-10-CM | POA: Diagnosis not present

## 2018-02-16 DIAGNOSIS — E1151 Type 2 diabetes mellitus with diabetic peripheral angiopathy without gangrene: Secondary | ICD-10-CM | POA: Diagnosis not present

## 2018-02-16 MED ORDER — METOPROLOL TARTRATE 50 MG PO TABS: ORAL_TABLET | ORAL | 0 refills | Status: DC

## 2018-02-16 NOTE — Patient Instructions (Addendum)
Medication Instructions:   NO CHANGE  Testing/Procedures:  Your physician has requested that you have an echocardiogram. Echocardiography is a painless test that uses sound waves to create images of your heart. It provides your doctor with information about the size and shape of your heart and how well your heart's chambers and valves are working. This procedure takes approximately one hour. There are no restrictions for this procedure.   Please arrive at the [facility] main entrance of [facility] at xx:xx AM (30-45 minutes prior to test start time)  [facility] [place], [place] [address] [phone]  Proceed to the [facility] Radiology Department (First Floor).  Please follow these instructions carefully (unless otherwise directed):  Hold all erectile dysfunction medications at least 48 hours prior to test.  On the Night Before the Test: . Drink plenty of water. . Do not consume any caffeinated/decaffeinated beverages or chocolate 12 hours prior to your test. . Do not take any antihistamines 12 hours prior to your test  On the Day of the Test: . Drink plenty of water. Do not drink any water within one hour of the test. . Do not eat any food 4 hours prior to the test. . You may take your regular medications prior to the test.       Take 50 mg of lopressor (metoprolol) one hour before the test. . HOLD Furosemide morning of the test.  After the Test: . Drink plenty of water. . After receiving IV contrast, you may experience a mild flushed feeling. This is normal. . On occasion, you may experience a mild rash up to 24 hours after the test. This is not dangerous. If this occurs, you can take Benadryl 25 mg and increase your fluid intake. . If you experience trouble breathing, this can be serious. If it is severe call 911 IMMEDIATELY. If it is mild, please call our office. . If you take any of these medications: Glipizide/Metformin, Avandament,  Glucavance, please do not take 48 hours after completing test.    Follow-Up:  Your physician recommends that you schedule a follow-up appointment in: [place]

## 2018-02-16 NOTE — Progress Notes (Signed)
PCP: Unk Pinto, MD  Clinic Note: Chief Complaint  Patient presents with  . New Patient (Initial Visit)    Chest pain, murmur    HPI: Sara Lamb is a 67 y.o. female with a PMH notable for DM-2, HTN, HLD, smoker (1/2 PPD) /COPD, NASH & Fam Hx of CAD who is being seen today for the evaluation of CHEST PAIN & MURMUR at the request of Vicie Mutters, PA-C.  Sara Lamb was last seen on 7/22 by Sara Comber, NP w/ c/o L-sided CP (EKG has mild ST elevation).  CP over ~2 wks: L chest & under axilla, lasts few min.  Multiple episodes / PM.  Associated with SOB.  Murmur heard on exam.  CT Scan showed aortic (& coronary) atherosclerosis.  Recent Hospitalizations: n/a  Studies Personally Reviewed - (if available, images/films reviewed: From Epic Chart or Care Everywhere)  10/2017: CT Chest -- Atherosclerotic calcification of the arterial vasculature including three-vessel involvement of the coronary arteries and thoracic Aorta.   Interval History: Sara Lamb presents here today to discuss the episode of chest discomfort that she had that sounded like is been off and on for the last couple weeks.  It seems like it is worse with deep inspiration and coughing, but is also associated with some shortness of breath.  Last week she had an episode where her blood pressure went down into the 80s over 60s and she felt very weak and fatigued.   She does not do a lot of activity as far as any exercise at home. Cannot walk due to R knee injury (crush injury - not candidate for replacement) - hard to walk to mailbox.  Besides the leg pain, however she is been noticing exertional dyspnea over the last couple weeks as well in addition to the chest pain that seems to come and go.  She describes it as a sharp chest pain that is left lateral chest & also along the mid axillary line.   She reports having been told that she has thickening of the mitral valve with a history of a murmur.  Apparently she had an  evaluation years ago.   Remainder of cardiac review of symptoms: No PND, orthopnea or edema. Occasional palpitations, and a lightheaded episode last week with a low blood pressures.  Otherwise no syncope or near syncope. No TIA/amaurosis fugax No melena, hematochezia, hematuria, or epstaxis. No claudication.  ROS: A comprehensive was performed. Review of Systems  Constitutional: Negative for fever and malaise/fatigue (Just at baseline does not do much).  HENT: Negative for congestion and nosebleeds.   Respiratory: Negative for cough and wheezing.   Musculoskeletal: Positive for back pain and joint pain. Negative for falls.  Neurological: Positive for dizziness (See HPI). Negative for focal weakness, weakness and headaches.  Endo/Heme/Allergies: Negative for environmental allergies.  Psychiatric/Behavioral: Negative for depression and memory loss. The patient is nervous/anxious and has insomnia.   All other systems reviewed and are negative.  I have reviewed and (if needed) personally updated the patient's problem list, medications, allergies, past medical and surgical history, social and family history.   Past Medical History:  Diagnosis Date  . Anxiety   . Anxiety disorder   . Cirrhosis ([place])   . COPD (chronic obstructive pulmonary disease) ([place])    followed by pcp--  last exacerbation 11/ 2017  . Depression    Patient denies  . Gastritis   . History of rib fracture    2004  . History of TIAs  x5-- last one 2017--  "gets real sharp stabbing pain over eye and speech screws up"  pt states takes excederin migraine and lies down (pt stated was told if she "if come to ER again they would send her to psych ward")  . Hyperlipidemia   . Hypertension   . OA (osteoarthritis)    back, hips, knees, feet  . Osteoporosis   . Polyp of colon, hyperplastic   . Pulmonary nodules    per CT chest 08-28-2016  -- bilateral upper lobe nodules  . Stroke ([place])   . Tubular adenoma of colon     . Type 2 diabetes mellitus ([place])    followed by pcp--  last A1c 9.1 on 08-01-2016  . UTI (urinary tract infection)   . Wears dentures     Past Surgical History:  Procedure Laterality Date  . BILATERAL SALPINGOOPHORECTOMY  1997   via Laparoscopy  . CHOLECYSTECTOMY N/A 09/30/2016   Procedure: LAPAROSCOPIC CHOLECYSTECTOMY;  Surgeon: Mickeal Skinner, MD;  Location: [facility];  Service: General;  Laterality: N/A;  . KNEE ARTHROSCOPY Right 11/2014  . VAGINAL HYSTERECTOMY  age 5    Current Meds  Medication Sig  . albuterol (PROVENTIL) (2.5 MG/3ML) 0.083% nebulizer solution Take 3 mLs (2.5 mg total) by nebulization every 6 (six) hours as needed for wheezing or shortness of breath.  . ALPRAZolam (XANAX) 1 MG tablet Take 1/2-1 tablet 2-3 x /day ONLY if needed for Anxiety Attack & please try to limit to 5 days /week to avoid addiction  . aspirin-acetaminophen-caffeine (EXCEDRIN MIGRAINE) 250-250-65 MG tablet Take by mouth every 6 (six) hours as needed for headache.  . budesonide-formoterol (SYMBICORT) 160-4.5 MCG/ACT inhaler Inhale 2 puffs into the lungs 2 (two) times daily.  . Cholecalciferol (VITAMIN D3) 1000 units CAPS Take 1 capsule by mouth daily.  . colesevelam (WELCHOL) 625 MG tablet Take by mouth every morning.  . cyclobenzaprine (FLEXERIL) 10 MG tablet Take 1 tablet (10 mg total) by mouth at bedtime as needed for muscle spasms.  . DiphenhydrAMINE HCl, Sleep, (SLEEP AID) 50 MG CAPS Take 1 capsule by mouth at bedtime.  . Diphenhydramine-APAP (PERCOGESIC) 12.5-325 MG TABS Take by mouth as needed.  . gabapentin (NEURONTIN) 100 MG capsule Take 1 capsule (100 mg total) by mouth every morning.  . gabapentin (NEURONTIN) 300 MG capsule Take 300 mg by mouth daily.  Marland Kitchen glimepiride (AMARYL) 4 MG tablet TAKE ONE TABLET BY MOUTH TWICE DAILY BEFORE LUNCH AND SUPPER  . Insulin Glargine (LANTUS SOLOSTAR) 100 UNIT/ML Solostar Pen Inject 16 Units into the skin at bedtime.  . Insulin  Pen Needle (CAREFINE PEN NEEDLES) 32G X 4 MM MISC Use 1x a day  . lisinopril (PRINIVIL,ZESTRIL) 40 MG tablet Take 1 tablet (40 mg total) by mouth daily.  . NEOMYCIN-POLYMYXIN-HYDROCORTISONE (CORTISPORIN) 1 % SOLN OTIC solution Place 3 drops into both ears 4 (four) times daily.  . rosuvastatin (CRESTOR) 40 MG tablet Take 1/2 tab twice weekly for cholesterol.    Allergies  Allergen Reactions  . Metformin And Related     "metformin caused mini-strokes" and ELEVATED BS and also GLYBURIDE, ONGLYZA, ACTOS, INVOKANA caused elevated BS  . Insulins Rash  . Janumet [Sitagliptin-Metformin Hcl] Rash    Shakes/ rash  . Penicillins Rash    Social History   Tobacco Use  . Smoking status: Current Every Day Smoker    Packs/day: 0.50    Years: 38.00    Pack years: 19.00    Types: Cigarettes  .  Smokeless tobacco: Never Used  Substance Use Topics  . Alcohol use: No    Alcohol/week: 0.0 oz  . Drug use: No   Social History   Social History Narrative  . Not on file    family history includes CAD in her maternal aunt, maternal uncle, and sister; CAD (age of onset: 28) in her mother; Cancer in her father; Coronary artery disease (age of onset: 74) in her brother; Diabetes in her brother, father, and mother; Heart attack in her maternal grandmother; Heart attack (age of onset: 5) in her sister; Hyperlipidemia in her brother, sister, and sister; Hypertension in her brother, father, and sister; Stomach cancer in her mother; Stroke in her mother.  Wt Readings from Last 3 Encounters:  02/16/18 136 lb 9.6 oz (62 kg)  02/14/18 136 lb 9.6 oz (62 kg)  10/27/17 141 lb 4 oz (64.1 kg)    PHYSICAL EXAM BP (!) 142/64 (BP Location: Right Arm)   Pulse 100   Ht 4' 10"  (1.473 m)   Wt 136 lb 9.6 oz (62 kg)   SpO2 95%   BMI 28.55 kg/m  Physical Exam  Constitutional: She is oriented to person, place, and time. She appears well-developed and well-nourished. No distress.  Wound.  Healthy-appearing  HENT:    Head: Normocephalic and atraumatic.  Mouth/Throat: No oropharyngeal exudate.  Eyes: Pupils are equal, round, and reactive to light. EOM are normal.  Wears glasses  Neck: Normal range of motion. Neck supple. No hepatojugular reflux and no JVD present. Carotid bruit is not present. No tracheal deviation present. No thyromegaly present.  Cardiovascular: Normal rate, regular rhythm, intact distal pulses and normal pulses.  Occasional extrasystoles are present. PMI is not displaced. Exam reveals no gallop and no friction rub.  Murmur heard. High-pitched blowing holosystolic murmur is present with a grade of 1/6 at the apex. Pulmonary/Chest: Effort normal and breath sounds normal. No respiratory distress. She has no wheezes. She has no rales.  Abdominal: Soft. Bowel sounds are normal. She exhibits no distension. There is no tenderness. There is no rebound.  Musculoskeletal: Normal range of motion. She exhibits edema (trivial).  Neurological: She is alert and oriented to person, place, and time. No cranial nerve deficit.  Psychiatric: She has a normal mood and affect. Her behavior is normal. Judgment and thought content normal.  Very anxious; seems a bit "slow" with cognition.  Vitals reviewed.    Adult ECG Report - not checked today   Rate: 90 ;  Rhythm: normal sinus rhythm and subtle ~<0.5 mm Inferior ST elevation - LIKELY no-specific.;   Narrative Interpretation: Grossly normal EKG   Other studies Reviewed: Additional studies/ records that were reviewed today include:  Recent Labs:  ORDERED TODAY  Lab Results  Component Value Date   CHOL 141 02/14/2018   HDL 59 02/14/2018   LDLCALC 59 02/14/2018   TRIG 148 02/14/2018   CHOLHDL 2.4 02/14/2018   Lab Results  Component Value Date   CREATININE 0.74 02/14/2018   BUN 12 02/14/2018   NA 128 (L) 02/14/2018   K 4.6 02/14/2018   CL 91 (L) 02/14/2018   CO2 31 02/14/2018   Lab Results  Component Value Date   WBC 10.2 02/14/2018   HGB  14.7 02/14/2018   HCT 43.1 02/14/2018   MCV 87.8 02/14/2018   PLT 269 02/14/2018    ASSESSMENT / PLAN: Problem List Items Addressed This Visit    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 No claudication symptoms.  Risk factor modification: SMOKING CESSATION & BP/Lipid / Glycemic control along with with weight loss.      Relevant Medications   metoprolol tartrate (LOPRESSOR) 50 MG tablet   Tobacco use disorder (Chronic)    another risk factor. Time constraints limited ability discussed.      Precordial chest pain    Notably to visit her symptoms are cardiac in nature, however with her risk factors and chronic aspiration on CT scan, I would like to clarify further.   Plan: We will check  Coronary CTA.        Relevant Orders   CT CORONARY MORPH W/CTA COR W/SCORE W/CA W/CM &/OR WO/CM   CT CORONARY FRACTIONAL FLOW RESERVE DATA PREP   CT CORONARY FRACTIONAL FLOW RESERVE FLUID ANALYSIS   Murmur    Reported history of mitral valve disease.  I do not hear much of the exam finding for MVP. --> Check 2D Echo.      Relevant Orders   ECHOCARDIOGRAM COMPLETE   Hyperlipidemia, mixed (Chronic)    She is on high-dose statin with diabetes and hypertension.  Based on risk we will check a lipid panel today as it has not been checked in a while.      Relevant Medications   metoprolol tartrate (LOPRESSOR) 50 MG tablet   Essential hypertension (Chronic)    Her blood pressure is high today, but and she had a hypotensive episode last week. Plan:Heart rate is also quite high reduce ACE inhibitor to 20 mg daily in anticipation of likely adding beta-blocker..      Relevant Medications   metoprolol tartrate (LOPRESSOR) 50 MG tablet   Coronary artery calcification seen on CAT scan - Primary (Chronic)    Coronary artery disease as indicated by coronary calcification the patient with significant family history of CAD as  well as risk factors including hypertension, hyperlipidemia, obesity and diabetes.  Would like to quantify and qualify the amount and extent of coronary disease.  With multivessel involvement, there is concern for possible.  Negative stress test.  Plan: Coronary CT angiogram with possible FFR.      Relevant Medications   metoprolol tartrate (LOPRESSOR) 50 MG tablet   Atherosclerosis of abdominal aorta (HCC) (Chronic)   Relevant Medications   metoprolol tartrate (LOPRESSOR) 50 MG tablet     I spent a total of 40 minutes with the patient and chart review. >  50% of the time was spent in direct patient consultation.   Current medicines are reviewed at length with the patient today.  (+/- concerns) - low BP The following changes have been made:  anticipate reducing ACE-I to allow for Beta Blocker.  Patient Instructions  Medication Instructions:   NO CHANGE  Testing/Procedures:  Your physician has requested that you have an echocardiogram. Echocardiography is a painless test that uses sound waves to create images of your heart. It provides your doctor with information about the size and shape of your heart and how well your heart's chambers and valves are working. This procedure takes approximately one hour. There are no restrictions for this procedure.   Please arrive at the [facility] main entrance of [facility] at xx:xx AM (30-45 minutes prior to test start time)  [facility] [place], [place] [address] [phone]  Proceed to the [facility] Radiology Department (First Floor).  Please follow these instructions carefully (unless otherwise directed):  Hold all erectile  dysfunction medications at least 48 hours prior to test.  On the Night Before the Test: . Drink plenty of water. . Do not consume any caffeinated/decaffeinated beverages or chocolate 12 hours prior to your test. . Do not take any antihistamines 12 hours prior to your  test  On the Day of the Test: . Drink plenty of water. Do not drink any water within one hour of the test. . Do not eat any food 4 hours prior to the test. . You may take your regular medications prior to the test.       Take 50 mg of lopressor (metoprolol) one hour before the test. . HOLD Furosemide morning of the test.  After the Test: . Drink plenty of water. . After receiving IV contrast, you may experience a mild flushed feeling. This is normal. . On occasion, you may experience a mild rash up to 24 hours after the test. This is not dangerous. If this occurs, you can take Benadryl 25 mg and increase your fluid intake. . If you experience trouble breathing, this can be serious. If it is severe call 911 IMMEDIATELY. If it is mild, please call our office. . If you take any of these medications: Glipizide/Metformin, Avandament, Glucavance, please do not take 48 hours after completing test.    Follow-Up:  Your physician recommends that you schedule a follow-up appointment in: [address]       Studies Ordered:   Orders Placed This Encounter  Procedures  . CT CORONARY MORPH W/CTA COR W/SCORE W/CA W/CM &/OR WO/CM  . CT CORONARY FRACTIONAL FLOW RESERVE DATA PREP  . CT CORONARY FRACTIONAL FLOW RESERVE FLUID ANALYSIS  . ECHOCARDIOGRAM COMPLETE      Glenetta Hew, M.D., M.S. Interventional Cardiologist    Pager # [phone] Phone # [phone] [address] [place], [place] [address]   Thank you for choosing Heartcare at [facility]!!

## 2018-02-17 ENCOUNTER — Encounter: Payer: Self-pay | Admitting: Cardiology

## 2018-02-17 NOTE — Assessment & Plan Note (Signed)
She is on high-dose statin with diabetes and hypertension.  Based on risk we will check a lipid panel today as it has not been checked in a while.

## 2018-02-17 NOTE — Assessment & Plan Note (Addendum)
Her blood pressure is high today, but and she had a hypotensive episode last week. Plan:Heart rate is also quite high reduce ACE inhibitor to 20 mg daily in anticipation of likely adding beta-blocker.Marland Kitchen

## 2018-02-17 NOTE — Assessment & Plan Note (Signed)
This typically increases her risk for CAD. No claudication symptoms.  Risk factor modification: SMOKING CESSATION & BP/Lipid / Glycemic control along with with weight loss.

## 2018-02-17 NOTE — Assessment & Plan Note (Signed)
another risk factor. Time constraints limited ability discussed.

## 2018-02-17 NOTE — Assessment & Plan Note (Signed)
Notably to visit her symptoms are cardiac in nature, however with her risk factors and chronic aspiration on CT scan, I would like to clarify further.   Plan: We will check  Coronary CTA.

## 2018-02-17 NOTE — Assessment & Plan Note (Signed)
Coronary artery disease as indicated by coronary calcification the patient with significant family history of CAD as well as risk factors including hypertension, hyperlipidemia, obesity and diabetes.  Would like to quantify and qualify the amount and extent of coronary disease.  With multivessel involvement, there is concern for possible.  Negative stress test.  Plan: Coronary CT angiogram with possible FFR.

## 2018-02-17 NOTE — Assessment & Plan Note (Signed)
Reported history of mitral valve disease.  I do not hear much of the exam finding for MVP. --> Check 2D Echo.

## 2018-02-22 ENCOUNTER — Encounter (HOSPITAL_COMMUNITY): Payer: Self-pay | Admitting: Emergency Medicine

## 2018-02-22 ENCOUNTER — Emergency Department (HOSPITAL_COMMUNITY)
Admission: EM | Admit: 2018-02-22 | Discharge: 2018-02-22 | Disposition: A | Discharge status: Home / Self Care | Payer: PPO | Attending: Emergency Medicine | Admitting: Emergency Medicine

## 2018-02-22 ENCOUNTER — Emergency Department (HOSPITAL_COMMUNITY): Payer: PPO

## 2018-02-22 ENCOUNTER — Other Ambulatory Visit: Payer: Self-pay

## 2018-02-22 DIAGNOSIS — I10 Essential (primary) hypertension: Secondary | ICD-10-CM | POA: Diagnosis not present

## 2018-02-22 DIAGNOSIS — F419 Anxiety disorder, unspecified: Secondary | ICD-10-CM | POA: Diagnosis not present

## 2018-02-22 DIAGNOSIS — R0602 Shortness of breath: Secondary | ICD-10-CM | POA: Diagnosis not present

## 2018-02-22 DIAGNOSIS — R079 Chest pain, unspecified: Secondary | ICD-10-CM | POA: Diagnosis not present

## 2018-02-22 DIAGNOSIS — R072 Precordial pain: Secondary | ICD-10-CM

## 2018-02-22 DIAGNOSIS — E1165 Type 2 diabetes mellitus with hyperglycemia: Secondary | ICD-10-CM | POA: Diagnosis not present

## 2018-02-22 DIAGNOSIS — R000 Tachycardia, unspecified: Secondary | ICD-10-CM | POA: Diagnosis not present

## 2018-02-22 DIAGNOSIS — J449 Chronic obstructive pulmonary disease, unspecified: Secondary | ICD-10-CM

## 2018-02-22 DIAGNOSIS — Z794 Long term (current) use of insulin: Secondary | ICD-10-CM | POA: Insufficient documentation

## 2018-02-22 DIAGNOSIS — E1159 Type 2 diabetes mellitus with other circulatory complications: Secondary | ICD-10-CM | POA: Diagnosis not present

## 2018-02-22 DIAGNOSIS — F17210 Nicotine dependence, cigarettes, uncomplicated: Secondary | ICD-10-CM | POA: Insufficient documentation

## 2018-02-22 DIAGNOSIS — Z79899 Other long term (current) drug therapy: Secondary | ICD-10-CM | POA: Diagnosis not present

## 2018-02-22 DIAGNOSIS — R0789 Other chest pain: Secondary | ICD-10-CM | POA: Diagnosis not present

## 2018-02-22 LAB — I-STAT TROPONIN, ED: Troponin i, poc: 0 ng/mL (ref 0.00–0.08)

## 2018-02-22 LAB — CBC
HEMATOCRIT: 37.2 % (ref 36.0–46.0)
HEMOGLOBIN: 12.9 g/dL (ref 12.0–15.0)
MCH: 29.9 pg (ref 26.0–34.0)
MCHC: 34.7 g/dL (ref 30.0–36.0)
MCV: 86.1 fL (ref 78.0–100.0)
Platelets: 208 10*3/uL (ref 150–400)
RBC: 4.32 MIL/uL (ref 3.87–5.11)
RDW: 11.9 % (ref 11.5–15.5)
WBC: 10.1 10*3/uL (ref 4.0–10.5)

## 2018-02-22 LAB — CBG MONITORING, ED: GLUCOSE-CAPILLARY: 325 mg/dL — AB (ref 70–99)

## 2018-02-22 LAB — BASIC METABOLIC PANEL
ANION GAP: 10 (ref 5–15)
BUN: 8 mg/dL (ref 8–23)
CHLORIDE: 100 mmol/L (ref 98–111)
CO2: 22 mmol/L (ref 22–32)
Calcium: 8.5 mg/dL — ABNORMAL LOW (ref 8.9–10.3)
Creatinine, Ser: 0.85 mg/dL (ref 0.44–1.00)
Glucose, Bld: 295 mg/dL — ABNORMAL HIGH (ref 70–99)
POTASSIUM: 4.1 mmol/L (ref 3.5–5.1)
SODIUM: 132 mmol/L — AB (ref 135–145)

## 2018-02-22 MED ORDER — IPRATROPIUM-ALBUTEROL 0.5-2.5 (3) MG/3ML IN SOLN
3.0000 mL | Freq: Once | RESPIRATORY_TRACT | Status: AC
  Administered 2018-02-22: 3 mL via RESPIRATORY_TRACT
  Filled 2018-02-22: qty 3

## 2018-02-22 MED ORDER — FAMOTIDINE 20 MG PO TABS: 20.0000 mg | ORAL_TABLET | Freq: Two times a day (BID) | ORAL | 0 refills | Status: DC

## 2018-02-22 MED ORDER — FAMOTIDINE 20 MG PO TABS
40.0000 mg | ORAL_TABLET | Freq: Once | ORAL | Status: AC
  Administered 2018-02-22: 40 mg via ORAL
  Filled 2018-02-22: qty 2

## 2018-02-22 MED ORDER — GI COCKTAIL ~~LOC~~
30.0000 mL | Freq: Once | ORAL | Status: AC
  Administered 2018-02-22: 30 mL via ORAL
  Filled 2018-02-22: qty 30

## 2018-02-22 MED ORDER — ALPRAZOLAM 0.5 MG PO TABS
0.5000 mg | ORAL_TABLET | Freq: Once | ORAL | Status: AC
  Administered 2018-02-22: 0.5 mg via ORAL
  Filled 2018-02-22: qty 1

## 2018-02-22 NOTE — ED Notes (Signed)
Patient transported to X-ray 

## 2018-02-22 NOTE — ED Provider Notes (Signed)
[facility] EMERGENCY DEPARTMENT Provider Note   CSN: [id number] Arrival date & time: 02/22/18  1737     History   Chief Complaint Chief Complaint  Patient presents with  . Chest Pain    HPI Sara Lamb is a 67 y.o. female.  HPI Patient has been having chest pain for several weeks.  Has been coming and going.  Sometimes it is sharp in the left chest.  Reports she is also getting episodes of tremor.  At these times sometimes she feels short of breath and starts getting tingling and numbness in her hands and her feet.  Patient has been seen by cardiology and has history of prior stress test without anomaly.  This week she is due to get coronary CT scan.  Patient does have history of COPD.  She is an ongoing smoker.  She reports despite feeling short of breath over the past week she has not been using her inhaler or nebulizer recently.  No Lower extremity swelling or calf pain. Past Medical History:  Diagnosis Date  . Anxiety   . Anxiety disorder   . Cirrhosis ([place])   . COPD (chronic obstructive pulmonary disease) ([place])    followed by pcp--  last exacerbation 11/ 2017  . Depression    Patient denies  . Gastritis   . History of rib fracture    2004  . 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 Hyperlipidemia   . Hypertension   . OA (osteoarthritis)    back, hips, knees, feet  . Osteoporosis   . Polyp of colon, hyperplastic   . Pulmonary nodules    per CT chest 08-28-2016  -- bilateral upper lobe nodules  . Stroke ([place])   . Tubular adenoma of colon   . Type 2 diabetes mellitus ([place])    followed by pcp--  last A1c 9.1 on 08-01-2016  . UTI (urinary tract infection)   . Wears dentures     Patient Active Problem List   Diagnosis Date Noted  . Coronary artery  calcification seen on CAT scan 02/16/2018  . Precordial chest pain 02/16/2018  . Murmur 02/16/2018  . Cognitive dysfunction 03/27/2017  . Liver cirrhosis secondary to NASH ([place]) 11/23/2016  . Osteoporosis 05/27/2016  . Atherosclerosis of abdominal aorta ([place]) 04/30/2016  . 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 COPD (chronic obstructive pulmonary disease) ([place]) 04/30/2015  . Tobacco use disorder 04/08/2015  . Poor compliance with medication 11/05/2014  . Medication management 12/28/2013  . Nonalcoholic hepatosteatosis   . Vitamin D deficiency   . Anxiety   . Essential hypertension   . Hyperlipidemia, mixed     Past Surgical History:  Procedure Laterality Date  . BILATERAL SALPINGOOPHORECTOMY  1997   via Laparoscopy  . CHOLECYSTECTOMY N/A 09/30/2016   Procedure: LAPAROSCOPIC CHOLECYSTECTOMY;  Surgeon: Mickeal Skinner, MD;  Location: [facility];  Service: General;  Laterality: N/A;  . KNEE ARTHROSCOPY Right 11/2014  . VAGINAL HYSTERECTOMY  age 56     OB History   None      Home Medications    Prior to Admission medications   Medication Sig Start Date End Date Taking? Authorizing Provider  albuterol (PROVENTIL) (  2.5 MG/3ML) 0.083% nebulizer solution Take 3 mLs (2.5 mg total) by nebulization every 6 (six) hours as needed for wheezing or shortness of breath. 05/13/16  Yes Vicie Mutters, PA-C  ALPRAZolam Duanne Moron) 1 MG tablet Take 1/2-1 tablet 2-3 x /day ONLY if needed for Anxiety Attack & please try to limit to 5 days /week to avoid addiction 02/14/18  Yes Liane Comber, NP  Cholecalciferol (VITAMIN D3) 1000 units CAPS Take 1 capsule by mouth daily.   Yes [provider]  colesevelam (WELCHOL) 625 MG tablet Take 625 mg by mouth every morning.    Yes [provider]  diphenhydrAMINE HCl, Sleep, (SLEEP AID) 50 MG CAPS Take 1 capsule by mouth at bedtime.    Yes [provider]  Diphenhydramine-APAP  (PERCOGESIC) 12.5-325 MG TABS Take 1 tablet by mouth 3 (three) times daily.    Yes [provider]  gabapentin (NEURONTIN) 100 MG capsule Take 1 capsule (100 mg total) by mouth every morning. 10/27/17  Yes Armbruster, Carlota Raspberry, MD  glimepiride (AMARYL) 4 MG tablet TAKE ONE TABLET BY MOUTH TWICE DAILY BEFORE LUNCH AND SUPPER 12/01/17  Yes Liane Comber, NP  Insulin Glargine (LANTUS SOLOSTAR) 100 UNIT/ML Solostar Pen Inject 16 Units into the skin at bedtime. Patient taking differently: Inject 10-16 Units into the skin at bedtime.  09/03/17  Yes Philemon Kingdom, MD  lisinopril (PRINIVIL,ZESTRIL) 40 MG tablet Take 1 tablet (40 mg total) by mouth daily. Patient taking differently: Take 20-40 mg by mouth daily.  05/25/17  Yes Vicie Mutters, PA-C  rosuvastatin (CRESTOR) 40 MG tablet Take 1/2 tab twice weekly for cholesterol. 02/15/18  Yes Liane Comber, NP  aspirin-acetaminophen-caffeine (EXCEDRIN MIGRAINE) 6036935051 MG tablet Take by mouth every 6 (six) hours as needed for headache.    [provider]  budesonide-formoterol (SYMBICORT) 160-4.5 MCG/ACT inhaler Inhale 2 puffs into the lungs 2 (two) times daily. Patient not taking: Reported on 02/22/2018 09/15/16   Vicie Mutters, PA-C  cyclobenzaprine (FLEXERIL) 10 MG tablet Take 1 tablet (10 mg total) by mouth at bedtime as needed for muscle spasms. 08/16/17   Vicie Mutters, PA-C  famotidine (PEPCID) 20 MG tablet Take 1 tablet (20 mg total) by mouth 2 (two) times daily. 02/22/18   Charlesetta Shanks, MD  gabapentin (NEURONTIN) 300 MG capsule Take 300 mg by mouth daily.    [provider]  Insulin Pen Needle (CAREFINE PEN NEEDLES) 32G X 4 MM MISC Use 1x a day 10/09/16   Philemon Kingdom, MD  metoprolol tartrate (LOPRESSOR) 50 MG tablet TAKE ONE HOUR PRIOR TO CT SCAN 02/16/18   Leonie Man, MD  NEOMYCIN-POLYMYXIN-HYDROCORTISONE (CORTISPORIN) 1 % SOLN OTIC solution Place 3 drops into both ears 4 (four) times daily. Patient not  taking: Reported on 02/22/2018 07/26/17   Vicie Mutters, PA-C    Family History Family History  Problem Relation Age of Onset  . Cancer Father        PROSTATE  . Diabetes Father   . Hypertension Father   . Hyperlipidemia Sister   . Hyperlipidemia Brother   . Hypertension Brother   . Diabetes Brother   . Coronary artery disease Brother 40       CABG   . Hyperlipidemia Sister   . Hypertension Sister   . Heart attack Sister 49  . CAD Sister   . Diabetes Mother   . Stroke Mother   . Stomach cancer Mother   . CAD Mother 62       PCI  . Heart  attack Maternal Grandmother   . CAD Maternal Uncle   . CAD Maternal Aunt   . Colon cancer Neg Hx   . Esophageal cancer Neg Hx   . Rectal cancer Neg Hx     Social History Social History   Tobacco Use  . Smoking status: Current Every Day Smoker    Packs/day: 0.50    Years: 38.00    Pack years: 19.00    Types: Cigarettes  . Smokeless tobacco: Never Used  Substance Use Topics  . Alcohol use: No    Alcohol/week: 0.0 oz  . Drug use: No     Allergies   Actos [pioglitazone]; Glyburide; Metformin and related; Onglyza [saxagliptin]; Insulins; Janumet [sitagliptin-metformin hcl]; and Penicillins   Review of Systems Review of Systems 10 Systems reviewed and are negative for acute change except as noted in the HPI.   Physical Exam Updated Vital Signs BP (!) 155/68   Pulse 94   Temp 98.3 F (36.8 C) (Oral)   Resp 16   Ht 4' 10"  (1.473 m)   Wt 62.1 kg (137 lb)   SpO2 100%   BMI 28.63 kg/m   Physical Exam  Constitutional: She is oriented to person, place, and time. She appears well-developed and well-nourished.  HENT:  Head: Normocephalic and atraumatic.  Eyes: Pupils are equal, round, and reactive to light. EOM are normal.  Neck: Neck supple.  Cardiovascular: Normal rate, regular rhythm, normal heart sounds and intact distal pulses.  Pulmonary/Chest: Effort normal and breath sounds normal.  Abdominal: Soft. Bowel  sounds are normal. She exhibits no distension. There is no tenderness.  Musculoskeletal: Normal range of motion. She exhibits no edema or tenderness.  Neurological: She is alert and oriented to person, place, and time. She has normal strength. Coordination normal. GCS eye subscore is 4. GCS verbal subscore is 5. GCS motor subscore is 6.  Skin: Skin is warm, dry and intact.  Psychiatric: She has a normal mood and affect.     ED Treatments / Results  Labs (all labs ordered are listed, but only abnormal results are displayed) Labs Reviewed  BASIC METABOLIC PANEL - Abnormal; Notable for the following components:      Result Value   Sodium 132 (*)    Glucose, Bld 295 (*)    Calcium 8.5 (*)    All other components within normal limits  CBG MONITORING, ED - Abnormal; Notable for the following components:   Glucose-Capillary 325 (*)    All other components within normal limits  CBC  I-STAT TROPONIN, ED    EKG EKG Interpretation  Date/Time:  Tuesday February 22 2018 17:53:26 EDT Ventricular Rate:  101 PR Interval:    QRS Duration: 93 QT Interval:  365 QTC Calculation: 474 R Axis:   25 Text Interpretation:  Sinus tachycardia Consider left atrial enlargement Low voltage, precordial leads possible inferior elevation aVF but low voltage, no comparison available. Confirmed by Charlesetta Shanks [id number]) on 02/22/2018 8:13:33 PM   Radiology Dg Chest 2 View  Result Date: 02/22/2018 CLINICAL DATA:  Chest pain beginning about 7 hours ago. Shortness of breath. Pain is left-sided. EXAM: CHEST - 2 VIEW COMPARISON:  05/13/2016 FINDINGS: Heart size is normal. There is aortic atherosclerosis. The patient has taken a poor inspiration. There is no evidence of infiltrate, collapse or effusion. Question pulmonary venous hypertension. No significant bone finding. IMPRESSION: Poor inspiration. Question venous hypertension. No frank edema. No focal pulmonary finding. Aortic atherosclerosis. Electronically Signed    By: Nelson Chimes  M.D.   On: 02/22/2018 18:42    Procedures Procedures (including critical care time)  Medications Ordered in ED Medications  ipratropium-albuterol (DUONEB) 0.5-2.5 (3) MG/3ML nebulizer solution 3 mL (3 mLs Nebulization Given 02/22/18 2118)  ALPRAZolam Duanne Moron) tablet 0.5 mg (0.5 mg Oral Given 02/22/18 2118)  gi cocktail (Maalox,Lidocaine,Donnatal) (30 mLs Oral Given 02/22/18 2118)  famotidine (PEPCID) tablet 40 mg (40 mg Oral Given 02/22/18 2118)     Initial Impression / Assessment and Plan / ED Course  I have reviewed the triage vital signs and the nursing notes.  Pertinent labs & imaging results that were available during my care of the patient were reviewed by me and considered in my medical decision making (see chart for details).     Final Clinical Impressions(s) / ED Diagnoses   Final diagnoses:  Precordial pain  Chronic obstructive pulmonary disease, unspecified COPD type ([place])  Anxiety   Patient had multiple symptoms upon presenting.  She has been having chest pain off and on for several weeks.  He is in the process of undergoing cardiac work-up and has had cardiology consultation.  She has had a negative stress test and is due for CT coronary study due to risk factors.  Tonight there is no elevation in troponin.  No acute ischemic pattern on EKG.  Patient has symptoms also of tremor that is coming and going.  This does appear to be anxiety related.  Patient takes Xanax on a as needed basis at home.  At the point times when she was more anxious she would exhibit shaking of the left arm and hand.  There was no change in her mental status or any other neurologic function at these times.  This would calm down with distraction.  Patient was given combination of Pepcid, GI cocktail, Xanax and nebulizer.  This improved her symptoms significantly.  Patient is alert and in no distress.  At this time feel she is stable for continued outpatient evaluation.  She is counseled to  use her nebulizer and inhaler as I feel that COPD may be a contributing factor to the patient feeling dyspneic at times and then becoming quite anxious with an element of hyperventilation.  We discussed symptoms of carpopedal spasm and patient endorse that this is consistent with what she experiences feeling tingling and numbness with spasming feelings in her hands and feet.  At this time, I feel patient stable for discharge.  She has ongoing and expeditious cardiology evaluation with further testing in 3 days. ED Discharge Orders        Ordered    famotidine (PEPCID) 20 MG tablet  2 times daily     02/22/18 2240       Charlesetta Shanks, MD 02/22/18 2325

## 2018-02-22 NOTE — ED Triage Notes (Signed)
Patient arrived via EMS from home. Complaints of chest pain occuring since 11am, woke up with pain in left chest. Called EMS at 3pm.. Took 325 mg aspirin at home. Refused to go to closer hospital. Pain initially 9/10. One nitro given, pain decreased to 7/10.

## 2018-02-22 NOTE — ED Triage Notes (Signed)
Patient states that she passed out when chest pain started.

## 2018-02-22 NOTE — ED Notes (Signed)
EDP Pfeifer  made aware of patient complaints.

## 2018-02-22 NOTE — Discharge Instructions (Addendum)
1.  Take Pepcid twice daily.  Use your nebulizer machine 3 times a day.  Take Xanax if needed. 2.  Go to your scheduled heart tests this week. 3.  Return to the emergency department if your symptoms worsen or change.

## 2018-02-24 HISTORY — PX: TRANSTHORACIC ECHOCARDIOGRAM: SHX275

## 2018-02-25 ENCOUNTER — Other Ambulatory Visit (HOSPITAL_COMMUNITY): Payer: PPO

## 2018-02-28 ENCOUNTER — Other Ambulatory Visit: Payer: Self-pay

## 2018-03-03 ENCOUNTER — Ambulatory Visit (HOSPITAL_COMMUNITY): Payer: PPO | Attending: Cardiovascular Disease

## 2018-03-03 ENCOUNTER — Other Ambulatory Visit: Payer: Self-pay

## 2018-03-03 DIAGNOSIS — E785 Hyperlipidemia, unspecified: Secondary | ICD-10-CM | POA: Diagnosis not present

## 2018-03-03 DIAGNOSIS — I340 Nonrheumatic mitral (valve) insufficiency: Secondary | ICD-10-CM | POA: Diagnosis not present

## 2018-03-03 DIAGNOSIS — E119 Type 2 diabetes mellitus without complications: Secondary | ICD-10-CM | POA: Insufficient documentation

## 2018-03-03 DIAGNOSIS — Z8249 Family history of ischemic heart disease and other diseases of the circulatory system: Secondary | ICD-10-CM | POA: Insufficient documentation

## 2018-03-03 DIAGNOSIS — R011 Cardiac murmur, unspecified: Secondary | ICD-10-CM | POA: Diagnosis not present

## 2018-03-03 DIAGNOSIS — I509 Heart failure, unspecified: Secondary | ICD-10-CM | POA: Insufficient documentation

## 2018-03-03 DIAGNOSIS — J449 Chronic obstructive pulmonary disease, unspecified: Secondary | ICD-10-CM | POA: Insufficient documentation

## 2018-03-03 DIAGNOSIS — G459 Transient cerebral ischemic attack, unspecified: Secondary | ICD-10-CM | POA: Insufficient documentation

## 2018-03-03 DIAGNOSIS — Z720 Tobacco use: Secondary | ICD-10-CM | POA: Insufficient documentation

## 2018-03-03 DIAGNOSIS — I110 Hypertensive heart disease with heart failure: Secondary | ICD-10-CM | POA: Diagnosis not present

## 2018-03-07 ENCOUNTER — Telehealth: Payer: Self-pay | Admitting: *Deleted

## 2018-03-07 MED ORDER — METOPROLOL TARTRATE 50 MG PO TABS: ORAL_TABLET | ORAL | 0 refills | Status: DC

## 2018-03-07 NOTE — Telephone Encounter (Signed)
SPOKE TO PATIENT RESULT ECHO .  PATIENT STATES SHE TOOK THE METOPROLOL  TABLET THAT WAS NEEDED FOR CARDIAC CTA INSTEAD   (BEFORE ECHO).    INFORMED PATIENT  CARDIAC CTA HAS NOT BEEN SCHEDULE YET AND ONCE SHE RECEIVED TIME AND DATE TAKE METOPROLOL TARTRATE 50 MG THE HOUR BEFORE  CARDIAC CTA  PATIENT VERBALIZED UNDERSTANDING

## 2018-03-17 ENCOUNTER — Other Ambulatory Visit: Payer: PPO

## 2018-03-17 DIAGNOSIS — E871 Hypo-osmolality and hyponatremia: Secondary | ICD-10-CM | POA: Diagnosis not present

## 2018-03-18 ENCOUNTER — Encounter: Payer: Self-pay | Admitting: Adult Health

## 2018-03-18 DIAGNOSIS — E871 Hypo-osmolality and hyponatremia: Secondary | ICD-10-CM | POA: Insufficient documentation

## 2018-03-18 LAB — BASIC METABOLIC PANEL WITH GFR
BUN: 15 mg/dL (ref 7–25)
CALCIUM: 9.5 mg/dL (ref 8.6–10.4)
CHLORIDE: 93 mmol/L — AB (ref 98–110)
CO2: 29 mmol/L (ref 20–32)
Creat: 0.83 mg/dL (ref 0.50–0.99)
GFR, EST AFRICAN AMERICAN: 85 mL/min/{1.73_m2} (ref >=60)
GFR, Est Non African American: 73 mL/min/{1.73_m2} (ref >=60)
GLUCOSE: 214 mg/dL — AB (ref 65–99)
POTASSIUM: 4.2 mmol/L (ref 3.5–5.3)
Sodium: 130 mmol/L — ABNORMAL LOW (ref 135–146)

## 2018-03-27 DIAGNOSIS — I2510 Atherosclerotic heart disease of native coronary artery without angina pectoris: Secondary | ICD-10-CM

## 2018-03-27 HISTORY — DX: Atherosclerotic heart disease of native coronary artery without angina pectoris: I25.10

## 2018-03-29 ENCOUNTER — Other Ambulatory Visit: Payer: Self-pay | Admitting: Adult Health

## 2018-03-29 DIAGNOSIS — F419 Anxiety disorder, unspecified: Secondary | ICD-10-CM

## 2018-04-04 ENCOUNTER — Encounter: Payer: Self-pay | Admitting: Cardiology

## 2018-04-04 ENCOUNTER — Ambulatory Visit (INDEPENDENT_AMBULATORY_CARE_PROVIDER_SITE_OTHER): Payer: PPO | Admitting: Cardiology

## 2018-04-04 VITALS — BP 132/68 | HR 96 | Ht 59.0 in | Wt 136.6 lb

## 2018-04-04 DIAGNOSIS — I200 Unstable angina: Secondary | ICD-10-CM | POA: Diagnosis not present

## 2018-04-04 DIAGNOSIS — I700 Atherosclerosis of aorta: Secondary | ICD-10-CM

## 2018-04-04 DIAGNOSIS — F17200 Nicotine dependence, unspecified, uncomplicated: Secondary | ICD-10-CM | POA: Diagnosis not present

## 2018-04-04 DIAGNOSIS — E1151 Type 2 diabetes mellitus with diabetic peripheral angiopathy without gangrene: Secondary | ICD-10-CM | POA: Diagnosis not present

## 2018-04-04 DIAGNOSIS — Z01818 Encounter for other preprocedural examination: Secondary | ICD-10-CM

## 2018-04-04 DIAGNOSIS — I2510 Atherosclerotic heart disease of native coronary artery without angina pectoris: Secondary | ICD-10-CM | POA: Diagnosis not present

## 2018-04-04 DIAGNOSIS — R011 Cardiac murmur, unspecified: Secondary | ICD-10-CM

## 2018-04-04 DIAGNOSIS — I10 Essential (primary) hypertension: Secondary | ICD-10-CM | POA: Diagnosis not present

## 2018-04-04 DIAGNOSIS — R072 Precordial pain: Secondary | ICD-10-CM

## 2018-04-04 DIAGNOSIS — E782 Mixed hyperlipidemia: Secondary | ICD-10-CM

## 2018-04-04 MED ORDER — ASPIRIN EC 81 MG PO TBEC: 81.0000 mg | DELAYED_RELEASE_TABLET | Freq: Every day | ORAL | 3 refills | Status: DC

## 2018-04-04 MED ORDER — BISOPROLOL FUMARATE 5 MG PO TABS: 2.5000 mg | ORAL_TABLET | Freq: Every day | ORAL | 6 refills | Status: DC

## 2018-04-04 MED ORDER — LISINOPRIL 10 MG PO TABS: 10.0000 mg | ORAL_TABLET | Freq: Every day | ORAL | 3 refills | Status: DC

## 2018-04-04 MED ORDER — NITROGLYCERIN 0.4 MG SL SUBL: 0.4000 mg | SUBLINGUAL_TABLET | SUBLINGUAL | 5 refills | Status: DC | PRN

## 2018-04-04 MED ORDER — ISOSORBIDE MONONITRATE ER 30 MG PO TB24: 30.0000 mg | ORAL_TABLET | Freq: Every day | ORAL | 6 refills | Status: DC

## 2018-04-04 NOTE — Patient Instructions (Addendum)
MEDICATION CHANGES   --- DECREASE LISINOPRIL 10 MG DAILY   ---- ISOSORBIDE MONO 30 MG ONE TABLET DAILY ----- BISOPROLOL 2.5 MG ONE TABLET DAILY  NITROGLYCERIN .4 MG TAB ONE TABLET SUBLINGUAL AS NEEDED FOR CHEST DISCOMFORT - UP TO 3 TABLETS FIVE MINUTE APART    WILL CANCEL CARDIAC CT    SCHEDULE  ON SEPT 17,2019 AT [facility] physician has requested that you have a cardiac catheterization. Cardiac catheterization is used to diagnose and/or treat various heart conditions. Doctors may recommend this procedure for a number of different reasons. The most common reason is to evaluate chest pain. Chest pain can be a symptom of coronary artery disease (CAD), and cardiac catheterization can show whether plaque is narrowing or blocking your heart's arteries. This procedure is also used to evaluate the valves, as well as measure the blood flow and oxygen levels in different parts of your heart. For further information please visit [email or web address]. Please follow instruction sheet, as given.    Your physician recommends that you schedule a follow-up appointment in: [place] [facility].      Norris Sharpes [place] [address] Dept: [phone] Loc: [phone]  ADEANA GRILLIOT  04/04/2018  You are scheduled for a Cardiac Catheterization on Tuesday, September 17 with Dr. Glenetta Hew.  1. Please arrive at the [facility] (Main Entrance A) at [facility]: [address] [place], [place] [address] at 5:30 AM (This time is two hours before your procedure to ensure your preparation). Free valet parking service is available.   Special note: Every effort is made to have your procedure done on time. Please understand that emergencies sometimes delay scheduled procedures.  2. Diet: Do not eat solid foods after midnight.   3. Labs: You will need to have blood  drawn TODAY. You do not need to be fasting.  4. Medication instructions in preparation for your procedure  DO NOT TAKE GLIMEPIRIDE THE MORNING OF THE PROCEDURE  Take only 8 units of insulin the night before your procedure. Do not take any insulin on the day of the procedure.    On the morning of your procedure, take your Aspirin 81 and any morning medicines NOT listed above.  You may use sips of water.  5. Plan for one night stay--bring personal belongings. 6. Bring a current list of your medications and current insurance cards. 7. You MUST have a responsible person to drive you home. 8. Someone MUST be with you the first 24 hours after you arrive home or your discharge will be delayed. 9. Please wear clothes that are easy to get on and off and wear slip-on shoes.  Thank you for allowing [place] to care for you!   -- [facility]

## 2018-04-04 NOTE — H&P (View-Only) (Signed)
PCP: Unk Pinto, MD  Clinic Note: Chief Complaint  Patient presents with  . Follow-up    Not all test done.  Coronary CTA is pending.  . Chest Pain    HPI: Sara Lamb is a 67 y.o. female with a PMH notable for DM-2, HTN, HLD, smoker (1/2 PPD) /COPD, NASH & Fam Hx of CAD who is being seen today for follow-up evaluation of CHEST PAIN & MURMUR She was initally seen on 02/16/18 at the request of Unk Pinto, MD.  [name] was   Recent Hospitalizations:   02/22/2018: ER visit with pre-cordial CP. L-sides, sharp. + Tremor. Neg Troponin. Given Xanax, Pepcid, GI Cocktail & Nebulizer. -- Hyperventilation episodes.    She reports that she passed out & husband "revived her"; having tremors with altered sensorium.  Studies Personally Reviewed - (if available, images/films reviewed: From Epic Chart or Care Everywhere)  10/2017: CT Chest -- Atherosclerotic calcification of the arterial vasculature including three-vessel involvement of the coronary arteries and thoracic Aorta.   Echo 03/13/18: EF 60-65%. No RWMA. Gr 2 DD.  Mod LA dilation,normal valves.  Cor CTA still pending.  Interval History: Sara Lamb presents here today for follow-up evaluation, unfortunately has not had her coronary CT angiogram done yet.  Echocardiogram mostly shows grade 2 diastolic dysfunction otherwise is normal.  She continues to have chest pain that occurs occasionally at rest but also sometimes with exertion doing jobs around the house or when she gets quite upset.  She says that actually the chest discomfort seems to be happening more frequently. It does occasionally get worse with deep inspiration or coughing, but can also be exacerbated with activity. She has not had any further drops in blood pressure fatigue. Unfortunately she is very limited when it comes activity because of her knee injury. She also has intermittent palpitations but no heart failure symptoms. With limited activity, is  difficult to tell how well she does with exertion, but seems to be noticing more symptoms and I last saw her.  She also is noting exertional dyspnea with more intensity.  Overall energy level seems to have stabilized out.  She has occasional palpitations but nothing prolonged.  She reports having been told that she has thickening of the mitral valve with a history of a murmur.  Apparently she had an evaluation years ago. Remainder of cardiac review of symptoms: No PND, orthopnea or edema. Occasional palpitations, and a lightheaded episode last week with a low blood pressures.  Otherwise no syncope or near syncope. No TIA/amaurosis fugax No melena, hematochezia, hematuria, or epstaxis. No claudication.  ROS: A comprehensive was performed. Review of Systems  Constitutional: Negative for fever and malaise/fatigue (Just at baseline does not do much).  HENT: Negative for congestion and nosebleeds.   Respiratory: Negative for cough and wheezing.   Cardiovascular: Positive for palpitations.  Gastrointestinal: Positive for abdominal pain (Intermittent right upper quadrant pains.).  Musculoskeletal: Positive for back pain and joint pain. Negative for falls.  Neurological: Positive for dizziness (See HPI). Negative for focal weakness, weakness and headaches.  Endo/Heme/Allergies: Negative for environmental allergies.  Psychiatric/Behavioral: Negative for depression and memory loss. The patient is nervous/anxious and has insomnia.   All other systems reviewed and are negative.  I have reviewed and (if needed) personally updated the patient's problem list, medications, allergies, past medical and surgical history, social and family history.   Past Medical History:  Diagnosis Date  . Anxiety   . Anxiety disorder   . Cirrhosis (  [place])   . COPD (chronic obstructive pulmonary disease) ([place])    followed by pcp--  last exacerbation 11/ 2017  . Coronary artery calcification seen on CT scan   .  Depression    Patient denies  . Gastritis   . History of rib fracture    2004  . 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 Hyperlipidemia   . Hypertension   . OA (osteoarthritis)    back, hips, knees, feet  . Osteoporosis   . Polyp of colon, hyperplastic   . Pulmonary nodules    per CT chest 08-28-2016  -- bilateral upper lobe nodules  . Stroke ([place])   . Tubular adenoma of colon   . Type 2 diabetes mellitus ([place])    followed by pcp--  last A1c 9.1 on 08-01-2016  . UTI (urinary tract infection)   . Wears dentures     Past Surgical History:  Procedure Laterality Date  . BILATERAL SALPINGOOPHORECTOMY  1997   via Laparoscopy  . CHOLECYSTECTOMY N/A 09/30/2016   Procedure: LAPAROSCOPIC CHOLECYSTECTOMY;  Surgeon: Mickeal Skinner, MD;  Location: [facility];  Service: General;  Laterality: N/A;  . KNEE ARTHROSCOPY Right 11/2014  . VAGINAL HYSTERECTOMY  age 31    Current Meds  Medication Sig  . albuterol (PROVENTIL) (2.5 MG/3ML) 0.083% nebulizer solution Take 3 mLs (2.5 mg total) by nebulization every 6 (six) hours as needed for wheezing or shortness of breath.  . ALPRAZolam (XANAX) 1 MG tablet TAKE 1/2 TO 1 TABLET BY MOUTH TWO OR THREE TIMES DAILY AS NEEDED FOR ANXIETY - limit TO FIVE DAYS PER WEEK  . budesonide-formoterol (SYMBICORT) 160-4.5 MCG/ACT inhaler Inhale 2 puffs into the lungs 2 (two) times daily. (Patient taking differently: Inhale 2 puffs into the lungs 2 (two) times daily as needed (for flares). )  . Cholecalciferol (VITAMIN D3) 1000 units CAPS Take 1,000 Units by mouth daily.   . colesevelam (WELCHOL) 625 MG tablet Take 625 mg by mouth every morning.   . cyclobenzaprine (FLEXERIL) 10 MG tablet Take 1 tablet (10 mg total) by mouth at bedtime as needed for  muscle spasms.  . diphenhydrAMINE HCl, Sleep, (SLEEP AID) 50 MG CAPS Take 50 mg by mouth at bedtime.   . Diphenhydramine-APAP (PERCOGESIC) 12.5-325 MG TABS Take 1 tablet by mouth 3 (three) times daily.   . famotidine (PEPCID) 20 MG tablet Take 1 tablet (20 mg total) by mouth 2 (two) times daily.  Marland Kitchen gabapentin (NEURONTIN) 100 MG capsule Take 1 capsule (100 mg total) by mouth every morning. (Patient taking differently: Take 100 mg by mouth daily as needed (for pain). )  . glimepiride (AMARYL) 4 MG tablet TAKE ONE TABLET BY MOUTH TWICE DAILY BEFORE LUNCH AND SUPPER  . Insulin Glargine (LANTUS SOLOSTAR) 100 UNIT/ML Solostar Pen Inject 16 Units into the skin at bedtime. (Patient taking differently: Inject 10-16 Units into the skin at bedtime. )  . Insulin Pen Needle (CAREFINE PEN NEEDLES) 32G X 4 MM MISC Use 1x a day  . rosuvastatin (CRESTOR) 40 MG tablet Take 1/2 tab twice weekly for cholesterol.  . [DISCONTINUED] lisinopril (PRINIVIL,ZESTRIL) 40 MG tablet Take 1 tablet (40 mg total) by mouth daily.    Allergies  Allergen Reactions  .  Actos [Pioglitazone] Other (See Comments)    Patient stated that it elevated her BGL  . Glyburide Other (See Comments)    Patient stated that it elevated her BGL  . Metformin And Related     "metformin caused mini-strokes" and ELEVATED BS and also GLYBURIDE, ONGLYZA, ACTOS, INVOKANA caused elevated BS  . Onglyza [Saxagliptin] Other (See Comments)    Patient stated that it elevated her BGL  . Insulins Rash  . Janumet [Sitagliptin-Metformin Hcl] Rash and Other (See Comments)    Shakes/ rash  . Penicillins Rash and Other (See Comments)    Welts, also Has patient had a PCN reaction causing immediate rash, facial/tongue/throat swelling, SOB or lightheadedness with hypotension: Yes Has patient had a PCN reaction causing severe rash involving mucus membranes or skin necrosis: No Has patient had a PCN reaction that required hospitalization: No Has patient had a PCN  reaction occurring within the last 10 years: No If all of the above answers are "NO", then may proceed with Cephalosporin use.     Social History   Tobacco Use  . Smoking status: Current Every Day Smoker    Packs/day: 0.50    Years: 38.00    Pack years: 19.00    Types: Cigarettes  . Smokeless tobacco: Never Used  Substance Use Topics  . Alcohol use: No    Alcohol/week: 0.0 standard drinks  . Drug use: No   Social History   Social History Narrative  . Not on file    family history includes CAD in her maternal aunt, maternal uncle, and sister; CAD (age of onset: 56) in her mother; Cancer in her father; Coronary artery disease (age of onset: 87) in her brother; Diabetes in her brother, father, and mother; Heart attack in her maternal grandmother; Heart attack (age of onset: 16) in her sister; Hyperlipidemia in her brother, sister, and sister; Hypertension in her brother, father, and sister; Stomach cancer in her mother; Stroke in her mother.  Wt Readings from Last 3 Encounters:  04/04/18 136 lb 9.6 oz (62 kg)  02/22/18 137 lb (62.1 kg)  02/16/18 136 lb 9.6 oz (62 kg)    PHYSICAL EXAM BP 132/68   Pulse 96   Ht 4' 11"  (1.499 m)   Wt 136 lb 9.6 oz (62 kg)   SpO2 99%   BMI 27.59 kg/m  Physical Exam  Constitutional: She is oriented to person, place, and time. She appears well-developed and well-nourished. No distress.  Wound.  Healthy-appearing  HENT:  Head: Normocephalic and atraumatic.  Mouth/Throat: No oropharyngeal exudate.  Eyes: Pupils are equal, round, and reactive to light. EOM are normal.  Wears glasses  Neck: Normal range of motion. Neck supple. No hepatojugular reflux and no JVD present. Carotid bruit is not present. No tracheal deviation present. No thyromegaly present.  Cardiovascular: Normal rate, regular rhythm, intact distal pulses and normal pulses.  Occasional extrasystoles are present. PMI is not displaced. Exam reveals no gallop and no friction rub.    Murmur heard. High-pitched blowing holosystolic murmur is present with a grade of 1/6 at the apex. Pulmonary/Chest: Effort normal and breath sounds normal. No respiratory distress. She has no wheezes. She has no rales.  Abdominal: Soft. Bowel sounds are normal. She exhibits no distension. There is no tenderness. There is no rebound.  Musculoskeletal: Normal range of motion. She exhibits edema (trivial).  Neurological: She is alert and oriented to person, place, and time. No cranial nerve deficit.  Psychiatric: She has a normal mood and  affect. Her behavior is normal. Judgment and thought content normal.  Very anxious; seems a bit "slow" with cognition.  Vitals reviewed.    Adult ECG Report - not checked today  None  Other studies Reviewed: Additional studies/ records that were reviewed today include:  Recent Labs:  ORDERED Last Visit. Lab Results  Component Value Date   CHOL 141 02/14/2018   HDL 59 02/14/2018   LDLCALC 59 02/14/2018   TRIG 148 02/14/2018   CHOLHDL 2.4 02/14/2018   Lab Results  Component Value Date   CREATININE 0.75 04/04/2018   BUN 16 04/04/2018   NA 128 (L) 04/04/2018   K 5.2 04/04/2018   CL 88 (L) 04/04/2018   CO2 24 04/04/2018   Lab Results  Component Value Date   WBC 8.9 04/04/2018   HGB 14.5 04/04/2018   HCT 43.4 04/04/2018   MCV 88 04/04/2018   PLT 243 04/04/2018    ASSESSMENT / PLAN: Problem List Items Addressed This Visit    Atherosclerosis of abdominal aorta (HCC) (Chronic)   Relevant Medications   lisinopril (PRINIVIL,ZESTRIL) 10 MG tablet   isosorbide mononitrate (IMDUR) 30 MG 24 hr tablet   nitroGLYCERIN (NITROSTAT) 0.4 MG SL tablet   bisoprolol (ZEBETA) 5 MG tablet   aspirin EC 81 MG tablet   Coronary artery calcification seen on CAT scan (Chronic)    Initial plan was coronary CT angiogram with possible FFR, but with now persistent worsening symptoms, we are going to proceed with cardiac catheterization directly.  Is on  intermittent dosing of statin.  -->  Reduced dose of ACE inhibitor and start beta-blocker plus Imdur. Also ensure that she is taking baby aspirin.      Relevant Medications   lisinopril (PRINIVIL,ZESTRIL) 10 MG tablet   isosorbide mononitrate (IMDUR) 30 MG 24 hr tablet   nitroGLYCERIN (NITROSTAT) 0.4 MG SL tablet   bisoprolol (ZEBETA) 5 MG tablet   aspirin EC 81 MG tablet   Other Relevant Orders   CBC (Completed)   Basic metabolic panel (Completed)   LEFT HEART CATHETERIZATION WITH CORONARY ANGIOGRAM   Essential hypertension (Chronic)    Stable blood pressure today.  Has had hypotension as well as hypertension. Plan: Reduce ACE inhibitor to 20 mg daily (she was also done it before).  Add bisoprolol 2.5 mg.      Relevant Medications   lisinopril (PRINIVIL,ZESTRIL) 10 MG tablet   isosorbide mononitrate (IMDUR) 30 MG 24 hr tablet   nitroGLYCERIN (NITROSTAT) 0.4 MG SL tablet   bisoprolol (ZEBETA) 5 MG tablet   aspirin EC 81 MG tablet   Hyperlipidemia, mixed (Chronic)    With hypertension and diabetes, basically has metabolic syndrome which places her high risk. Remains on statin, but is only taking 20 mg biweekly.  Lipid panel actually has LDL of 59 which is well controlled especially with her risk factors.  Continue current dose and dosing interval of Crestor      Relevant Medications   lisinopril (PRINIVIL,ZESTRIL) 10 MG tablet   isosorbide mononitrate (IMDUR) 30 MG 24 hr tablet   nitroGLYCERIN (NITROSTAT) 0.4 MG SL tablet   bisoprolol (ZEBETA) 5 MG tablet   aspirin EC 81 MG tablet   Murmur    Normal valves.  May have some aortic sclerosis, but no notable abnormalities on echo.      Precordial chest pain    Worsening symptoms somewhat concerning for progressive angina.  Plan for now is to proceed with catheterization as opposed to coronary CT angiogram.  Progressive angina (HCC) - Primary    Her symptoms do not seem typical, but she clearly has multivessel disease  on CT scan.  We discussed the options of going forward with a coronary CTA versus just simply going for heart catheterization which would be more definitive and potentially therapeutic.  Since she has continued to have symptoms and seem to be getting worse, I would probably prefer to proceed with cardiac catheterization instead of coronary CT angiogram.  PLAN: Decrease lisinopril dose to 20 mg daily, start bisoprolol 2.5 mg daily along with Imdur.  Also prescribed PRN nitroglycerin.  Start 81 mg aspirin   LEFT HEART CATHETERIZATION WITH CORONARY ANGIOGRAPHY AND POSSIBLE PERCUTANEOUS CORONARY INTERVENTION  Performing MD:  Glenetta Hew, M.D., M.S.  Procedure:   LEFT HEART CATHETERIZATION WITH CORONARY ANGIOGRAPHY AND POSSIBLE PERCUTANEOUS CORONARY INTERVENTION  The procedure with Risks/Benefits/Alternatives and Indications was reviewed with the patient.  All questions were answered.    Risks / Complications include, but not limited to: Death, MI, CVA/TIA, VF/VT (with defibrillation), Bradycardia (need for temporary pacer placement), contrast induced nephropathy, bleeding / bruising / hematoma / pseudoaneurysm, vascular or coronary injury (with possible emergent CT or Vascular Surgery), adverse medication reactions, infection.  Additional risks involving the use of radiation with the possibility of radiation burns and cancer were explained in detail.  The patient voice understanding and agree to proceed.         Relevant Medications   lisinopril (PRINIVIL,ZESTRIL) 10 MG tablet   isosorbide mononitrate (IMDUR) 30 MG 24 hr tablet   nitroGLYCERIN (NITROSTAT) 0.4 MG SL tablet   bisoprolol (ZEBETA) 5 MG tablet   aspirin EC 81 MG tablet   Other Relevant Orders   CBC (Completed)   Basic metabolic panel (Completed)   LEFT HEART CATHETERIZATION WITH CORONARY ANGIOGRAM   Tobacco use disorder (Chronic)    Will need to be more aggressive with smoking cessation counseling post-cath.      Type 2  diabetes mellitus with atherosclerosis of aorta (HCC) (Chronic)   Relevant Medications   lisinopril (PRINIVIL,ZESTRIL) 10 MG tablet   isosorbide mononitrate (IMDUR) 30 MG 24 hr tablet   nitroGLYCERIN (NITROSTAT) 0.4 MG SL tablet   bisoprolol (ZEBETA) 5 MG tablet   aspirin EC 81 MG tablet    Other Visit Diagnoses    Pre-op testing       Relevant Orders   CBC (Completed)   Basic metabolic panel (Completed)     I spent a total of 40 minutes with the patient and chart review. >  50% of the time was spent in direct patient consultation.   Current medicines are reviewed at length with the patient today.  (+/- concerns) - low BP The following changes have been made:  anticipate reducing ACE-I to allow for Beta Blocker.  Patient Instructions  MEDICATION CHANGES   --- DECREASE LISINOPRIL 10 MG DAILY   ---- ISOSORBIDE MONO 30 MG ONE TABLET DAILY ----- BISOPROLOL 2.5 MG ONE TABLET DAILY  NITROGLYCERIN .4 MG TAB ONE TABLET SUBLINGUAL AS NEEDED FOR CHEST DISCOMFORT - UP TO 3 TABLETS FIVE MINUTE APART   WILL CANCEL CARDIAC CT    SCHEDULE  ON SEPT 17,2019 AT [facility] physician has requested that you have a cardiac catheterization.   Your physician recommends that you schedule a follow-up appointment in: [place] [facility].  Studies Ordered:   Orders Placed This Encounter  Procedures  . CBC  . Basic metabolic panel  . LEFT HEART CATHETERIZATION WITH  CORONARY Illene Silver, M.D., M.S. Interventional Cardiologist    Pager # [phone] Phone # [phone] [address] [place], [place] [address]   Thank you for choosing [facility] at [facility]!!

## 2018-04-04 NOTE — Progress Notes (Signed)
PCP: Sara Pinto, MD  Clinic Note: Chief Complaint  Patient presents with  . Follow-up    Not all test done.  Coronary CTA is pending.  . Chest Pain    HPI: Sara Lamb is a 67 y.o. female with a PMH notable for DM-2, HTN, HLD, smoker (1/2 PPD) /COPD, NASH & Fam Hx of CAD who is being seen today for follow-up evaluation of CHEST PAIN & MURMUR She was initally seen on 02/16/18 at the request of Sara Pinto, MD.  Sara Lamb was   Recent Hospitalizations:   02/22/2018: ER visit with pre-cordial CP. L-sides, sharp. + Tremor. Neg Troponin. Given Xanax, Pepcid, GI Cocktail & Nebulizer. -- Hyperventilation episodes.    She reports that she passed out & husband "revived her"; having tremors with altered sensorium.  Studies Personally Reviewed - (if available, images/films reviewed: From Epic Chart or Care Everywhere)  10/2017: CT Chest -- Atherosclerotic calcification of the arterial vasculature including three-vessel involvement of the coronary arteries and thoracic Aorta.   Echo 03/13/18: EF 60-65%. No RWMA. Gr 2 DD.  Mod LA dilation,normal valves.  Cor CTA still pending.  Interval History: Sara Lamb presents here today for follow-up evaluation, unfortunately has not had her coronary CT angiogram done yet.  Echocardiogram mostly shows grade 2 diastolic dysfunction otherwise is normal.  She continues to have chest pain that occurs occasionally at rest but also sometimes with exertion doing jobs around the house or when she gets quite upset.  She says that actually the chest discomfort seems to be happening more frequently. It does occasionally get worse with deep inspiration or coughing, but can also be exacerbated with activity. She has not had any further drops in blood pressure fatigue. Unfortunately she is very limited when it comes activity because of her knee injury. She also has intermittent palpitations but no heart failure symptoms. With limited activity, is  difficult to tell how well she does with exertion, but seems to be noticing more symptoms and I last saw her.  She also is noting exertional dyspnea with more intensity.  Overall energy level seems to have stabilized out.  She has occasional palpitations but nothing prolonged.  She reports having been told that she has thickening of the mitral valve with a history of a murmur.  Apparently she had an evaluation years ago. Remainder of cardiac review of symptoms: No PND, orthopnea or edema. Occasional palpitations, and a lightheaded episode last week with a low blood pressures.  Otherwise no syncope or near syncope. No TIA/amaurosis fugax No melena, hematochezia, hematuria, or epstaxis. No claudication.  ROS: A comprehensive was performed. Review of Systems  Constitutional: Negative for fever and malaise/fatigue (Just at baseline does not do much).  HENT: Negative for congestion and nosebleeds.   Respiratory: Negative for cough and wheezing.   Cardiovascular: Positive for palpitations.  Gastrointestinal: Positive for abdominal pain (Intermittent right upper quadrant pains.).  Musculoskeletal: Positive for back pain and joint pain. Negative for falls.  Neurological: Positive for dizziness (See HPI). Negative for focal weakness, weakness and headaches.  Endo/Heme/Allergies: Negative for environmental allergies.  Psychiatric/Behavioral: Negative for depression and memory loss. The patient is nervous/anxious and has insomnia.   All other systems reviewed and are negative.  I have reviewed and (if needed) personally updated the patient's problem list, medications, allergies, past medical and surgical history, social and family history.   Past Medical History:  Diagnosis Date  . Anxiety   . Anxiety disorder   . Cirrhosis (  [place])   . COPD (chronic obstructive pulmonary disease) ([place])    followed by pcp--  last exacerbation 11/ 2017  . Coronary artery calcification seen on CT scan   .  Depression    Patient denies  . Gastritis   . History of rib fracture    2004  . 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 Hyperlipidemia   . Hypertension   . OA (osteoarthritis)    back, hips, knees, feet  . Osteoporosis   . Polyp of colon, hyperplastic   . Pulmonary nodules    per CT chest 08-28-2016  -- bilateral upper lobe nodules  . Stroke ([place])   . Tubular adenoma of colon   . Type 2 diabetes mellitus ([place])    followed by pcp--  last A1c 9.1 on 08-01-2016  . UTI (urinary tract infection)   . Wears dentures     Past Surgical History:  Procedure Laterality Date  . BILATERAL SALPINGOOPHORECTOMY  1997   via Laparoscopy  . CHOLECYSTECTOMY N/A 09/30/2016   Procedure: LAPAROSCOPIC CHOLECYSTECTOMY;  Surgeon: Mickeal Skinner, MD;  Location: [facility];  Service: General;  Laterality: N/A;  . KNEE ARTHROSCOPY Right 11/2014  . VAGINAL HYSTERECTOMY  age 36    Current Meds  Medication Sig  . albuterol (PROVENTIL) (2.5 MG/3ML) 0.083% nebulizer solution Take 3 mLs (2.5 mg total) by nebulization every 6 (six) hours as needed for wheezing or shortness of breath.  . ALPRAZolam (XANAX) 1 MG tablet TAKE 1/2 TO 1 TABLET BY MOUTH TWO OR THREE TIMES DAILY AS NEEDED FOR ANXIETY - limit TO FIVE DAYS PER WEEK  . budesonide-formoterol (SYMBICORT) 160-4.5 MCG/ACT inhaler Inhale 2 puffs into the lungs 2 (two) times daily. (Patient taking differently: Inhale 2 puffs into the lungs 2 (two) times daily as needed (for flares). )  . Cholecalciferol (VITAMIN D3) 1000 units CAPS Take 1,000 Units by mouth daily.   . colesevelam (WELCHOL) 625 MG tablet Take 625 mg by mouth every morning.   . cyclobenzaprine (FLEXERIL) 10 MG tablet Take 1 tablet (10 mg total) by mouth at bedtime as needed for  muscle spasms.  . diphenhydrAMINE HCl, Sleep, (SLEEP AID) 50 MG CAPS Take 50 mg by mouth at bedtime.   . Diphenhydramine-APAP (PERCOGESIC) 12.5-325 MG TABS Take 1 tablet by mouth 3 (three) times daily.   . famotidine (PEPCID) 20 MG tablet Take 1 tablet (20 mg total) by mouth 2 (two) times daily.  Marland Kitchen gabapentin (NEURONTIN) 100 MG capsule Take 1 capsule (100 mg total) by mouth every morning. (Patient taking differently: Take 100 mg by mouth daily as needed (for pain). )  . glimepiride (AMARYL) 4 MG tablet TAKE ONE TABLET BY MOUTH TWICE DAILY BEFORE LUNCH AND SUPPER  . Insulin Glargine (LANTUS SOLOSTAR) 100 UNIT/ML Solostar Pen Inject 16 Units into the skin at bedtime. (Patient taking differently: Inject 10-16 Units into the skin at bedtime. )  . Insulin Pen Needle (CAREFINE PEN NEEDLES) 32G X 4 MM MISC Use 1x a day  . rosuvastatin (CRESTOR) 40 MG tablet Take 1/2 tab twice weekly for cholesterol.  . [DISCONTINUED] lisinopril (PRINIVIL,ZESTRIL) 40 MG tablet Take 1 tablet (40 mg total) by mouth daily.    Allergies  Allergen Reactions  .  Actos [Pioglitazone] Other (See Comments)    Patient stated that it elevated her BGL  . Glyburide Other (See Comments)    Patient stated that it elevated her BGL  . Metformin And Related     "metformin caused mini-strokes" and ELEVATED BS and also GLYBURIDE, ONGLYZA, ACTOS, INVOKANA caused elevated BS  . Onglyza [Saxagliptin] Other (See Comments)    Patient stated that it elevated her BGL  . Insulins Rash  . Janumet [Sitagliptin-Metformin Hcl] Rash and Other (See Comments)    Shakes/ rash  . Penicillins Rash and Other (See Comments)    Welts, also Has patient had a PCN reaction causing immediate rash, facial/tongue/throat swelling, SOB or lightheadedness with hypotension: Yes Has patient had a PCN reaction causing severe rash involving mucus membranes or skin necrosis: No Has patient had a PCN reaction that required hospitalization: No Has patient had a PCN  reaction occurring within the last 10 years: No If all of the above answers are "NO", then may proceed with Cephalosporin use.     Social History   Tobacco Use  . Smoking status: Current Every Day Smoker    Packs/day: 0.50    Years: 38.00    Pack years: 19.00    Types: Cigarettes  . Smokeless tobacco: Never Used  Substance Use Topics  . Alcohol use: No    Alcohol/week: 0.0 standard drinks  . Drug use: No   Social History   Social History Narrative  . Not on file    family history includes CAD in her maternal aunt, maternal uncle, and sister; CAD (age of onset: 48) in her mother; Cancer in her father; Coronary artery disease (age of onset: 9) in her brother; Diabetes in her brother, father, and mother; Heart attack in her maternal grandmother; Heart attack (age of onset: 4) in her sister; Hyperlipidemia in her brother, sister, and sister; Hypertension in her brother, father, and sister; Stomach cancer in her mother; Stroke in her mother.  Wt Readings from Last 3 Encounters:  04/04/18 136 lb 9.6 oz (62 kg)  02/22/18 137 lb (62.1 kg)  02/16/18 136 lb 9.6 oz (62 kg)    PHYSICAL EXAM BP 132/68   Pulse 96   Ht 4' 11"  (1.499 m)   Wt 136 lb 9.6 oz (62 kg)   SpO2 99%   BMI 27.59 kg/m  Physical Exam  Constitutional: She is oriented to person, place, and time. She appears well-developed and well-nourished. No distress.  Wound.  Healthy-appearing  HENT:  Head: Normocephalic and atraumatic.  Mouth/Throat: No oropharyngeal exudate.  Eyes: Pupils are equal, round, and reactive to light. EOM are normal.  Wears glasses  Neck: Normal range of motion. Neck supple. No hepatojugular reflux and no JVD present. Carotid bruit is not present. No tracheal deviation present. No thyromegaly present.  Cardiovascular: Normal rate, regular rhythm, intact distal pulses and normal pulses.  Occasional extrasystoles are present. PMI is not displaced. Exam reveals no gallop and no friction rub.    Murmur heard. High-pitched blowing holosystolic murmur is present with a grade of 1/6 at the apex. Pulmonary/Chest: Effort normal and breath sounds normal. No respiratory distress. She has no wheezes. She has no rales.  Abdominal: Soft. Bowel sounds are normal. She exhibits no distension. There is no tenderness. There is no rebound.  Musculoskeletal: Normal range of motion. She exhibits edema (trivial).  Neurological: She is alert and oriented to person, place, and time. No cranial nerve deficit.  Psychiatric: She has a normal mood and  affect. Her behavior is normal. Judgment and thought content normal.  Very anxious; seems a bit "slow" with cognition.  Vitals reviewed.    Adult ECG Report - not checked today  None  Other studies Reviewed: Additional studies/ records that were reviewed today include:  Recent Labs:  ORDERED Last Visit. Lab Results  Component Value Date   CHOL 141 02/14/2018   HDL 59 02/14/2018   LDLCALC 59 02/14/2018   TRIG 148 02/14/2018   CHOLHDL 2.4 02/14/2018   Lab Results  Component Value Date   CREATININE 0.75 04/04/2018   BUN 16 04/04/2018   NA 128 (L) 04/04/2018   K 5.2 04/04/2018   CL 88 (L) 04/04/2018   CO2 24 04/04/2018   Lab Results  Component Value Date   WBC 8.9 04/04/2018   HGB 14.5 04/04/2018   HCT 43.4 04/04/2018   MCV 88 04/04/2018   PLT 243 04/04/2018    ASSESSMENT / PLAN: Problem List Items Addressed This Visit    Atherosclerosis of abdominal aorta (HCC) (Chronic)   Relevant Medications   lisinopril (PRINIVIL,ZESTRIL) 10 MG tablet   isosorbide mononitrate (IMDUR) 30 MG 24 hr tablet   nitroGLYCERIN (NITROSTAT) 0.4 MG SL tablet   bisoprolol (ZEBETA) 5 MG tablet   aspirin EC 81 MG tablet   Coronary artery calcification seen on CAT scan (Chronic)    Initial plan was coronary CT angiogram with possible FFR, but with now persistent worsening symptoms, we are going to proceed with cardiac catheterization directly.  Is on  intermittent dosing of statin.  -->  Reduced dose of ACE inhibitor and start beta-blocker plus Imdur. Also ensure that she is taking baby aspirin.      Relevant Medications   lisinopril (PRINIVIL,ZESTRIL) 10 MG tablet   isosorbide mononitrate (IMDUR) 30 MG 24 hr tablet   nitroGLYCERIN (NITROSTAT) 0.4 MG SL tablet   bisoprolol (ZEBETA) 5 MG tablet   aspirin EC 81 MG tablet   Other Relevant Orders   CBC (Completed)   Basic metabolic panel (Completed)   LEFT HEART CATHETERIZATION WITH CORONARY ANGIOGRAM   Essential hypertension (Chronic)    Stable blood pressure today.  Has had hypotension as well as hypertension. Plan: Reduce ACE inhibitor to 20 mg daily (she was also done it before).  Add bisoprolol 2.5 mg.      Relevant Medications   lisinopril (PRINIVIL,ZESTRIL) 10 MG tablet   isosorbide mononitrate (IMDUR) 30 MG 24 hr tablet   nitroGLYCERIN (NITROSTAT) 0.4 MG SL tablet   bisoprolol (ZEBETA) 5 MG tablet   aspirin EC 81 MG tablet   Hyperlipidemia, mixed (Chronic)    With hypertension and diabetes, basically has metabolic syndrome which places her high risk. Remains on statin, but is only taking 20 mg biweekly.  Lipid panel actually has LDL of 59 which is well controlled especially with her risk factors.  Continue current dose and dosing interval of Crestor      Relevant Medications   lisinopril (PRINIVIL,ZESTRIL) 10 MG tablet   isosorbide mononitrate (IMDUR) 30 MG 24 hr tablet   nitroGLYCERIN (NITROSTAT) 0.4 MG SL tablet   bisoprolol (ZEBETA) 5 MG tablet   aspirin EC 81 MG tablet   Murmur    Normal valves.  May have some aortic sclerosis, but no notable abnormalities on echo.      Precordial chest pain    Worsening symptoms somewhat concerning for progressive angina.  Plan for now is to proceed with catheterization as opposed to coronary CT angiogram.  Progressive angina (HCC) - Primary    Her symptoms do not seem typical, but she clearly has multivessel disease  on CT scan.  We discussed the options of going forward with a coronary CTA versus just simply going for heart catheterization which would be more definitive and potentially therapeutic.  Since she has continued to have symptoms and seem to be getting worse, I would probably prefer to proceed with cardiac catheterization instead of coronary CT angiogram.  PLAN: Decrease lisinopril dose to 20 mg daily, start bisoprolol 2.5 mg daily along with Imdur.  Also prescribed PRN nitroglycerin.  Start 81 mg aspirin   LEFT HEART CATHETERIZATION WITH CORONARY ANGIOGRAPHY AND POSSIBLE PERCUTANEOUS CORONARY INTERVENTION  Performing MD:  Glenetta Hew, M.D., M.S.  Procedure:   LEFT HEART CATHETERIZATION WITH CORONARY ANGIOGRAPHY AND POSSIBLE PERCUTANEOUS CORONARY INTERVENTION  The procedure with Risks/Benefits/Alternatives and Indications was reviewed with the patient.  All questions were answered.    Risks / Complications include, but not limited to: Death, MI, CVA/TIA, VF/VT (with defibrillation), Bradycardia (need for temporary pacer placement), contrast induced nephropathy, bleeding / bruising / hematoma / pseudoaneurysm, vascular or coronary injury (with possible emergent CT or Vascular Surgery), adverse medication reactions, infection.  Additional risks involving the use of radiation with the possibility of radiation burns and cancer were explained in detail.  The patient voice understanding and agree to proceed.         Relevant Medications   lisinopril (PRINIVIL,ZESTRIL) 10 MG tablet   isosorbide mononitrate (IMDUR) 30 MG 24 hr tablet   nitroGLYCERIN (NITROSTAT) 0.4 MG SL tablet   bisoprolol (ZEBETA) 5 MG tablet   aspirin EC 81 MG tablet   Other Relevant Orders   CBC (Completed)   Basic metabolic panel (Completed)   LEFT HEART CATHETERIZATION WITH CORONARY ANGIOGRAM   Tobacco use disorder (Chronic)    Will need to be more aggressive with smoking cessation counseling post-cath.      Type 2  diabetes mellitus with atherosclerosis of aorta (HCC) (Chronic)   Relevant Medications   lisinopril (PRINIVIL,ZESTRIL) 10 MG tablet   isosorbide mononitrate (IMDUR) 30 MG 24 hr tablet   nitroGLYCERIN (NITROSTAT) 0.4 MG SL tablet   bisoprolol (ZEBETA) 5 MG tablet   aspirin EC 81 MG tablet    Other Visit Diagnoses    Pre-op testing       Relevant Orders   CBC (Completed)   Basic metabolic panel (Completed)     I spent a total of 40 minutes with the patient and chart review. >  50% of the time was spent in direct patient consultation.   Current medicines are reviewed at length with the patient today.  (+/- concerns) - low BP The following changes have been made:  anticipate reducing ACE-I to allow for Beta Blocker.  Patient Instructions  MEDICATION CHANGES   --- DECREASE LISINOPRIL 10 MG DAILY   ---- ISOSORBIDE MONO 30 MG ONE TABLET DAILY ----- BISOPROLOL 2.5 MG ONE TABLET DAILY  NITROGLYCERIN .4 MG TAB ONE TABLET SUBLINGUAL AS NEEDED FOR CHEST DISCOMFORT - UP TO 3 TABLETS FIVE MINUTE APART   WILL CANCEL CARDIAC CT    SCHEDULE  ON SEPT 17,2019 AT [facility] physician has requested that you have a cardiac catheterization.   Your physician recommends that you schedule a follow-up appointment in: [place] [facility].  Studies Ordered:   Orders Placed This Encounter  Procedures  . CBC  . Basic metabolic panel  . LEFT HEART CATHETERIZATION WITH  CORONARY Illene Silver, M.D., M.S. Interventional Cardiologist    Pager # [phone] Phone # [phone] [address]. [place], [place] [address]   Thank you for choosing [facility] at [facility]!!

## 2018-04-05 ENCOUNTER — Encounter: Payer: Self-pay | Admitting: Cardiology

## 2018-04-05 LAB — CBC
Hematocrit: 43.4 % (ref 34.0–46.6)
Hemoglobin: 14.5 g/dL (ref 11.1–15.9)
MCH: 29.5 pg (ref 26.6–33.0)
MCHC: 33.4 g/dL (ref 31.5–35.7)
MCV: 88 fL (ref 79–97)
PLATELETS: 243 10*3/uL (ref 150–450)
RBC: 4.92 x10E6/uL (ref 3.77–5.28)
RDW: 12.1 % — ABNORMAL LOW (ref 12.3–15.4)
WBC: 8.9 10*3/uL (ref 3.4–10.8)

## 2018-04-05 LAB — BASIC METABOLIC PANEL
BUN / CREAT RATIO: 21 (ref 12–28)
BUN: 16 mg/dL (ref 8–27)
CHLORIDE: 88 mmol/L — AB (ref 96–106)
CO2: 24 mmol/L (ref 20–29)
Calcium: 9.6 mg/dL (ref 8.7–10.3)
Creatinine, Ser: 0.75 mg/dL (ref 0.57–1.00)
GFR calc Af Amer: 96 mL/min/{1.73_m2} (ref >59)
GFR calc non Af Amer: 83 mL/min/{1.73_m2} (ref >59)
GLUCOSE: 204 mg/dL — AB (ref 65–99)
Potassium: 5.2 mmol/L (ref 3.5–5.2)
SODIUM: 128 mmol/L — AB (ref 134–144)

## 2018-04-05 NOTE — Assessment & Plan Note (Signed)
Will need to be more aggressive with smoking cessation counseling post-cath.

## 2018-04-05 NOTE — Assessment & Plan Note (Signed)
Stable blood pressure today.  Has had hypotension as well as hypertension. Plan: Reduce ACE inhibitor to 20 mg daily (she was also done it before).  Add bisoprolol 2.5 mg.

## 2018-04-05 NOTE — Assessment & Plan Note (Signed)
With hypertension and diabetes, basically has metabolic syndrome which places her high risk. Remains on statin, but is only taking 20 mg biweekly.  Lipid panel actually has LDL of 59 which is well controlled especially with her risk factors.  Continue current dose and dosing interval of Crestor

## 2018-04-05 NOTE — Assessment & Plan Note (Signed)
Normal valves.  May have some aortic sclerosis, but no notable abnormalities on echo.

## 2018-04-05 NOTE — Assessment & Plan Note (Addendum)
Her symptoms do not seem typical, but she clearly has multivessel disease on CT scan.  We discussed the options of going forward with a coronary CTA versus just simply going for heart catheterization which would be more definitive and potentially therapeutic.  Since she has continued to have symptoms and seem to be getting worse, I would probably prefer to proceed with cardiac catheterization instead of coronary CT angiogram.  PLAN: Decrease lisinopril dose to 20 mg daily, start bisoprolol 2.5 mg daily along with Imdur.  Also prescribed PRN nitroglycerin.  Start 81 mg aspirin   LEFT HEART CATHETERIZATION WITH CORONARY ANGIOGRAPHY AND POSSIBLE PERCUTANEOUS CORONARY INTERVENTION  Performing MD:  Glenetta Hew, M.D., M.S.  Procedure:   LEFT HEART CATHETERIZATION WITH CORONARY ANGIOGRAPHY AND POSSIBLE PERCUTANEOUS CORONARY INTERVENTION  The procedure with Risks/Benefits/Alternatives and Indications was reviewed with the patient.  All questions were answered.    Risks / Complications include, but not limited to: Death, MI, CVA/TIA, VF/VT (with defibrillation), Bradycardia (need for temporary pacer placement), contrast induced nephropathy, bleeding / bruising / hematoma / pseudoaneurysm, vascular or coronary injury (with possible emergent CT or Vascular Surgery), adverse medication reactions, infection.  Additional risks involving the use of radiation with the possibility of radiation burns and cancer were explained in detail.  The patient voice understanding and agree to proceed.

## 2018-04-05 NOTE — Assessment & Plan Note (Addendum)
Initial plan was coronary CT angiogram with possible FFR, but with now persistent worsening symptoms, we are going to proceed with cardiac catheterization directly.  Is on intermittent dosing of statin.  -->  Reduced dose of ACE inhibitor and start beta-blocker plus Imdur. Also ensure that she is taking baby aspirin.

## 2018-04-05 NOTE — Assessment & Plan Note (Signed)
Worsening symptoms somewhat concerning for progressive angina.  Plan for now is to proceed with catheterization as opposed to coronary CT angiogram.

## 2018-04-11 ENCOUNTER — Ambulatory Visit (HOSPITAL_COMMUNITY): Payer: PPO

## 2018-04-11 ENCOUNTER — Telehealth: Payer: Self-pay | Admitting: *Deleted

## 2018-04-11 NOTE — Telephone Encounter (Signed)
Pt contacted pre-catheterization scheduled at [facility] for: Tuesday April 12, 2018 7:30 AM Verified arrival time and place: [place] Entrance A at: 5:30 AM  No solid food after midnight prior to cath, clear liquids until 5 AM day of procedure. Verified allergies in Epic  Hold: 1/2 Insulin PM prior to procedure. Glimepiride AM of procedure.   Except hold medications AM meds can be  taken pre-cath with sip of water including: ASA 81 mg AM   Confirmed patient has responsible person to drive home post procedure and for 24 hours after you arrive home: yes

## 2018-04-12 ENCOUNTER — Encounter (HOSPITAL_COMMUNITY): Payer: Self-pay | Admitting: Cardiology

## 2018-04-12 ENCOUNTER — Encounter (HOSPITAL_COMMUNITY)
Admission: RE | Disposition: A | Discharge status: Home / Self Care | Payer: Self-pay | Source: Ambulatory Visit | Attending: Cardiology

## 2018-04-12 ENCOUNTER — Ambulatory Visit (HOSPITAL_COMMUNITY)
Admission: RE | Admit: 2018-04-12 | Discharge: 2018-04-12 | Disposition: A | Discharge status: Home / Self Care | Payer: PPO | Source: Ambulatory Visit | Attending: Cardiology | Admitting: Cardiology

## 2018-04-12 DIAGNOSIS — Z87440 Personal history of urinary (tract) infections: Secondary | ICD-10-CM | POA: Diagnosis not present

## 2018-04-12 DIAGNOSIS — E785 Hyperlipidemia, unspecified: Secondary | ICD-10-CM

## 2018-04-12 DIAGNOSIS — Z8249 Family history of ischemic heart disease and other diseases of the circulatory system: Secondary | ICD-10-CM | POA: Insufficient documentation

## 2018-04-12 DIAGNOSIS — Z880 Allergy status to penicillin: Secondary | ICD-10-CM | POA: Diagnosis not present

## 2018-04-12 DIAGNOSIS — Z9889 Other specified postprocedural states: Secondary | ICD-10-CM | POA: Insufficient documentation

## 2018-04-12 DIAGNOSIS — I10 Essential (primary) hypertension: Secondary | ICD-10-CM | POA: Diagnosis present

## 2018-04-12 DIAGNOSIS — M1990 Unspecified osteoarthritis, unspecified site: Secondary | ICD-10-CM | POA: Diagnosis not present

## 2018-04-12 DIAGNOSIS — Z90710 Acquired absence of both cervix and uterus: Secondary | ICD-10-CM | POA: Diagnosis not present

## 2018-04-12 DIAGNOSIS — Z8261 Family history of arthritis: Secondary | ICD-10-CM | POA: Diagnosis not present

## 2018-04-12 DIAGNOSIS — E8881 Metabolic syndrome: Secondary | ICD-10-CM | POA: Insufficient documentation

## 2018-04-12 DIAGNOSIS — Z86010 Personal history of colonic polyps: Secondary | ICD-10-CM | POA: Diagnosis not present

## 2018-04-12 DIAGNOSIS — Z794 Long term (current) use of insulin: Secondary | ICD-10-CM | POA: Diagnosis not present

## 2018-04-12 DIAGNOSIS — Z833 Family history of diabetes mellitus: Secondary | ICD-10-CM | POA: Diagnosis not present

## 2018-04-12 DIAGNOSIS — Z888 Allergy status to other drugs, medicaments and biological substances status: Secondary | ICD-10-CM | POA: Diagnosis not present

## 2018-04-12 DIAGNOSIS — I2510 Atherosclerotic heart disease of native coronary artery without angina pectoris: Secondary | ICD-10-CM | POA: Diagnosis present

## 2018-04-12 DIAGNOSIS — Z9049 Acquired absence of other specified parts of digestive tract: Secondary | ICD-10-CM | POA: Insufficient documentation

## 2018-04-12 DIAGNOSIS — I25110 Atherosclerotic heart disease of native coronary artery with unstable angina pectoris: Secondary | ICD-10-CM | POA: Insufficient documentation

## 2018-04-12 DIAGNOSIS — J449 Chronic obstructive pulmonary disease, unspecified: Secondary | ICD-10-CM | POA: Insufficient documentation

## 2018-04-12 DIAGNOSIS — E119 Type 2 diabetes mellitus without complications: Secondary | ICD-10-CM | POA: Insufficient documentation

## 2018-04-12 DIAGNOSIS — E782 Mixed hyperlipidemia: Secondary | ICD-10-CM | POA: Insufficient documentation

## 2018-04-12 DIAGNOSIS — I200 Unstable angina: Secondary | ICD-10-CM

## 2018-04-12 DIAGNOSIS — Z90722 Acquired absence of ovaries, bilateral: Secondary | ICD-10-CM | POA: Insufficient documentation

## 2018-04-12 DIAGNOSIS — E1169 Type 2 diabetes mellitus with other specified complication: Secondary | ICD-10-CM | POA: Diagnosis present

## 2018-04-12 DIAGNOSIS — Z79899 Other long term (current) drug therapy: Secondary | ICD-10-CM | POA: Insufficient documentation

## 2018-04-12 DIAGNOSIS — Z823 Family history of stroke: Secondary | ICD-10-CM | POA: Insufficient documentation

## 2018-04-12 DIAGNOSIS — Z8673 Personal history of transient ischemic attack (TIA), and cerebral infarction without residual deficits: Secondary | ICD-10-CM | POA: Diagnosis not present

## 2018-04-12 DIAGNOSIS — F17210 Nicotine dependence, cigarettes, uncomplicated: Secondary | ICD-10-CM | POA: Insufficient documentation

## 2018-04-12 DIAGNOSIS — R072 Precordial pain: Secondary | ICD-10-CM | POA: Diagnosis present

## 2018-04-12 DIAGNOSIS — F329 Major depressive disorder, single episode, unspecified: Secondary | ICD-10-CM | POA: Diagnosis not present

## 2018-04-12 DIAGNOSIS — F419 Anxiety disorder, unspecified: Secondary | ICD-10-CM | POA: Insufficient documentation

## 2018-04-12 DIAGNOSIS — F17200 Nicotine dependence, unspecified, uncomplicated: Secondary | ICD-10-CM | POA: Diagnosis present

## 2018-04-12 DIAGNOSIS — K7460 Unspecified cirrhosis of liver: Secondary | ICD-10-CM | POA: Insufficient documentation

## 2018-04-12 DIAGNOSIS — Z01818 Encounter for other preprocedural examination: Secondary | ICD-10-CM

## 2018-04-12 HISTORY — PX: LEFT HEART CATH AND CORONARY ANGIOGRAPHY: CATH118249

## 2018-04-12 HISTORY — PX: CORONARY PRESSURE/FFR STUDY: CATH118243

## 2018-04-12 LAB — GLUCOSE, CAPILLARY
GLUCOSE-CAPILLARY: 186 mg/dL — AB (ref 70–99)
GLUCOSE-CAPILLARY: 222 mg/dL — AB (ref 70–99)

## 2018-04-12 LAB — POCT ACTIVATED CLOTTING TIME: Activated Clotting Time: 373 seconds

## 2018-04-12 SURGERY — LEFT HEART CATH AND CORONARY ANGIOGRAPHY
Anesthesia: LOCAL

## 2018-04-12 MED ORDER — VERAPAMIL HCL 2.5 MG/ML IV SOLN
INTRAVENOUS | Status: DC | PRN
  Administered 2018-04-12: 10 mL via INTRA_ARTERIAL

## 2018-04-12 MED ORDER — SODIUM CHLORIDE 0.9 % IV SOLN: 250.0000 mL | INTRAVENOUS | Status: DC | PRN

## 2018-04-12 MED ORDER — LIDOCAINE HCL (PF) 1 % IJ SOLN
INTRAMUSCULAR | Status: DC | PRN
  Administered 2018-04-12: 2 mL

## 2018-04-12 MED ORDER — IOHEXOL 350 MG/ML SOLN
INTRAVENOUS | Status: DC | PRN
  Administered 2018-04-12: 75 mL via INTRAVENOUS

## 2018-04-12 MED ORDER — ACETAMINOPHEN 325 MG PO TABS: 650.0000 mg | ORAL_TABLET | ORAL | Status: DC | PRN

## 2018-04-12 MED ORDER — VERAPAMIL HCL 2.5 MG/ML IV SOLN
INTRAVENOUS | Status: AC
  Filled 2018-04-12: qty 2

## 2018-04-12 MED ORDER — HEPARIN (PORCINE) IN NACL 1000-0.9 UT/500ML-% IV SOLN
INTRAVENOUS | Status: AC
  Filled 2018-04-12: qty 1000

## 2018-04-12 MED ORDER — SODIUM CHLORIDE 0.9 % IV SOLN
INTRAVENOUS | Status: DC
  Administered 2018-04-12: 07:00:00 via INTRAVENOUS

## 2018-04-12 MED ORDER — HEPARIN SODIUM (PORCINE) 1000 UNIT/ML IJ SOLN
INTRAMUSCULAR | Status: AC
  Filled 2018-04-12: qty 1

## 2018-04-12 MED ORDER — SODIUM CHLORIDE 0.9% FLUSH: 3.0000 mL | INTRAVENOUS | Status: DC | PRN

## 2018-04-12 MED ORDER — ADENOSINE (DIAGNOSTIC) 140MCG/KG/MIN
INTRAVENOUS | Status: DC | PRN
  Administered 2018-04-12: 140 ug/kg/min via INTRAVENOUS

## 2018-04-12 MED ORDER — HEPARIN SODIUM (PORCINE) 1000 UNIT/ML IJ SOLN
INTRAMUSCULAR | Status: DC | PRN
  Administered 2018-04-12: 4000 [IU] via INTRAVENOUS
  Administered 2018-04-12: 2000 [IU] via INTRAVENOUS

## 2018-04-12 MED ORDER — MIDAZOLAM HCL 2 MG/2ML IJ SOLN
INTRAMUSCULAR | Status: DC | PRN
  Administered 2018-04-12: 1 mg via INTRAVENOUS

## 2018-04-12 MED ORDER — ADENOSINE 12 MG/4ML IV SOLN
INTRAVENOUS | Status: AC
  Filled 2018-04-12: qty 16

## 2018-04-12 MED ORDER — ONDANSETRON HCL 4 MG/2ML IJ SOLN: 4.0000 mg | Freq: Four times a day (QID) | INTRAMUSCULAR | Status: DC | PRN

## 2018-04-12 MED ORDER — HEPARIN (PORCINE) IN NACL 1000-0.9 UT/500ML-% IV SOLN
INTRAVENOUS | Status: DC | PRN
  Administered 2018-04-12 (×2): 500 mL

## 2018-04-12 MED ORDER — LIDOCAINE HCL (PF) 1 % IJ SOLN
INTRAMUSCULAR | Status: AC
  Filled 2018-04-12: qty 30

## 2018-04-12 MED ORDER — MIDAZOLAM HCL 2 MG/2ML IJ SOLN
INTRAMUSCULAR | Status: AC
  Filled 2018-04-12: qty 2

## 2018-04-12 MED ORDER — SODIUM CHLORIDE 0.9 % IV SOLN: INTRAVENOUS | Status: AC

## 2018-04-12 MED ORDER — SODIUM CHLORIDE 0.9% FLUSH: 3.0000 mL | Freq: Two times a day (BID) | INTRAVENOUS | Status: DC

## 2018-04-12 SURGICAL SUPPLY — 14 items
CATH INFINITI 5FR ANG PIGTAIL (CATHETERS) ×2 IMPLANT
CATH LAUNCHER 6FR JR4 (CATHETERS) ×2 IMPLANT
CATH OPTITORQUE TIG 4.0 5F (CATHETERS) ×2 IMPLANT
DEVICE RAD COMP TR BAND LRG (VASCULAR PRODUCTS) ×2 IMPLANT
GLIDESHEATH SLEND A-KIT 6F 22G (SHEATH) ×2 IMPLANT
GUIDEWIRE INQWIRE 1.5J.035X260 (WIRE) ×1 IMPLANT
GUIDEWIRE PRESSURE COMET II (WIRE) ×2 IMPLANT
INQWIRE 1.5J .035X260CM (WIRE) ×2
KIT ESSENTIALS PG (KITS) ×2 IMPLANT
KIT HEART LEFT (KITS) ×2 IMPLANT
PACK CARDIAC CATHETERIZATION (CUSTOM PROCEDURE TRAY) ×2 IMPLANT
SHEATH PROBE COVER 6X72 (BAG) ×2 IMPLANT
TRANSDUCER W/STOPCOCK (MISCELLANEOUS) ×2 IMPLANT
TUBING CIL FLEX 10 FLL-RA (TUBING) ×2 IMPLANT

## 2018-04-12 NOTE — Interval H&P Note (Signed)
History and Physical Interval Note:  04/12/2018 7:16 AM  Sara Lamb  has presented today for surgery, with the diagnosis of angina.   The various methods of treatment have been discussed with the patient and family. After consideration of risks, benefits and other options for treatment, the patient has consented to  Procedure(s): LEFT HEART CATH AND CORONARY ANGIOGRAPHY (N/A) with possible PERCUTANEOUS CORONARY INTERVENTION as a surgical intervention .  The patient's history has been reviewed, patient examined, no change in status, stable for surgery.  I have reviewed the patient's chart and labs.  Questions were answered to the patient's satisfaction.    Cath Lab Visit (complete for each Cath Lab visit)  Clinical Evaluation Leading to the Procedure:   ACS: No.  Non-ACS:    Anginal Classification: CCS III  Anti-ischemic medical therapy: Maximal Therapy (2 or more classes of medications)  Non-Invasive Test Results: No non-invasive testing performed  Prior CABG: No previous CABG  Glenetta Hew

## 2018-04-12 NOTE — Research (Signed)
CADFEM Informed Consent   Subject Name: Sara Lamb  Subject met inclusion and exclusion criteria.  The informed consent form, study requirements and expectations were reviewed with the subject and questions and concerns were addressed prior to the signing of the consent form.  The subject verbalized understanding of the trail requirements.  The subject agreed to participate in the CADFEM trial and signed the informed consent.  The informed consent was obtained prior to performance of any protocol-specific procedures for the subject.  A copy of the signed informed consent was given to the subject and a copy was placed in the subject's medical record.  [name],[name] 04/12/2018, [id number]

## 2018-04-12 NOTE — Discharge Instructions (Signed)
° °  DRINK PLENTY OF FLUIDS FOR THE NEXT 2-3 DAYS TO KEEP HYDRATED.  Radial Site Care Refer to this sheet in the next few weeks. These instructions provide you with information about caring for yourself after your procedure. Your health care provider may also give you more specific instructions. Your treatment has been planned according to current medical practices, but problems sometimes occur. Call your health care provider if you have any problems or questions after your procedure. What can I expect after the procedure? After your procedure, it is typical to have the following:  Bruising at the radial site that usually fades within 1-2 weeks.  Blood collecting in the tissue (hematoma) that may be painful to the touch. It should usually decrease in size and tenderness within 1-2 weeks.  Follow these instructions at home:  Take medicines only as directed by your health care provider.  You may shower 24-48 hours after the procedure or as directed by your health care provider. Remove the bandage (dressing) and gently wash the site with plain soap and water. Pat the area dry with a clean towel. Do not rub the site, because this may cause bleeding.  Do not take baths, swim, or use a hot tub until your health care provider approves.  Check your insertion site every day for redness, swelling, or drainage.  Do not apply powder or lotion to the site.  Do not flex or bend the affected arm for 24 hours or as directed by your health care provider.  Do not push or pull heavy objects with the affected arm for 24 hours or as directed by your health care provider.  Do not lift over 10 lb (4.5 kg) for 5 days after your procedure or as directed by your health care provider.  Ask your health care provider when it is okay to: ? Return to work or school. ? Resume usual physical activities or sports. ? Resume sexual activity.  Do not drive home if you are discharged the same day as the procedure. Have  someone else drive you.  You may drive 24 hours after the procedure unless otherwise instructed by your health care provider.  Do not operate machinery or power tools for 24 hours after the procedure.  If your procedure was done as an outpatient procedure, which means that you went home the same day as your procedure, a responsible adult should be with you for the first 24 hours after you arrive home.  Keep all follow-up visits as directed by your health care provider. This is important. Contact a health care provider if:  You have a fever.  You have chills.  You have increased bleeding from the radial site. Hold pressure on the site. Get help right away if:  You have unusual pain at the radial site.  You have redness, warmth, or swelling at the radial site.  You have drainage (other than a small amount of blood on the dressing) from the radial site.  The radial site is bleeding, and the bleeding does not stop after 30 minutes of holding steady pressure on the site.  Your arm or hand becomes pale, cool, tingly, or numb. This information is not intended to replace advice given to you by your health care provider. Make sure you discuss any questions you have with your health care provider. Document Released: 08/15/2010 Document Revised: 12/19/2015 Document Reviewed: 01/29/2014 Elsevier Interactive Patient Education  2018 [employer].

## 2018-04-19 ENCOUNTER — Other Ambulatory Visit: Payer: Self-pay | Admitting: Physician Assistant

## 2018-04-19 MED ORDER — FLUCONAZOLE 150 MG PO TABS: 150.0000 mg | ORAL_TABLET | Freq: Every day | ORAL | 3 refills | Status: DC

## 2018-05-03 ENCOUNTER — Other Ambulatory Visit: Payer: Self-pay | Admitting: Adult Health

## 2018-05-03 DIAGNOSIS — F419 Anxiety disorder, unspecified: Secondary | ICD-10-CM

## 2018-05-04 ENCOUNTER — Ambulatory Visit (INDEPENDENT_AMBULATORY_CARE_PROVIDER_SITE_OTHER): Payer: PPO | Admitting: Cardiology

## 2018-05-04 ENCOUNTER — Encounter: Payer: Self-pay | Admitting: Cardiology

## 2018-05-04 VITALS — BP 110/78 | HR 73 | Ht <= 58 in | Wt 138.0 lb

## 2018-05-04 DIAGNOSIS — F17200 Nicotine dependence, unspecified, uncomplicated: Secondary | ICD-10-CM | POA: Diagnosis not present

## 2018-05-04 DIAGNOSIS — I2510 Atherosclerotic heart disease of native coronary artery without angina pectoris: Secondary | ICD-10-CM | POA: Diagnosis not present

## 2018-05-04 DIAGNOSIS — E785 Hyperlipidemia, unspecified: Secondary | ICD-10-CM | POA: Diagnosis not present

## 2018-05-04 DIAGNOSIS — I10 Essential (primary) hypertension: Secondary | ICD-10-CM

## 2018-05-04 DIAGNOSIS — E1169 Type 2 diabetes mellitus with other specified complication: Secondary | ICD-10-CM

## 2018-05-04 MED ORDER — LISINOPRIL 5 MG PO TABS: 5.0000 mg | ORAL_TABLET | Freq: Every day | ORAL | 3 refills | Status: DC

## 2018-05-04 MED ORDER — ASPIRIN EC 81 MG PO TBEC: 81.0000 mg | DELAYED_RELEASE_TABLET | Freq: Every day | ORAL | 3 refills | Status: DC

## 2018-05-04 NOTE — Assessment & Plan Note (Signed)
Blood pressure looks good today.  I recently reduce her ACE inhibitor to down but with having added bisoprolol, she is worried about her blood pressures being a little bit low.  We will drop down to 5 mg lisinopril.

## 2018-05-04 NOTE — Progress Notes (Signed)
PCP: Unk Pinto, MD  Clinic Note: Chief Complaint  Patient presents with  . Hospitalization Follow-up    Post cath follow-up  . Coronary Artery Disease    Nonobstructive    HPI: Sara Lamb is a 67 y.o. female with a PMH notable for DM-2, HTN, HLD, smoker (1/2 PPD) /COPD, NASH & Fam Hx of CAD who is being seen today for follow-up evaluation of CHEST PAIN & MURMUR She was initally seen on 02/16/18 at the request of Unk Pinto, MD.  Sara Lamb was   Recent Hospitalizations:   Cath  Studies Personally Reviewed - (if available, images/films reviewed: From Epic Chart or Care Everywhere)   2D echo March 03, 2018: Normal LV function.  EF 60 to 65%.  No RWMMA.  GRII DD.  Moderate LA dilation.  Cardiac Cath: pRCA ~60% - FFR 0.89. Otherwise mild LCx disease.  Mildly calcified vessels.  Normal LVEDP.  Normal EF ~65%.   Interval History: Sara Lamb presents here today for post-cath follow-up.  She was very appreciative of the staff and felt quite happy with the results.  She says she had one episode last week when she had some chest pain that she was not sure if she should take nitroglycerin for not.  This happened at rest in bed.  She was able to sleep through the night and no more pain in the morning. Otherwise, she continues to be pretty sedentary.  She is not having nearly the same chest discomfort that she was having prior to her cath. She does say that it is worse with deep inspiration and coughing and not so much with exertion. Blood pressure levels have been actually a little bit more stable, but today's pressure is little bit low and she had an episode where she was "acting drunk" because of low blood pressures. Very rare heart palpitations but nothing significant.  She still has exertional dyspnea, but is notably deconditioned.  Energy level has stabilized. No PND, orthopnea edema.  No syncope/near syncope but did have some very dizzy spells.  No TIA or amaurosis  fugax symptoms. No melena, hematochezia, epistaxis or hematuria. Overall energy level seems to have stabilized out.  She has occasional palpitations but nothing prolonged.  No right wrist pain No claudication.  ROS: A comprehensive was performed. Review of Systems  Constitutional: Negative for fever and malaise/fatigue (Just at baseline does not do much).  HENT: Negative for congestion and nosebleeds.   Respiratory: Negative for cough and wheezing.   Cardiovascular: Positive for palpitations (Better since starting bisoprolol).  Gastrointestinal: Positive for abdominal pain (Intermittent right upper quadrant pains.  She had a spell last week).  Genitourinary: Negative for hematuria.  Musculoskeletal: Positive for back pain and joint pain. Negative for falls.  Neurological: Positive for dizziness (See HPI). Negative for focal weakness, weakness and headaches.  Endo/Heme/Allergies: Negative for environmental allergies.  Psychiatric/Behavioral: Negative for depression and memory loss. The patient is nervous/anxious and has insomnia.   All other systems reviewed and are negative.  I have reviewed and (if needed) personally updated the patient's problem list, medications, allergies, past medical and surgical history, social and family history.   Past Medical History:  Diagnosis Date  . Anxiety   . Anxiety disorder   . Cirrhosis ([place])   . COPD (chronic obstructive pulmonary disease) ([place])    followed by pcp--  last exacerbation 11/ 2017  . Coronary artery calcification seen on CT scan   . Depression    Patient denies  .  Gastritis   . History of rib fracture    2004  . 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 Hyperlipidemia   . Hypertension   . OA (osteoarthritis)    back, hips, knees,  feet  . Osteoporosis   . Polyp of colon, hyperplastic   . Pulmonary nodules    per CT chest 08-28-2016  -- bilateral upper lobe nodules  . Stroke ([place])   . Tubular adenoma of colon   . Type 2 diabetes mellitus ([place])    followed by pcp--  last A1c 9.1 on 08-01-2016  . UTI (urinary tract infection)   . Wears dentures     Past Surgical History:  Procedure Laterality Date  . BILATERAL SALPINGOOPHORECTOMY  1997   via Laparoscopy  . CHOLECYSTECTOMY N/A 09/30/2016   Procedure: LAPAROSCOPIC CHOLECYSTECTOMY;  Surgeon: Mickeal Skinner, MD;  [facility]: [facility];  Service: General;  Laterality: N/A;  . INTRAVASCULAR PRESSURE WIRE/FFR STUDY N/A 04/12/2018   Procedure: INTRAVASCULAR PRESSURE WIRE/FFR STUDY;  Surgeon: Leonie Man, MD;  Location: [facility];  Service: Cardiovascular;  Laterality: N/A;  . KNEE ARTHROSCOPY Right 11/2014  . LEFT HEART CATH AND CORONARY ANGIOGRAPHY N/A 04/12/2018   Procedure: LEFT HEART CATH AND CORONARY ANGIOGRAPHY;  Surgeon: Leonie Man, MD;  Location: [facility];  Service: Cardiovascular;  Laterality: N/A;  . TRANSTHORACIC ECHOCARDIOGRAM  02/2018    Normal LV function.  EF 60 to 65%.  No RWMMA.  GRII DD.  Moderate LA dilation.  Marland Kitchen VAGINAL HYSTERECTOMY  age 32    Current Meds  Medication Sig  . albuterol (PROVENTIL) (2.5 MG/3ML) 0.083% nebulizer solution Take 3 mLs (2.5 mg total) by nebulization every 6 (six) hours as needed for wheezing or shortness of breath.  . ALPRAZolam (XANAX) 1 MG tablet TAKE 1/2 TO 1 TABLET BY MOUTH TWO OR THREE TIMES DAILY AS NEEDED FOR ANXIETY - limit TO FIVE DAYS PER WEEK  . aspirin-acetaminophen-caffeine (EXCEDRIN MIGRAINE) 250-250-65 MG tablet Take 1-2 tablets by mouth as needed for headache or migraine.   . B Complex-C-Folic Acid (STRESS FORMULA PO) Take 1 tablet by mouth 2 (two) times daily as needed (STRESS).  . bisoprolol (ZEBETA) 5 MG tablet Take 0.5 tablets (2.5 mg total) by mouth daily.    . budesonide-formoterol (SYMBICORT) 160-4.5 MCG/ACT inhaler Inhale 2 puffs into the lungs 2 (two) times daily. (Patient taking differently: Inhale 2 puffs into the lungs 2 (two) times daily as needed (for flares). )  . Cholecalciferol (VITAMIN D3) 1000 units CAPS Take 1,000 Units by mouth daily.   . colesevelam (WELCHOL) 625 MG tablet Take 625 mg by mouth every morning.   . cyclobenzaprine (FLEXERIL) 10 MG tablet Take 1 tablet (10 mg total) by mouth at bedtime as needed for muscle spasms.  . diphenhydrAMINE HCl, Sleep, (SLEEP AID) 50 MG CAPS Take 50 mg by mouth at bedtime.   . Diphenhydramine-APAP (PERCOGESIC) 12.5-325 MG TABS Take 1 tablet by mouth 3 (three) times daily.   . famotidine (PEPCID) 20 MG tablet Take 1 tablet (20 mg total) by mouth 2 (two) times daily. (Patient taking differently: Take 20 mg by mouth daily. )  . fluconazole (DIFLUCAN) 150 MG tablet Take 1 tablet (150 mg  total) by mouth daily.  Marland Kitchen gabapentin (NEURONTIN) 100 MG capsule Take 1 capsule (100 mg total) by mouth every morning. (Patient taking differently: Take 100 mg by mouth daily as needed (for pain). )  . gabapentin (NEURONTIN) 300 MG capsule Take 300 mg by mouth daily as needed (for pain).   Marland Kitchen glimepiride (AMARYL) 4 MG tablet TAKE ONE TABLET BY MOUTH TWICE DAILY BEFORE LUNCH AND SUPPER (Patient taking differently: Take 4 mg by mouth 2 (two) times daily. )  . Insulin Glargine (LANTUS SOLOSTAR) 100 UNIT/ML Solostar Pen Inject 16 Units into the skin at bedtime.  . Insulin Pen Needle (CAREFINE PEN NEEDLES) 32G X 4 MM MISC Use 1x a day  . isosorbide mononitrate (IMDUR) 30 MG 24 hr tablet Take 1 tablet (30 mg total) by mouth daily.  Marland Kitchen lisinopril (PRINIVIL,ZESTRIL) 5 MG tablet Take 1 tablet (5 mg total) by mouth daily.  . NEOMYCIN-POLYMYXIN-HYDROCORTISONE (CORTISPORIN) 1 % SOLN OTIC solution Place 3 drops into both ears 4 (four) times daily.  . nitroGLYCERIN (NITROSTAT) 0.4 MG SL tablet Place 1 tablet (0.4 mg total) under the  tongue every 5 (five) minutes as needed for chest pain.  . rosuvastatin (CRESTOR) 40 MG tablet Take 1/2 tab twice weekly for cholesterol. (Patient taking differently: Take 20 mg by mouth See admin instructions. Take 1/2 tab twice weekly for cholesterol. ONLY ON Monday AND Friday)  . [DISCONTINUED] lisinopril (PRINIVIL,ZESTRIL) 10 MG tablet Take 1 tablet (10 mg total) by mouth daily.    Allergies  Allergen Reactions  . Actos [Pioglitazone] Other (See Comments)    Patient stated that it elevated her BGL  . Glyburide Other (See Comments)    Patient stated that it elevated her BGL  . Metformin And Related     "metformin caused mini-strokes" and ELEVATED BS and also GLYBURIDE, ONGLYZA, ACTOS, INVOKANA caused elevated BS  . Onglyza [Saxagliptin] Other (See Comments)    Patient stated that it elevated her BGL  . Insulins Rash  . Janumet [Sitagliptin-Metformin Hcl] Rash and Other (See Comments)    Shakes/ rash  . Penicillins Rash and Other (See Comments)    Welts, also Has patient had a PCN reaction causing immediate rash, facial/tongue/throat swelling, SOB or lightheadedness with hypotension: Yes Has patient had a PCN reaction causing severe rash involving mucus membranes or skin necrosis: No Has patient had a PCN reaction that required hospitalization: No Has patient had a PCN reaction occurring within the last 10 years: No If all of the above answers are "NO", then may proceed with Cephalosporin use.     Social History   Tobacco Use  . Smoking status: Current Every Day Smoker    Packs/day: 0.50    Years: 38.00    Pack years: 19.00    Types: Cigarettes  . Smokeless tobacco: Never Used  Substance Use Topics  . Alcohol use: No    Alcohol/week: 0.0 standard drinks  . Drug use: No   Social History   Social History Narrative  . Not on file   Family History family history includes CAD in her maternal aunt, maternal uncle, and sister; CAD (age of onset: 26) in her mother; Cancer in  her father; Coronary artery disease (age of onset: 21) in her brother; Diabetes in her brother, father, and mother; Heart attack in her maternal grandmother; Heart attack (age of onset: 65) in her sister; Hyperlipidemia in her brother, sister, and sister; Hypertension in her brother, father, and sister; Stomach cancer in her mother; Stroke in her  mother.  Wt Readings from Last 3 Encounters:  05/04/18 138 lb (62.6 kg)  04/12/18 136 lb (61.7 kg)  04/04/18 136 lb 9.6 oz (62 kg)    PHYSICAL EXAM BP 110/78   Pulse 73   Ht 4' 10"  (1.473 m)   Wt 138 lb (62.6 kg)   SpO2 99%   BMI 28.84 kg/m  Physical Exam  Constitutional: She is oriented to person, place, and time. She appears well-developed and well-nourished. No distress.  Wound.  Healthy-appearing  HENT:  Head: Normocephalic and atraumatic.  Mouth/Throat: No oropharyngeal exudate.  Eyes: Pupils are equal, round, and reactive to light. EOM are normal.  Wears glasses  Neck: Normal range of motion. Neck supple. No hepatojugular reflux and no JVD present. Carotid bruit is not present. No tracheal deviation present. No thyromegaly present.  Cardiovascular: Normal rate, regular rhythm, intact distal pulses and normal pulses.  Occasional extrasystoles are present. PMI is not displaced. Exam reveals no gallop and no friction rub.  Murmur heard. High-pitched blowing holosystolic murmur is present with a grade of 1/6 at the apex. Pulmonary/Chest: Effort normal and breath sounds normal. No respiratory distress. She has no wheezes. She has no rales.  Abdominal: Soft. Bowel sounds are normal. She exhibits no distension. There is no tenderness. There is no rebound.  Musculoskeletal: Normal range of motion. She exhibits no edema (trivial).  Neurological: She is alert and oriented to person, place, and time.  Psychiatric: She has a normal mood and affect. Her behavior is normal. Judgment and thought content normal.  Very anxious; seems a bit "slow" with  cognition.  Vitals reviewed.    Adult ECG Report - not checked today  None  Other studies Reviewed: Additional studies/ records that were reviewed today include:  Recent Labs:  ORDERED Last Visit. Lab Results  Component Value Date   CHOL 141 02/14/2018   HDL 59 02/14/2018   LDLCALC 59 02/14/2018   TRIG 148 02/14/2018   CHOLHDL 2.4 02/14/2018   Lab Results  Component Value Date   CREATININE 0.75 04/04/2018   BUN 16 04/04/2018   NA 128 (L) 04/04/2018   K 5.2 04/04/2018   CL 88 (L) 04/04/2018   CO2 24 04/04/2018   Lab Results  Component Value Date   WBC 8.9 04/04/2018   HGB 14.5 04/04/2018   HCT 43.4 04/04/2018   MCV 88 04/04/2018   PLT 243 04/04/2018    ASSESSMENT / PLAN: Problem List Items Addressed This Visit    Coronary artery disease, non-occlusive: RCA 60%, FFR Negative. - Primary (Chronic)    We basically skipped a step of coronary CT angiogram which would have led to heart catheterization anyway.  60% RCA lesion not flow-limiting by FFR.  Not likely because of her chest pain.  She remains on intermittent statin plus WelChol with well-controlled lipids. We have recently started her on low-dose bisoprolol which seems of helped her palpitations as well.    I think we can probably see how she does try to wean her off of the Imdur.  If symptoms recur, go back on Imdur.  --Use this opportunity to explain to her that she must stop smoking.  We did discuss some options but did not spend over 3 minutes      Relevant Medications   aspirin EC 81 MG tablet   lisinopril (PRINIVIL,ZESTRIL) 5 MG tablet   Essential hypertension (Chronic)    Blood pressure looks good today.  I recently reduce her ACE inhibitor to down but  with having added bisoprolol, she is worried about her blood pressures being a little bit low.  We will drop down to 5 mg lisinopril.      Relevant Medications   aspirin EC 81 MG tablet   lisinopril (PRINIVIL,ZESTRIL) 5 MG tablet   Hyperlipidemia  associated with type 2 diabetes mellitus ([place]) (Chronic)    She remains on a very interesting regimen with 20 mg Crestor 2 days a week along with WelChol.  Her lipids are really well controlled.  At this point, I would not make any adjustments.      Relevant Medications   aspirin EC 81 MG tablet   lisinopril (PRINIVIL,ZESTRIL) 5 MG tablet   Tobacco use disorder (Chronic)    Smoking cessation instruction/counseling given:  counseled patient on the dangers of tobacco use, advised patient to stop smoking, and reviewed strategies to maximize success --would like to stop, but less interested now.  Probably would benefit from Wellbutrin to help with cravings.  Nicorette gum did not help. Will defer to PCP.         I spent a total of 40 minutes with the patient and chart review. >  50% of the time was spent in direct patient consultation.   Current medicines are reviewed at length with the patient today.  (+/- concerns) - low BP The following changes have been made:   Reducing ACE inhibitor further.  Also holding Imdur.  Continue beta-blocker.  Patient Instructions  Medication Instructions:  Your Physician recommend you make the following changes to your medication. -Start: Asprin 81 mg daily -Decrease: Lisinopril 5 mg  If you need a refill on your cardiac medications before your next appointment, please call your pharmacy.   Lab work: None   Testing/Procedures: None  Follow-Up: At [facility], you and your health needs are our priority.  As part of our continuing mission to provide you with exceptional heart care, we have created designated Provider Care Teams.  These Care Teams include your primary Cardiologist (physician) and Advanced Practice Providers (APPs -  Physician Assistants and Nurse Practitioners) who all work together to provide you with the care you need, when you need it. You will need a follow up appointment in 6 months.  Please call our office 2 months in advance  to schedule this appointment.  You may see Dr. Ellyn Hack or one of the following Advanced Practice Providers on your designated Care Team:   Rosaria Ferries, PA-C . Jory Sims, DNP, ANP  Any Other Special Instructions Will Be Listed Below (If Applicable).     Studies Ordered:   No orders of the defined types were placed in this encounter.     Glenetta Hew, M.D., M.S. Interventional Cardiologist    Pager # [phone] Phone # [phone] [address]. [place], [place] [address]   Thank you for choosing [facility] at [facility]!!

## 2018-05-04 NOTE — Assessment & Plan Note (Signed)
She remains on a very interesting regimen with 20 mg Crestor 2 days a week along with WelChol.  Her lipids are really well controlled.  At this point, I would not make any adjustments.

## 2018-05-04 NOTE — Assessment & Plan Note (Addendum)
We basically skipped a step of coronary CT angiogram which would have led to heart catheterization anyway.  60% RCA lesion not flow-limiting by FFR.  Not likely because of her chest pain.  She remains on intermittent statin plus WelChol with well-controlled lipids. We have recently started her on low-dose bisoprolol which seems of helped her palpitations as well.    I think we can probably see how she does try to wean her off of the Imdur.  If symptoms recur, go back on Imdur.  --Use this opportunity to explain to her that she must stop smoking.  We did discuss some options but did not spend over 3 minutes

## 2018-05-04 NOTE — Patient Instructions (Signed)
Medication Instructions:  Your Physician recommend you make the following changes to your medication. -Start: Asprin 81 mg daily -Decrease: Lisinopril 5 mg  If you need a refill on your cardiac medications before your next appointment, please call your pharmacy.   Lab work: None   Testing/Procedures: None  Follow-Up: At [facility], you and your health needs are our priority.  As part of our continuing mission to provide you with exceptional heart care, we have created designated Provider Care Teams.  These Care Teams include your primary Cardiologist (physician) and Advanced Practice Providers (APPs -  Physician Assistants and Nurse Practitioners) who all work together to provide you with the care you need, when you need it. You will need a follow up appointment in 6 months.  Please call our office 2 months in advance to schedule this appointment.  You may see Dr. Ellyn Hack or one of the following Advanced Practice Providers on your designated Care Team:   Rosaria Ferries, PA-C . Jory Sims, DNP, ANP  Any Other Special Instructions Will Be Listed Below (If Applicable).

## 2018-05-04 NOTE — Assessment & Plan Note (Signed)
Smoking cessation instruction/counseling given:  counseled patient on the dangers of tobacco use, advised patient to stop smoking, and reviewed strategies to maximize success --would like to stop, but less interested now.  Probably would benefit from Wellbutrin to help with cravings.  Nicorette gum did not help. Will defer to PCP.

## 2018-05-18 ENCOUNTER — Encounter: Payer: Self-pay | Admitting: Internal Medicine

## 2018-05-18 ENCOUNTER — Ambulatory Visit (INDEPENDENT_AMBULATORY_CARE_PROVIDER_SITE_OTHER): Payer: PPO | Admitting: Internal Medicine

## 2018-05-18 ENCOUNTER — Other Ambulatory Visit: Payer: Self-pay | Admitting: *Deleted

## 2018-05-18 VITALS — BP 138/60 | HR 64 | Temp 97.8°F | Resp 16 | Ht <= 58 in | Wt 139.6 lb

## 2018-05-18 DIAGNOSIS — E559 Vitamin D deficiency, unspecified: Secondary | ICD-10-CM | POA: Diagnosis not present

## 2018-05-18 DIAGNOSIS — E782 Mixed hyperlipidemia: Secondary | ICD-10-CM

## 2018-05-18 DIAGNOSIS — K7460 Unspecified cirrhosis of liver: Secondary | ICD-10-CM

## 2018-05-18 DIAGNOSIS — Z79899 Other long term (current) drug therapy: Secondary | ICD-10-CM | POA: Diagnosis not present

## 2018-05-18 DIAGNOSIS — I700 Atherosclerosis of aorta: Principal | ICD-10-CM

## 2018-05-18 DIAGNOSIS — E119 Type 2 diabetes mellitus without complications: Secondary | ICD-10-CM

## 2018-05-18 DIAGNOSIS — K7581 Nonalcoholic steatohepatitis (NASH): Secondary | ICD-10-CM

## 2018-05-18 DIAGNOSIS — E1151 Type 2 diabetes mellitus with diabetic peripheral angiopathy without gangrene: Secondary | ICD-10-CM

## 2018-05-18 DIAGNOSIS — I2510 Atherosclerotic heart disease of native coronary artery without angina pectoris: Secondary | ICD-10-CM | POA: Diagnosis not present

## 2018-05-18 DIAGNOSIS — Z794 Long term (current) use of insulin: Secondary | ICD-10-CM

## 2018-05-18 DIAGNOSIS — I10 Essential (primary) hypertension: Secondary | ICD-10-CM | POA: Diagnosis not present

## 2018-05-18 MED ORDER — GLIMEPIRIDE 4 MG PO TABS: ORAL_TABLET | ORAL | 1 refills | Status: DC

## 2018-05-18 NOTE — Patient Instructions (Signed)

## 2018-05-18 NOTE — Progress Notes (Signed)
This very nice 67 y.o. MWF presents for 3 month follow up with HTN, HLD, Pre-Diabetes and Vitamin D Deficiency.      Patient is treated for HTN circa 1980 & BP has been controlled at home. Today's BP is at goal - 138/60. Patient had recent non ischemic  heart cath by Dr harding with  a 60% RCA lesion not flow-limiting by FFR.  She does continue to c/o non-exertional chest wall pains.  Patient has had no complaints of any palpitations, dyspnea / orthopnea / PND, dizziness, claudication, or dependent edema.     Hyperlipidemia is controlled with diet & meds. Patient denies myalgias or other med SE's. Last Lipids were at goal: Lab Results  Component Value Date   CHOL 121 05/18/2018   HDL 43 (L) 05/18/2018   LDLCALC 57 05/18/2018   TRIG 126 05/18/2018   CHOLHDL 2.8 05/18/2018      Also, the patient has history of poorly compliant & poorly controlled T2_DM predating from about 2000 and patient's lack of insight wrt her Diabetes is exceeded only by her lack of compliance and her indifference consistent with her poor control.  She has had no symptoms of reactive hypoglycemia, diabetic polys, paresthesias or visual blurring.  Last A1c was not at goal: Lab Results  Component Value Date   HGBA1C 10.3 (H) 05/18/2018      Further, the patient also has history of Vitamin D Deficiency and she refuses to supplement as repeatedly recommended. Last vitamin D was   Lab Results  Component Value Date   VD25OH 16 (L) 05/18/2018   Current Outpatient Medications on File Prior to Visit  Medication Sig  . albuterol (PROVENTIL) (2.5 MG/3ML) 0.083% nebulizer solution Take 3 mLs (2.5 mg total) by nebulization every 6 (six) hours as needed for wheezing or shortness of breath.  . ALPRAZolam (XANAX) 1 MG tablet TAKE 1/2 TO 1 TABLET BY MOUTH TWO OR THREE TIMES DAILY AS NEEDED FOR ANXIETY - limit TO FIVE DAYS PER WEEK  . aspirin EC 81 MG tablet Take 1 tablet (81 mg total) by mouth daily.  Marland Kitchen  aspirin-acetaminophen-caffeine (EXCEDRIN MIGRAINE) 250-250-65 MG tablet Take 1-2 tablets by mouth as needed for headache or migraine.   . B Complex-C-Folic Acid (STRESS FORMULA PO) Take 1 tablet by mouth 2 (two) times daily as needed (STRESS).  . bisoprolol (ZEBETA) 5 MG tablet Take 0.5 tablets (2.5 mg total) by mouth daily.  . budesonide-formoterol (SYMBICORT) 160-4.5 MCG/ACT inhaler Inhale 2 puffs into the lungs 2 (two) times daily. (Patient taking differently: Inhale 2 puffs into the lungs 2 (two) times daily as needed (for flares). )  . Cholecalciferol (VITAMIN D3) 1000 units CAPS Take 1,000 Units by mouth daily.   . colesevelam (WELCHOL) 625 MG tablet Take 625 mg by mouth every morning.   . cyclobenzaprine (FLEXERIL) 10 MG tablet Take 1 tablet (10 mg total) by mouth at bedtime as needed for muscle spasms.  . diphenhydrAMINE HCl, Sleep, (SLEEP AID) 50 MG CAPS Take 50 mg by mouth at bedtime.   . Diphenhydramine-APAP (PERCOGESIC) 12.5-325 MG TABS Take 1 tablet by mouth 3 (three) times daily.   . famotidine (PEPCID) 20 MG tablet Take 1 tablet (20 mg total) by mouth 2 (two) times daily. (Patient taking differently: Take 20 mg by mouth daily. )  . fluconazole (DIFLUCAN) 150 MG tablet Take 1 tablet (150 mg total) by mouth daily.  Marland Kitchen gabapentin (NEURONTIN) 100 MG capsule Take 1 capsule (100 mg total)  by mouth every morning. (Patient taking differently: Take 100 mg by mouth daily as needed (for pain). )  . Insulin Glargine (LANTUS SOLOSTAR) 100 UNIT/ML Solostar Pen Inject 16 Units into the skin at bedtime.  . Insulin Pen Needle (CAREFINE PEN NEEDLES) 32G X 4 MM MISC Use 1x a day  . isosorbide mononitrate (IMDUR) 30 MG 24 hr tablet Take 1 tablet (30 mg total) by mouth daily.  Marland Kitchen lisinopril (PRINIVIL,ZESTRIL) 5 MG tablet Take 1 tablet (5 mg total) by mouth daily.  . nitroGLYCERIN (NITROSTAT) 0.4 MG SL tablet Place 1 tablet (0.4 mg total) under the tongue every 5 (five) minutes as needed for chest pain.  .  rosuvastatin (CRESTOR) 40 MG tablet Take 1/2 tab twice weekly for cholesterol. (Patient taking differently: Take 20 mg by mouth See admin instructions. Take 1/2 tab twice weekly for cholesterol. ONLY ON Monday AND Friday)   No current facility-administered medications on file prior to visit.    Allergies  Allergen Reactions  . Actos [Pioglitazone] Other (See Comments)    Patient stated that it elevated her BGL  . Glyburide Other (See Comments)    Patient stated that it elevated her BGL  . Metformin And Related     "metformin caused mini-strokes" and ELEVATED BS and also GLYBURIDE, ONGLYZA, ACTOS, INVOKANA caused elevated BS  . Onglyza [Saxagliptin] Other (See Comments)    Patient stated that it elevated her BGL  . Insulins Rash  . Janumet [Sitagliptin-Metformin Hcl] Rash and Other (See Comments)    Shakes/ rash  . Penicillins Rash and Other (See Comments)   PMHx:   Past Medical History:  Diagnosis Date  . Anxiety   . Anxiety disorder   . Cirrhosis ([place])   . COPD (chronic obstructive pulmonary disease) ([place])    followed by pcp--  last exacerbation 11/ 2017  . Coronary artery calcification seen on CT scan   . Depression    Patient denies  . Gastritis   . History of rib fracture    2004  . 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 Hyperlipidemia   . Hypertension   . OA (osteoarthritis)    back, hips, knees, feet  . Osteoporosis   . Polyp of colon, hyperplastic   . Pulmonary nodules    per CT chest 08-28-2016  -- bilateral upper lobe nodules  . Stroke ([place])   . Tubular adenoma of colon   . Type 2 diabetes mellitus ([place])    followed by pcp--  last A1c 9.1 on 08-01-2016  . UTI (urinary tract infection)   . Wears dentures    Immunization History  Administered Date(s)  Administered  . Hep A / Hep B 01/07/2017, 02/08/2017, 07/09/2017  . Td 06/26/2001  . Tdap 04/30/2015   Past Surgical History:  Procedure Laterality Date  . BILATERAL SALPINGOOPHORECTOMY  1997   via Laparoscopy  . CHOLECYSTECTOMY N/A 09/30/2016   Procedure: LAPAROSCOPIC CHOLECYSTECTOMY;  Surgeon: Mickeal Skinner, MD;  Location: [facility];  Service: General;  Laterality: N/A;  . INTRAVASCULAR PRESSURE WIRE/FFR STUDY N/A 04/12/2018   Procedure: INTRAVASCULAR PRESSURE WIRE/FFR STUDY;  Surgeon: Leonie Man, MD;  Location: [facility];  Service: Cardiovascular;  Laterality: N/A;  . KNEE ARTHROSCOPY Right  11/2014  . LEFT HEART CATH AND CORONARY ANGIOGRAPHY N/A 04/12/2018   Procedure: LEFT HEART CATH AND CORONARY ANGIOGRAPHY;  Surgeon: Leonie Man, MD;  Location: [facility];  Service: Cardiovascular;  Laterality: N/A;  . TRANSTHORACIC ECHOCARDIOGRAM  02/2018    Normal LV function.  EF 60 to 65%.  No RWMMA.  GRII DD.  Moderate LA dilation.  Marland Kitchen VAGINAL HYSTERECTOMY  age 69   FHx:    Reviewed / unchanged  SHx:    Reviewed / unchanged   Systems Review:  Constitutional: Denies fever, chills, wt changes, headaches, insomnia, fatigue, night sweats, change in appetite. Eyes: Denies redness, blurred vision, diplopia, discharge, itchy, watery eyes.  ENT: Denies discharge, congestion, post nasal drip, epistaxis, sore throat, earache, hearing loss, dental pain, tinnitus, vertigo, sinus pain, snoring.  CV: Denies chest pain, palpitations, irregular heartbeat, syncope, dyspnea, diaphoresis, orthopnea, PND, claudication or edema. Respiratory: denies cough, dyspnea, DOE, pleurisy, hoarseness, laryngitis, wheezing.  Gastrointestinal: Denies dysphagia, odynophagia, heartburn, reflux, water brash, abdominal pain or cramps, nausea, vomiting, bloating, diarrhea, constipation, hematemesis, melena, hematochezia  or hemorrhoids. Genitourinary: Denies dysuria, frequency,  urgency, nocturia, hesitancy, discharge, hematuria or flank pain. Musculoskeletal: Denies arthralgias, myalgias, stiffness, jt. swelling, pain, limping or strain/sprain.  Skin: Denies pruritus, rash, hives, warts, acne, eczema or change in skin lesion(s). Neuro: No weakness, tremor, incoordination, spasms, paresthesia or pain. Psychiatric: Denies confusion, memory loss or sensory loss. Endo: Denies change in weight, skin or hair change.  Heme/Lymph: No excessive bleeding, bruising or enlarged lymph nodes.  Physical Exam  BP 138/60   Pulse 64   Temp 97.8 F (36.6 C)   Resp 16   Ht 4' 10"  (1.473 m)   Wt 139 lb 9.6 oz (63.3 kg)   BMI 29.18 kg/m   Appears  well nourished, well groomed  and in no distress.  Eyes: PERRLA, EOMs, conjunctiva no swelling or erythema. Sinuses: No frontal/maxillary tenderness ENT/Mouth: EAC's clear, TM's nl w/o erythema, bulging. Nares clear w/o erythema, swelling, exudates. Oropharynx clear without erythema or exudates. Oral hygiene is good. Tongue normal, non obstructing. Hearing intact.  Neck: Supple. Thyroid not palpable. Car 2+/2+ without bruits, nodes or JVD. Chest: Respirations nl with BS clear & equal w/o rales, rhonchi, wheezing or stridor.  Cor: Heart sounds normal w/ regular rate and rhythm without sig. murmurs, gallops, clicks or rubs. Peripheral pulses normal and equal  without edema.  Abdomen: Soft & bowel sounds normal. Non-tender w/o guarding, rebound, hernias, masses or organomegaly.  Lymphatics: Unremarkable.  Musculoskeletal: Full ROM all peripheral extremities, joint stability, 5/5 strength and normal gait.  Skin: Warm, dry without exposed rashes, lesions or ecchymosis apparent.  Neuro: Cranial nerves intact, reflexes equal bilaterally. Sensory-motor testing grossly intact. Tendon reflexes grossly intact.  Pysch: Alert & oriented x 3.  Insight and judgement nl & appropriate. No ideations.  Assessment and Plan:  1. Essential  hypertension  - Continue medication, monitor blood pressure at home.  - Continue DASH diet.  Reminder to go to the ER if any CP,  SOB, nausea, dizziness, severe HA, changes vision/speech.  - CBC with Differential/Platelet - COMPLETE METABOLIC PANEL WITH GFR - Magnesium - TSH  2. Hyperlipidemia, mixed  - Continue diet/meds, exercise,& lifestyle modifications.  - Continue monitor periodic cholesterol/liver & renal functions   - Lipid panel - TSH  3. Insulin-requiring or dependent type II diabetes mellitus ([place])  - Continue diet, exercise,  - lifestyle modifications.  - Monitor appropriate labs.  - Hemoglobin A1c  4.  Vitamin D deficiency  - Continue supplementation.  - VITAMIN D 25 Hydroxyl  5. Non-occlusive coronary artery disease requiring drug therapy - Lipid panel  6. Liver cirrhosis secondary to NASH ([facility])  - COMPLETE METABOLIC PANEL WITH GFR  7. Medication management   - CBC with Differential/Platelet - COMPLETE METABOLIC PANEL WITH GFR - Magnesium - Lipid panel - TSH - Hemoglobin A1c - VITAMIN D 25 Hydroxyl       Discussed  regular exercise, BP monitoring, weight control to achieve/maintain BMI less than 25 and discussed med and SE's. Recommended labs to assess and monitor clinical status with further disposition pending results of labs. Over 30 minutes of exam, counseling, chart review was performed.

## 2018-05-19 LAB — CBC WITH DIFFERENTIAL/PLATELET
Basophils Absolute: 109 cells/uL (ref 0–200)
Basophils Relative: 1.1 %
EOS PCT: 4.2 %
Eosinophils Absolute: 416 cells/uL (ref 15–500)
HEMATOCRIT: 39.4 % (ref 35.0–45.0)
Hemoglobin: 13.4 g/dL (ref 11.7–15.5)
LYMPHS ABS: 2782 {cells}/uL (ref 850–3900)
MCH: 29.3 pg (ref 27.0–33.0)
MCHC: 34 g/dL (ref 32.0–36.0)
MCV: 86.2 fL (ref 80.0–100.0)
MPV: 10.4 fL (ref 7.5–12.5)
Monocytes Relative: 6.9 %
NEUTROS ABS: 5910 {cells}/uL (ref 1500–7800)
Neutrophils Relative %: 59.7 %
Platelets: 268 10*3/uL (ref 140–400)
RBC: 4.57 10*6/uL (ref 3.80–5.10)
RDW: 11.9 % (ref 11.0–15.0)
Total Lymphocyte: 28.1 %
WBC: 9.9 10*3/uL (ref 3.8–10.8)
WBCMIX: 683 {cells}/uL (ref 200–950)

## 2018-05-19 LAB — COMPLETE METABOLIC PANEL WITH GFR
AG RATIO: 1.4 (calc) (ref 1.0–2.5)
ALKALINE PHOSPHATASE (APISO): 113 U/L (ref 33–130)
ALT: 20 U/L (ref 6–29)
AST: 19 U/L (ref 10–35)
Albumin: 3.9 g/dL (ref 3.6–5.1)
BILIRUBIN TOTAL: 0.4 mg/dL (ref 0.2–1.2)
BUN: 13 mg/dL (ref 7–25)
CO2: 27 mmol/L (ref 20–32)
Calcium: 9.3 mg/dL (ref 8.6–10.4)
Chloride: 92 mmol/L — ABNORMAL LOW (ref 98–110)
Creat: 0.7 mg/dL (ref 0.50–0.99)
GFR, EST AFRICAN AMERICAN: 104 mL/min/{1.73_m2} (ref >=60)
GFR, Est Non African American: 90 mL/min/{1.73_m2} (ref >=60)
GLOBULIN: 2.7 g/dL (ref 1.9–3.7)
GLUCOSE: 142 mg/dL — AB (ref 65–99)
POTASSIUM: 4.6 mmol/L (ref 3.5–5.3)
SODIUM: 127 mmol/L — AB (ref 135–146)
TOTAL PROTEIN: 6.6 g/dL (ref 6.1–8.1)

## 2018-05-19 LAB — HEMOGLOBIN A1C
HEMOGLOBIN A1C: 10.3 %{Hb} — AB (ref <5.7)
Mean Plasma Glucose: 249 (calc)
eAG (mmol/L): 13.8 (calc)

## 2018-05-19 LAB — LIPID PANEL
Cholesterol: 121 mg/dL (ref <200)
HDL: 43 mg/dL — ABNORMAL LOW (ref >50)
LDL Cholesterol (Calc): 57 mg/dL (calc)
NON-HDL CHOLESTEROL (CALC): 78 mg/dL (ref <130)
TRIGLYCERIDES: 126 mg/dL (ref <150)
Total CHOL/HDL Ratio: 2.8 (calc) (ref <5.0)

## 2018-05-19 LAB — MAGNESIUM: MAGNESIUM: 1.7 mg/dL (ref 1.5–2.5)

## 2018-05-19 LAB — TSH: TSH: 0.67 m[IU]/L (ref 0.40–4.50)

## 2018-05-19 LAB — VITAMIN D 25 HYDROXY (VIT D DEFICIENCY, FRACTURES): Vit D, 25-Hydroxy: 16 ng/mL — ABNORMAL LOW (ref 30–100)

## 2018-06-13 ENCOUNTER — Telehealth: Payer: Self-pay

## 2018-06-13 DIAGNOSIS — K7460 Unspecified cirrhosis of liver: Secondary | ICD-10-CM

## 2018-06-13 MED ORDER — GABAPENTIN 100 MG PO CAPS: 100.0000 mg | ORAL_CAPSULE | ORAL | 1 refills | Status: DC

## 2018-06-13 NOTE — Telephone Encounter (Signed)
-----   Message from Larina Bras, [place] sent at 01/03/2018  9:59 AM EDT -----  From 01/02/18 labs.... Jan can you please let this patient know her US shows stable changes of cirrhosis. Her labs are also stable. Plan on repeating her labs and RUQ [place] and AFP in 6 months. Thanks  (so u/s and afp will be due around 07/04/18)

## 2018-06-13 NOTE — Telephone Encounter (Signed)
Yes if this is helping her pain we can refill it. Thanks

## 2018-06-13 NOTE — Telephone Encounter (Signed)
Per Dr. Molli Hazard to refill Gabapentin.

## 2018-06-13 NOTE — Telephone Encounter (Signed)
Called and spoke to pt.  Time to schedule her for RUQ Ultrasound and AFP in early December.   Patient is requesting a refill of gabapentin 139m - once daily. Ok to refill?

## 2018-06-14 NOTE — Telephone Encounter (Signed)
Called and scheduled RUQ ultrasound at [facility] for Tuesday, Dec. 10 at 10:00am, arr 9:45am. NPO 6 hours prior.  Spoke to pt and relayed information.  She expressed understanding and wrote down all information. Will go to the lab same day

## 2018-07-05 ENCOUNTER — Other Ambulatory Visit: Payer: PPO

## 2018-07-05 ENCOUNTER — Ambulatory Visit (HOSPITAL_COMMUNITY)
Admission: RE | Admit: 2018-07-05 | Discharge: 2018-07-05 | Disposition: A | Discharge status: Home / Self Care | Payer: PPO | Source: Ambulatory Visit | Attending: Gastroenterology | Admitting: Gastroenterology

## 2018-07-05 DIAGNOSIS — K7460 Unspecified cirrhosis of liver: Secondary | ICD-10-CM | POA: Diagnosis not present

## 2018-07-05 DIAGNOSIS — Z9049 Acquired absence of other specified parts of digestive tract: Secondary | ICD-10-CM | POA: Insufficient documentation

## 2018-07-06 ENCOUNTER — Encounter: Payer: Self-pay | Admitting: Gastroenterology

## 2018-07-06 LAB — AFP TUMOR MARKER: AFP TUMOR MARKER: 7.9 ng/mL — AB

## 2018-07-12 ENCOUNTER — Other Ambulatory Visit: Payer: Self-pay | Admitting: Physician Assistant

## 2018-07-12 DIAGNOSIS — F419 Anxiety disorder, unspecified: Secondary | ICD-10-CM

## 2018-08-04 ENCOUNTER — Other Ambulatory Visit: Payer: Self-pay | Admitting: Physician Assistant

## 2018-08-15 NOTE — Progress Notes (Signed)
MEDICARE ANNUAL WELLNESS VISIT AND 60monthFOLLOW UP  Assessment:   GStepfaniewas seen today for medicare wellness.  Diagnoses and all orders for this visit:  Encounter for Medicare annual wellness exam  Atherosclerosis of abdominal aorta (HCC) Control B/P and monitor blood pressure  Essential hypertension Hypertension Continue medications: Aspirin 827m Lisinoptil 4m48mbisoprolol 4mg49mmdur 30mg42mllows with Cardiology, Dr HardiEllyn Hackt appointment 10/2018 Monitor blood pressure at home; call if consistently over 130/80 Continue DASH diet.   Reminder to go to the ER if any CP, SOB, nausea, dizziness, severe HA, changes vision/speech, left arm numbness and tingling and jaw pain.  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 Denies any barriers despite questioning. Discussed dietary and exercise modifications. -     glimepiride (AMARYL) 4 MG tablet; TAKE ONE TABLET BY MOUTH TWICE DAILY BEFORE LUNCH AND SUPPER -     gabapentin (NEURONTIN) 100 MG capsule; Take 1 capsule (100 mg total) by mouth every morning. -     Insulin Glargine (LANTUS SOLOSTAR) 100 UNIT/ML Solostar Pen; Inject 16 Units into the skin at bedtime. -     Hemoglobin A1c -     Insulin, random  Liver cirrhosis secondary to NASH (HCC)[facility] -     COMPLETE METABOLIC PANEL WITH GFR  Chronic obstructive pulmonary disease, unspecified COPD type (HCC) Using Symbicort 2 puffs as needed for flares. Discuss proper use of medication  Hyperlipidemia associated with type 2 diabetes mellitus (HCC) Chadwickscussed dietary and exercise modifications.  -     rosuvastatin (CRESTOR) 40 MG tablet; Take 0.5 tablets (20 mg total) by mouth See admin instructions. Take 1/2 tab twice weekly for cholesterol. ONLY ON Monday AND Friday Continue Welchol. -     Lipid  panel  Osteoporosis, unspecified osteoporosis type, unspecified pathological fracture presence Due for DEXA scan Discussed Calcium and Vit D supplementation -     COMPLETE METABOLIC PANEL WITH GFR -     DG Bone Density; Future  Tobacco use disorder Discussed smoking cessation.  Reports she is smoking one pack a day instead of two.  Has been using gum to help.  Reports she has tried chantix, not ready to quit Will continue to assess readiness.  Chronic hyponatremia -     COMPLETE METABOLIC PANEL WITH GFR  Anxiety Anxiety Well managed by current regimen; continue medications Stress management techniques discussed, increase water, good sleep hygiene discussed, increase exercise, and increase veggies.  -     ALPRAZolam (XANAX) 1 MG tablet; Take 1/2 to one tablet by mouth two or three times a day as needed for anxiety. Limit to five days per week.   Vitamin D deficiency -     VITAMIN D 25 Hydroxy (Vit-D Deficiency, Fractures)  Poor compliance with medication Discussed this at length with patient.    Screening for blood or protein in urine -     Microalbumin / creatinine urine ratio -     Urinalysis w microscopic + reflex cultur  Medication management -     glimepiride (AMARYL) 4 MG tablet; TAKE ONE TABLET BY MOUTH TWICE DAILY BEFORE LUNCH AND SUPPER -     gabapentin (NEURONTIN) 100 MG capsule; Take 1 capsule (100 mg total) by mouth every morning. -     rosuvastatin (CRESTOR) 40 MG tablet; Take 0.5 tablets (20  mg total) by mouth See admin instructions. Take 1/2 tab twice weekly for cholesterol. ONLY ON Monday AND Friday -     Insulin Glargine (LANTUS SOLOSTAR) 100 UNIT/ML Solostar Pen; Inject 16 Units into the skin at bedtime. -     CBC with Differential/Platelet -     COMPLETE METABOLIC PANEL WITH GFR -     Magnesium -     Lipid panel -     TSH -     Hemoglobin A1c -     Insulin, random -     VITAMIN D 25 Hydroxy (Vit-D Deficiency, Fractures) -     Vitamin B12 -      Microalbumin / creatinine urine ratio -     Urinalysis w microscopic + reflex cultur -     DG Bone Density; Future -     ALPRAZolam (XANAX) 1 MG tablet; Take 1/2 to one tablet by mouth two or three times a day as needed for anxiety. Limit to five days per week. -     REFLEXIVE URINE CULTURE -     Urine Culture    Call or return with new or worsening symptoms as discussed in appointment.  May contact via office phone [phone] or via [place].   Over 40 minutes of exam, counseling, chart review and critical decision making was performed Future Appointments  Date Time Provider [place]  11/24/2018  2:30 PM Unk Pinto, MD [facility] None  02/23/2019  2:00 PM Vicie Mutters, PA-C [facility] None  08/22/2019  2:00 PM Garnet Sierras, NP [facility] None     Plan:   During the course of the visit the patient was educated and counseled about appropriate screening and preventive services including:    Pneumococcal vaccine   Prevnar 13  Influenza vaccine  Td vaccine  Screening electrocardiogram  Bone densitometry screening  Colorectal cancer screening  Diabetes screening  Glaucoma screening  Nutrition counseling   Advanced directives: requested   Subjective:  Sara Lamb is a 68 y.o. MWF female who presents for Medicare Annual Wellness Visit and 3 month follow up.  She has HTN, HLD, DMII with AA, NASH, Osteoporosis, Tobacco use, Vit D Defciency, COPD, chronic hyponatremia, anxiety and poor medication compliance. Reports she is not checking her blood glucose at all.  We discussed this at length.                                          Patient is treated for HTN circa 1980 & BP has been controlled at home. Today's BP is at goal - 138/88. Three months ago she had non ischemic heart cath by Dr harding with60% RCA lesion not flow-limiting by FFR.  She denies any chest pains, palpitations, dyspnea / orthopnea / PND, dizziness, claudication, or dependent  edema.                                          Also, the patient has history of poorly compliant & poorly controlled T2_DM predating from about 2000 and patient's lack of insight wrt her Diabetes is exceeded only by her lack of compliance and her indifference consistent with her poor control.  She has had no symptoms of reactive hypoglycemia, diabetic polys, paresthesias or visual blurring.  Last A1c was not at goal:  .  Her blood pressure has been controlled at home, today their BP is BP: 136/88 She does not workout. She denies chest pain, shortness of breath, dizziness.  She is on cholesterol medication and denies myalgias. Her cholesterol is not at goal. The cholesterol last visit was:   Lab Results  Component Value Date   CHOL 142 08/16/2018   HDL 45 (L) 08/16/2018   LDLCALC 71 08/16/2018   TRIG 193 (H) 08/16/2018   CHOLHDL 3.2 08/16/2018   She has had diabetes for 20 years. She has not been working on diet and exercise for diabetes, and denies hyperglycemia, hypoglycemia , nausea, paresthesia of the feet, polydipsia, polyuria, visual disturbances, vomiting and weight loss. Last A1C in the office was: 10 and today Lab Results  Component Value Date   HGBA1C 12.2 (H) 08/16/2018   Last GFR:   Lab Results  Component Value Date   GFRNONAA 82 08/16/2018   Lab Results  Component Value Date   GFRAA 96 08/16/2018   Patient is on Vitamin D supplement.   Lab Results  Component Value Date   VD25OH 17 (L) 08/16/2018      Medication Review: Current Outpatient Medications on File Prior to Visit  Medication Sig Dispense Refill  . albuterol (PROVENTIL) (2.5 MG/3ML) 0.083% nebulizer solution Take 3 mLs (2.5 mg total) by nebulization every 6 (six) hours as needed for wheezing or shortness of breath. 75 mL 12  . aspirin EC 81 MG tablet Take 1 tablet (81 mg total) by mouth daily. 90 tablet 3  . aspirin-acetaminophen-caffeine (EXCEDRIN MIGRAINE) 250-250-65 MG tablet Take 1-2 tablets by  mouth as needed for headache or migraine.     . B Complex-C-Folic Acid (STRESS FORMULA PO) Take 1 tablet by mouth 2 (two) times daily as needed (STRESS).    . bisoprolol (ZEBETA) 5 MG tablet Take 0.5 tablets (2.5 mg total) by mouth daily. 15 tablet 6  . budesonide-formoterol (SYMBICORT) 160-4.5 MCG/ACT inhaler Inhale 2 puffs into the lungs 2 (two) times daily. (Patient taking differently: Inhale 2 puffs into the lungs 2 (two) times daily as needed (for flares). ) 1 Inhaler 3  . Cholecalciferol (VITAMIN D3) 1000 units CAPS Take 1,000 Units by mouth daily.     . colesevelam (WELCHOL) 625 MG tablet Take 625 mg by mouth every morning.     . cyclobenzaprine (FLEXERIL) 10 MG tablet Take 1 tablet (10 mg total) by mouth at bedtime as needed for muscle spasms. 90 tablet 0  . diphenhydrAMINE HCl, Sleep, (SLEEP AID) 50 MG CAPS Take 50 mg by mouth at bedtime.     . Diphenhydramine-APAP (PERCOGESIC) 12.5-325 MG TABS Take 1 tablet by mouth 3 (three) times daily.     . famotidine (PEPCID) 20 MG tablet Take 1 tablet (20 mg total) by mouth 2 (two) times daily. (Patient taking differently: Take 20 mg by mouth daily. ) 30 tablet 0  . fluconazole (DIFLUCAN) 150 MG tablet Take 1 tablet (150 mg total) by mouth daily. 1 tablet 3  . Insulin Pen Needle (CAREFINE PEN NEEDLES) 32G X 4 MM MISC Use 1x a day 100 each 3  . nitroGLYCERIN (NITROSTAT) 0.4 MG SL tablet Place 1 tablet (0.4 mg total) under the tongue every 5 (five) minutes as needed for chest pain. 25 tablet 5  . isosorbide mononitrate (IMDUR) 30 MG 24 hr tablet Take 1 tablet (30 mg total) by mouth daily. 30 tablet 6  . lisinopril (PRINIVIL,ZESTRIL) 5 MG tablet Take 1 tablet (5 mg total) by mouth  daily. 90 tablet 3   No current facility-administered medications on file prior to visit.     Allergies  Allergen Reactions  . Actos [Pioglitazone] Other (See Comments)    Patient stated that it elevated her BGL  . Glyburide Other (See Comments)    Patient stated that  it elevated her BGL  . Metformin And Related     "metformin caused mini-strokes" and ELEVATED BS and also GLYBURIDE, ONGLYZA, ACTOS, INVOKANA caused elevated BS  . Onglyza [Saxagliptin] Other (See Comments)    Patient stated that it elevated her BGL  . Insulins Rash  . Janumet [Sitagliptin-Metformin Hcl] Rash and Other (See Comments)    Shakes/ rash  . Penicillins Rash and Other (See Comments)    Current Problems (verified) Patient Active Problem List   Diagnosis Date Noted  . Chronic hyponatremia 03/18/2018  . Coronary artery disease, non-occlusive: RCA 60%, FFR Negative. 02/16/2018  . Precordial chest pain 02/16/2018  . Murmur 02/16/2018  . Cognitive dysfunction 03/27/2017  . Liver cirrhosis secondary to NASH ([place]) 11/23/2016  . Osteoporosis 05/27/2016  . Atherosclerosis of abdominal aorta ([place]) 04/30/2016  . Type 2 diabetes mellitus with atherosclerosis of aorta ([place]) 04/30/2015  . COPD (chronic obstructive pulmonary disease) ([place]) 04/30/2015  . Tobacco use disorder 04/08/2015  . Poor compliance with medication 11/05/2014  . Screening for blood or protein in urine 12/28/2013  . Nonalcoholic hepatosteatosis   . Vitamin D deficiency   . Anxiety   . Essential hypertension   . Hyperlipidemia associated with type 2 diabetes mellitus ([place])     Screening Tests Immunization History  Administered Date(s) Administered  . Hep A / Hep B 01/07/2017, 02/08/2017, 07/09/2017  . Td 06/26/2001  . Tdap 04/30/2015    Preventative care: Last colonoscopy: 2018 Last mammogram: 2017, DUE Last pap smear/pelvic exam: Total Hysterectomy   DEXA:2017, osteoporosis  Prior vaccinations: TD or Tdap: 2016  Influenza: Declined  Pneumococcal: Declined Prevnar13: Declined Shingles/Zostavax: Declined  Names of Other Physician/Practitioners you currently use: 1. [facility] here for primary care 2. Eye exam: Overdue 3. Dentist: Upper and Lower plates, Nov  2019   Patient Care Team: Unk Pinto, MD as PCP - General (Internal Medicine) Kinsinger, Arta Bruce, MD as Consulting Physician (General Surgery) Philemon Kingdom, MD as Consulting Physician (Internal Medicine)  SURGICAL HISTORY She  has a past surgical history that includes Vaginal hysterectomy (age 10); Bilateral salpingoophorectomy (1997); Knee arthroscopy (Right, 11/2014); Cholecystectomy (N/A, 09/30/2016); LEFT HEART CATH AND CORONARY ANGIOGRAPHY (N/A, 04/12/2018); INTRAVASCULAR PRESSURE WIRE/FFR STUDY (N/A, 04/12/2018); and transthoracic echocardiogram (02/2018). FAMILY HISTORY Her family history includes CAD in her maternal aunt, maternal uncle, and sister; CAD (age of onset: 61) in her mother; Cancer in her father; Coronary artery disease (age of onset: 68) in her brother; Diabetes in her brother, father, and mother; Heart attack in her maternal grandmother; Heart attack (age of onset: 72) in her sister; Hyperlipidemia in her brother, sister, and sister; Hypertension in her brother, father, and sister; Stomach cancer in her mother; Stroke in her mother.  SOCIAL HISTORY She  reports that she has been smoking cigarettes. She has a 19.00 pack-year smoking history. She has never used smokeless tobacco. She reports that she does not drink alcohol or use drugs.   MEDICARE WELLNESS OBJECTIVES: Physical activity: Current Exercise Habits: The patient does not participate in regular exercise at present, Exercise limited by: None identified;orthopedic condition(s);cardiac condition(s) Cardiac risk factors: Cardiac Risk Factors include: advanced age (>32mn, >>17women);dyslipidemia;hypertension;smoking/ tobacco  exposure;diabetes mellitus Depression/mood screen:   Depression screen [facility] 2/9 08/16/2018  Decreased Interest 0  Down, Depressed, Hopeless 0  PHQ - 2 Score 0    ADLs:  In your present state of health, do you have any difficulty performing the following activities: 08/16/2018  Hearing?  N  Vision? N  Difficulty concentrating or making decisions? N  Walking or climbing stairs? N  Dressing or bathing? N  Doing errands, shopping? N  Preparing Food and eating ? N  Using the Toilet? N  In the past six months, have you accidently leaked urine? N  Do you have problems with loss of bowel control? N  Managing your Medications? N  Managing your Finances? N  Housekeeping or managing your Housekeeping? N  Some recent data might be hidden     Cognitive Testing  Alert? Yes  Normal Appearance?Yes  Oriented to person? Yes  Place? Yes   Time? Yes  Recall of three objects?  Yes  Can perform simple calculations? Yes  Displays appropriate judgment?Yes  Can read the correct time from a watch face?Yes  EOL planning: Does Patient Have a Medical Advance Directive?: Yes Type of Advance Directive: Healthcare Power of Attorney, Living will Does patient want to make changes to medical advance directive?: No - Patient declined Copy of [place] in Chart?: Yes - validated most recent copy scanned in chart (See row information)  Review of Systems  Constitutional: Negative for chills, diaphoresis, fever, malaise/fatigue and weight loss.     Objective:     Today's Vitals   08/16/18 1407  BP: 136/88  Pulse: 100  Temp: 98.7 F (37.1 C)  SpO2: 99%  Weight: 142 lb 9.6 oz (64.7 kg)  Height: 4' 10"  (1.473 m)   Body mass index is 29.8 kg/m.  General appearance: alert, no distress, WD/WN, female HEENT: normocephalic, sclerae anicteric, TMs pearly, nares patent, no discharge or erythema, pharynx normal Oral cavity: MMM, no lesions Neck: supple, no lymphadenopathy, no thyromegaly, no masses Heart: RRR, normal S1, S2, murmurs Lungs: CTA bilaterally, no wheezes, rhonchi, or rales Abdomen: +bs, soft, non tender, non distended, no masses, no hepatomegaly, no splenomegaly Musculoskeletal: no swelling, no obvious deformity, tender to light palpation to left  lumbar. Extremities: no edema, no cyanosis Pulses: 2+ symmetric, upper and lower extremities, normal cap refill Neurological: alert, oriented x 3, CN2-12 intact, strength normal upper extremities and lower extremities, sensation normal throughout, DTRs 2+ throughout, 3+ patellar reflex, no cerebellar signs, gait normal Psychiatric: normal affect, behavior normal, pleasant   Medicare Attestation I have personally reviewed: The patient's medical and social history Their use of alcohol, tobacco or illicit drugs Their current medications and supplements The patient's functional ability including ADLs,fall risks, home safety risks, cognitive, and hearing and visual impairment Diet and physical activities Evidence for depression or mood disorders  The patient's weight, height, BMI, and visual acuity have been recorded in the chart.  I have made referrals, counseling, and provided education to the patient based on review of the above and I have provided the patient with a written personalized care plan for preventive services.     Garnet Sierras, NP   08/17/2018

## 2018-08-16 ENCOUNTER — Ambulatory Visit (INDEPENDENT_AMBULATORY_CARE_PROVIDER_SITE_OTHER): Payer: PPO | Admitting: Adult Health Nurse Practitioner

## 2018-08-16 ENCOUNTER — Encounter: Payer: Self-pay | Admitting: Physician Assistant

## 2018-08-16 ENCOUNTER — Encounter: Payer: Self-pay | Admitting: Adult Health Nurse Practitioner

## 2018-08-16 VITALS — BP 136/88 | HR 100 | Temp 98.7°F | Ht <= 58 in | Wt 142.6 lb

## 2018-08-16 DIAGNOSIS — E871 Hypo-osmolality and hyponatremia: Secondary | ICD-10-CM | POA: Diagnosis not present

## 2018-08-16 DIAGNOSIS — R6889 Other general symptoms and signs: Secondary | ICD-10-CM | POA: Diagnosis not present

## 2018-08-16 DIAGNOSIS — I700 Atherosclerosis of aorta: Secondary | ICD-10-CM

## 2018-08-16 DIAGNOSIS — E559 Vitamin D deficiency, unspecified: Secondary | ICD-10-CM

## 2018-08-16 DIAGNOSIS — J449 Chronic obstructive pulmonary disease, unspecified: Secondary | ICD-10-CM | POA: Diagnosis not present

## 2018-08-16 DIAGNOSIS — E1151 Type 2 diabetes mellitus with diabetic peripheral angiopathy without gangrene: Secondary | ICD-10-CM | POA: Diagnosis not present

## 2018-08-16 DIAGNOSIS — Z1389 Encounter for screening for other disorder: Secondary | ICD-10-CM | POA: Diagnosis not present

## 2018-08-16 DIAGNOSIS — M810 Age-related osteoporosis without current pathological fracture: Secondary | ICD-10-CM | POA: Diagnosis not present

## 2018-08-16 DIAGNOSIS — Z0001 Encounter for general adult medical examination with abnormal findings: Secondary | ICD-10-CM | POA: Diagnosis not present

## 2018-08-16 DIAGNOSIS — E785 Hyperlipidemia, unspecified: Secondary | ICD-10-CM

## 2018-08-16 DIAGNOSIS — E1169 Type 2 diabetes mellitus with other specified complication: Secondary | ICD-10-CM

## 2018-08-16 DIAGNOSIS — I10 Essential (primary) hypertension: Secondary | ICD-10-CM | POA: Diagnosis not present

## 2018-08-16 DIAGNOSIS — Z79899 Other long term (current) drug therapy: Secondary | ICD-10-CM

## 2018-08-16 DIAGNOSIS — K7581 Nonalcoholic steatohepatitis (NASH): Secondary | ICD-10-CM

## 2018-08-16 DIAGNOSIS — Z91148 Patient's other noncompliance with medication regimen for other reason: Secondary | ICD-10-CM

## 2018-08-16 DIAGNOSIS — E1159 Type 2 diabetes mellitus with other circulatory complications: Secondary | ICD-10-CM

## 2018-08-16 DIAGNOSIS — F419 Anxiety disorder, unspecified: Secondary | ICD-10-CM

## 2018-08-16 DIAGNOSIS — F17200 Nicotine dependence, unspecified, uncomplicated: Secondary | ICD-10-CM

## 2018-08-16 DIAGNOSIS — K7460 Unspecified cirrhosis of liver: Secondary | ICD-10-CM

## 2018-08-16 DIAGNOSIS — Z9114 Patient's other noncompliance with medication regimen: Secondary | ICD-10-CM | POA: Diagnosis not present

## 2018-08-16 DIAGNOSIS — Z0000 Encounter for general adult medical examination without abnormal findings: Secondary | ICD-10-CM

## 2018-08-16 MED ORDER — ROSUVASTATIN CALCIUM 40 MG PO TABS: 20.0000 mg | ORAL_TABLET | ORAL | Status: DC

## 2018-08-16 MED ORDER — GLIMEPIRIDE 4 MG PO TABS: ORAL_TABLET | ORAL | 1 refills | Status: DC

## 2018-08-16 MED ORDER — ALPRAZOLAM 1 MG PO TABS: ORAL_TABLET | ORAL | 0 refills | Status: DC

## 2018-08-16 MED ORDER — GABAPENTIN 100 MG PO CAPS: 100.0000 mg | ORAL_CAPSULE | ORAL | 1 refills | Status: DC

## 2018-08-16 MED ORDER — INSULIN GLARGINE 100 UNIT/ML SOLOSTAR PEN: 16.0000 [IU] | PEN_INJECTOR | Freq: Every day | SUBCUTANEOUS | 11 refills | Status: DC

## 2018-08-16 NOTE — Patient Instructions (Addendum)
Sara Lamb , Thank you for taking time to come for your Medicare Wellness Visit. I appreciate your ongoing commitment to your health goals. Please review the following plan we discussed and let me know if I can assist you in the future.   [facility] for your Mammogram and Bone Density Scan appointment.  You are due for an Diabetic Eye Exam.   This is a list of the screening recommended for you and due dates:  Health Maintenance  Topic Date Due  . Eye exam for diabetics  04/10/1961  . Urine Protein Check  04/30/2017  . Flu Shot  02/24/2018  . Mammogram  05/25/2018  . Complete foot exam   08/16/2018  . Pneumonia vaccines (1 of 2 - PCV13) 08/16/2018*  . Hemoglobin A1C  11/17/2018  . Colon Cancer Screening  01/12/2020  . Tetanus Vaccine  04/29/2025  . DEXA scan (bone density measurement)  Completed  .  Hepatitis C: One time screening is recommended by Center for Disease Control  (CDC) for  adults born from [birth year] through 1965.   Completed  *Topic was postponed. The date shown is not the original due date.       INFORMATION ABOUT CBD OIL  The biggest issues with it, is CBD oil is NOT regulated. A recent test of 18 over the counter CBD oil showed that 20% had TOO much, 60 % did not have the claimed amount, and 20 % had the claimed amount.   I have had some patients where it interacts with their medications as well and there is not a way for me to check the interactions since it is not a controlled substance.   With any other the counter medication OR supplement, if you try it, try to get from good source and if you have ANY abnormal symptoms over the next 3 months or don't see any benefits from it stop it.   The quality and quanity of the CBD oil varies greatly!    We Do NOT Approve of  [facility], [employer]  Our Patients  To Do Home Visits & We Do NOT Approve of [employer] > > > > > > > > > > > > > > > > > > > > > > > > > > > > >  > > > > > > > > > >  Preventive Care for Adults  A healthy lifestyle and preventive care can promote health and wellness. Preventive health guidelines for women include the following key practices.  A routine yearly physical is a good way to check with your health care provider about your health and preventive screening. It is a chance to share any concerns and updates on your health and to receive a thorough exam.  Visit your dentist for a routine exam and preventive care every 6 months. Brush your teeth twice a day and floss once a day. Good oral hygiene prevents tooth decay and gum disease.  The frequency of eye exams is based on your age, health, family medical history, use of contact lenses, and other factors. Follow your health care provider's recommendations for frequency of eye exams.  Eat a healthy diet. Foods like vegetables, fruits, whole grains, low-fat dairy products, and lean protein foods contain the nutrients you need without too many calories. Decrease your intake of foods high in solid fats, added sugars, and salt. Eat the right amount of calories for you. Get information about a proper diet  from your health care provider, if necessary.  Regular physical exercise is one of the most important things you can do for your health. Most adults should get at least 150 minutes of moderate-intensity exercise (any activity that increases your heart rate and causes you to sweat) each week. In addition, most adults need muscle-strengthening exercises on 2 or more days a week.  Maintain a healthy weight. The body mass index (BMI) is a screening tool to identify possible weight problems. It provides an estimate of body fat based on height and weight. Your health care provider can find your BMI and can help you achieve or maintain a healthy weight. For adults 20 years and older:  A BMI below 18.5 is considered underweight.  A BMI of 18.5 to 24.9 is normal.  A BMI of 25 to 29.9 is considered  overweight.  A BMI of 30 and above is considered obese.  Maintain normal blood lipids and cholesterol levels by exercising and minimizing your intake of saturated fat. Eat a balanced diet with plenty of fruit and vegetables. If your lipid or cholesterol levels are high, you are over 50, or you are at high risk for heart disease, you may need your cholesterol levels checked more frequently. Ongoing high lipid and cholesterol levels should be treated with medicines if diet and exercise are not working.  If you smoke, find out from your health care provider how to quit. If you do not use tobacco, do not start.  Lung cancer screening is recommended for adults aged 61-80 years who are at high risk for developing lung cancer because of a history of smoking. A yearly low-dose CT scan of the lungs is recommended for people who have at least a 30-pack-year history of smoking and are a current smoker or have quit within the past 15 years. A pack year of smoking is smoking an average of 1 pack of cigarettes a day for 1 year (for example: 1 pack a day for 30 years or 2 packs a day for 15 years). Yearly screening should continue until the smoker has stopped smoking for at least 15 years. Yearly screening should be stopped for people who develop a health problem that would prevent them from having lung cancer treatment.  Avoid use of street drugs. Do not share needles with anyone. Ask for help if you need support or instructions about stopping the use of drugs.  High blood pressure causes heart disease and increases the risk of stroke.  Ongoing high blood pressure should be treated with medicines if weight loss and exercise do not work.  If you are 32-41 years old, ask your health care provider if you should take aspirin to prevent strokes.  Diabetes screening involves taking a blood sample to check your fasting blood sugar level. This should be done once every 3 years, after age 33, if you are within normal  weight and without risk factors for diabetes. Testing should be considered at a younger age or be carried out more frequently if you are overweight and have at least 1 risk factor for diabetes.  Breast cancer screening is essential preventive care for women. You should practice "breast self-awareness." This means understanding the normal appearance and feel of your breasts and may include breast self-examination. Any changes detected, no matter how small, should be reported to a health care provider. Women in their 14s and 30s should have a clinical breast exam (CBE) by a health care provider as part of a regular health  exam every 1 to 3 years. After age 22, women should have a CBE every year. Starting at age 74, women should consider having a mammogram (breast X-ray test) every year. Women who have a family history of breast cancer should talk to their health care provider about genetic screening. Women at a high risk of breast cancer should talk to their health care providers about having an MRI and a mammogram every year.  Breast cancer gene (BRCA)-related cancer risk assessment is recommended for women who have family members with BRCA-related cancers. BRCA-related cancers include breast, ovarian, tubal, and peritoneal cancers. Having family members with these cancers may be associated with an increased risk for harmful changes (mutations) in the breast cancer genes BRCA1 and BRCA2. Results of the assessment will determine the need for genetic counseling and BRCA1 and BRCA2 testing.  Routine pelvic exams to screen for cancer are no longer recommended for nonpregnant women who are considered low risk for cancer of the pelvic organs (ovaries, uterus, and vagina) and who do not have symptoms. Ask your health care provider if a screening pelvic exam is right for you.  If you have had past treatment for cervical cancer or a condition that could lead to cancer, you need Pap tests and screening for cancer for  at least 20 years after your treatment. If Pap tests have been discontinued, your risk factors (such as having a new sexual partner) need to be reassessed to determine if screening should be resumed. Some women have medical problems that increase the chance of getting cervical cancer. In these cases, your health care provider may recommend more frequent screening and Pap tests.    Colorectal cancer can be detected and often prevented. Most routine colorectal cancer screening begins at the age of 35 years and continues through age 14 years. However, your health care provider may recommend screening at an earlier age if you have risk factors for colon cancer. On a yearly basis, your health care provider may provide home test kits to check for hidden blood in the stool. Use of a small camera at the end of a tube, to directly examine the colon (sigmoidoscopy or colonoscopy), can detect the earliest forms of colorectal cancer. Talk to your health care provider about this at age 75, when routine screening begins.  Direct exam of the colon should be repeated every 5-10 years through age 1 years, unless early forms of pre-cancerous polyps or small growths are found.  Osteoporosis is a disease in which the bones lose minerals and strength with aging. This can result in serious bone fractures or breaks. The risk of osteoporosis can be identified using a bone density scan. Women ages 56 years and over and women at risk for fractures or osteoporosis should discuss screening with their health care providers. Ask your health care provider whether you should take a calcium supplement or vitamin D to reduce the rate of osteoporosis.  Menopause can be associated with physical symptoms and risks. Hormone replacement therapy is available to decrease symptoms and risks. You should talk to your health care provider about whether hormone replacement therapy is right for you.  Use sunscreen. Apply sunscreen liberally and  repeatedly throughout the day. You should seek shade when your shadow is shorter than you. Protect yourself by wearing long sleeves, pants, a wide-brimmed hat, and sunglasses year round, whenever you are outdoors.  Once a month, do a whole body skin exam, using a mirror to look at the skin on your back.  Tell your health care provider of new moles, moles that have irregular borders, moles that are larger than a pencil eraser, or moles that have changed in shape or color.  Stay current with required vaccines (immunizations).  Influenza vaccine. All adults should be immunized every year.  Tetanus, diphtheria, and acellular pertussis (Td, Tdap) vaccine. Pregnant women should receive 1 dose of Tdap vaccine during each pregnancy. The dose should be obtained regardless of the length of time since the last dose. Immunization is preferred during the 27th-36th week of gestation. An adult who has not previously received Tdap or who does not know her vaccine status should receive 1 dose of Tdap. This initial dose should be followed by tetanus and diphtheria toxoids (Td) booster doses every 10 years. Adults with an unknown or incomplete history of completing a 3-dose immunization series with Td-containing vaccines should begin or complete a primary immunization series including a Tdap dose. Adults should receive a Td booster every 10 years.    Zoster vaccine. One dose is recommended for adults aged 59 years or older unless certain conditions are present.    Pneumococcal 13-valent conjugate (PCV13) vaccine. When indicated, a person who is uncertain of her immunization history and has no record of immunization should receive the PCV13 vaccine. An adult aged 2 years or older who has certain medical conditions and has not been previously immunized should receive 1 dose of PCV13 vaccine. This PCV13 should be followed with a dose of pneumococcal polysaccharide (PPSV23) vaccine. The PPSV23 vaccine dose should be  obtained at least 1 or more year(s) after the dose of PCV13 vaccine. An adult aged 33 years or older who has certain medical conditions and previously received 1 or more doses of PPSV23 vaccine should receive 1 dose of PCV13. The PCV13 vaccine dose should be obtained 1 or more years after the last PPSV23 vaccine dose.    Pneumococcal polysaccharide (PPSV23) vaccine. When PCV13 is also indicated, PCV13 should be obtained first. All adults aged 56 years and older should be immunized. An adult younger than age 30 years who has certain medical conditions should be immunized. Any person who resides in a nursing home or long-term care facility should be immunized. An adult smoker should be immunized. People with an immunocompromised condition and certain other conditions should receive both PCV13 and PPSV23 vaccines. People with human immunodeficiency virus (HIV) infection should be immunized as soon as possible after diagnosis. Immunization during chemotherapy or radiation therapy should be avoided. Routine use of PPSV23 vaccine is not recommended for American Indians, [place] Natives, or people younger than 65 years unless there are medical conditions that require PPSV23 vaccine. When indicated, people who have unknown immunization and have no record of immunization should receive PPSV23 vaccine. One-time revaccination 5 years after the first dose of PPSV23 is recommended for people aged 19-64 years who have chronic kidney failure, nephrotic syndrome, asplenia, or immunocompromised conditions. People who received 1-2 doses of PPSV23 before age 35 years should receive another dose of PPSV23 vaccine at age 11 years or later if at least 5 years have passed since the previous dose. Doses of PPSV23 are not needed for people immunized with PPSV23 at or after age 55 years.   Preventive Services / Frequency  Ages 68 years and over  Blood pressure check.  Lipid and cholesterol check.  Lung cancer screening. / Every  year if you are aged 60-80 years and have a 30-pack-year history of smoking and currently smoke or have quit within  the past 15 years. Yearly screening is stopped once you have quit smoking for at least 15 years or develop a health problem that would prevent you from having lung cancer treatment.  Clinical breast exam.** / Every year after age 4 years.   BRCA-related cancer risk assessment.** / For women who have family members with a BRCA-related cancer (breast, ovarian, tubal, or peritoneal cancers).  Mammogram.** / Every year beginning at age 90 years and continuing for as long as you are in good health. Consult with your health care provider.  Pap test.** / Every 3 years starting at age 33 years through age 56 or 48 years with 3 consecutive normal Pap tests. Testing can be stopped between 65 and 70 years with 3 consecutive normal Pap tests and no abnormal Pap or HPV tests in the past 10 years.  Fecal occult blood test (FOBT) of stool. / Every year beginning at age 48 years and continuing until age 39 years. You may not need to do this test if you get a colonoscopy every 10 years.  Flexible sigmoidoscopy or colonoscopy.** / Every 5 years for a flexible sigmoidoscopy or every 10 years for a colonoscopy beginning at age 91 years and continuing until age 76 years.  Hepatitis C blood test.** / For all people born from [birth year] through 1965 and any individual with known risks for hepatitis C.  Osteoporosis screening.** / A one-time screening for women ages 71 years and over and women at risk for fractures or osteoporosis.  Skin self-exam. / Monthly.  Influenza vaccine. / Every year.  Tetanus, diphtheria, and acellular pertussis (Tdap/Td) vaccine.** / 1 dose of Td every 10 years.  Zoster vaccine.** / 1 dose for adults aged 13 years or older.  Pneumococcal 13-valent conjugate (PCV13) vaccine.** / Consult your health care provider.  Pneumococcal polysaccharide (PPSV23) vaccine.** / 1 dose for all  adults aged 21 years and older. Screening for abdominal aortic aneurysm (AAA)  by ultrasound is recommended for people who have history of high blood pressure or who are current or former smokers. ++++++++++++++++++++ Recommend Adult Low Dose Aspirin or  coated  Aspirin 81 mg daily  To reduce risk of Colon Cancer 20 %,  Skin Cancer 26 % ,  Melanoma 46%  and  Pancreatic cancer 60% ++++++++++++++++++++ Vitamin D goal  is between 70-100.  Please make sure that you are taking your Vitamin D as directed.  It is very important as a natural anti-inflammatory  helping hair, skin, and nails, as well as reducing stroke and heart attack risk.  It helps your bones and helps with mood. It also decreases numerous cancer risks so please take it as directed.  Low Vit D is associated with a 200-300% higher risk for CANCER  and 200-300% higher risk for HEART   ATTACK  &  STROKE.   .....................................Marland Kitchen It is also associated with higher death rate at younger ages,  autoimmune diseases like Rheumatoid arthritis, Lupus, Multiple Sclerosis.    Also many other serious conditions, like depression, Alzheimer's Dementia, infertility, muscle aches, fatigue, fibromyalgia - just to name a few. ++++++++++++++++++ Recommend the book "The END of DIETING" by Dr Excell Seltzer  & the book "The END of DIABETES " by Dr Excell Seltzer At [email or web address] - get book & Audio CD's    Being diabetic has a  300% increased risk for heart attack, stroke, cancer, and alzheimer- type vascular dementia. It is very important that you work harder with diet by avoiding all foods that  are white. Avoid white rice (brown & wild rice is OK), white potatoes (sweetpotatoes in moderation is OK), White bread or wheat bread or anything made out of white flour like bagels, donuts, rolls, buns, biscuits, cakes, pastries, cookies, pizza crust, and pasta (made from white flour & egg whites) - vegetarian pasta or spinach or wheat pasta is OK.  Multigrain breads like Arnold's or [employer], or multigrain sandwich thins or flatbreads.  Diet, exercise and weight loss can reverse and cure diabetes in the early stages.  Diet, exercise and weight loss is very important in the control and prevention of complications of diabetes which affects every system in your body, ie. Brain - dementia/stroke, eyes - glaucoma/blindness, heart - heart attack/heart failure, kidneys - dialysis, stomach - gastric paralysis, intestines - malabsorption, nerves - severe painful neuritis, circulation - gangrene & loss of a leg(s), and finally cancer and Alzheimers.    I recommend avoid fried & greasy foods,  sweets/candy, white rice (brown or wild rice or Quinoa is OK), white potatoes (sweet potatoes are OK) - anything made from white flour - bagels, doughnuts, rolls, buns, biscuits,white and wheat breads, pizza crust and traditional pasta made of white flour & egg white(vegetarian pasta or spinach or wheat pasta is OK).  Multi-grain bread is OK - like multi-grain flat bread or sandwich thins. Avoid alcohol in excess. Exercise is also important.    Eat all the vegetables you want - avoid meat, especially red meat and dairy - especially cheese.  Cheese is the most concentrated form of trans-fats which is the worst thing to clog up our arteries. Veggie cheese is OK which can be found in the fresh produce section at [employer] or [employer] or [employer]  +++++++++++++++++++ DASH Eating Plan  DASH stands for "Dietary Approaches to Stop Hypertension."   The DASH eating plan is a healthy eating plan that has been shown to reduce high blood pressure (hypertension). Additional health benefits may include reducing the risk of type 2 diabetes mellitus, heart disease, and stroke. The DASH eating plan may also help with weight loss. WHAT DO I NEED TO KNOW ABOUT THE DASH EATING PLAN? For the DASH eating plan, you will follow these general guidelines:  Choose foods with a  percent daily value for sodium of less than 5% (as listed on the food label).  Use salt-free seasonings or herbs instead of table salt or sea salt.  Check with your health care provider or pharmacist before using salt substitutes.  Eat lower-sodium products, often labeled as "lower sodium" or "no salt added."  Eat fresh foods.  Eat more vegetables, fruits, and low-fat dairy products.  Choose whole grains. Look for the word "whole" as the first word in the ingredient list.  Choose fish   Limit sweets, desserts, sugars, and sugary drinks.  Choose heart-healthy fats.  Eat veggie cheese   Eat more home-cooked food and less restaurant, buffet, and fast food.  Limit fried foods.  Cook foods using methods other than frying.  Limit canned vegetables. If you do use them, rinse them well to decrease the sodium.  When eating at a restaurant, ask that your food be prepared with less salt, or no salt if possible.                      WHAT FOODS CAN I EAT? Read Dr Fara Olden Fuhrman's books on The End of Dieting & The End of Diabetes  Grains Whole grain or whole wheat  bread. Brown rice. Whole grain or whole wheat pasta. Quinoa, bulgur, and whole grain cereals. Low-sodium cereals. Corn or whole wheat flour tortillas. Whole grain cornbread. Whole grain crackers. Low-sodium crackers.  Vegetables Fresh or frozen vegetables (raw, steamed, roasted, or grilled). Low-sodium or reduced-sodium tomato and vegetable juices. Low-sodium or reduced-sodium tomato sauce and paste. Low-sodium or reduced-sodium canned vegetables.   Fruits All fresh, canned (in natural juice), or frozen fruits.  Protein Products  All fish and seafood.  Dried beans, peas, or lentils. Unsalted nuts and seeds. Unsalted canned beans.  Dairy Low-fat dairy products, such as skim or 1% milk, 2% or reduced-fat cheeses, low-fat ricotta or cottage cheese, or plain low-fat yogurt. Low-sodium or reduced-sodium cheeses.  Fats and  Oils Tub margarines without trans fats. Light or reduced-fat mayonnaise and salad dressings (reduced sodium). Avocado. Safflower, olive, or canola oils. Natural peanut or almond butter.  Other Unsalted popcorn and pretzels. The items listed above may not be a complete list of recommended foods or beverages. Contact your dietitian for more options.  +++++++++++++++  WHAT FOODS ARE NOT RECOMMENDED? Grains/ White flour or wheat flour White bread. White pasta. White rice. Refined cornbread. Bagels and croissants. Crackers that contain trans fat.  Vegetables  Creamed or fried vegetables. Vegetables in a . Regular canned vegetables. Regular canned tomato sauce and paste. Regular tomato and vegetable juices.  Fruits Dried fruits. Canned fruit in light or heavy syrup. Fruit juice.  Meat and Other Protein Products Meat in general - RED meat & White meat.  Fatty cuts of meat. Ribs, chicken wings, all processed meats as bacon, sausage, bologna, salami, fatback, hot dogs, bratwurst and packaged luncheon meats.  Dairy Whole or 2% milk, cream, half-and-half, and cream cheese. Whole-fat or sweetened yogurt. Full-fat cheeses or blue cheese. Non-dairy creamers and whipped toppings. Processed cheese, cheese spreads, or cheese curds.  Condiments Onion and garlic salt, seasoned salt, table salt, and sea salt. Canned and packaged gravies. Worcestershire sauce. Tartar sauce. Barbecue sauce. Teriyaki sauce. Soy sauce, including reduced sodium. Steak sauce. Fish sauce. Oyster sauce. Cocktail sauce. Horseradish. Ketchup and mustard. Meat flavorings and tenderizers. Bouillon cubes. Hot sauce. Tabasco sauce. Marinades. Taco seasonings. Relishes.  Fats and Oils Butter, stick margarine, lard, shortening and bacon fat. Coconut, palm kernel, or palm oils. Regular salad dressings.  Pickles and olives. Salted popcorn and pretzels.  The items listed above may not be a complete list of foods and beverages to  avoid.

## 2018-08-17 ENCOUNTER — Encounter: Payer: Self-pay | Admitting: Adult Health Nurse Practitioner

## 2018-08-18 LAB — COMPLETE METABOLIC PANEL WITHOUT GFR
Chloride: 93 mmol/L — ABNORMAL LOW (ref 98–110)
Total Bilirubin: 0.4 mg/dL (ref 0.2–1.2)

## 2018-08-18 LAB — CBC WITH DIFFERENTIAL/PLATELET
Absolute Monocytes: 499 cells/uL (ref 200–950)
Basophils Absolute: 112 cells/uL (ref 0–200)
Basophils Relative: 1.3 %
Eosinophils Absolute: 516 cells/uL — ABNORMAL HIGH (ref 15–500)
Eosinophils Relative: 6 %
HCT: 44.7 % (ref 35.0–45.0)
Hemoglobin: 15.2 g/dL (ref 11.7–15.5)
Lymphs Abs: 2528 {cells}/uL (ref 850–3900)
MCH: 29.7 pg (ref 27.0–33.0)
MCHC: 34 g/dL (ref 32.0–36.0)
MCV: 87.3 fL (ref 80.0–100.0)
MPV: 10.5 fL (ref 7.5–12.5)
Monocytes Relative: 5.8 %
Neutro Abs: 4945 cells/uL (ref 1500–7800)
Neutrophils Relative %: 57.5 %
Platelets: 250 10*3/uL (ref 140–400)
RBC: 5.12 Million/uL — ABNORMAL HIGH (ref 3.80–5.10)
RDW: 11.8 % (ref 11.0–15.0)
Total Lymphocyte: 29.4 %
WBC: 8.6 10*3/uL (ref 3.8–10.8)

## 2018-08-18 LAB — TSH: TSH: 0.73 mIU/L (ref 0.40–4.50)

## 2018-08-18 LAB — COMPLETE METABOLIC PANEL WITH GFR
AG Ratio: 1.2 (calc) (ref 1.0–2.5)
ALT: 22 U/L (ref 6–29)
AST: 20 U/L (ref 10–35)
Albumin: 4 g/dL (ref 3.6–5.1)
Alkaline phosphatase (APISO): 127 U/L (ref 33–130)
BUN: 12 mg/dL (ref 7–25)
CO2: 28 mmol/L (ref 20–32)
Calcium: 9.8 mg/dL (ref 8.6–10.4)
Creat: 0.75 mg/dL (ref 0.50–0.99)
GFR, Est African American: 96 mL/min/{1.73_m2} (ref >=60)
GFR, Est Non African American: 82 mL/min/{1.73_m2} (ref >=60)
Globulin: 3.3 g/dL (calc) (ref 1.9–3.7)
Glucose, Bld: 233 mg/dL — ABNORMAL HIGH (ref 65–99)
Potassium: 4.9 mmol/L (ref 3.5–5.3)
Sodium: 130 mmol/L — ABNORMAL LOW (ref 135–146)
Total Protein: 7.3 g/dL (ref 6.1–8.1)

## 2018-08-18 LAB — VITAMIN B12: Vitamin B-12: 743 pg/mL (ref 200–1100)

## 2018-08-18 LAB — URINALYSIS W MICROSCOPIC + REFLEX CULTURE
Bilirubin Urine: NEGATIVE
Hgb urine dipstick: NEGATIVE
Hyaline Cast: NONE SEEN /LPF
Ketones, ur: NEGATIVE
Nitrites, Initial: NEGATIVE
Specific Gravity, Urine: 1.01 (ref 1.001–1.03)
WBC, UA: >=60 /HPF — AB (ref 0–5)
pH: <=5 (ref 5.0–8.0)

## 2018-08-18 LAB — URINE CULTURE
MICRO NUMBER:: 88578
Result:: NO GROWTH
SPECIMEN QUALITY:: ADEQUATE

## 2018-08-18 LAB — HEMOGLOBIN A1C
Hgb A1c MFr Bld: 12.2 % of total Hgb — ABNORMAL HIGH (ref <5.7)
Mean Plasma Glucose: 303 (calc)
eAG (mmol/L): 16.8 (calc)

## 2018-08-18 LAB — LIPID PANEL
Cholesterol: 142 mg/dL (ref <200)
HDL: 45 mg/dL — ABNORMAL LOW (ref >50)
LDL Cholesterol (Calc): 71 mg/dL (calc)
Non-HDL Cholesterol (Calc): 97 mg/dL (calc) (ref <130)
Total CHOL/HDL Ratio: 3.2 (calc) (ref <5.0)
Triglycerides: 193 mg/dL — ABNORMAL HIGH (ref <150)

## 2018-08-18 LAB — INSULIN, RANDOM: Insulin: 33.9 u[IU]/mL — ABNORMAL HIGH (ref 2.0–19.6)

## 2018-08-18 LAB — VITAMIN D 25 HYDROXY (VIT D DEFICIENCY, FRACTURES): Vit D, 25-Hydroxy: 17 ng/mL — ABNORMAL LOW (ref 30–100)

## 2018-08-18 LAB — MICROALBUMIN / CREATININE URINE RATIO
Creatinine, Urine: 16 mg/dL — ABNORMAL LOW (ref 20–275)
Microalb Creat Ratio: 1006 mcg/mg creat — ABNORMAL HIGH (ref <30)
Microalb, Ur: 16.1 mg/dL

## 2018-08-18 LAB — MAGNESIUM: Magnesium: 1.8 mg/dL (ref 1.5–2.5)

## 2018-08-18 LAB — CULTURE INDICATED

## 2018-08-19 ENCOUNTER — Other Ambulatory Visit: Payer: Self-pay

## 2018-08-19 DIAGNOSIS — Z79899 Other long term (current) drug therapy: Secondary | ICD-10-CM

## 2018-08-19 DIAGNOSIS — E785 Hyperlipidemia, unspecified: Principal | ICD-10-CM

## 2018-08-19 DIAGNOSIS — E1169 Type 2 diabetes mellitus with other specified complication: Secondary | ICD-10-CM

## 2018-08-19 MED ORDER — ROSUVASTATIN CALCIUM 40 MG PO TABS: 20.0000 mg | ORAL_TABLET | ORAL | 0 refills | Status: DC

## 2018-09-27 ENCOUNTER — Other Ambulatory Visit: Payer: Self-pay | Admitting: Physician Assistant

## 2018-09-28 ENCOUNTER — Other Ambulatory Visit: Payer: Self-pay | Admitting: Internal Medicine

## 2018-09-28 ENCOUNTER — Other Ambulatory Visit: Payer: Self-pay | Admitting: Cardiology

## 2018-09-28 DIAGNOSIS — F419 Anxiety disorder, unspecified: Secondary | ICD-10-CM

## 2018-09-28 DIAGNOSIS — Z79899 Other long term (current) drug therapy: Secondary | ICD-10-CM

## 2018-09-28 MED ORDER — ALPRAZOLAM 1 MG PO TABS: ORAL_TABLET | ORAL | 0 refills | Status: DC

## 2018-09-30 ENCOUNTER — Other Ambulatory Visit: Payer: Self-pay | Admitting: Adult Health Nurse Practitioner

## 2018-09-30 DIAGNOSIS — F419 Anxiety disorder, unspecified: Secondary | ICD-10-CM

## 2018-09-30 DIAGNOSIS — Z79899 Other long term (current) drug therapy: Secondary | ICD-10-CM

## 2018-10-20 ENCOUNTER — Other Ambulatory Visit: Payer: Self-pay

## 2018-10-20 ENCOUNTER — Other Ambulatory Visit: Payer: Self-pay | Admitting: Cardiology

## 2018-10-20 MED ORDER — ISOSORBIDE MONONITRATE ER 30 MG PO TB24: 30.0000 mg | ORAL_TABLET | Freq: Every day | ORAL | 6 refills | Status: DC

## 2018-10-20 NOTE — Telephone Encounter (Signed)
°*  STAT* If patient is at the pharmacy, call can be transferred to refill team.   1. Which medications need to be refilled? (please list name of each medication and dose if known)  isosorbide mononitrate (IMDUR) 30 MG 24 hr tablet bisoprolol (ZEBETA) 5 MG tablet  2. Which pharmacy/location (including street and city if local pharmacy) is medication to be sent to? [place], [place]  3. Do they need a 30 day or 90 day supply? [place]

## 2018-10-31 ENCOUNTER — Ambulatory Visit: Payer: PPO | Admitting: Cardiology

## 2018-10-31 ENCOUNTER — Other Ambulatory Visit: Payer: Self-pay | Admitting: Internal Medicine

## 2018-10-31 DIAGNOSIS — F419 Anxiety disorder, unspecified: Secondary | ICD-10-CM

## 2018-10-31 DIAGNOSIS — Z79899 Other long term (current) drug therapy: Secondary | ICD-10-CM

## 2018-11-24 ENCOUNTER — Ambulatory Visit (INDEPENDENT_AMBULATORY_CARE_PROVIDER_SITE_OTHER): Payer: PPO | Admitting: Internal Medicine

## 2018-11-24 ENCOUNTER — Ambulatory Visit: Payer: Self-pay | Admitting: Internal Medicine

## 2018-11-24 ENCOUNTER — Encounter: Payer: Self-pay | Admitting: Internal Medicine

## 2018-11-24 ENCOUNTER — Other Ambulatory Visit: Payer: Self-pay

## 2018-11-24 VITALS — BP 108/62 | HR 76 | Temp 97.1°F | Resp 16 | Ht <= 58 in | Wt 139.0 lb

## 2018-11-24 DIAGNOSIS — E559 Vitamin D deficiency, unspecified: Secondary | ICD-10-CM

## 2018-11-24 DIAGNOSIS — E782 Mixed hyperlipidemia: Secondary | ICD-10-CM

## 2018-11-24 DIAGNOSIS — I10 Essential (primary) hypertension: Secondary | ICD-10-CM

## 2018-11-24 DIAGNOSIS — M940 Chondrocostal junction syndrome [Tietze]: Secondary | ICD-10-CM | POA: Diagnosis not present

## 2018-11-24 DIAGNOSIS — E119 Type 2 diabetes mellitus without complications: Secondary | ICD-10-CM | POA: Insufficient documentation

## 2018-11-24 DIAGNOSIS — Z79899 Other long term (current) drug therapy: Secondary | ICD-10-CM

## 2018-11-24 DIAGNOSIS — E1165 Type 2 diabetes mellitus with hyperglycemia: Secondary | ICD-10-CM | POA: Diagnosis not present

## 2018-11-24 DIAGNOSIS — K760 Fatty (change of) liver, not elsewhere classified: Secondary | ICD-10-CM | POA: Diagnosis not present

## 2018-11-24 DIAGNOSIS — E1140 Type 2 diabetes mellitus with diabetic neuropathy, unspecified: Secondary | ICD-10-CM

## 2018-11-24 DIAGNOSIS — E1159 Type 2 diabetes mellitus with other circulatory complications: Secondary | ICD-10-CM | POA: Insufficient documentation

## 2018-11-24 MED ORDER — GABAPENTIN 600 MG PO TABS: ORAL_TABLET | ORAL | 1 refills | Status: DC

## 2018-11-24 MED ORDER — METFORMIN HCL ER 500 MG PO TB24: ORAL_TABLET | ORAL | 3 refills | Status: DC

## 2018-11-24 NOTE — Patient Instructions (Signed)

## 2018-11-24 NOTE — Progress Notes (Signed)
History of Present Illness:      This very nice 68 y.o. MWF presents for 3 month follow up with HTN, HLD, T2_DM and Vitamin D Deficiency. Patient is followed by Dr Havery Moros for Fatty Liver/NASH.      Patient is treated for HTN (1980) & BP has been controlled at home. Today's BP is  108/62 and standing BP rechecked at 93/62 / P 73. Patient had recent Heart Cath finding No Significant CAD by Dr Ellyn Hack.  Patient does admit left sided Chest wall pains and tenderness and  has had no complaints of any cardiac type chest pain, palpitations, dyspnea / orthopnea / PND, dizziness, claudication, or dependent edema.      Hyperlipidemia is controlled with diet & meds. Patient denies myalgias or other med SE's. Last Lipids were at goal albeit sl elevated Trig's: Lab Results  Component Value Date   CHOL 173 11/24/2018   HDL 45 (L) 11/24/2018   LDLCALC 95 11/24/2018   TRIG 252 (H) 11/24/2018   CHOLHDL 3.8 11/24/2018       Also, the patient has history of chronically poorly controlled T2_DM (2000) attributed to poor compliance and limited insight and comprehension.  She stopped Metformin years ago as she was convinced that it caused her to have a stroke. She has sporadically been on various insulins in the past, but now has been off of her insulin for 1 month as she's convinced that the insulin abdominal and flank injections were causing her to develop large sore over her upper back & posterior shoulders.    She has had no symptoms of reactive hypoglycemia, diabetic polys, paresthesias or visual blurring.  Her A1c's  have been running in the 10's.  Current A1c is even worse: Lab Results  Component Value Date   HGBA1C 13.3 (H) 11/24/2018      Further, the patient also has history of Vitamin D Deficiency and she refuses to supplements vitamin D. Last vitamin D was not at goal (70-100): Lab Results  Component Value Date   VD25OH 16 (L) 11/24/2018   Current Outpatient Medications on File Prior to Visit   Medication Sig  . albuterol (PROVENTIL) (2.5 MG/3ML) 0.083% nebulizer solution Take 3 mLs (2.5 mg total) by nebulization every 6 (six) hours as needed for wheezing or shortness of breath.  . ALPRAZolam (XANAX) 1 MG tablet Take 1/2 to 1 tablet by MOUTH two OR three times daily AS NEEDED FOR anxiety. Limit TO five DAYS PER WEEK.]  . aspirin EC 81 MG tablet Take 1 tablet (81 mg total) by mouth daily.  Marland Kitchen aspirin-acetaminophen-caffeine (EXCEDRIN MIGRAINE) 250-250-65 MG tablet Take 1-2 tablets by mouth as needed for headache or migraine.   . bisoprolol (ZEBETA) 5 MG tablet TAKE 1/2 TABLET BY MOUTH ONCE DAILY  . budesonide-formoterol (SYMBICORT) 160-4.5 MCG/ACT inhaler Inhale 2 puffs into the lungs 2 (two) times daily. (Patient taking differently: Inhale 2 puffs into the lungs 2 (two) times daily as needed (for flares). )  . Cholecalciferol (VITAMIN D3) 1000 units CAPS Take 1,000 Units by mouth daily.   . colesevelam (WELCHOL) 625 MG tablet Take 625 mg by mouth every morning.   . cyclobenzaprine (FLEXERIL) 10 MG tablet Take 1 tablet (10 mg total) by mouth at bedtime as needed for muscle spasms.  . diphenhydrAMINE HCl, Sleep, (SLEEP AID) 50 MG CAPS Take 50 mg by mouth at bedtime.   . Diphenhydramine-APAP (PERCOGESIC) 12.5-325 MG TABS Take 1 tablet by mouth 3 (three) times daily.   Marland Kitchen  famotidine (PEPCID) 20 MG tablet Take 1 tablet (20 mg total) by mouth 2 (two) times daily. (Patient taking differently: Take 20 mg by mouth daily. )  . fluconazole (DIFLUCAN) 150 MG tablet Take 1 tablet (150 mg total) by mouth daily.  Marland Kitchen glimepiride (AMARYL) 4 MG tablet TAKE ONE TABLET BY MOUTH TWICE DAILY BEFORE LUNCH AND SUPPER  . Insulin Glargine (LANTUS SOLOSTAR) 100 UNIT/ML Solostar Pen Inject 16 Units into the skin at bedtime.  . Insulin Pen Needle (CAREFINE PEN NEEDLES) 32G X 4 MM MISC Use 1x a day  . isosorbide mononitrate (IMDUR) 30 MG 24 hr tablet Take 1 tablet (30 mg total) by mouth daily.  . nitroGLYCERIN  (NITROSTAT) 0.4 MG SL tablet Place 1 tablet (0.4 mg total) under the tongue every 5 (five) minutes as needed for chest pain.  . rosuvastatin (CRESTOR) 40 MG tablet Take 0.5 tablets (20 mg total) by mouth See admin instructions. Take 1/2 tab twice weekly for cholesterol. ONLY ON Monday AND Friday  . B Complex-C-Folic Acid (STRESS FORMULA PO) Take 1 tablet by mouth 2 (two) times daily as needed (STRESS).  Marland Kitchen lisinopril (PRINIVIL,ZESTRIL) 5 MG tablet Take 1 tablet (5 mg total) by mouth daily.   No current facility-administered medications on file prior to visit.    Allergies  Allergen Reactions  . Actos [Pioglitazone] Other (See Comments)    Patient stated that it elevated her BGL  . Glyburide Other (See Comments)    Patient stated that it elevated her BGL  . Metformin And Related     "metformin caused mini-strokes" and ELEVATED BS and also GLYBURIDE, ONGLYZA, ACTOS, INVOKANA caused elevated BS  . Onglyza [Saxagliptin] Other (See Comments)    Patient stated that it elevated her BGL  . Insulins Rash  . Janumet [Sitagliptin-Metformin Hcl] Rash and Other (See Comments)    Shakes/ rash  . Penicillins Rash and Other (See Comments)   PMHx:   Past Medical History:  Diagnosis Date  . Anxiety   . Anxiety disorder   . Cirrhosis ([place])   . COPD (chronic obstructive pulmonary disease) ([place])    followed by pcp--  last exacerbation 11/ 2017  . Coronary artery calcification seen on CT scan   . Depression    Patient denies  . Gastritis   . History of rib fracture    2004  . 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 Hyperlipidemia   . Hypertension   . OA (osteoarthritis)    back, hips, knees, feet  . Osteoporosis   . Polyp of colon, hyperplastic   . Pulmonary nodules    per CT chest 08-28-2016  --  bilateral upper lobe nodules  . Stroke ([place])   . Tubular adenoma of colon   . Type 2 diabetes mellitus ([place])    followed by pcp--  last A1c 9.1 on 08-01-2016  . UTI (urinary tract infection)   . Wears dentures    Immunization History  Administered Date(s) Administered  . Hep A / Hep B 01/07/2017, 02/08/2017, 07/09/2017  . Td 06/26/2001  . Tdap 04/30/2015   Past Surgical History:  Procedure Laterality Date  . BILATERAL SALPINGOOPHORECTOMY  1997   via Laparoscopy  . CHOLECYSTECTOMY N/A 09/30/2016   Procedure: LAPAROSCOPIC CHOLECYSTECTOMY;  Surgeon: Arta Bruce Kinsinger, MD;  Location[facility];  Service: General;  Laterality: N/A;  . INTRAVASCULAR PRESSURE WIRE/FFR STUDY N/A 04/12/2018   Procedure: INTRAVASCULAR PRESSURE WIRE/FFR STUDY;  Surgeon: Leonie Man, MD;  Location: [place] [facility];  Service: Cardiovascular;  Laterality: N/A;  . KNEE ARTHROSCOPY Right 11/2014  . LEFT HEART CATH AND CORONARY ANGIOGRAPHY N/A 04/12/2018   Procedure: LEFT HEART CATH AND CORONARY ANGIOGRAPHY;  Surgeon: Leonie Man, MD;  Location: [place] [facility];  Service: Cardiovascular;  Laterality: N/A;  . TRANSTHORACIC ECHOCARDIOGRAM  02/2018    Normal LV function.  EF 60 to 65%.  No RWMMA.  GRII DD.  Moderate LA dilation.  Marland Kitchen VAGINAL HYSTERECTOMY  age 34   FHx:    Reviewed / unchanged  SHx:    Reviewed / unchanged   Systems Review:  Constitutional: Denies fever, chills, wt changes, headaches, insomnia, fatigue, night sweats, change in appetite. Eyes: Denies redness, blurred vision, diplopia, discharge, itchy, watery eyes.  ENT: Denies discharge, congestion, post nasal drip, epistaxis, sore throat, earache, hearing loss, dental pain, tinnitus, vertigo, sinus pain, snoring.  CV: Denies chest pain, palpitations, irregular heartbeat, syncope, dyspnea, diaphoresis, orthopnea, PND, claudication or edema. Respiratory: denies cough, dyspnea, DOE, pleurisy, hoarseness, laryngitis,  wheezing.  Gastrointestinal: Denies dysphagia, odynophagia, heartburn, reflux, water brash, abdominal pain or cramps, nausea, vomiting, bloating, diarrhea, constipation, hematemesis, melena, hematochezia  or hemorrhoids. Genitourinary: Denies dysuria, frequency, urgency, nocturia, hesitancy, discharge, hematuria or flank pain. Musculoskeletal: Denies arthralgias, myalgias, stiffness, jt. swelling, pain, limping or strain/sprain.  Skin: Denies pruritus, rash, hives, warts, acne, eczema or change in skin lesion(s). Neuro: No weakness, tremor, incoordination, spasms, paresthesia or pain. Psychiatric: Denies confusion, memory loss or sensory loss. Endo: Denies change in weight, skin or hair change.  Heme/Lymph: No excessive bleeding, bruising or enlarged lymph nodes.  Physical Exam  Sitting BP 108/62 /  Standing BP dropped to 92/62 P 76   T 97.1 F    R 16   Ht 4' 10"  Wt 139 lb   BMI 29.05    Appears  Over nourished   and in no distress.  Eyes: PERRLA, EOMs, conjunctiva no swelling or erythema. Sinuses: No frontal/maxillary tenderness ENT/Mouth: EAC's clear, TM's nl w/o erythema, bulging. Nares clear w/o erythema, swelling, exudates. Oropharynx clear without erythema or exudates. Oral hygiene is good. Tongue normal, non obstructing. Hearing intact.  Neck: Supple. Thyroid not palpable. Car 2+/2+ without bruits, nodes or JVD. Chest: Exquisitely tender over Left lower anterior ribs. Respirations nl with BS clear & equal w/o rales, rhonchi, wheezing or stridor.  Cor: Heart sounds normal w/ regular rate and rhythm without sig. murmurs, gallops, clicks or rubs. Peripheral pulses normal and equal  without edema.  Abdomen: Soft & bowel sounds normal. Non-tender w/o guarding, rebound, hernias or masses. Musculoskeletal: Full ROM all peripheral extremities, joint stability, 5/5 strength and normal gait.  Skin:  Dry scabs over thickened or hypertrophic broad scars of her posterior shoulders.  Neuro:  Cranial nerves intact, reflexes equal bilaterally. Sensory-motor testing grossly intact. Tendon reflexes grossly intact.  Pysch: Alert & oriented x 3.  Very poor Insight and judgement.  Assessment and Plan:  1. Essential hypertension  - Advised patient to stop her Isosorbide and monitor standing BP   - TSH - Magnesium - COMPLETE METABOLIC PANEL WITH GFR - CBC with Differential/Platelet  2. Hyperlipidemia, mixed  - TSH - Lipid panel  3. T2_NIDDM, poorly controlled  - Insulin, random - Hemoglobin A1c  -  Strongly encouraged her to restart Metformin & new Rx sent in   - metFORMIN (GLUCOPHAGE-XR) 500 MG 24 hr tablet; Take 1 to 2 tablets 2 x/ day with meals for Diabetes  Dispense: 360 tablet; Refill: 3  - Strongly advised monitoring blood sugars and call office if running over 150.  4. Vitamin D deficiency  - VITAMIN D 25 Hydroxyl  5. Costochondritis  - gabapentin (NEURONTIN) 600 MG tablet; Take 1/2 to 1 tablet 2 to 3 x /Daily for Diabetic   Neuropathy Pain & Costochondritis Chest Wall Pain  Dispense: 270 tablet; Refill: 1  6. Chronic painful diabetic neuropathy (HCC)  - gabapentin (NEURONTIN) 600 MG tablet; Take 1/2 to 1 tablet 2 to 3 x /Daily for Diabetic   Neuropathy Pain & Costochondritis Chest Wall Pain  Dispense: 270 tablet; Refill: 1  7. Nonalcoholic hepatosteatosis  - COMPLETE METABOLIC PANEL WITH GFR  8. Medication management  - VITAMIN D 25 Hydroxy  - Insulin, random - Hemoglobin A1c - TSH - Lipid panel - Magnesium - COMPLETE METABOLIC PANEL WITH GFR - CBC with Differential/Platelet        Discussed  regular exercise, BP monitoring, weight control and discussed med and SE's. Recommended labs to assess and monitor clinical status with further disposition pending results of labs.  I discussed the assessment and treatment plan with the patient.  The patient was advised to schedule an appointment with her husband's Dermatologist for consideration of  Biopsy the skin lesions of her upper back & posterior shoulders. The patient was provided an opportunity to ask questions and all were answered. The patient agreed with the plan and demonstrated an understanding of the instructions.  I provided  38 minutes of exam, counseling, chart review and  complex critical decision making.   Kirtland Bouchard, MD

## 2018-11-25 ENCOUNTER — Encounter: Payer: Self-pay | Admitting: Internal Medicine

## 2018-11-25 LAB — COMPLETE METABOLIC PANEL WITH GFR
AG Ratio: 1.2 (calc) (ref 1.0–2.5)
ALT: 31 U/L — ABNORMAL HIGH (ref 6–29)
AST: 27 U/L (ref 10–35)
Albumin: 3.9 g/dL (ref 3.6–5.1)
Alkaline phosphatase (APISO): 133 U/L (ref 37–153)
BUN: 13 mg/dL (ref 7–25)
CO2: 30 mmol/L (ref 20–32)
Calcium: 9.5 mg/dL (ref 8.6–10.4)
Chloride: 92 mmol/L — ABNORMAL LOW (ref 98–110)
Creat: 0.79 mg/dL (ref 0.50–0.99)
GFR, Est African American: 90 mL/min/{1.73_m2} (ref >=60)
GFR, Est Non African American: 77 mL/min/{1.73_m2} (ref >=60)
Globulin: 3.3 g/dL (calc) (ref 1.9–3.7)
Glucose, Bld: 339 mg/dL — ABNORMAL HIGH (ref 65–99)
Potassium: 4.8 mmol/L (ref 3.5–5.3)
Sodium: 129 mmol/L — ABNORMAL LOW (ref 135–146)
Total Bilirubin: 0.3 mg/dL (ref 0.2–1.2)
Total Protein: 7.2 g/dL (ref 6.1–8.1)

## 2018-11-25 LAB — CBC WITH DIFFERENTIAL/PLATELET
Absolute Monocytes: 528 cells/uL (ref 200–950)
Basophils Absolute: 112 cells/uL (ref 0–200)
Basophils Relative: 1.4 %
Eosinophils Absolute: 448 cells/uL (ref 15–500)
Eosinophils Relative: 5.6 %
HCT: 44.5 % (ref 35.0–45.0)
Hemoglobin: 14.7 g/dL (ref 11.7–15.5)
Lymphs Abs: 2232 cells/uL (ref 850–3900)
MCH: 29.1 pg (ref 27.0–33.0)
MCHC: 33 g/dL (ref 32.0–36.0)
MCV: 87.9 fL (ref 80.0–100.0)
MPV: 10.6 fL (ref 7.5–12.5)
Monocytes Relative: 6.6 %
Neutro Abs: 4680 cells/uL (ref 1500–7800)
Neutrophils Relative %: 58.5 %
Platelets: 254 10*3/uL (ref 140–400)
RBC: 5.06 10*6/uL (ref 3.80–5.10)
RDW: 12 % (ref 11.0–15.0)
Total Lymphocyte: 27.9 %
WBC: 8 10*3/uL (ref 3.8–10.8)

## 2018-11-25 LAB — LIPID PANEL
Cholesterol: 173 mg/dL (ref <200)
HDL: 45 mg/dL — ABNORMAL LOW (ref >=50)
LDL Cholesterol (Calc): 95 mg/dL (calc)
Non-HDL Cholesterol (Calc): 128 mg/dL (calc) (ref <130)
Total CHOL/HDL Ratio: 3.8 (calc) (ref <5.0)
Triglycerides: 252 mg/dL — ABNORMAL HIGH (ref <150)

## 2018-11-25 LAB — VITAMIN D 25 HYDROXY (VIT D DEFICIENCY, FRACTURES): Vit D, 25-Hydroxy: 16 ng/mL — ABNORMAL LOW (ref 30–100)

## 2018-11-25 LAB — HEMOGLOBIN A1C
Hgb A1c MFr Bld: 13.3 % of total Hgb — ABNORMAL HIGH (ref <5.7)
Mean Plasma Glucose: 335 (calc)
eAG (mmol/L): 18.6 (calc)

## 2018-11-25 LAB — INSULIN, RANDOM: Insulin: 32.7 u[IU]/mL — ABNORMAL HIGH

## 2018-11-25 LAB — TSH: TSH: 0.8 mIU/L (ref 0.40–4.50)

## 2018-11-25 LAB — MAGNESIUM: Magnesium: 1.8 mg/dL (ref 1.5–2.5)

## 2018-11-28 ENCOUNTER — Telehealth: Payer: Self-pay | Admitting: *Deleted

## 2018-11-28 NOTE — Telephone Encounter (Signed)
Patient called and reported a blood sugar of 400 prior to eating lunch.  Per Dr Melford Aase, she should continue her Glimepiride 4 mg 2 times a day and be sure to take Metformin XR 500 mg 2 tablets twice a day.  She was advised to check her Blood sugar before lunch and supper, since she states she does not eat breakfast. She was advised to increase her fluid intake due to her BP being low at 88/64 and can increase her salt intake, for today.

## 2018-11-28 NOTE — Telephone Encounter (Signed)
Pt aware of lab results 

## 2018-12-02 ENCOUNTER — Other Ambulatory Visit: Payer: Self-pay | Admitting: Adult Health Nurse Practitioner

## 2018-12-02 DIAGNOSIS — R920 Mammographic microcalcification found on diagnostic imaging of breast: Secondary | ICD-10-CM

## 2018-12-15 ENCOUNTER — Other Ambulatory Visit: Payer: Self-pay | Admitting: Physician Assistant

## 2018-12-15 DIAGNOSIS — F419 Anxiety disorder, unspecified: Secondary | ICD-10-CM

## 2018-12-15 DIAGNOSIS — Z79899 Other long term (current) drug therapy: Secondary | ICD-10-CM

## 2018-12-26 ENCOUNTER — Other Ambulatory Visit: Payer: Self-pay | Admitting: Cardiology

## 2019-01-03 IMAGING — US US ABDOMEN COMPLETE
1 series · 14 of 25 positions shown · non-contrast
Comparison: 01/07/2017

CLINICAL DATA: Upper abdominal pain, history of cirrhosis

EXAM:
ABDOMEN ULTRASOUND COMPLETE

[Series 1: us abdomen complete · 0.25mm/px · 14 of 85 slices shown]
[im 1/85]
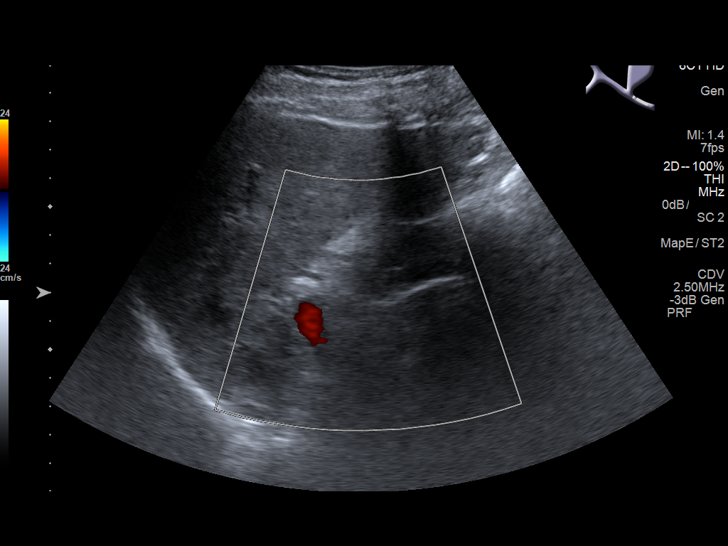
[im 8/85]
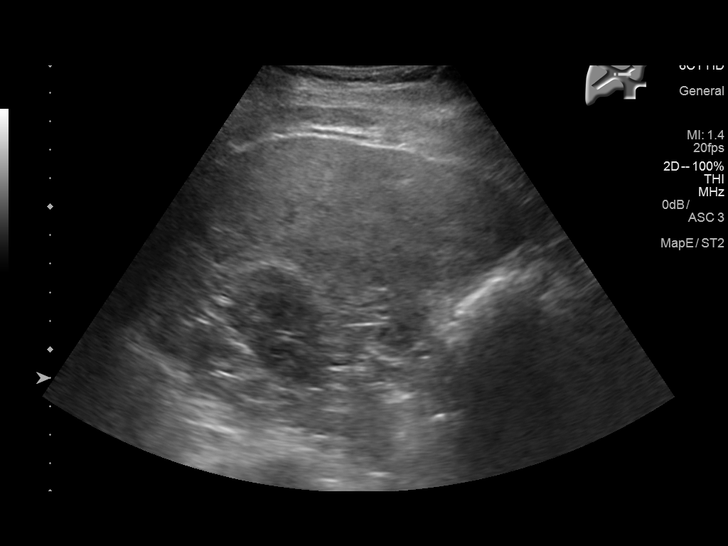
[im 15/85]
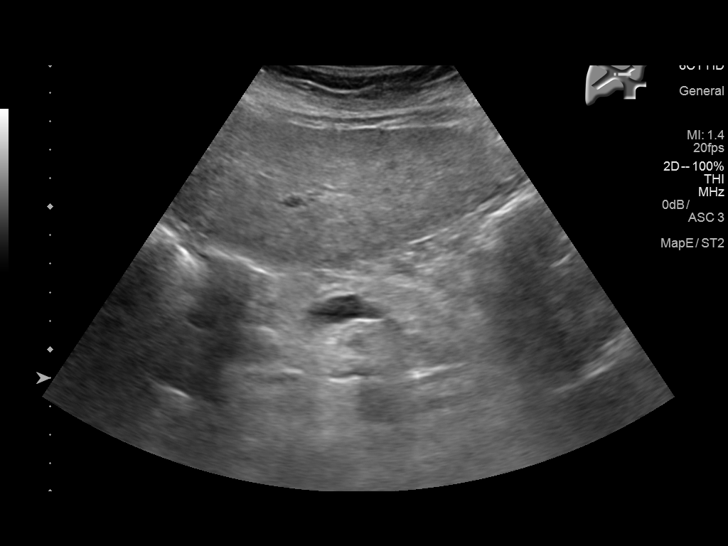
[im 22/85]
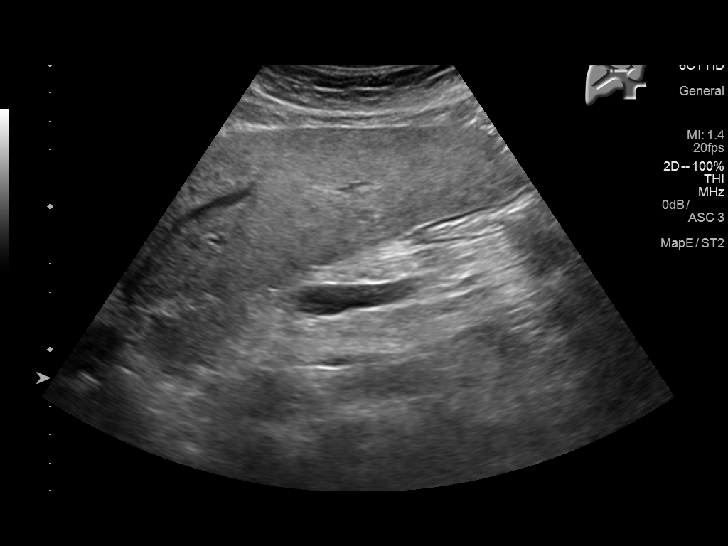
[im 29/85]
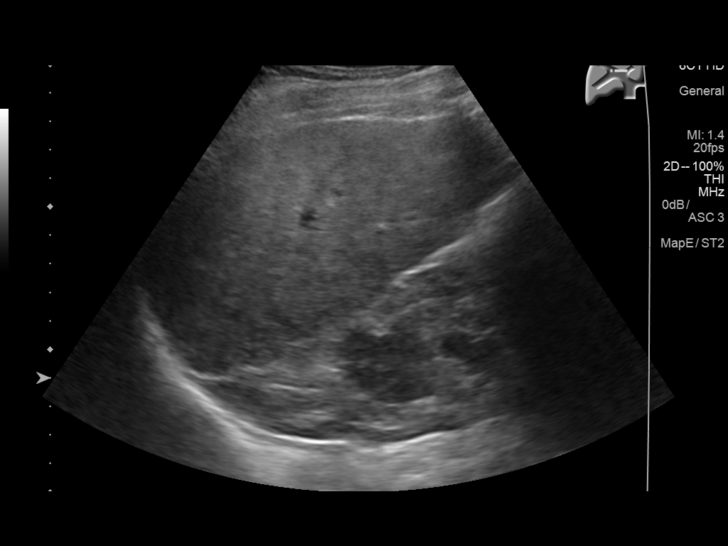
[im 32/85]
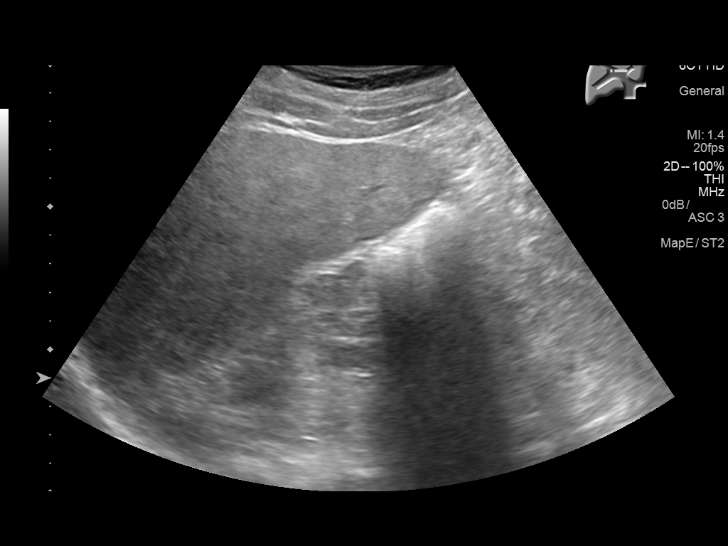
[im 39/85]
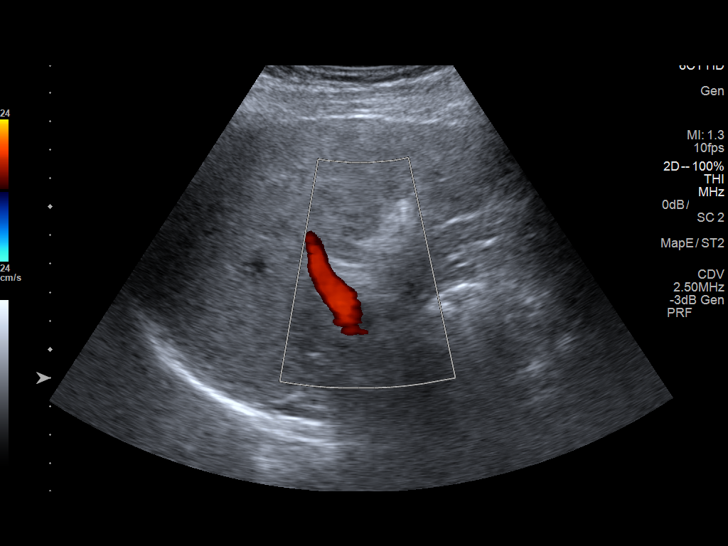
[im 46/85]
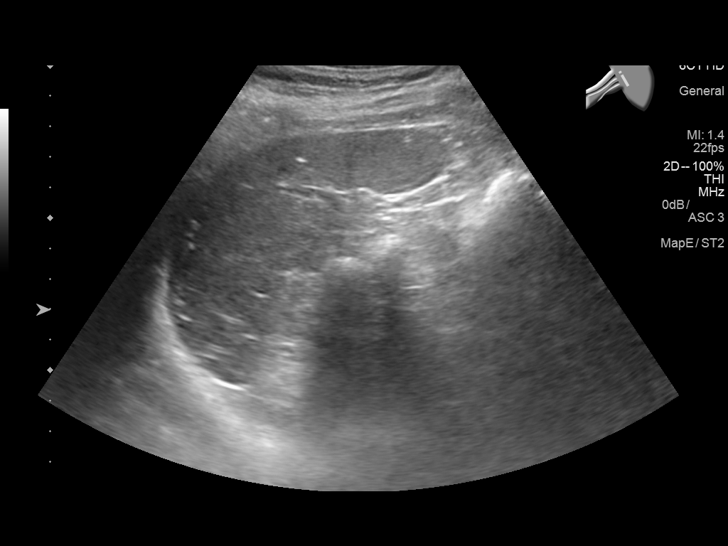
[im 53/85]
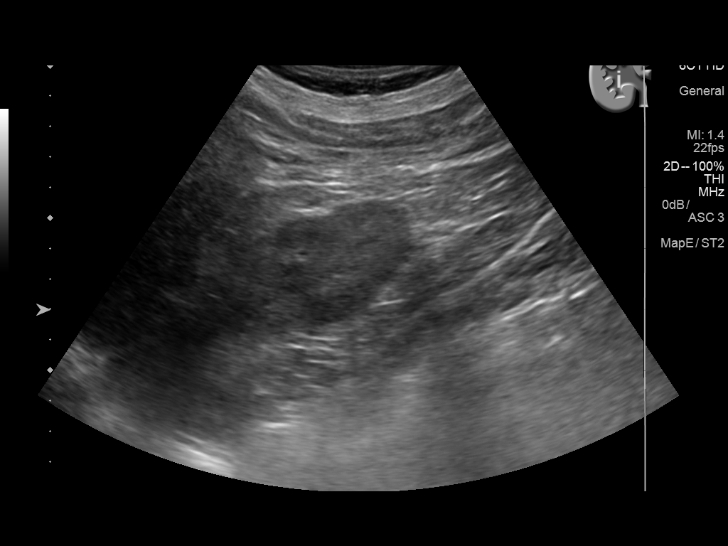
[im 57/85]
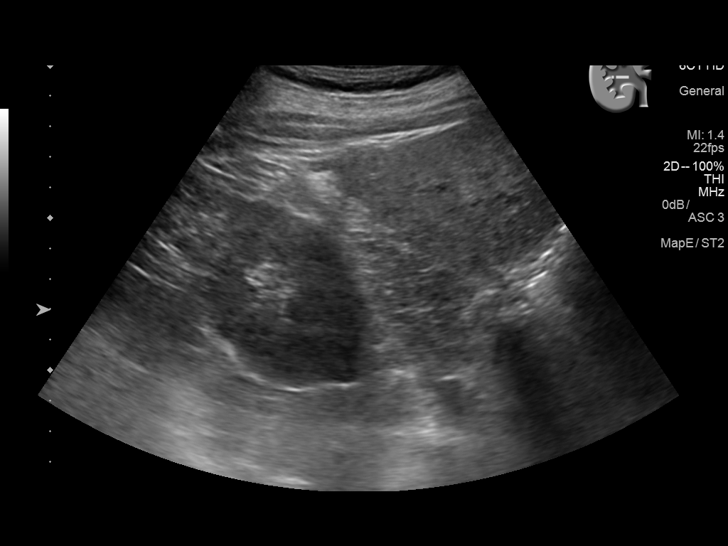
[im 64/85]
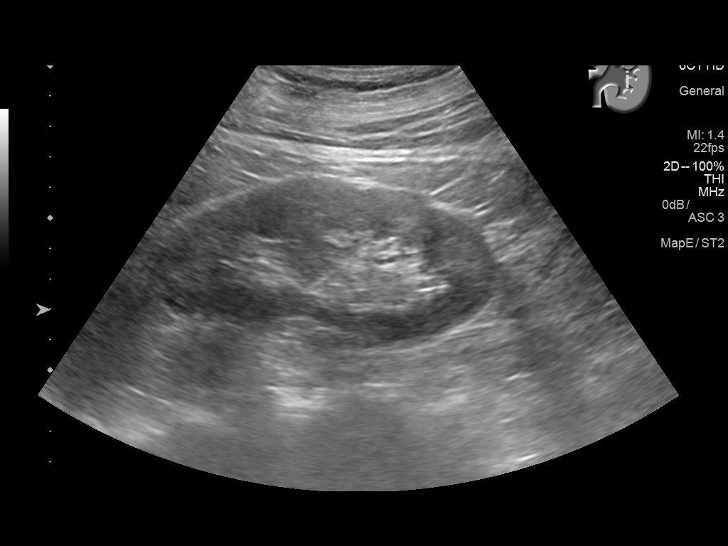
[im 71/85]
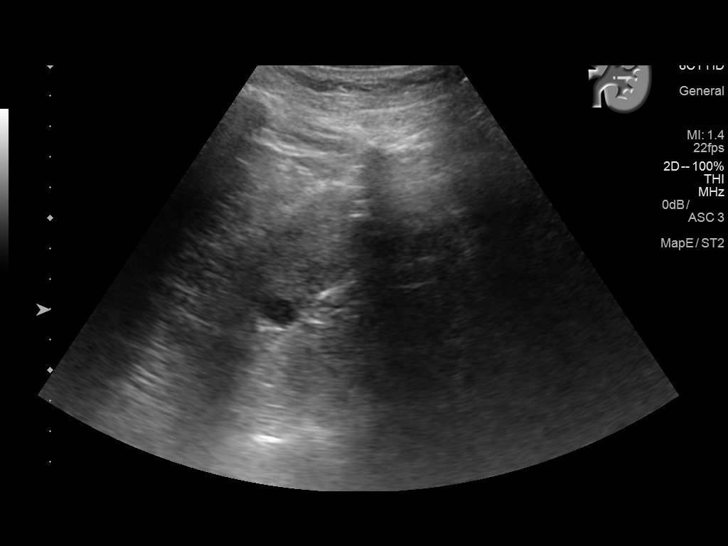
[im 78/85]
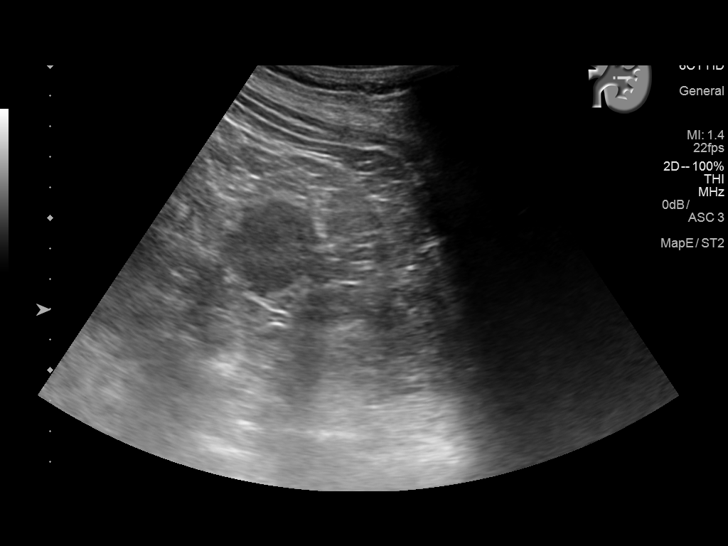
[im 85/85]
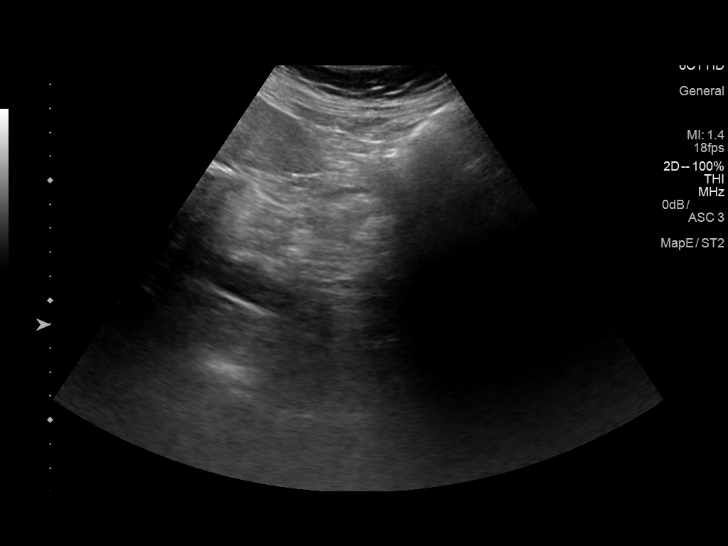

[14 of 25 positions shown; findings below may reference images not displayed]

FINDINGS: Gallbladder: Surgically removed

Common bile duct: Diameter: 2.6 mm

Liver: Diffusely increased in echogenicity with nodular contour
consistent with the given clinical history of cirrhosis. No focal
mass lesion is noted. Portal vein is patent on color Doppler imaging
with normal direction of blood flow towards the liver.

IVC: No abnormality visualized.

Pancreas: Visualized portion unremarkable.

Spleen: Normal in size.  Scattered calcified granulomas are noted.

Right Kidney: Length: 11.5 cm.. Echogenicity within normal limits.
No mass or hydronephrosis visualized.

Left Kidney: Length: 11.9 cm.. Small 1 cm cyst is noted similar to
that seen on prior CT.

Abdominal aorta: No aneurysm visualized.

Other findings: None.
IMPRESSION: Changes consistent with underlying cirrhosis.

Prior granulomatous disease.

Stable left renal cyst.

## 2019-01-11 ENCOUNTER — Telehealth: Payer: Self-pay | Admitting: *Deleted

## 2019-01-11 NOTE — Telephone Encounter (Signed)
   TELEPHONE CALL NOTE  This patient has been deemed a candidate for follow-up tele-health visit to limit community exposure during the Covid-19 pandemic. I spoke with the patient via phone to discuss instructions. . The patient will receive a phone call 2-3 days prior to their E-Visit at which time consent will be verbally confirmed.   A Virtual Office Visit appointment type has been scheduled for 6/23 with [facility], "TELEPHONE"  Raiford Simmonds, RN 01/11/2019 2:45 PM

## 2019-01-17 ENCOUNTER — Telehealth: Payer: Self-pay | Admitting: *Deleted

## 2019-01-17 ENCOUNTER — Telehealth (INDEPENDENT_AMBULATORY_CARE_PROVIDER_SITE_OTHER): Payer: PPO | Admitting: Cardiology

## 2019-01-17 ENCOUNTER — Encounter: Payer: Self-pay | Admitting: Cardiology

## 2019-01-17 VITALS — BP 151/76 | HR 98 | Ht <= 58 in | Wt 138.0 lb

## 2019-01-17 DIAGNOSIS — I700 Atherosclerosis of aorta: Secondary | ICD-10-CM | POA: Diagnosis not present

## 2019-01-17 DIAGNOSIS — I10 Essential (primary) hypertension: Secondary | ICD-10-CM

## 2019-01-17 DIAGNOSIS — E1169 Type 2 diabetes mellitus with other specified complication: Secondary | ICD-10-CM | POA: Diagnosis not present

## 2019-01-17 DIAGNOSIS — F17200 Nicotine dependence, unspecified, uncomplicated: Secondary | ICD-10-CM

## 2019-01-17 DIAGNOSIS — E785 Hyperlipidemia, unspecified: Secondary | ICD-10-CM

## 2019-01-17 DIAGNOSIS — I2510 Atherosclerotic heart disease of native coronary artery without angina pectoris: Secondary | ICD-10-CM

## 2019-01-17 MED ORDER — LISINOPRIL 10 MG PO TABS: 10.0000 mg | ORAL_TABLET | Freq: Every day | ORAL | 0 refills | Status: DC

## 2019-01-17 MED ORDER — ISOSORBIDE MONONITRATE ER 60 MG PO TB24: 60.0000 mg | ORAL_TABLET | Freq: Every day | ORAL | 3 refills | Status: DC

## 2019-01-17 MED ORDER — BISOPROLOL FUMARATE 5 MG PO TABS: 2.5000 mg | ORAL_TABLET | Freq: Every day | ORAL | 6 refills | Status: DC

## 2019-01-17 NOTE — Assessment & Plan Note (Signed)
She does not really seem to me that much of the contemplative stage.  We talked about options going forward and I did think that Wellbutrin would not be an unreasonable way to start.  Did not do well with Nicorette gum.  Will defer to PCP, who will likely see her more frequently.Marland Kitchen

## 2019-01-17 NOTE — Telephone Encounter (Signed)
Spoke to patient - instruction given for tele-visit 6/23 . avs summary will be mailed to patient.

## 2019-01-17 NOTE — Assessment & Plan Note (Signed)
Somewhat labile blood pressure.  I reduced her to 5 mg of lisinopril but then she is back on 10.  Plan for now will be for her to take an extra 1/2 tablet of bisoprolol for high blood pressure such as they are today, but otherwise if she has low pressures in the morning, she would hold her lisinopril for the day.

## 2019-01-17 NOTE — Assessment & Plan Note (Signed)
Relatively recent cath showed moderate RCA disease that was negative by FFR. She is complaining about some intermittent episodes of chest discomfort, but the only issue is that these occur maybe once a week and not with routine activity.  Hard to say that this is true angina.  I suspect is more musculoskeletal in nature since it is improved with Percogesic.  On the off chance that she might have some microvascular disease and is scared is a medical history, I will increase her Imdur to 60 mg daily.  LDL is currently less than 100 by recent check.  Being followed by PCP.  Now currently on rosuvastatin which she takes 2 days a week. Plan: We will try to increase to 3 days a week.  Is on ACE inhibitor and beta-blocker.

## 2019-01-17 NOTE — Assessment & Plan Note (Addendum)
Currently taking 20 mg 2 times a week Crestor, will increase to at least 3 times a week.  Lipids are close to goal but would try to shoot for less than 70 given the presence of CAD.  Had been on WelChol, but not currently listed.  We will monitor for now with the 1 more day of statin.Marland Kitchen  He is currently on glipizide plus insulin.  Not able to tolerate metformin

## 2019-01-17 NOTE — Patient Instructions (Addendum)
Medication Instructions:    Increase Imdur to 60 mg (can take 2 tab of existing dose).  IF SBP < 105 mmHg - hold Lisinopril  If SBP > 150 mmHg - take full pill of Bisoprolol (5 mg) otherwise continue 1/2 tab.  If you need a refill on your cardiac medications before your next appointment, please call your pharmacy.   Lab work:  none    Testing/Procedures:  none  Follow-Up: At [facility], you and your health needs are our priority.  As part of our continuing mission to provide you with exceptional heart care, we have created designated Provider Care Teams.  These Care Teams include your primary Cardiologist (physician) and Advanced Practice Providers (APPs -  Physician Assistants and Nurse Practitioners) who all work together to provide you with the care you need, when you need it. . You will need a follow up appointment in 8 months Feb 2021.  Please call our office 2 months in advance to schedule this appointment.  You may see Glenetta Hew, MD or one of the following Advanced Practice Providers on your designated Care Team:   . Rosaria Ferries, PA-C . Jory Sims, DNP, ANP  Any Other Special Instructions Will Be Listed Below (If Applicable).

## 2019-01-17 NOTE — Progress Notes (Signed)
Virtual Visit via Telephone Note   This visit type was conducted due to national recommendations for restrictions regarding the COVID-19 Pandemic (e.g. social distancing) in an effort to limit this patient's exposure and mitigate transmission in our community.  Due to her co-morbid illnesses, this patient is at least at moderate risk for complications without adequate follow up.  This format is felt to be most appropriate for this patient at this time.  The patient did not have access to video technology/had technical difficulties with video requiring transitioning to audio format only (telephone).  All issues noted in this document were discussed and addressed.  No physical exam could be performed with this format.  Please refer to the patient's chart for her  consent to telehealth for [facility].   Patient has given verbal permission to conduct this visit via virtual appointment and to bill insurance 01/17/2019 10:52 PM     Evaluation Performed:  Follow-up visit  Date:  01/17/2019   ID:  Sara Lamb, Sara Lamb 1951/04/01, MRN [id number]  Patient Location: Home Provider Location: Home  PCP:  Unk Pinto, MD  Cardiologist:  Glenetta Hew, MD  Electrophysiologist:  None   Chief Complaint:  Intermittent chest pains.  History of Present Illness:    Sara Lamb is a 68 y.o. female with PMH notable for DM-2, HTH, HLD & smoker/COPD,  & NASH with Fam Hx of CAD found to have mild single-vessel disease on cardiac catheterization who presents via audio/video conferencing for a telehealth visit today.  Sara Lamb was last seen in October 2018 for chest pain and murmur.  Was initially seen in July 2019.      2D echo March 03, 2018: Normal LV function.  EF 60 to 65%.  No RWMMA.  GRII DD.  Moderate LA dilation.  Cardiac Cath April 12, 2018: pRCA ~60% - FFR 0.89. Otherwise mild LCx disease.,  Mildly calcified vessels.  Normal LVEDP.  Normal EF ~65%.   Interval History:  For  past few months has been noting fatigue with some chest tightness - associated with stress or after carrying in groceries (after shopping) & / or doing laundry.   Usually takes Percogesic  & or Xanax.  Has not taking SL NTG (scared to)> Maybe occurs ~1 x / week.  Usually tries to go take a nap & things settle out.   Occasionally gets SOB lying down - or wakes up gasping for air -- not c/w PND.   Mostly limited by R Leg - OA related pain that keeps her from walking like she used to.  Tries to do exercise at least 3 x / week.   Rare palpitations.    Has ups & downs of BP - but quite dizzy (1-2 near falls) when BP is low. --> Orthostatic dizziness.   Not taking Metformin -b/c indicates that when she was on it before, had TIA type Sx.   Cardiovascular ROS: positive for - chest pain, dyspnea on exertion and as noted above negative for - edema, irregular heartbeat, orthopnea, palpitations, paroxysmal nocturnal dyspnea, rapid heart rate or shortness of breath, syncope/near syncope, TIA/amaurosis fugax   The patient does not have symptoms concerning for COVID-19 infection (fever, chills, cough, or new shortness of breath).  The patient is practicing social distancing.  ROS:  Please see the history of present illness.    Review of Systems  Constitutional: Negative for chills, fever, malaise/fatigue and weight loss.  HENT: Negative for congestion and nosebleeds.  Respiratory: Negative for cough and shortness of breath.   Gastrointestinal: Negative for blood in stool, constipation, diarrhea, heartburn, melena, nausea and vomiting.  Genitourinary: Negative for hematuria.  Musculoskeletal: Positive for joint pain (still bothered by R leg - from prior injury -- has to adjust her walking to avoid falling). Negative for falls (1-2 "near falls" - got dizzy standing up too fast.).  Neurological: Positive for dizziness (occasiona). Negative for focal weakness, weakness and headaches.    Psychiatric/Behavioral: Negative for depression. The patient is nervous/anxious and has insomnia.   All other systems reviewed and are negative.   Past Medical History:  Diagnosis Date   Anxiety    Anxiety disorder    Cirrhosis ([place])    COPD (chronic obstructive pulmonary disease) ([place])    followed by pcp--  last exacerbation 11/ 2017   Coronary artery calcification seen on CT scan    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--  "gets real sharp stabbing pain over eye and speech screws up"  pt states takes excederin migraine and lies down (pt stated was told if she "if come to ER again they would send her to psych ward")   Hyperlipidemia    Hypertension    OA (osteoarthritis)    back, hips, knees, feet   Osteoporosis    Polyp of colon, hyperplastic    Pulmonary nodules    per CT chest 08-28-2016  -- bilateral upper lobe nodules   Stroke [facility])    Tubular adenoma of colon    Type 2 diabetes mellitus ([place])    followed by pcp--  last A1c 9.1 on 08-01-2016   UTI (urinary tract infection)    Wears dentures    Past Surgical History:  Procedure Laterality Date   BILATERAL SALPINGOOPHORECTOMY  1997   via Laparoscopy   CHOLECYSTECTOMY N/A 09/30/2016   Procedure: LAPAROSCOPIC CHOLECYSTECTOMY;  Surgeon: Mickeal Skinner, MD;  Location: [facility];  Service: General;  Laterality: N/A;   INTRAVASCULAR PRESSURE WIRE/FFR STUDY N/A 04/12/2018   Procedure: INTRAVASCULAR PRESSURE WIRE/FFR STUDY;  Surgeon: Leonie Man, MD;  Location: [facility];  Service: Cardiovascular;  Laterality: N/A;   KNEE ARTHROSCOPY Right 11/2014   LEFT HEART CATH AND CORONARY ANGIOGRAPHY N/A 04/12/2018   Procedure: LEFT HEART CATH AND CORONARY ANGIOGRAPHY;  Surgeon: Leonie Man, MD;  Location: [place];  Service: Cardiovascular;  Laterality: N/A;   TRANSTHORACIC  ECHOCARDIOGRAM  02/2018    Normal LV function.  EF 60 to 65%.  No RWMMA.  GRII DD.  Moderate LA dilation.   VAGINAL HYSTERECTOMY  age 68     Current Meds  Medication Sig   albuterol (PROVENTIL) (2.5 MG/3ML) 0.083% nebulizer solution Take 3 mLs (2.5 mg total) by nebulization every 6 (six) hours as needed for wheezing or shortness of breath.   ALPRAZolam (XANAX) 1 MG tablet Take 1/2 to 1 tablet by MOUTH two OR three times daily AS NEEDED FOR anxiety. Limit TO five DAYS PER WEEK.]   aspirin EC 81 MG tablet Take 1 tablet (81 mg total) by mouth daily.   aspirin-acetaminophen-caffeine (EXCEDRIN MIGRAINE) 250-250-65 MG tablet Take 1-2 tablets by mouth as needed for headache or migraine.    B Complex-C-Folic Acid (STRESS FORMULA PO) Take 1 tablet by mouth 2 (two) times daily as needed (STRESS).   bisoprolol (ZEBETA)  5 MG tablet Take 0.5 tablets (2.5 mg total) by mouth daily. If blood pressure is greater than 150 , take a whole tablet that day.   budesonide-formoterol (SYMBICORT) 160-4.5 MCG/ACT inhaler Inhale 2 puffs into the lungs 2 (two) times daily. (Patient taking differently: Inhale 2 puffs into the lungs 2 (two) times daily as needed (for flares). )   Cholecalciferol (VITAMIN D3) 1000 units CAPS Take 1,000 Units by mouth daily.    colesevelam (WELCHOL) 625 MG tablet Take 625 mg by mouth every morning.    cyclobenzaprine (FLEXERIL) 10 MG tablet Take 1 tablet (10 mg total) by mouth at bedtime as needed for muscle spasms.   diphenhydrAMINE HCl, Sleep, (SLEEP AID) 50 MG CAPS Take 50 mg by mouth at bedtime.    Diphenhydramine-APAP (PERCOGESIC) 12.5-325 MG TABS Take 1 tablet by mouth 3 (three) times daily.    famotidine (PEPCID) 20 MG tablet Take 1 tablet (20 mg total) by mouth 2 (two) times daily. (Patient taking differently: Take 20 mg by mouth daily. )   fluconazole (DIFLUCAN) 150 MG tablet Take 1 tablet (150 mg total) by mouth daily.   gabapentin (NEURONTIN) 600 MG tablet Take 1/2 to  1 tablet 2 to 3 x /Daily for Diabetic Neuropathy Pain & Costochondritis Chest Wall Pain   glimepiride (AMARYL) 4 MG tablet TAKE ONE TABLET BY MOUTH TWICE DAILY BEFORE LUNCH AND SUPPER   Insulin Glargine (LANTUS SOLOSTAR) 100 UNIT/ML Solostar Pen Inject 16 Units into the skin at bedtime.   Insulin Pen Needle (CAREFINE PEN NEEDLES) 32G X 4 MM MISC Use 1x a day   lisinopril (ZESTRIL) 10 MG tablet Take 1 tablet (10 mg total) by mouth daily. If systolic blood pressure less than 105,do not take that day.   nitroGLYCERIN (NITROSTAT) 0.4 MG SL tablet Place 1 tablet (0.4 mg total) under the tongue every 5 (five) minutes as needed for chest pain.   rosuvastatin (CRESTOR) 40 MG tablet Take 0.5 tablets (20 mg total) by mouth See admin instructions. Take 1/2 tab twice weekly for cholesterol. ONLY ON Monday AND Friday   [DISCONTINUED] bisoprolol (ZEBETA) 5 MG tablet TAKE 1/2 TABLET BY MOUTH ONCE DAILY   [DISCONTINUED] isosorbide mononitrate (IMDUR) 30 MG 24 hr tablet Take 1 tablet (30 mg total) by mouth daily.   [DISCONTINUED] lisinopril (ZESTRIL) 10 MG tablet TAKE ONE TABLET BY MOUTH ONCE DAILY     Allergies:   Actos [pioglitazone], Glyburide, Metformin and related, Onglyza [saxagliptin], Insulins, Janumet [sitagliptin-metformin hcl], and Penicillins   Social History   Tobacco Use   Smoking status: Current Every Day Smoker    Packs/day: 0.50    Years: 38.00    Pack years: 19.00    Types: Cigarettes   Smokeless tobacco: Never Used  Substance Use Topics   Alcohol use: No    Alcohol/week: 0.0 standard drinks   Drug use: No     Family Hx: The patient's family history includes CAD in her maternal aunt, maternal uncle, and sister; CAD (age of onset: 73) in her mother; Cancer in her father; Coronary artery disease (age of onset: 63) in her brother; Diabetes in her brother, father, and mother; Heart attack in her maternal grandmother; Heart attack (age of onset: 28) in her sister;  Hyperlipidemia in her brother, sister, and sister; Hypertension in her brother, father, and sister; Stomach cancer in her mother; Stroke in her mother. There is no history of Colon cancer, Esophageal cancer, or Rectal cancer.   Prior CV studies:   The following  studies were reviewed today:  None since last visit.  Labs/Other Tests and Data Reviewed:    EKG:  No ECG reviewed.  Recent Labs: 11/24/2018: ALT 31; BUN 13; Creat 0.79; Hemoglobin 14.7; Magnesium 1.8; Platelets 254; Potassium 4.8; Sodium 129; TSH 0.80   Recent Lipid Panel - PCP Lab Results  Component Value Date/Time   CHOL 173 11/24/2018 12:38 PM   TRIG 252 (H) 11/24/2018 12:38 PM   HDL 45 (L) 11/24/2018 12:38 PM   CHOLHDL 3.8 11/24/2018 12:38 PM   LDLCALC 95 11/24/2018 12:38 PM    Wt Readings from Last 3 Encounters:  01/17/19 138 lb (62.6 kg)  11/24/18 139 lb (63 kg)  08/16/18 142 lb 9.6 oz (64.7 kg)     Objective:    Vital Signs:  BP (!) 151/76    Pulse 98    Ht 4' 10"  (1.473 m)    Wt 138 lb (62.6 kg)    BMI 28.84 kg/m   VITAL SIGNS:  reviewed Well nourished, well developed female in no obvious acute distress. A&O x 3.  normal Mood & Affect Non-labored respirations No edema  ASSESSMENT & PLAN:    Problem List Items Addressed This Visit    Tobacco use disorder (Chronic)    She does not really seem to me that much of the contemplative stage.  We talked about options going forward and I did think that Wellbutrin would not be an unreasonable way to start.  Did not do well with Nicorette gum.  Will defer to PCP, who will likely see her more frequently.Marland Kitchen      Hyperlipidemia associated with type 2 diabetes mellitus (HCC) (Chronic)    Currently taking 20 mg 2 times a week Crestor, will increase to at least 3 times a week.  Lipids are close to goal but would try to shoot for less than 70 given the presence of CAD.  Had been on WelChol, but not currently listed.  We will monitor for now with the 1 more day of  statin.Marland Kitchen  He is currently on glipizide plus insulin.  Not able to tolerate metformin      Relevant Medications   lisinopril (ZESTRIL) 10 MG tablet   Essential hypertension - Primary (Chronic)    Somewhat labile blood pressure.  I reduced her to 5 mg of lisinopril but then she is back on 10.  Plan for now will be for her to take an extra 1/2 tablet of bisoprolol for high blood pressure such as they are today, but otherwise if she has low pressures in the morning, she would hold her lisinopril for the day.      Relevant Medications   isosorbide mononitrate (IMDUR) 60 MG 24 hr tablet   lisinopril (ZESTRIL) 10 MG tablet   bisoprolol (ZEBETA) 5 MG tablet   Coronary artery disease, non-occlusive: RCA 60%, FFR Negative. (Chronic)    Relatively recent cath showed moderate RCA disease that was negative by FFR. She is complaining about some intermittent episodes of chest discomfort, but the only issue is that these occur maybe once a week and not with routine activity.  Hard to say that this is true angina.  I suspect is more musculoskeletal in nature since it is improved with Percogesic.  On the off chance that she might have some microvascular disease and is scared is a medical history, I will increase her Imdur to 60 mg daily.  LDL is currently less than 100 by recent check.  Being followed by PCP.  Now currently on rosuvastatin which she takes 2 days a week. Plan: We will try to increase to 3 days a week.  Is on ACE inhibitor and beta-blocker.       Relevant Medications   isosorbide mononitrate (IMDUR) 60 MG 24 hr tablet   lisinopril (ZESTRIL) 10 MG tablet   bisoprolol (ZEBETA) 5 MG tablet   Atherosclerosis of abdominal aorta (HCC) (Chronic)   Relevant Medications   isosorbide mononitrate (IMDUR) 60 MG 24 hr tablet   lisinopril (ZESTRIL) 10 MG tablet   bisoprolol (ZEBETA) 5 MG tablet      COVID-19 Education: The signs and symptoms of COVID-19 were discussed with the patient and  how to seek care for testing (follow up with PCP or arrange E-visit).   The importance of social distancing was discussed today.  Time:   Today, I have spent 25 minutes with the patient with telehealth technology discussing the above problems.  Plus 5 min in charting.   Medication Adjustments/Labs and Tests Ordered: Current medicines are reviewed at length with the patient today.  Concerns regarding medicines are outlined above.  Medication Instructions:    Increase Imdur to 60 mg (can take 2 tab of existing dose).  IF SBP < 105 mmHg - hold Lisinopril  If SBP > 150 mmHg - take full pill of Bisoprolol (5 mg) otherwise continue 1/2 tab.  Tests Ordered: No orders of the defined types were placed in this encounter.  none  Medication Changes: Meds ordered this encounter  Medications   isosorbide mononitrate (IMDUR) 60 MG 24 hr tablet    Sig: Take 1 tablet (60 mg total) by mouth daily.    Dispense:  90 tablet    Refill:  3   lisinopril (ZESTRIL) 10 MG tablet    Sig: Take 1 tablet (10 mg total) by mouth daily. If systolic blood pressure less than 105,do not take that day.    Dispense:  90 tablet    Refill:  0   bisoprolol (ZEBETA) 5 MG tablet    Sig: Take 0.5 tablets (2.5 mg total) by mouth daily. If blood pressure is greater than 150 , take a whole tablet that day.    Dispense:  15 tablet    Refill:  6    This prescription was filled on 09/28/2018. Any refills authorized will be placed on file.  see above.   Disposition:  Follow up in 8 month(s)    Signed, Glenetta Hew, MD  01/17/2019 10:52 PM    [place]

## 2019-01-24 ENCOUNTER — Other Ambulatory Visit: Payer: Self-pay | Admitting: Physician Assistant

## 2019-01-24 DIAGNOSIS — F419 Anxiety disorder, unspecified: Secondary | ICD-10-CM

## 2019-01-24 DIAGNOSIS — Z79899 Other long term (current) drug therapy: Secondary | ICD-10-CM

## 2019-02-15 ENCOUNTER — Encounter: Payer: Self-pay | Admitting: Gastroenterology

## 2019-02-23 ENCOUNTER — Encounter: Payer: Self-pay | Admitting: Physician Assistant

## 2019-02-27 ENCOUNTER — Other Ambulatory Visit: Payer: Self-pay | Admitting: *Deleted

## 2019-02-27 ENCOUNTER — Other Ambulatory Visit: Payer: Self-pay | Admitting: Internal Medicine

## 2019-02-27 DIAGNOSIS — E1140 Type 2 diabetes mellitus with diabetic neuropathy, unspecified: Secondary | ICD-10-CM

## 2019-02-27 DIAGNOSIS — Z79899 Other long term (current) drug therapy: Secondary | ICD-10-CM

## 2019-02-27 DIAGNOSIS — E1151 Type 2 diabetes mellitus with diabetic peripheral angiopathy without gangrene: Secondary | ICD-10-CM

## 2019-02-27 DIAGNOSIS — M940 Chondrocostal junction syndrome [Tietze]: Secondary | ICD-10-CM

## 2019-02-27 MED ORDER — GLIMEPIRIDE 4 MG PO TABS: ORAL_TABLET | ORAL | 1 refills | Status: DC

## 2019-03-06 ENCOUNTER — Telehealth: Payer: Self-pay | Admitting: Physician Assistant

## 2019-03-06 NOTE — Telephone Encounter (Signed)
ERROR

## 2019-03-06 NOTE — Progress Notes (Signed)
CPE and 3 MONTH  Assessment:    Essential hypertension - continue medications, DASH diet, exercise and monitor at home. Call if greater than 130/80.  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 Advised to quit smoking - Hemoglobin A1c  Chronic obstructive pulmonary disease, unspecified COPD type ([place]) Advised to quit smoking.   T2_NIDDM, poorly controlled Discussed general issues about diabetes pathophysiology and management., Educational material distributed., Suggested low cholesterol diet., Encouraged aerobic exercise., Discussed foot care., Reminded to get yearly retinal exam. - Hemoglobin J6B   Nonalcoholic hepatosteatosis Weight loss advised, monitor sugars.  - Hepatic function panel  Atherosclerosis of abdominal aorta (HCC) Control blood pressure, cholesterol, glucose, increase exercise.  Advised to stop smoking - Lipid panel - Hemoglobin A1c   Hyperlipidemia, unspecified hyperlipidemia type Not willing to get on medication, will check cholesterol and attempt - Lipid panel  Tobacco use disorder Smoking cessation-  instruction/counseling given, counseled patient on the dangers of tobacco use, advised patient to stop smoking, and reviewed strategies to maximize success, continue chantix    Vitamin D deficiency Continue supplement   Medication management - Magnesium   Anxiety  Poor compliance with medication LONG discussion about compliance, needs to stay on medication  Osteoporosis, unspecified osteoporosis type, unspecified pathological fracture presence Has had DEXA, declines treatment  Encounter for general adult  medical examination with abnormal findings  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 SEE EYE DOCTOR  Liver cirrhosis secondary to NASH ([place]) -     Hepatic function panel  BMI 29.0-29.9,adult  Overweight  - long discussion about weight loss, diet, and exercise -recommended diet heavy in fruits and veggies and low in animal meats, cheeses, and dairy products  Hyperlipidemia associated with type 2 diabetes mellitus (HCC) check lipids decrease fatty foods increase activity.   T2_NIDDM, poorly controlled -     Hemoglobin A1c -     EKG 12-Lead - increase basal insulin based off fasting sugars.   Cognitive dysfunction Get better control of sugars.  Right leg swelling -     D-dimer, quantitative (not at [facility]) Elevate leg, compression socks.   Other orders -     traZODone (DESYREL) 50 MG tablet; 1/2-1 tablet for sleep -     MICROSCOPIC MESSAGE -     fluconazole (DIFLUCAN) 150 MG tablet; Take 1 tablet (150 mg total) by mouth daily.    Over 40 minutes of exam, counseling, chart review and critical decision making was performed Future Appointments  Date Time Provider [place]  08/22/2019  2:00 PM Garnet Sierras, NP [facility] None  03/12/2020  3:00 PM Vicie Mutters, PA-C [facility] None     Subjective:  Sara Lamb is a 68 y.o. female who presents for CPE and 3 month follow up for uncontrolled DM.   She is very noncompliant with test, vaccines, and has an aversion to medication with " reactions" to the majority of medications.   She complains of falling x 3, no dizziness with it, felt shaky after fall but no CP, SOB, diaphoresis, LOSS of bowel or bladder. She  has had some right leg swelling and right knee pain since  her fall, hurt her lower back as well. No warmth, swelling.  CT chest 10/2017  She is on xanax 1 pill every night due to sleep and she will take another half due to stress and shaking. She is doing all the driving and has lot of stress with Sara Lamb, she is asking for more xanax or a higher dose.   She has cirrhosis due to NASH, has RUQ pain, following with GI,.EGD 01/2017, no varices.    Her blood pressure has not been controlled at home, today their BP is    Had cath 2018 that showed minimal plaque, 60% RCA.  She does not workout. She denies chest pain, shortness of breath, dizziness. She has COPD, continues to smoke.    BMI is Body mass index is 29.09 kg/m., she is working on diet and exercise. Wt Readings from Last 3 Encounters:  03/08/19 139 lb 3.2 oz (63.1 kg)  01/17/19 138 lb (62.6 kg)  11/24/18 139 lb (63 kg)    She is not on cholesterol medication . Her cholesterol is not at goal. The cholesterol last visit was:  She is suppose to be on crestor but she has a reaction and could not.  Lab Results  Component Value Date   CHOL 173 11/24/2018   HDL 45 (L) 11/24/2018   LDLCALC 95 11/24/2018   TRIG 252 (H) 11/24/2018   CHOLHDL 3.8 11/24/2018   She has not been working on diet and exercise for Diabetes  with diabetic chronic kidney disease she is on ACE/ARB With hyperlipidemia  she is not on bASA  she is on amaryl 4 mg BID and welchol 1 capsules a day she is on lantus 14 units a day and denies polydipsia, polyuria and visual disturbances. Last A1C was:  Lab Results  Component Value Date   HGBA1C 13.3 (H) 11/24/2018   Last GFR: Lab Results  Component Value Date   GFRNONAA 77 11/24/2018   Patient is on Vitamin D supplement.   Lab Results  Component Value Date   VD25OH 16 (L) 11/24/2018     BMI is Body mass index is 29.09 kg/m., she is working on diet and exercise. Wt Readings from Last 3 Encounters:  03/08/19 139 lb 3.2 oz (63.1 kg)  01/17/19 138 lb (62.6 kg)   11/24/18 139 lb (63 kg)     Medication Review: Current Outpatient Medications on File Prior to Visit  Medication Sig  . albuterol (PROVENTIL) (2.5 MG/3ML) 0.083% nebulizer solution Take 3 mLs (2.5 mg total) by nebulization every 6 (six) hours as needed for wheezing or shortness of breath.  . ALPRAZolam (XANAX) 1 MG tablet Take 1/2-1 tablet 2 - 3 x /day ONLY if needed for Panic or Anxiety Attack &  limit to 5 days /week to avoid addiction  . aspirin EC 81 MG tablet Take 1 tablet (81 mg total) by mouth daily.  Marland Kitchen aspirin-acetaminophen-caffeine (EXCEDRIN MIGRAINE) 250-250-65 MG tablet Take 1-2 tablets by mouth as needed for headache or migraine.   . B Complex-C-Folic Acid (STRESS FORMULA PO) Take 1 tablet by mouth 2 (two) times daily as needed (STRESS).  . bisoprolol (ZEBETA) 5 MG tablet Take 0.5 tablets (2.5 mg total) by mouth daily. If blood pressure is greater than 150 , take a whole tablet that day.  . budesonide-formoterol (SYMBICORT) 160-4.5 MCG/ACT inhaler Inhale 2 puffs into the lungs 2 (two) times daily. (Patient taking differently: Inhale 2 puffs into  the lungs 2 (two) times daily as needed (for flares). )  . Cholecalciferol (VITAMIN D3) 1000 units CAPS Take 1,000 Units by mouth daily.   . colesevelam (WELCHOL) 625 MG tablet Take 625 mg by mouth every morning.   . cyclobenzaprine (FLEXERIL) 10 MG tablet Take 1 tablet (10 mg total) by mouth at bedtime as needed for muscle spasms.  . diphenhydrAMINE HCl, Sleep, (SLEEP AID) 50 MG CAPS Take 50 mg by mouth at bedtime.   . Diphenhydramine-APAP (PERCOGESIC) 12.5-325 MG TABS Take 1 tablet by mouth 3 (three) times daily.   . famotidine (PEPCID) 20 MG tablet Take 1 tablet (20 mg total) by mouth 2 (two) times daily. (Patient taking differently: Take 20 mg by mouth daily. )  . fluconazole (DIFLUCAN) 150 MG tablet Take 1 tablet (150 mg total) by mouth daily.  Marland Kitchen gabapentin (NEURONTIN) 600 MG tablet TAKE 1/2 TO 1 TABLET BY MOUTH 2-3 times daily FOR  diabetic neuropathy pain AND CHEST wall pain.  Marland Kitchen glimepiride (AMARYL) 4 MG tablet TAKE ONE TABLET BY MOUTH TWICE DAILY BEFORE LUNCH AND SUPPER  . Insulin Glargine (LANTUS SOLOSTAR) 100 UNIT/ML Solostar Pen Inject 16 Units into the skin at bedtime.  . Insulin Pen Needle (CAREFINE PEN NEEDLES) 32G X 4 MM MISC Use 1x a day  . isosorbide mononitrate (IMDUR) 60 MG 24 hr tablet Take 1 tablet (60 mg total) by mouth daily.  Marland Kitchen lisinopril (ZESTRIL) 10 MG tablet Take 1 tablet (10 mg total) by mouth daily. If systolic blood pressure less than 105,do not take that day.  . nitroGLYCERIN (NITROSTAT) 0.4 MG SL tablet Place 1 tablet (0.4 mg total) under the tongue every 5 (five) minutes as needed for chest pain.  . rosuvastatin (CRESTOR) 40 MG tablet Take 0.5 tablets (20 mg total) by mouth See admin instructions. Take 1/2 tab twice weekly for cholesterol. ONLY ON Monday AND Friday   No current facility-administered medications on file prior to visit.      Allergies  Allergen Reactions  . Actos [Pioglitazone] Other (See Comments)    Patient stated that it elevated her BGL  . Glyburide Other (See Comments)    Patient stated that it elevated her BGL  . Metformin And Related     "metformin caused mini-strokes" and ELEVATED BS and also GLYBURIDE, ONGLYZA, ACTOS, INVOKANA caused elevated BS  . Onglyza [Saxagliptin] Other (See Comments)    Patient stated that it elevated her BGL  . Insulins Rash  . Janumet [Sitagliptin-Metformin Hcl] Rash and Other (See Comments)    Shakes/ rash  . Penicillins Rash and Other (See Comments)    Current Problems (verified) Patient Active Problem List   Diagnosis Date Noted  . Hyperlipidemia, mixed 11/24/2018  . T2_NIDDM, poorly controlled 11/24/2018  . Chronic hyponatremia 03/18/2018  . Coronary artery disease, non-occlusive: RCA 60%, FFR Negative. 02/16/2018  . Precordial chest pain 02/16/2018  . Cognitive dysfunction 03/27/2017  . Liver cirrhosis secondary to NASH ([place])  11/23/2016  . Osteoporosis 05/27/2016  . Atherosclerosis of abdominal aorta ([place]) 04/30/2016  . Type 2 diabetes mellitus with atherosclerosis of aorta ([place]) 04/30/2015  . COPD (chronic obstructive pulmonary disease) ([place]) 04/30/2015  . Tobacco use disorder 04/08/2015  . Poor compliance with medication 11/05/2014  . Vitamin D deficiency   . Anxiety   . Essential hypertension   . Hyperlipidemia associated with type 2 diabetes mellitus ([place])     Screening Tests Immunization History  Administered Date(s) Administered  . Hep A / Hep B 01/07/2017, 02/08/2017,  07/09/2017  . Td 06/26/2001  . Tdap 04/30/2015    Preventative care: Last colonoscopy: 2018 Last mammogram: 05/25/2016 OVERDUE Last pap smear/pelvic exam: declines  DEXA: 04/2016 MRI brain 2016 CT chest 10/2017 IMPRESSION: 1. Multiple scattered millimetric peripheral pulmonary nodules are unchanged and considered benign. 2. Aortic atherosclerosis (ICD10-170.0). Three-vessel coronary artery calcification. 3. Cirrhosis.  Prior vaccinations: DECLINES ALL VACCINES TD or Tdap:2016  Influenza: Declines  Pneumococcal: declines Prevnar13: declines Shingles/Zostavax: declines  Names of Other Physician/Practitioners you currently use: 1. [facility] here for primary care 2. none, eye doctor, encouraged to see eye doctor 3. Dentures dentist Patient Care Team: Unk Pinto, MD as PCP - General (Internal Medicine) Leonie Man, MD as PCP - Cardiology (Cardiology) Kinsinger, Arta Bruce, MD as Consulting Physician (General Surgery) Philemon Kingdom, MD as Consulting Physician (Internal Medicine)  SURGICAL HISTORY She  has a past surgical history that includes Vaginal hysterectomy (age 75); Bilateral salpingoophorectomy (1997); Knee arthroscopy (Right, 11/2014); Cholecystectomy (N/A, 09/30/2016); LEFT HEART CATH AND CORONARY ANGIOGRAPHY (N/A, 04/12/2018); INTRAVASCULAR PRESSURE WIRE/FFR  STUDY (N/A, 04/12/2018); and transthoracic echocardiogram (02/2018). FAMILY HISTORY Her family history includes CAD in her maternal aunt, maternal uncle, and sister; CAD (age of onset: 9) in her mother; Cancer in her father; Coronary artery disease (age of onset: 40) in her brother; Diabetes in her brother, father, and mother; Heart attack in her maternal grandmother; Heart attack (age of onset: 42) in her sister; Hyperlipidemia in her brother, sister, and sister; Hypertension in her brother, father, and sister; Stomach cancer in her mother; Stroke in her mother. SOCIAL HISTORY She  reports that she has been smoking cigarettes. She has a 19.00 pack-year smoking history. She has never used smokeless tobacco. She reports that she does not drink alcohol or use drugs.      Objective:     Today's Vitals   03/08/19 1502  Weight: 139 lb 3.2 oz (63.1 kg)  Height: 4' 10"  (1.473 m)  PainSc: 8   PainLoc: Back   Body mass index is 29.09 kg/m.  General appearance: alert, no distress, WD/WN, female HEENT: normocephalic, sclerae anicteric, TMs pearly, nares patent, no discharge or erythema, pharynx normal, she has dry cracking erythema at corners of her mouth.  Oral cavity: MMM, no lesions Neck: supple, no lymphadenopathy, no thyromegaly, no masses Heart: RRR, normal S1, S2, holosystolic murmur Lungs: decreased BS, with diffuse wheezing and rhonchi bilateral, no rales Abdomen: +bs, soft, obese, tender epigastric and RUQ pain to palpation,  no masses, no hepatomegaly, no splenomegaly Musculoskeletal: nontender, no swelling, no obvious deformity Extremities: no edema, no cyanosis, no clubbing Pulses: 2+ symmetric, upper and lower extremities, normal cap refill Neurological: alert, oriented x 3, CN2-12 intact, strength normal upper extremities and lower extremities, sensation normal throughout, DTRs 2+ throughout, no cerebellar signs, gait antalgic Psychiatric: normal affect, behavior normal, pleasant     Vicie Mutters, PA-C   03/08/2019

## 2019-03-06 NOTE — Telephone Encounter (Signed)
no cell phone to checkin for CPE, please advise patient checkin process.

## 2019-03-08 ENCOUNTER — Other Ambulatory Visit: Payer: Self-pay

## 2019-03-08 ENCOUNTER — Ambulatory Visit (INDEPENDENT_AMBULATORY_CARE_PROVIDER_SITE_OTHER): Payer: PPO | Admitting: Physician Assistant

## 2019-03-08 ENCOUNTER — Encounter: Payer: Self-pay | Admitting: Physician Assistant

## 2019-03-08 VITALS — BP 120/66 | HR 74 | Temp 97.2°F | Ht <= 58 in | Wt 139.2 lb

## 2019-03-08 DIAGNOSIS — Z0001 Encounter for general adult medical examination with abnormal findings: Secondary | ICD-10-CM

## 2019-03-08 DIAGNOSIS — M7989 Other specified soft tissue disorders: Secondary | ICD-10-CM

## 2019-03-08 DIAGNOSIS — E1169 Type 2 diabetes mellitus with other specified complication: Secondary | ICD-10-CM

## 2019-03-08 DIAGNOSIS — D649 Anemia, unspecified: Secondary | ICD-10-CM

## 2019-03-08 DIAGNOSIS — K7581 Nonalcoholic steatohepatitis (NASH): Secondary | ICD-10-CM

## 2019-03-08 DIAGNOSIS — E538 Deficiency of other specified B group vitamins: Secondary | ICD-10-CM

## 2019-03-08 DIAGNOSIS — I10 Essential (primary) hypertension: Secondary | ICD-10-CM | POA: Diagnosis not present

## 2019-03-08 DIAGNOSIS — E1165 Type 2 diabetes mellitus with hyperglycemia: Secondary | ICD-10-CM

## 2019-03-08 DIAGNOSIS — E559 Vitamin D deficiency, unspecified: Secondary | ICD-10-CM

## 2019-03-08 DIAGNOSIS — F419 Anxiety disorder, unspecified: Secondary | ICD-10-CM

## 2019-03-08 DIAGNOSIS — I2510 Atherosclerotic heart disease of native coronary artery without angina pectoris: Secondary | ICD-10-CM

## 2019-03-08 DIAGNOSIS — J449 Chronic obstructive pulmonary disease, unspecified: Secondary | ICD-10-CM

## 2019-03-08 DIAGNOSIS — M810 Age-related osteoporosis without current pathological fracture: Secondary | ICD-10-CM

## 2019-03-08 DIAGNOSIS — E1151 Type 2 diabetes mellitus with diabetic peripheral angiopathy without gangrene: Secondary | ICD-10-CM | POA: Diagnosis not present

## 2019-03-08 DIAGNOSIS — M109 Gout, unspecified: Secondary | ICD-10-CM

## 2019-03-08 DIAGNOSIS — E782 Mixed hyperlipidemia: Secondary | ICD-10-CM

## 2019-03-08 DIAGNOSIS — K7460 Unspecified cirrhosis of liver: Secondary | ICD-10-CM

## 2019-03-08 DIAGNOSIS — Z136 Encounter for screening for cardiovascular disorders: Secondary | ICD-10-CM

## 2019-03-08 DIAGNOSIS — F09 Unspecified mental disorder due to known physiological condition: Secondary | ICD-10-CM

## 2019-03-08 DIAGNOSIS — I700 Atherosclerosis of aorta: Secondary | ICD-10-CM

## 2019-03-08 DIAGNOSIS — Z9114 Patient's other noncompliance with medication regimen: Secondary | ICD-10-CM

## 2019-03-08 MED ORDER — TRAZODONE HCL 50 MG PO TABS: ORAL_TABLET | ORAL | 2 refills | Status: DC

## 2019-03-08 NOTE — Patient Instructions (Addendum)
TRAZODONE Cut the xanax at night to 1/2 and try this medication with it.  This is not habit forming, you will not build a tolerance to it Please start out at 65m or 1/2 pill, can increase to 556mat night with 1/2 of the xanax, and the max of this medication is 15081mt night.   Goal would be to take this at night and take the xanax AS needed for anxiety or really bad nights.  Side effects can be odd dreams, dry mouth, and drowsiness.   Get on pepcid every day x 2-4 weeks. Once daily.   Please remember only take the insulin and the glimepiride  WITH food, if you are sick or unable to eat DO NOT take your insulin. Also a low blood sugar is much more dangerous than a high blood sugar. Your brain needs 2 things, oxygen and sugar, so lets make sure it gets both. If at any time you have a question or concern, call the office or message us [place] MyCTempleYOU NEED TO CHECK YOUR SUGARS OCCASIONALLY ESPECIALLY IF YOU FEEL FUNNY!!   Your A1C is a measure of your sugar over the past 3 months and is not affected by what you have eaten over the past few days. Diabetes increases your chances of stroke and heart attack over 300 % and is the leading cause of blindness and kidney failure in the [place] make sure you decrease bad carbs like white bread, white rice, potatoes, corn, soft drinks, pasta, cereals, refined sugars, sweet tea, dried fruits, and fruit juice. Good carbs are okay to eat in moderation like sweet potatoes, brown rice, whole grain pasta/bread, most fruit (except dried fruit) and you can eat as many veggies as you want.   Greater than 6.5 is considered diabetic. Between 6.4 and 5.7 is prediabetic If your A1C is less than 5.7 you are NOT diabetic.  Targets for Glucose Readings: Time of Check Target for patients WITHOUT Diabetes Target for DIABETICS  Before Meals Less than 100  less than 150  Two hours after meals Less than 200  Less than 250    Recommendations For  Diabetic/Prediabetic Patients:   -  Take medications as prescribed  -  Recommend Dr JoeFara Oldenhrman's book "The End of Diabetes "  And "The End of Dieting"- Can get at  www.[place] and encourage also get the Audio CD book  - AVOID Animal products, ie. Meat - red/white, Poultry and Dairy/especially cheese - Exercise at least 5 times a week for 30 minutes or preferably daily.  - No Smoking - Drink less than 2 drinks a day.  - Monitor your feet for sores - Have yearly Eye Exams - Recommend annual Flu vaccine  - Recommend Pneumovax and Prevnar vaccines - Shingles Vaccine (Zostavax) if over 60 76o.  Goals:   - BMI less than 24 - Fasting sugar less than 130 or less than 150 if tapering medicines to lose weight  - Systolic BP less than 130063 Diastolic BP less than 80 - Bad LDL Cholesterol less than 70 - Triglycerides less than 150   Silent reflux: Not all heartburn burns......Marland KitchenMarland KitchenMarland Kitchenhat is LPR? Laryngopharyngeal reflux (LPR) or silent reflux is a condition in which acid that is made in the stomach travels up the esophagus (swallowing tube) and gets to the throat. Not everyone with reflux has a lot of heartburn or indigestion. In fact, many people with LPR never have heartburn. This is why LPR  is called SILENT REFLUX, and the terms "Silent reflux" and "LPR" are often used interchangeably. Because LPR is silent, it is sometimes difficult to diagnose.  How can you tell if you have LPR?  Marland Kitchen Chronic hoarseness- Some people have hoarseness that comes and goes . throat clearing  . Cough . It can cause shortness of breath and cause asthma like symptoms. Marland Kitchen a feeling of a lump in the throat  . difficulty swallowing . a problem with too much nose and throat drainage.  . Some people will feel their esophagus spasm which feels like their heart beating hard and fast, this will usually be after a meal, at rest, or lying down at night.    How do I treat this? Treatment for LPR should be individualized,  and your doctor will suggest the best treatment for you. Generally there are several treatments for LPR: . changing habits and diet to reduce reflux,  . medications to reduce stomach acid, and  . surgery to prevent reflux. Most people with LPR need to modify how and when they eat, as well as take some medication, to get well. Sometimes, nonprescription liquid antacids, such as Maalox, Gelucil and Mylanta are recommended. When used, these antacids should be taken four times each day - one tablespoon one hour after each meal and before bedtime. Dietary and lifestyle changes alone are not often enough to control LPR - medications that reduce stomach acid are also usually needed. These must be prescribed by our doctor.   TIPS FOR REDUCING REFLUX AND LPR Control your LIFE-STYLE and your DIET! Marland Kitchen If you use tobacco, QUIT.  Marland Kitchen Smoking makes you reflux. After every cigarette you have some LPR.  . Don't wear clothing that is too tight, especially around the waist (trousers, corsets, belts).  . Do not lie down just after eating...in fact, do not eat within three hours of bedtime.  . You should be on a low-fat diet.  . Limit your intake of red meat.  . Limit your intake of butter.  Marland Kitchen Avoid fried foods.  . Avoid chocolate  . Avoid cheese.  Marland Kitchen Avoid eggs. Marland Kitchen Specifically avoid caffeine (especially coffee and tea), soda pop (especially cola) and mints.  . Avoid alcoholic beverages, particularly in the evening.  INFORMATION ABOUT CBD OIL  The studies for CBD are better for seizure disorders than any other claims for CBD oil.  The biggest issues with it, is CBD oil is NOT regulated. A recent test of 18 over the counter CBD oil showed that 20% had TOO much, 60 % did not have the claimed amount, and 20 % had the claimed amount.   I have had some patients where it interacts with their medications as well and there is not a way for me to check the interactions since it is not a controlled substance.    With any other the counter medication OR supplement, if you try it, try to get from good source and if you have ANY abnormal symptoms over the next 3 months or don't see any benefits from it stop it.   The quality and quanity of the CBD oil varies greatly so I can not endorse patients taking it at this time.

## 2019-03-09 ENCOUNTER — Other Ambulatory Visit: Payer: Self-pay | Admitting: Internal Medicine

## 2019-03-09 ENCOUNTER — Other Ambulatory Visit: Payer: Self-pay | Admitting: Physician Assistant

## 2019-03-09 DIAGNOSIS — F419 Anxiety disorder, unspecified: Secondary | ICD-10-CM

## 2019-03-09 DIAGNOSIS — Z79899 Other long term (current) drug therapy: Secondary | ICD-10-CM

## 2019-03-09 DIAGNOSIS — E1151 Type 2 diabetes mellitus with diabetic peripheral angiopathy without gangrene: Secondary | ICD-10-CM

## 2019-03-09 LAB — LIPID PANEL
Cholesterol: 133 mg/dL (ref <200)
HDL: 42 mg/dL — ABNORMAL LOW (ref >=50)
LDL Cholesterol (Calc): 63 mg/dL (calc)
Non-HDL Cholesterol (Calc): 91 mg/dL (calc) (ref <130)
Total CHOL/HDL Ratio: 3.2 (calc) (ref <5.0)
Triglycerides: 215 mg/dL — ABNORMAL HIGH (ref <150)

## 2019-03-09 LAB — CBC WITH DIFFERENTIAL/PLATELET
Absolute Monocytes: 563 cells/uL (ref 200–950)
Basophils Absolute: 88 cells/uL (ref 0–200)
Basophils Relative: 1 %
Eosinophils Absolute: 458 cells/uL (ref 15–500)
Eosinophils Relative: 5.2 %
HCT: 40.9 % (ref 35.0–45.0)
Hemoglobin: 13.4 g/dL (ref 11.7–15.5)
Lymphs Abs: 2640 cells/uL (ref 850–3900)
MCH: 28.9 pg (ref 27.0–33.0)
MCHC: 32.8 g/dL (ref 32.0–36.0)
MCV: 88.3 fL (ref 80.0–100.0)
MPV: 10.2 fL (ref 7.5–12.5)
Monocytes Relative: 6.4 %
Neutro Abs: 5051 cells/uL (ref 1500–7800)
Neutrophils Relative %: 57.4 %
Platelets: 221 10*3/uL (ref 140–400)
RBC: 4.63 10*6/uL (ref 3.80–5.10)
RDW: 12.2 % (ref 11.0–15.0)
Total Lymphocyte: 30 %
WBC: 8.8 10*3/uL (ref 3.8–10.8)

## 2019-03-09 LAB — MICROALBUMIN / CREATININE URINE RATIO
Creatinine, Urine: 23 mg/dL (ref 20–275)
Microalb Creat Ratio: 565 mcg/mg creat — ABNORMAL HIGH (ref <30)
Microalb, Ur: 13 mg/dL

## 2019-03-09 LAB — URINALYSIS, ROUTINE W REFLEX MICROSCOPIC
Bilirubin Urine: NEGATIVE
Hgb urine dipstick: NEGATIVE
Hyaline Cast: NONE SEEN /LPF
Ketones, ur: NEGATIVE
Nitrite: NEGATIVE
Specific Gravity, Urine: 1.02 (ref 1.001–1.03)
Squamous Epithelial / HPF: NONE SEEN /HPF (ref <=5)
pH: <=5 (ref 5.0–8.0)

## 2019-03-09 LAB — HEMOGLOBIN A1C
Hgb A1c MFr Bld: >14 % of total Hgb — ABNORMAL HIGH (ref <5.7)
Mean Plasma Glucose: 355 (calc)
eAG (mmol/L): 19.7 (calc)

## 2019-03-09 LAB — COMPLETE METABOLIC PANEL WITH GFR
AG Ratio: 1.2 (calc) (ref 1.0–2.5)
ALT: 24 U/L (ref 6–29)
AST: 25 U/L (ref 10–35)
Albumin: 3.6 g/dL (ref 3.6–5.1)
Alkaline phosphatase (APISO): 111 U/L (ref 37–153)
BUN: 13 mg/dL (ref 7–25)
CO2: 29 mmol/L (ref 20–32)
Calcium: 9.4 mg/dL (ref 8.6–10.4)
Chloride: 93 mmol/L — ABNORMAL LOW (ref 98–110)
Creat: 0.69 mg/dL (ref 0.50–0.99)
GFR, Est African American: 104 mL/min/{1.73_m2} (ref >=60)
GFR, Est Non African American: 90 mL/min/{1.73_m2} (ref >=60)
Globulin: 3 g/dL (calc) (ref 1.9–3.7)
Glucose, Bld: 299 mg/dL — ABNORMAL HIGH (ref 65–99)
Potassium: 4.2 mmol/L (ref 3.5–5.3)
Sodium: 129 mmol/L — ABNORMAL LOW (ref 135–146)
Total Bilirubin: 0.4 mg/dL (ref 0.2–1.2)
Total Protein: 6.6 g/dL (ref 6.1–8.1)

## 2019-03-09 LAB — D-DIMER, QUANTITATIVE: D-Dimer, Quant: 0.42 mcg/mL FEU (ref <0.50)

## 2019-03-09 LAB — PROTIME-INR
INR: 0.9
Prothrombin Time: 9.9 s (ref 9.0–11.5)

## 2019-03-09 LAB — VITAMIN D 25 HYDROXY (VIT D DEFICIENCY, FRACTURES): Vit D, 25-Hydroxy: 21 ng/mL — ABNORMAL LOW (ref 30–100)

## 2019-03-09 LAB — MAGNESIUM: Magnesium: 1.7 mg/dL (ref 1.5–2.5)

## 2019-03-09 LAB — TSH: TSH: 0.88 mIU/L (ref 0.40–4.50)

## 2019-03-09 MED ORDER — GLIMEPIRIDE 4 MG PO TABS: ORAL_TABLET | ORAL | 1 refills | Status: DC

## 2019-03-09 MED ORDER — LISINOPRIL 10 MG PO TABS: 10.0000 mg | ORAL_TABLET | Freq: Every day | ORAL | 0 refills | Status: DC

## 2019-03-09 MED ORDER — CYCLOBENZAPRINE HCL 10 MG PO TABS: ORAL_TABLET | ORAL | 0 refills | Status: DC

## 2019-03-09 MED ORDER — FLUCONAZOLE 150 MG PO TABS: 150.0000 mg | ORAL_TABLET | Freq: Every day | ORAL | 3 refills | Status: DC

## 2019-03-09 NOTE — Telephone Encounter (Signed)
Can you please refill these for me. For some reason I can not sign.

## 2019-03-11 ENCOUNTER — Other Ambulatory Visit: Payer: Self-pay | Admitting: Physician Assistant

## 2019-04-06 NOTE — Progress Notes (Deleted)
Assessment and Plan:  There are no diagnoses linked to this encounter.    Further disposition pending results of labs. Discussed med's effects and SE's.   Over 30 minutes of exam, counseling, chart review, and critical decision making was performed.   Future Appointments  Date Time Provider [place]  04/10/2019  3:00 PM Liane Comber, NP [facility] None  06/19/2019  4:00 PM Unk Pinto, MD [facility] None  08/22/2019  2:00 PM Garnet Sierras, NP [facility] None  03/12/2020  3:00 PM Vicie Mutters, PA-C [facility] None    ------------------------------------------------------------------------------------------------------------------   HPI There were no vitals taken for this visit.  68 y.o.female smoker with hx of CVA, COPD, poorly controlled diabetes, NASH/cirrhosis, CAD (RCA 60%) presents for evaluation of frequent falls ***  She is very noncompliant with test, vaccines, and has an aversion to medication with " reactions" to the majority of medications.   She complains of falling x 3, no dizziness with it, felt shaky after fall but no CP, SOB, diaphoresis, LOSS of bowel or bladder. She has had some right leg swelling and right knee pain since her fall, hurt her lower back as well. No warmth, swelling.  CT chest 10/2017  She had normal brain MRI in 05/2015   Past Medical History:  Diagnosis Date  . Anxiety   . Anxiety disorder   . Cirrhosis ([place])   . COPD (chronic obstructive pulmonary disease) ([place])    followed by pcp--  last exacerbation 11/ 2017  . Coronary artery calcification seen on CT scan   . Depression    Patient denies  . Gastritis   . History of rib fracture    2004  . 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 Hyperlipidemia   . Hypertension   .  OA (osteoarthritis)    back, hips, knees, feet  . Osteoporosis   . Polyp of colon, hyperplastic   . Pulmonary nodules    per CT chest 08-28-2016  -- bilateral upper lobe nodules  . Stroke ([place])   . Tubular adenoma of colon   . Type 2 diabetes mellitus ([place])    followed by pcp--  last A1c 9.1 on 08-01-2016  . UTI (urinary tract infection)   . Wears dentures      Allergies  Allergen Reactions  . Actos [Pioglitazone] Other (See Comments)    Patient stated that it elevated her BGL  . Glyburide Other (See Comments)    Patient stated that it elevated her BGL  . Metformin And Related     "metformin caused mini-strokes" and ELEVATED BS and also GLYBURIDE, ONGLYZA, ACTOS, INVOKANA caused elevated BS  . Onglyza [Saxagliptin] Other (See Comments)    Patient stated that it elevated her BGL  . Insulins Rash  . Janumet [Sitagliptin-Metformin Hcl] Rash and Other (See Comments)    Shakes/ rash  . Penicillins Rash and Other (See Comments)    Current Outpatient Medications on File Prior to Visit  Medication Sig  . albuterol (PROVENTIL) (2.5 MG/3ML) 0.083% nebulizer solution Take 3 mLs (2.5 mg total) by nebulization every 6 (six) hours as needed for wheezing or shortness of breath.  . ALPRAZolam (XANAX) 1 MG tablet Take 1/2-1 tablet 2 - 3 x /day ONLY if needed for Panic  or Anxiety Attack &  limit to 5 days /week to avoid addiction  . aspirin EC 81 MG tablet Take 1 tablet (81 mg total) by mouth daily.  Marland Kitchen aspirin-acetaminophen-caffeine (EXCEDRIN MIGRAINE) 250-250-65 MG tablet Take 1-2 tablets by mouth as needed for headache or migraine.   . B Complex-C-Folic Acid (STRESS FORMULA PO) Take 1 tablet by mouth 2 (two) times daily as needed (STRESS).  . bisoprolol (ZEBETA) 5 MG tablet Take 0.5 tablets (2.5 mg total) by mouth daily. If blood pressure is greater than 150 , take a whole tablet that day.  . budesonide-formoterol (SYMBICORT) 160-4.5 MCG/ACT inhaler Inhale 2 puffs into the lungs 2 (two) times  daily. (Patient taking differently: Inhale 2 puffs into the lungs 2 (two) times daily as needed (for flares). )  . Cholecalciferol (VITAMIN D3) 1000 units CAPS Take 1,000 Units by mouth daily.   . colesevelam (WELCHOL) 625 MG tablet Take 625 mg by mouth every morning.   . cyclobenzaprine (FLEXERIL) 10 MG tablet TAKE ONE TABLET BY MOUTH AT BEDTIME AS NEEDED FOR muscle SPASMS  . diphenhydrAMINE HCl, Sleep, (SLEEP AID) 50 MG CAPS Take 50 mg by mouth at bedtime.   . Diphenhydramine-APAP (PERCOGESIC) 12.5-325 MG TABS Take 1 tablet by mouth 3 (three) times daily.   . famotidine (PEPCID) 20 MG tablet Take 1 tablet (20 mg total) by mouth 2 (two) times daily. (Patient taking differently: Take 20 mg by mouth daily. )  . fluconazole (DIFLUCAN) 150 MG tablet Take 1 tablet (150 mg total) by mouth daily.  Marland Kitchen gabapentin (NEURONTIN) 600 MG tablet TAKE 1/2 TO 1 TABLET BY MOUTH 2-3 times daily FOR diabetic neuropathy pain AND CHEST wall pain.  Marland Kitchen glimepiride (AMARYL) 4 MG tablet TAKE ONE TABLET BY MOUTH TWICE DAILY BEFORE LUNCH AND SUPPER  . Insulin Glargine (LANTUS SOLOSTAR) 100 UNIT/ML Solostar Pen Inject 16 Units into the skin at bedtime.  . Insulin Pen Needle (CAREFINE PEN NEEDLES) 32G X 4 MM MISC Use 1x a day  . isosorbide mononitrate (IMDUR) 60 MG 24 hr tablet Take 1 tablet (60 mg total) by mouth daily.  Marland Kitchen lisinopril (ZESTRIL) 10 MG tablet Take 1 tablet (10 mg total) by mouth daily. If systolic blood pressure less than 105,do not take that day.  . nitroGLYCERIN (NITROSTAT) 0.4 MG SL tablet Place 1 tablet (0.4 mg total) under the tongue every 5 (five) minutes as needed for chest pain.  . rosuvastatin (CRESTOR) 40 MG tablet Take 0.5 tablets (20 mg total) by mouth See admin instructions. Take 1/2 tab twice weekly for cholesterol. ONLY ON Monday AND Friday  . traZODone (DESYREL) 50 MG tablet 1/2-1 tablet for sleep   No current facility-administered medications on file prior to visit.     ROS: all negative except  above.   Physical Exam:  There were no vitals taken for this visit.  General Appearance: Well nourished, in no apparent distress. Eyes: PERRLA, EOMs, conjunctiva no swelling or erythema Sinuses: No Frontal/maxillary tenderness ENT/Mouth: Ext aud canals clear, TMs without erythema, bulging. No erythema, swelling, or exudate on post pharynx.  Tonsils not swollen or erythematous. Hearing normal.  Neck: Supple, thyroid normal.  Respiratory: Respiratory effort normal, BS equal bilaterally without rales, rhonchi, wheezing or stridor.  Cardio: RRR with no MRGs. Brisk peripheral pulses without edema.  Abdomen: Soft, + BS.  Non tender, no guarding, rebound, hernias, masses. Lymphatics: Non tender without lymphadenopathy.  Musculoskeletal: Full ROM, 5/5 strength, normal gait.  Skin: Warm, dry without rashes, lesions, ecchymosis.  Neuro: Cranial nerves intact. Normal muscle tone, no cerebellar symptoms. Sensation intact.  Psych: Awake and oriented X 3, normal affect, Insight and Judgment appropriate.     Izora Ribas, NP 6:03 PM [facility]

## 2019-04-10 ENCOUNTER — Ambulatory Visit: Payer: PPO | Admitting: Adult Health

## 2019-04-17 ENCOUNTER — Other Ambulatory Visit: Payer: Self-pay | Admitting: Cardiology

## 2019-04-17 ENCOUNTER — Other Ambulatory Visit: Payer: Self-pay | Admitting: Internal Medicine

## 2019-04-17 DIAGNOSIS — Z79899 Other long term (current) drug therapy: Secondary | ICD-10-CM

## 2019-04-17 DIAGNOSIS — F419 Anxiety disorder, unspecified: Secondary | ICD-10-CM

## 2019-06-06 ENCOUNTER — Other Ambulatory Visit: Payer: Self-pay | Admitting: Internal Medicine

## 2019-06-06 DIAGNOSIS — Z79899 Other long term (current) drug therapy: Secondary | ICD-10-CM

## 2019-06-06 DIAGNOSIS — F419 Anxiety disorder, unspecified: Secondary | ICD-10-CM

## 2019-06-18 ENCOUNTER — Encounter: Payer: Self-pay | Admitting: Internal Medicine

## 2019-06-18 NOTE — Progress Notes (Signed)
History of Present Illness:      This very nice 68 y.o. MWF  presents for 3 month follow up with HTN, HLD, Pre-Diabetes and Vitamin D Deficiency.       Patient is treated for HTN & BP has been controlled at home. Today's BP sit is 152 /78 and BP stand 70/44. Patient does admit occasional postural dizziness with standing. Patient has had no complaints of any cardiac type chest pain, palpitations, dyspnea / orthopnea / PND,claudication, or dependent edema.      Hyperlipidemia is controlled with diet & meds. Patient denies myalgias or other med SE's. Last Lipids were at goal:  Lab Results  Component Value Date   CHOL 133 03/08/2019   HDL 42 (L) 03/08/2019   LDLCALC 63 03/08/2019   TRIG 215 (H) 03/08/2019   CHOLHDL 3.2 03/08/2019        Also, the patient has history of T2_NIDDM PreDiabetes and has had no symptoms of reactive hypoglycemia, diabetic polys, paresthesias or visual blurring.  Last A1c was not at goal:  Lab Results  Component Value Date   HGBA1C >14.0 (H) 03/08/2019        Further, the patient also has history of Vitamin D Deficiency and supplements vitamin D without any suspected side-effects. Last vitamin D was still low:  Lab Results  Component Value Date   VD25OH 21 (L) 03/08/2019    Current Outpatient Medications on File Prior to Visit  Medication Sig  . albuterol (PROVENTIL) (2.5 MG/3ML) 0.083% nebulizer solution Take 3 mLs (2.5 mg total) by nebulization every 6 (six) hours as needed for wheezing or shortness of breath.  . ALPRAZolam (XANAX) 1 MG tablet Take 1/2-1 tablet 2 - 3 x /day ONLY if needed for Anxiety Attack &  limit to 5 days /week to avoid addiction  . aspirin EC 81 MG tablet Take 1 tablet (81 mg total) by mouth daily.  Marland Kitchen aspirin-acetaminophen-caffeine (EXCEDRIN MIGRAINE) 250-250-65 MG tablet Take 1-2 tablets by mouth as needed for headache or migraine.   . bisoprolol (ZEBETA) 5 MG tablet TAKE 1/2 TABLET BY MOUTH ONCE DAILY  .  budesonide-formoterol (SYMBICORT) 160-4.5 MCG/ACT inhaler Inhale 2 puffs into the lungs 2 (two) times daily. (Patient taking differently: Inhale 2 puffs into the lungs 2 (two) times daily as needed (for flares). )  . Cholecalciferol (VITAMIN D3) 1000 units CAPS Take 1,000 Units by mouth daily.   . colesevelam (WELCHOL) 625 MG tablet Take 625 mg by mouth every morning.   . cyclobenzaprine (FLEXERIL) 10 MG tablet TAKE ONE TABLET BY MOUTH AT BEDTIME AS NEEDED FOR muscle SPASMS  . diphenhydrAMINE HCl, Sleep, (SLEEP AID) 50 MG CAPS Take 50 mg by mouth at bedtime.   . famotidine (PEPCID) 20 MG tablet Take 1 tablet (20 mg total) by mouth 2 (two) times daily. (Patient taking differently: Take 20 mg by mouth daily. )  . fluconazole (DIFLUCAN) 150 MG tablet Take 1 tablet (150 mg total) by mouth daily.  Marland Kitchen gabapentin (NEURONTIN) 600 MG tablet TAKE 1/2 TO 1 TABLET BY MOUTH 2-3 times daily FOR diabetic neuropathy pain AND CHEST wall pain.  Marland Kitchen glimepiride (AMARYL) 4 MG tablet TAKE ONE TABLET BY MOUTH TWICE DAILY BEFORE LUNCH AND SUPPER  . Insulin Glargine (LANTUS SOLOSTAR) 100 UNIT/ML Solostar Pen Inject 16 Units into the skin at bedtime.  . Insulin Pen Needle (CAREFINE PEN NEEDLES) 32G X 4 MM MISC Use 1x a day  . lisinopril (ZESTRIL) 10 MG tablet Take  1 tablet (10 mg total) by mouth daily. If systolic blood pressure less than 105,do not take that day.  . nitroGLYCERIN (NITROSTAT) 0.4 MG SL tablet Place 1 tablet (0.4 mg total) under the tongue every 5 (five) minutes as needed for chest pain.  . rosuvastatin (CRESTOR) 40 MG tablet Take 0.5 tablets (20 mg total) by mouth See admin instructions. Take 1/2 tab twice weekly for cholesterol. ONLY ON Monday AND Friday (Patient taking differently: Take 20 mg by mouth See admin instructions. Take 1/2 tab three times weekly for cholesterol. ONLY ON Monday, Wednesday AND Friday)  . traZODone (DESYREL) 50 MG tablet 1/2-1 tablet for sleep  . isosorbide mononitrate (IMDUR) 60 MG 24  hr tablet Take 1 tablet (60 mg total) by mouth daily.   No current facility-administered medications on file prior to visit.     Allergies  Allergen Reactions  . Actos [Pioglitazone] Other (See Comments)    Patient stated that it elevated her BGL  . Glyburide Other (See Comments)    Patient stated that it elevated her BGL  . Metformin And Related     "metformin caused mini-strokes" and ELEVATED BS and also GLYBURIDE, ONGLYZA, ACTOS, INVOKANA caused elevated BS  . Onglyza [Saxagliptin] Other (See Comments)    Patient stated that it elevated her BGL  . Insulins Rash  . Janumet [Sitagliptin-Metformin Hcl] Rash and Other (See Comments)    Shakes/ rash  . Penicillins Rash and Other (See Comments)    PMHx:   Past Medical History:  Diagnosis Date  . Anxiety   . Anxiety disorder   . Cirrhosis ([place])   . COPD (chronic obstructive pulmonary disease) ([place])    followed by pcp--  last exacerbation 11/ 2017  . Coronary artery calcification seen on CT scan   . Depression    Patient denies  . Gastritis   . History of rib fracture    2004  . 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 Hyperlipidemia   . Hypertension   . OA (osteoarthritis)    back, hips, knees, feet  . Osteoporosis   . Polyp of colon, hyperplastic   . Pulmonary nodules    per CT chest 08-28-2016  -- bilateral upper lobe nodules  . Stroke ([place])   . Tubular adenoma of colon   . Type 2 diabetes mellitus ([place])    followed by pcp--  last A1c 9.1 on 08-01-2016  . UTI (urinary tract infection)   . Wears dentures    Immunization History  Administered Date(s) Administered  . Hep A / Hep B 01/07/2017, 02/08/2017, 07/09/2017  . Td 06/26/2001  . Tdap 04/30/2015   Past Surgical History:  Procedure Laterality Date  . BILATERAL  SALPINGOOPHORECTOMY  1997   via Laparoscopy  . CHOLECYSTECTOMY N/A 09/30/2016   Procedure: LAPAROSCOPIC CHOLECYSTECTOMY;  Surgeon: Mickeal Skinner, MD;  Location: [facility];  Service: General;  Laterality: N/A;  . INTRAVASCULAR PRESSURE WIRE/FFR STUDY N/A 04/12/2018   Procedure: INTRAVASCULAR PRESSURE WIRE/FFR STUDY;  Surgeon: Leonie Man, MD;  Location: [place] [facility];  Service: Cardiovascular;  Laterality: N/A;  . KNEE ARTHROSCOPY Right 11/2014  . LEFT HEART CATH AND CORONARY ANGIOGRAPHY N/A 04/12/2018   Procedure: LEFT HEART CATH AND CORONARY  ANGIOGRAPHY;  Surgeon: Leonie Man, MD;  Location: [place] [facility];  Service: Cardiovascular;  Laterality: N/A;  . TRANSTHORACIC ECHOCARDIOGRAM  02/2018    Normal LV function.  EF 60 to 65%.  No RWMMA.  GRII DD.  Moderate LA dilation.  Marland Kitchen VAGINAL HYSTERECTOMY  age 28    FHx:    Reviewed / unchanged  SHx:    Reviewed / unchanged   Systems Review:  Constitutional: Denies fever, chills, wt changes, headaches, insomnia, fatigue, night sweats, change in appetite. Eyes: Denies redness, blurred vision, diplopia, discharge, itchy, watery eyes.  ENT: Denies discharge, congestion, post nasal drip, epistaxis, sore throat, earache, hearing loss, dental pain, tinnitus, vertigo, sinus pain, snoring.  CV: Denies chest pain, palpitations, irregular heartbeat, syncope, dyspnea, diaphoresis, orthopnea, PND, claudication or edema. Respiratory: denies cough, dyspnea, DOE, pleurisy, hoarseness, laryngitis, wheezing.  Gastrointestinal: Denies dysphagia, odynophagia, heartburn, reflux, water brash, abdominal pain or cramps, nausea, vomiting, bloating, diarrhea, constipation, hematemesis, melena, hematochezia  or hemorrhoids. Genitourinary: Denies dysuria, frequency, urgency, nocturia, hesitancy, discharge, hematuria or flank pain. Musculoskeletal: Denies arthralgias, myalgias, stiffness, jt. swelling, pain, limping or strain/sprain.   Skin: Denies pruritus, rash, hives, warts, acne, eczema or change in skin lesion(s). Neuro: No weakness, tremor, incoordination, spasms, paresthesia or pain. Psychiatric: Denies confusion, memory loss or sensory loss. Endo: Denies change in weight, skin or hair change.  Heme/Lymph: No excessive bleeding, bruising or enlarged lymph nodes.  Physical Exam  BP  sit - 152 /78  &  BP stand - 70/44  P 72   T 97.8 F    R 16   Ht 4' 10"    Wt 137 lb   BMI 28.63  Appears  well nourished, well groomed  and in no distress.  Eyes: PERRLA, EOMs, conjunctiva no swelling or erythema. Sinuses: No frontal/maxillary tenderness ENT/Mouth: EAC's clear, TM's nl w/o erythema, bulging. Nares clear w/o erythema, swelling, exudates. Oropharynx clear without erythema or exudates. Oral hygiene is good. Tongue normal, non obstructing. Hearing intact.  Neck: Supple. Thyroid not palpable. Car 2+/2+ without bruits, nodes or JVD. Chest: Respirations nl with BS clear & equal w/o rales, rhonchi, wheezing or stridor.  Cor: Heart sounds normal w/ regular rate and rhythm without sig. murmurs, gallops, clicks or rubs. Peripheral pulses normal and equal  without edema.  Abdomen: Soft & bowel sounds normal. Non-tender w/o guarding, rebound, hernias, masses or organomegaly.  Lymphatics: Unremarkable.  Musculoskeletal: Full ROM all peripheral extremities, joint stability, 5/5 strength and normal gait.  Skin: Warm, dry without exposed rashes, lesions or ecchymosis apparent.  Neuro: Cranial nerves intact, reflexes equal bilaterally. Sensory-motor testing grossly intact. Tendon reflexes grossly intact.  Pysch: Alert & oriented x 3.  Insight and judgement nl & appropriate. No ideations.  Assessment and Plan:  1. Essential hypertension  - advised to stop her Zebeta  5 mg x 1/2 tab for now & aso to leave off her Imdur for now& cut her Lisinopril 10 mg dose in 1/2 also   - monitor postural sitting & standing blood pressure s   - Continue DASH diet.  Reminder to go to the ER if any CP,  SOB, nausea, dizziness, severe HA, changes vision/speech.  - CBC with Diff - COMPLETE METABOLIC PANEL WITH GFR - Magnesium - TSH  2. Postural hypotension   3. Hyperlipidemia associated with type 2 diabetes mellitus ([place])  - Continue diet/meds, exercise,& lifestyle modifications.  - Continue monitor periodic cholesterol/liver & renal functions   - Lipid Profile - TSH  4. T2_NIDDM, poorly controlled  - Continue diet, exercise  - Lifestyle modifications.  - Monitor appropriate labs  5. Insulin-requiring or dependent type II diabetes mellitus ([place])  - Hemoglobin A1c ([facility])  6. Vitamin D deficiency  - Continue supplementation.  - Vitamin D (25 hydroxy)  7. Gout  - Uric acid  8. Poor compliance with medication   9. Medication management  - CBC with Diff - COMPLETE METABOLIC PANEL WITH GFR - Magnesium - Lipid Profile - TSH - Hemoglobin A1c ([facility]) - Vitamin D (25 hydroxy) - Uric acid       Discussed  regular exercise, BP monitoring, weight control to achieve/maintain BMI less than 25 and discussed med and SE's. Recommended labs to assess and monitor clinical status with further disposition pending results of labs.  I discussed the assessment and treatment plan with the patient. The patient was provided an opportunity to ask questions and all were answered. The patient agreed with the plan and demonstrated an understanding of the instructions.  I provided over 30 minutes of exam, counseling, chart review and  complex critical decision making.  Kirtland Bouchard, MD

## 2019-06-18 NOTE — Patient Instructions (Addendum)

## 2019-06-19 ENCOUNTER — Other Ambulatory Visit: Payer: Self-pay

## 2019-06-19 ENCOUNTER — Ambulatory Visit (INDEPENDENT_AMBULATORY_CARE_PROVIDER_SITE_OTHER): Payer: PPO | Admitting: Internal Medicine

## 2019-06-19 VITALS — BP 152/78 | HR 72 | Temp 97.8°F | Resp 16 | Ht <= 58 in | Wt 137.0 lb

## 2019-06-19 DIAGNOSIS — E1165 Type 2 diabetes mellitus with hyperglycemia: Secondary | ICD-10-CM

## 2019-06-19 DIAGNOSIS — E119 Type 2 diabetes mellitus without complications: Secondary | ICD-10-CM | POA: Diagnosis not present

## 2019-06-19 DIAGNOSIS — E1169 Type 2 diabetes mellitus with other specified complication: Secondary | ICD-10-CM | POA: Diagnosis not present

## 2019-06-19 DIAGNOSIS — E559 Vitamin D deficiency, unspecified: Secondary | ICD-10-CM

## 2019-06-19 DIAGNOSIS — Z794 Long term (current) use of insulin: Secondary | ICD-10-CM | POA: Diagnosis not present

## 2019-06-19 DIAGNOSIS — I951 Orthostatic hypotension: Secondary | ICD-10-CM | POA: Diagnosis not present

## 2019-06-19 DIAGNOSIS — Z79899 Other long term (current) drug therapy: Secondary | ICD-10-CM | POA: Diagnosis not present

## 2019-06-19 DIAGNOSIS — E785 Hyperlipidemia, unspecified: Secondary | ICD-10-CM | POA: Diagnosis not present

## 2019-06-19 DIAGNOSIS — I10 Essential (primary) hypertension: Secondary | ICD-10-CM

## 2019-06-19 DIAGNOSIS — M109 Gout, unspecified: Secondary | ICD-10-CM

## 2019-06-19 DIAGNOSIS — Z9114 Patient's other noncompliance with medication regimen: Secondary | ICD-10-CM

## 2019-06-20 LAB — COMPLETE METABOLIC PANEL WITH GFR
AG Ratio: 1.2 (calc) (ref 1.0–2.5)
ALT: 18 U/L (ref 6–29)
AST: 26 U/L (ref 10–35)
Albumin: 3.9 g/dL (ref 3.6–5.1)
Alkaline phosphatase (APISO): 110 U/L (ref 37–153)
BUN: 10 mg/dL (ref 7–25)
CO2: 27 mmol/L (ref 20–32)
Calcium: 9.6 mg/dL (ref 8.6–10.4)
Chloride: 95 mmol/L — ABNORMAL LOW (ref 98–110)
Creat: 0.69 mg/dL (ref 0.50–0.99)
GFR, Est African American: 104 mL/min/{1.73_m2} (ref >=60)
GFR, Est Non African American: 89 mL/min/{1.73_m2} (ref >=60)
Globulin: 3.3 g/dL (calc) (ref 1.9–3.7)
Glucose, Bld: 243 mg/dL — ABNORMAL HIGH (ref 65–99)
Potassium: 4.7 mmol/L (ref 3.5–5.3)
Sodium: 132 mmol/L — ABNORMAL LOW (ref 135–146)
Total Bilirubin: 0.3 mg/dL (ref 0.2–1.2)
Total Protein: 7.2 g/dL (ref 6.1–8.1)

## 2019-06-20 LAB — CBC WITH DIFFERENTIAL/PLATELET
Absolute Monocytes: 478 cells/uL (ref 200–950)
Basophils Absolute: 113 cells/uL (ref 0–200)
Basophils Relative: 1.4 %
Eosinophils Absolute: 502 cells/uL — ABNORMAL HIGH (ref 15–500)
Eosinophils Relative: 6.2 %
HCT: 43 % (ref 35.0–45.0)
Hemoglobin: 14.5 g/dL (ref 11.7–15.5)
Lymphs Abs: 2357 cells/uL (ref 850–3900)
MCH: 29.4 pg (ref 27.0–33.0)
MCHC: 33.7 g/dL (ref 32.0–36.0)
MCV: 87.2 fL (ref 80.0–100.0)
MPV: 10.6 fL (ref 7.5–12.5)
Monocytes Relative: 5.9 %
Neutro Abs: 4649 cells/uL (ref 1500–7800)
Neutrophils Relative %: 57.4 %
Platelets: 206 10*3/uL (ref 140–400)
RBC: 4.93 10*6/uL (ref 3.80–5.10)
RDW: 12 % (ref 11.0–15.0)
Total Lymphocyte: 29.1 %
WBC: 8.1 10*3/uL (ref 3.8–10.8)

## 2019-06-20 LAB — HEMOGLOBIN A1C
Hgb A1c MFr Bld: 12.9 % of total Hgb — ABNORMAL HIGH (ref <5.7)
Mean Plasma Glucose: 324 (calc)
eAG (mmol/L): 17.9 (calc)

## 2019-06-20 LAB — LIPID PANEL
Cholesterol: 141 mg/dL (ref <200)
HDL: 41 mg/dL — ABNORMAL LOW (ref >=50)
LDL Cholesterol (Calc): 69 mg/dL (calc)
Non-HDL Cholesterol (Calc): 100 mg/dL (calc) (ref <130)
Total CHOL/HDL Ratio: 3.4 (calc) (ref <5.0)
Triglycerides: 217 mg/dL — ABNORMAL HIGH (ref <150)

## 2019-06-20 LAB — VITAMIN D 25 HYDROXY (VIT D DEFICIENCY, FRACTURES): Vit D, 25-Hydroxy: 11 ng/mL — ABNORMAL LOW (ref 30–100)

## 2019-06-20 LAB — TSH: TSH: 0.82 mIU/L (ref 0.40–4.50)

## 2019-06-20 LAB — URIC ACID: Uric Acid, Serum: 3.1 mg/dL (ref 2.5–7.0)

## 2019-06-20 LAB — MAGNESIUM: Magnesium: 1.7 mg/dL (ref 1.5–2.5)

## 2019-07-04 ENCOUNTER — Ambulatory Visit: Payer: PPO | Admitting: Internal Medicine

## 2019-07-18 ENCOUNTER — Other Ambulatory Visit: Payer: Self-pay | Admitting: Internal Medicine

## 2019-07-18 DIAGNOSIS — F419 Anxiety disorder, unspecified: Secondary | ICD-10-CM

## 2019-07-18 DIAGNOSIS — Z79899 Other long term (current) drug therapy: Secondary | ICD-10-CM

## 2019-07-26 ENCOUNTER — Ambulatory Visit (INDEPENDENT_AMBULATORY_CARE_PROVIDER_SITE_OTHER): Payer: PPO | Admitting: Adult Health

## 2019-07-26 ENCOUNTER — Other Ambulatory Visit: Payer: Self-pay

## 2019-07-26 ENCOUNTER — Encounter: Payer: Self-pay | Admitting: Adult Health

## 2019-07-26 VITALS — BP 100/58 | HR 95 | Temp 96.1°F | Wt 136.0 lb

## 2019-07-26 DIAGNOSIS — Z79899 Other long term (current) drug therapy: Secondary | ICD-10-CM

## 2019-07-26 DIAGNOSIS — I700 Atherosclerosis of aorta: Secondary | ICD-10-CM | POA: Diagnosis not present

## 2019-07-26 DIAGNOSIS — R296 Repeated falls: Secondary | ICD-10-CM

## 2019-07-26 DIAGNOSIS — I951 Orthostatic hypotension: Secondary | ICD-10-CM

## 2019-07-26 DIAGNOSIS — E1151 Type 2 diabetes mellitus with diabetic peripheral angiopathy without gangrene: Secondary | ICD-10-CM

## 2019-07-26 DIAGNOSIS — Z9181 History of falling: Secondary | ICD-10-CM | POA: Diagnosis not present

## 2019-07-26 DIAGNOSIS — I10 Essential (primary) hypertension: Secondary | ICD-10-CM | POA: Diagnosis not present

## 2019-07-26 MED ORDER — LISINOPRIL 5 MG PO TABS: 2.5000 mg | ORAL_TABLET | Freq: Every day | ORAL | 1 refills | Status: DC

## 2019-07-26 MED ORDER — LISINOPRIL 5 MG PO TABS: 5.0000 mg | ORAL_TABLET | Freq: Every day | ORAL | 1 refills | Status: DC

## 2019-07-26 MED ORDER — LANTUS SOLOSTAR 100 UNIT/ML ~~LOC~~ SOPN: 16.0000 [IU] | PEN_INJECTOR | Freq: Every day | SUBCUTANEOUS | 11 refills | Status: DC

## 2019-07-26 NOTE — Progress Notes (Signed)
Assessment and Plan:  Sara Lamb was seen today for follow-up.  Diagnoses and all orders for this visit:  Essential hypertension/ Postural hypotension Continue bisoprolol 2.5 mg, reduce lisinopril 2.5 mg; HOLD isosorbide Start taking BP medications in PM Reviewed slow position changes; recommended bottle of water in AM 30 min prior to getting out of bed Monitor BPs closely at home with sitting and standing Close follow up in 1 month Consider holding bisoprolol and addition of florinef if severe positional hypotension is persistent per discussion with Dr. Melford Aase  -     lisinopril (ZESTRIL) 5 MG tablet; Take 1/2 tablet (2.5 mg total) by mouth daily. For blood pressure in the evening. -     Orthostatic vital signs  Frequent falls/At high risk for injury related to fall Med adjustment per above; reviewed preventing falls in the home   Recommended bottle of water in AM 30 min prior to getting up from bed Slow position changes; 30 sec sitting, then slow standing using walker, 30-60 sec prior to ambulation; recommended consistent walker use until dizziness and frequent falls improves Consider addition of florinef if persistent postural hypotension despite med adjustment  Patient states will call insurance to inquire if any benefits that may provide a ramp for her front steps; I will have my coordinator reach out to her if she is able to recommend local resources for this   Further disposition pending results of labs. Discussed med's effects and SE's.   Over 30 minutes of exam, counseling, chart review, and critical decision making was performed.   Future Appointments  Date Time Provider [place]  09/27/2019 11:30 AM Unk Pinto, MD [facility] None  03/12/2020  3:00 PM Vicie Mutters, PA-C [facility] None    ------------------------------------------------------------------------------------------------------------------   HPI BP (!) 100/58   Pulse 95   Temp (!) 96.1 F (35.6  C)   Wt 136 lb (61.7 kg)   SpO2 99%   BMI 28.42 kg/m   68 y.o.female with hx of non-occlusive CAD (RCA 60%, FFR neg per Dr. Ellyn Hack 12/2018), poorly controlled T2DM, htn, tobacco use presents for 1 month follow up after hypertensive medication adjustments after notable postural hypotension noted at last visit with Dr. Melford Aase.   She was advised to decrease bisoprolol from 5 mg to 2.5 mg, reduce lisinopril from 10 mg to 5 mg and hold imdur 60 mg. CBC, CMP/GFR, magnesium were unremarkable other than mild hyponatremia (132) consistent with her baseline.   She reports has reduced lisinopril to 5 mg, bisoprolol 5 mg, taking in AM, but has continued to take 1/2 tab of imdur. She admits she has not been checking BPs at home though she does have a BP cuff. She reports she has experienced some CP since medication adjustment though not notably with exertion, not accompanied by dyspnea or dizziness. She is somewhat of a poor historical and struggles to characterize further.   She reports ongoing dizziness with position changes, endorses 1 fall in the past month - fell after she got out bed and ended up on the floor. She reports is doing slow position changes, uses walker as getting out bed. She reports frequent falls on front steps (3 steps) and wondering about possible ramp for this area.   Orthostatics - Supine 142/78, P 97, Sitting 110/54, P 98, Standing 96/58, P 95   Past Medical History:  Diagnosis Date  . Anxiety   . Anxiety disorder   . Cirrhosis ([place])   . COPD (chronic obstructive pulmonary disease) ([place])  followed by pcp--  last exacerbation 11/ 2017  . Coronary artery calcification seen on CT scan   . Depression    Patient denies  . Gastritis   . History of rib fracture    2004  . History of TIAs    x5-- last one 2017--  "gets real sharp stabbing pain over eye and speech screws up"  pt states takes excederin migraine and lies down (pt stated was told if she "if come to ER again they  would send her to psych ward")  . Hyperlipidemia   . Hypertension   . OA (osteoarthritis)    back, hips, knees, feet  . Osteoporosis   . Polyp of colon, hyperplastic   . Pulmonary nodules    per CT chest 08-28-2016  -- bilateral upper lobe nodules  . Stroke ([place])   . Tubular adenoma of colon   . Type 2 diabetes mellitus ([place])    followed by pcp--  last A1c 9.1 on 08-01-2016  . UTI (urinary tract infection)   . Wears dentures      Allergies  Allergen Reactions  . Actos [Pioglitazone] Other (See Comments)    Patient stated that it elevated her BGL  . Glyburide Other (See Comments)    Patient stated that it elevated her BGL  . Metformin And Related     "metformin caused mini-strokes" and ELEVATED BS and also GLYBURIDE, ONGLYZA, ACTOS, INVOKANA caused elevated BS  . Onglyza [Saxagliptin] Other (See Comments)    Patient stated that it elevated her BGL  . Insulins Rash  . Janumet [Sitagliptin-Metformin Hcl] Rash and Other (See Comments)    Shakes/ rash  . Penicillins Rash and Other (See Comments)    Current Outpatient Medications on File Prior to Visit  Medication Sig  . albuterol (PROVENTIL) (2.5 MG/3ML) 0.083% nebulizer solution Take 3 mLs (2.5 mg total) by nebulization every 6 (six) hours as needed for wheezing or shortness of breath.  . ALPRAZolam (XANAX) 1 MG tablet Take 1/2-1 tablet 2 - 3 x /day ONLY if needed for Anxiety Attack & limit to 5 days /week to avoid addiction  . aspirin EC 81 MG tablet Take 1 tablet (81 mg total) by mouth daily.  Marland Kitchen aspirin-acetaminophen-caffeine (EXCEDRIN MIGRAINE) 250-250-65 MG tablet Take 1-2 tablets by mouth as needed for headache or migraine.   . bisoprolol (ZEBETA) 5 MG tablet TAKE 1/2 TABLET BY MOUTH ONCE DAILY  . budesonide-formoterol (SYMBICORT) 160-4.5 MCG/ACT inhaler Inhale 2 puffs into the lungs 2 (two) times daily. (Patient taking differently: Inhale 2 puffs into the lungs 2 (two) times daily as needed (for flares). )  .  Cholecalciferol (VITAMIN D3) 1000 units CAPS Take 1,000 Units by mouth daily.   . colesevelam (WELCHOL) 625 MG tablet Take 625 mg by mouth every morning.   . cyclobenzaprine (FLEXERIL) 10 MG tablet TAKE ONE TABLET BY MOUTH AT BEDTIME AS NEEDED FOR muscle SPASMS  . diphenhydrAMINE HCl, Sleep, (SLEEP AID) 50 MG CAPS Take 50 mg by mouth at bedtime.   . famotidine (PEPCID) 20 MG tablet Take 1 tablet (20 mg total) by mouth 2 (two) times daily. (Patient taking differently: Take 20 mg by mouth daily. )  . fluconazole (DIFLUCAN) 150 MG tablet Take 1 tablet (150 mg total) by mouth daily.  Marland Kitchen gabapentin (NEURONTIN) 600 MG tablet TAKE 1/2 TO 1 TABLET BY MOUTH 2-3 times daily FOR diabetic neuropathy pain AND CHEST wall pain.  Marland Kitchen glimepiride (AMARYL) 4 MG tablet TAKE ONE TABLET BY MOUTH TWICE  DAILY BEFORE LUNCH AND SUPPER  . Insulin Pen Needle (CAREFINE PEN NEEDLES) 32G X 4 MM MISC Use 1x a day  . nitroGLYCERIN (NITROSTAT) 0.4 MG SL tablet Place 1 tablet (0.4 mg total) under the tongue every 5 (five) minutes as needed for chest pain.  . rosuvastatin (CRESTOR) 40 MG tablet Take 0.5 tablets (20 mg total) by mouth See admin instructions. Take 1/2 tab twice weekly for cholesterol. ONLY ON Monday AND Friday (Patient taking differently: Take 20 mg by mouth See admin instructions. Take 1/2 tab three times weekly for cholesterol. ONLY ON Monday, Wednesday AND Friday)  . traZODone (DESYREL) 50 MG tablet 1/2-1 tablet for sleep  . isosorbide mononitrate (IMDUR) 60 MG 24 hr tablet Take 1 tablet (60 mg total) by mouth daily.   No current facility-administered medications on file prior to visit.    ROS: all negative except above.   Physical Exam:  BP (!) 100/58   Pulse 95   Temp (!) 96.1 F (35.6 C)   Wt 136 lb (61.7 kg)   SpO2 99%   BMI 28.42 kg/m   General Appearance: Well nourished, in no apparent distress. Eyes: PERRLA, conjunctiva no swelling or erythema ENT/Mouth: Ext aud canals clear, TMs without erythema,  bulging. Mask in place; oral exam deferred. Hearing normal.  Neck: Supple, thyroid normal.  Respiratory: Respiratory effort normal, BS equal bilaterally without rales, rhonchi, wheezing or stridor.  Cardio: RRR with no MRGs. Intact peripheral pulses without edema.  Abdomen: Soft, + BS.  Non tender, no guarding, rebound, hernias, masses. Lymphatics: Non tender without lymphadenopathy.  Musculoskeletal: No obvious deformity, slow steady gait after initial instability with sitting to standing.   Skin: Warm, dry without rashes, lesions, ecchymosis.  Neuro: Cranial nerves intact. Normal muscle tone, no cerebellar symptoms. Sensation intact.  Psych: Awake and oriented X 3, normal affect, Insight and Judgment appropriate.     Izora Ribas, NP 3:57 PM [facility]

## 2019-07-26 NOTE — Patient Instructions (Addendum)
    1/2 tab of new lisinopril 5 mg - 2.5 mg - take in the evening  Take 1/2 tab bisoprolol at night  STOP imdur - watch chest pain - if getting bad restart 1/2 tab only - take in the evening   Drink a full bottle of water in bed in the morning 30 before getting out of bed        Stop percogesic  Get extra strength tylenol - can take 2 tabs three times a day as needed  Try salonpas lidocaine patches   Can try voltaren gel

## 2019-07-28 HISTORY — PX: ORIF HIP FRACTURE: SHX2125

## 2019-07-31 ENCOUNTER — Other Ambulatory Visit: Payer: Self-pay | Admitting: Adult Health

## 2019-07-31 DIAGNOSIS — J449 Chronic obstructive pulmonary disease, unspecified: Secondary | ICD-10-CM

## 2019-07-31 DIAGNOSIS — R296 Repeated falls: Secondary | ICD-10-CM

## 2019-07-31 DIAGNOSIS — Z9181 History of falling: Secondary | ICD-10-CM

## 2019-07-31 DIAGNOSIS — F09 Unspecified mental disorder due to known physiological condition: Secondary | ICD-10-CM

## 2019-08-07 ENCOUNTER — Other Ambulatory Visit: Payer: Self-pay | Admitting: Physician Assistant

## 2019-08-19 DIAGNOSIS — G8929 Other chronic pain: Secondary | ICD-10-CM | POA: Diagnosis not present

## 2019-08-19 DIAGNOSIS — Z7982 Long term (current) use of aspirin: Secondary | ICD-10-CM | POA: Diagnosis not present

## 2019-08-19 DIAGNOSIS — W19XXXA Unspecified fall, initial encounter: Secondary | ICD-10-CM | POA: Diagnosis not present

## 2019-08-19 DIAGNOSIS — M1990 Unspecified osteoarthritis, unspecified site: Secondary | ICD-10-CM | POA: Diagnosis not present

## 2019-08-19 DIAGNOSIS — D62 Acute posthemorrhagic anemia: Secondary | ICD-10-CM | POA: Diagnosis not present

## 2019-08-19 DIAGNOSIS — D6489 Other specified anemias: Secondary | ICD-10-CM | POA: Diagnosis not present

## 2019-08-19 DIAGNOSIS — Z4789 Encounter for other orthopedic aftercare: Secondary | ICD-10-CM | POA: Diagnosis not present

## 2019-08-19 DIAGNOSIS — M2550 Pain in unspecified joint: Secondary | ICD-10-CM | POA: Diagnosis not present

## 2019-08-19 DIAGNOSIS — Z794 Long term (current) use of insulin: Secondary | ICD-10-CM | POA: Diagnosis not present

## 2019-08-19 DIAGNOSIS — E7800 Pure hypercholesterolemia, unspecified: Secondary | ICD-10-CM | POA: Diagnosis not present

## 2019-08-19 DIAGNOSIS — R0902 Hypoxemia: Secondary | ICD-10-CM | POA: Diagnosis not present

## 2019-08-19 DIAGNOSIS — S72141A Displaced intertrochanteric fracture of right femur, initial encounter for closed fracture: Secondary | ICD-10-CM | POA: Diagnosis not present

## 2019-08-19 DIAGNOSIS — I10 Essential (primary) hypertension: Secondary | ICD-10-CM | POA: Diagnosis not present

## 2019-08-19 DIAGNOSIS — F17210 Nicotine dependence, cigarettes, uncomplicated: Secondary | ICD-10-CM | POA: Diagnosis not present

## 2019-08-19 DIAGNOSIS — W19XXXD Unspecified fall, subsequent encounter: Secondary | ICD-10-CM | POA: Diagnosis not present

## 2019-08-19 DIAGNOSIS — S72001A Fracture of unspecified part of neck of right femur, initial encounter for closed fracture: Secondary | ICD-10-CM | POA: Diagnosis not present

## 2019-08-19 DIAGNOSIS — M25561 Pain in right knee: Secondary | ICD-10-CM | POA: Diagnosis not present

## 2019-08-19 DIAGNOSIS — S299XXA Unspecified injury of thorax, initial encounter: Secondary | ICD-10-CM | POA: Diagnosis not present

## 2019-08-19 DIAGNOSIS — Z66 Do not resuscitate: Secondary | ICD-10-CM | POA: Diagnosis not present

## 2019-08-19 DIAGNOSIS — M810 Age-related osteoporosis without current pathological fracture: Secondary | ICD-10-CM | POA: Diagnosis not present

## 2019-08-19 DIAGNOSIS — S72141D Displaced intertrochanteric fracture of right femur, subsequent encounter for closed fracture with routine healing: Secondary | ICD-10-CM | POA: Diagnosis not present

## 2019-08-19 DIAGNOSIS — E119 Type 2 diabetes mellitus without complications: Secondary | ICD-10-CM | POA: Diagnosis not present

## 2019-08-19 DIAGNOSIS — M7989 Other specified soft tissue disorders: Secondary | ICD-10-CM | POA: Diagnosis not present

## 2019-08-19 DIAGNOSIS — R519 Headache, unspecified: Secondary | ICD-10-CM | POA: Diagnosis not present

## 2019-08-19 DIAGNOSIS — D649 Anemia, unspecified: Secondary | ICD-10-CM | POA: Diagnosis not present

## 2019-08-19 DIAGNOSIS — Z79899 Other long term (current) drug therapy: Secondary | ICD-10-CM | POA: Diagnosis not present

## 2019-08-19 DIAGNOSIS — Z20822 Contact with and (suspected) exposure to covid-19: Secondary | ICD-10-CM | POA: Diagnosis not present

## 2019-08-19 DIAGNOSIS — E871 Hypo-osmolality and hyponatremia: Secondary | ICD-10-CM | POA: Diagnosis not present

## 2019-08-19 DIAGNOSIS — Z8673 Personal history of transient ischemic attack (TIA), and cerebral infarction without residual deficits: Secondary | ICD-10-CM | POA: Diagnosis not present

## 2019-08-19 DIAGNOSIS — M79671 Pain in right foot: Secondary | ICD-10-CM | POA: Diagnosis not present

## 2019-08-19 DIAGNOSIS — Z7401 Bed confinement status: Secondary | ICD-10-CM | POA: Diagnosis not present

## 2019-08-19 DIAGNOSIS — N390 Urinary tract infection, site not specified: Secondary | ICD-10-CM | POA: Diagnosis not present

## 2019-08-19 DIAGNOSIS — I959 Hypotension, unspecified: Secondary | ICD-10-CM | POA: Diagnosis not present

## 2019-08-19 DIAGNOSIS — G4700 Insomnia, unspecified: Secondary | ICD-10-CM | POA: Diagnosis not present

## 2019-08-19 DIAGNOSIS — S0990XA Unspecified injury of head, initial encounter: Secondary | ICD-10-CM | POA: Diagnosis not present

## 2019-08-22 ENCOUNTER — Ambulatory Visit: Payer: Self-pay | Admitting: Adult Health Nurse Practitioner

## 2019-08-23 ENCOUNTER — Telehealth: Payer: Self-pay | Admitting: *Deleted

## 2019-08-23 NOTE — Telephone Encounter (Signed)
Patient was sent to a SNF following her discharge from [facility] for a fracture femur.  Per Dr Melford Aase, a message was left advising the spouse or patient to call our office when the patient is discharged to home.  The patient will need a hospital follow visit on the phone or in person.

## 2019-08-24 DIAGNOSIS — G4700 Insomnia, unspecified: Secondary | ICD-10-CM | POA: Diagnosis not present

## 2019-08-24 DIAGNOSIS — I10 Essential (primary) hypertension: Secondary | ICD-10-CM | POA: Diagnosis not present

## 2019-08-24 DIAGNOSIS — I959 Hypotension, unspecified: Secondary | ICD-10-CM | POA: Diagnosis not present

## 2019-08-24 DIAGNOSIS — S72141D Displaced intertrochanteric fracture of right femur, subsequent encounter for closed fracture with routine healing: Secondary | ICD-10-CM | POA: Diagnosis not present

## 2019-08-24 DIAGNOSIS — R262 Difficulty in walking, not elsewhere classified: Secondary | ICD-10-CM | POA: Diagnosis not present

## 2019-08-24 DIAGNOSIS — N390 Urinary tract infection, site not specified: Secondary | ICD-10-CM | POA: Diagnosis not present

## 2019-08-24 DIAGNOSIS — M2550 Pain in unspecified joint: Secondary | ICD-10-CM | POA: Diagnosis not present

## 2019-08-24 DIAGNOSIS — E119 Type 2 diabetes mellitus without complications: Secondary | ICD-10-CM | POA: Diagnosis not present

## 2019-08-24 DIAGNOSIS — Z7401 Bed confinement status: Secondary | ICD-10-CM | POA: Diagnosis not present

## 2019-08-24 DIAGNOSIS — D62 Acute posthemorrhagic anemia: Secondary | ICD-10-CM | POA: Diagnosis not present

## 2019-08-24 DIAGNOSIS — R0902 Hypoxemia: Secondary | ICD-10-CM | POA: Diagnosis not present

## 2019-08-24 DIAGNOSIS — D649 Anemia, unspecified: Secondary | ICD-10-CM | POA: Diagnosis not present

## 2019-08-24 DIAGNOSIS — W19XXXD Unspecified fall, subsequent encounter: Secondary | ICD-10-CM | POA: Diagnosis not present

## 2019-08-24 DIAGNOSIS — G8918 Other acute postprocedural pain: Secondary | ICD-10-CM | POA: Diagnosis not present

## 2019-08-24 DIAGNOSIS — Z4789 Encounter for other orthopedic aftercare: Secondary | ICD-10-CM | POA: Diagnosis not present

## 2019-08-24 DIAGNOSIS — M810 Age-related osteoporosis without current pathological fracture: Secondary | ICD-10-CM | POA: Diagnosis not present

## 2019-08-24 DIAGNOSIS — Z20828 Contact with and (suspected) exposure to other viral communicable diseases: Secondary | ICD-10-CM | POA: Diagnosis not present

## 2019-08-24 DIAGNOSIS — E871 Hypo-osmolality and hyponatremia: Secondary | ICD-10-CM | POA: Diagnosis not present

## 2019-08-24 DIAGNOSIS — S72141A Displaced intertrochanteric fracture of right femur, initial encounter for closed fracture: Secondary | ICD-10-CM | POA: Diagnosis not present

## 2019-08-25 DIAGNOSIS — R262 Difficulty in walking, not elsewhere classified: Secondary | ICD-10-CM | POA: Diagnosis not present

## 2019-08-25 DIAGNOSIS — G8918 Other acute postprocedural pain: Secondary | ICD-10-CM | POA: Diagnosis not present

## 2019-08-25 DIAGNOSIS — D649 Anemia, unspecified: Secondary | ICD-10-CM | POA: Diagnosis not present

## 2019-08-25 DIAGNOSIS — S72141D Displaced intertrochanteric fracture of right femur, subsequent encounter for closed fracture with routine healing: Secondary | ICD-10-CM | POA: Diagnosis not present

## 2019-08-29 ENCOUNTER — Ambulatory Visit: Payer: PPO | Admitting: Physician Assistant

## 2019-08-29 ENCOUNTER — Ambulatory Visit: Payer: PPO | Admitting: Adult Health Nurse Practitioner

## 2019-09-04 ENCOUNTER — Other Ambulatory Visit: Payer: Self-pay

## 2019-09-04 ENCOUNTER — Other Ambulatory Visit: Payer: Self-pay | Admitting: Internal Medicine

## 2019-09-04 DIAGNOSIS — Z79899 Other long term (current) drug therapy: Secondary | ICD-10-CM

## 2019-09-04 DIAGNOSIS — N390 Urinary tract infection, site not specified: Secondary | ICD-10-CM | POA: Diagnosis not present

## 2019-09-04 DIAGNOSIS — M1990 Unspecified osteoarthritis, unspecified site: Secondary | ICD-10-CM | POA: Diagnosis not present

## 2019-09-04 DIAGNOSIS — Z7982 Long term (current) use of aspirin: Secondary | ICD-10-CM | POA: Diagnosis not present

## 2019-09-04 DIAGNOSIS — Z794 Long term (current) use of insulin: Secondary | ICD-10-CM | POA: Diagnosis not present

## 2019-09-04 DIAGNOSIS — E871 Hypo-osmolality and hyponatremia: Secondary | ICD-10-CM | POA: Diagnosis not present

## 2019-09-04 DIAGNOSIS — Z4789 Encounter for other orthopedic aftercare: Secondary | ICD-10-CM | POA: Diagnosis not present

## 2019-09-04 DIAGNOSIS — R339 Retention of urine, unspecified: Secondary | ICD-10-CM | POA: Diagnosis not present

## 2019-09-04 DIAGNOSIS — F419 Anxiety disorder, unspecified: Secondary | ICD-10-CM | POA: Diagnosis not present

## 2019-09-04 DIAGNOSIS — E119 Type 2 diabetes mellitus without complications: Secondary | ICD-10-CM | POA: Diagnosis not present

## 2019-09-04 DIAGNOSIS — E785 Hyperlipidemia, unspecified: Secondary | ICD-10-CM | POA: Diagnosis not present

## 2019-09-04 DIAGNOSIS — I10 Essential (primary) hypertension: Secondary | ICD-10-CM | POA: Diagnosis not present

## 2019-09-04 DIAGNOSIS — Z8673 Personal history of transient ischemic attack (TIA), and cerebral infarction without residual deficits: Secondary | ICD-10-CM | POA: Diagnosis not present

## 2019-09-04 DIAGNOSIS — I2510 Atherosclerotic heart disease of native coronary artery without angina pectoris: Secondary | ICD-10-CM | POA: Diagnosis not present

## 2019-09-04 DIAGNOSIS — D62 Acute posthemorrhagic anemia: Secondary | ICD-10-CM | POA: Diagnosis not present

## 2019-09-04 DIAGNOSIS — W19XXXD Unspecified fall, subsequent encounter: Secondary | ICD-10-CM | POA: Diagnosis not present

## 2019-09-04 DIAGNOSIS — G4700 Insomnia, unspecified: Secondary | ICD-10-CM | POA: Diagnosis not present

## 2019-09-04 DIAGNOSIS — M80051D Age-related osteoporosis with current pathological fracture, right femur, subsequent encounter for fracture with routine healing: Secondary | ICD-10-CM | POA: Diagnosis not present

## 2019-09-04 DIAGNOSIS — K5909 Other constipation: Secondary | ICD-10-CM | POA: Diagnosis not present

## 2019-09-04 MED ORDER — ASPIRIN EC 325 MG PO TBEC: 325.0000 mg | DELAYED_RELEASE_TABLET | Freq: Two times a day (BID) | ORAL | 3 refills | Status: DC

## 2019-09-04 MED ORDER — ACETAMINOPHEN 325 MG PO TABS: ORAL_TABLET | ORAL | 2 refills | Status: DC

## 2019-09-04 MED ORDER — ONDANSETRON HCL 4 MG PO TABS: 4.0000 mg | ORAL_TABLET | ORAL | 0 refills | Status: DC | PRN

## 2019-09-04 MED ORDER — BETHANECHOL CHLORIDE 25 MG PO TABS: 25.0000 mg | ORAL_TABLET | Freq: Three times a day (TID) | ORAL | 0 refills | Status: DC

## 2019-09-04 MED ORDER — SENNA 8.6 MG PO TABS: 1.0000 | ORAL_TABLET | Freq: Every day | ORAL | 0 refills | Status: DC

## 2019-09-04 MED ORDER — SODIUM CHLORIDE 1 G PO TABS: 1.0000 g | ORAL_TABLET | Freq: Two times a day (BID) | ORAL | 0 refills | Status: DC

## 2019-09-04 NOTE — Progress Notes (Signed)
Hospital follow up  Assessment and Plan: Hospital visit follow up for   T2_NIDDM, poorly controlled Increase insulin to 18 units and start to increase once a week by 2 units for morning sugar above 150.  Close follow up  At high risk for injury related to fall Continue PT  S/P right hip fracture Counseled on Narcotic and benzo and how this can cause respiratory distress/arrest in patients.  She is in PT, has follow up with ortho some edema in her leg but denies warmth, swelling, hard cord Discussed risk of DVT and signs to look out for.   Tremor -     propranolol ER (INDERAL LA) 60 MG 24 hr capsule; Take 1 capsule (60 mg total) by mouth daily. - ? Essential tremor versus anxiety - low risk serotonin syndrome - will stop bisoprolol and switch to inderal will monitor BP   All medications were reviewed with patient and family and fully reconciled. All questions answered fully, and patient and family members were encouraged to call the office with any further questions or concerns. Discussed goal to avoid readmission related to this diagnosis.  Medications Discontinued During This Encounter  Medication Reason  . gabapentin (NEURONTIN) 600 MG tablet   . bisoprolol (ZEBETA) 5 MG tablet     Over 40 minutes of exam, counseling, chart review, and complex, high/moderate level critical decision making was performed this visit.   Future Appointments  Date Time Provider [place]  09/27/2019 11:30 AM Unk Pinto, MD [facility] None  03/12/2020  3:00 PM Vicie Mutters, PA-C [facility] None     HPI 69 y.o.female presents for follow up for transition from recent hospitalization or SNIF stay. Admit date to the hospital was 04/12/18, patient was discharged from the hospital on 04/12/18 and our clinical staff contacted the office the day after discharge to set up a follow up appointment. The discharge summary, medications, and diagnostic test results were reviewed before meeting with  the patient. The patient was admitted to [facility] on 08/19/2019 and discharged to [facility] SNF for:  Due to a fractured right hip from a mechanical fall.  S/p open reduction and internal fixation of right hip 08/19/2018 with Dr. Donivan Scull, has transitioned from 328m ASA to 875m  Her sodium was low, she received 1 unit PRBC after surgery.  She also had a UTI, given ceftriaxone.  Her A1C was 11.6 and she is suppose to follow up with endrocrine. She is on regular insulin.  Sugar this AM before food was 190, lantus 16 units at night and the glimepiride 4 mg twice a day.   She is getting handicap ramp at this time. She has been crying some because she can not drive or do anything and she is very upset. She states she is shaking with movement like trying to get the spoon to her mouth, but will also have shaking all over her whole body. Will have occ palpitations.   She is on tylenol for pain and trying to rarely take the oxycodone. She takes a xanax one at bed time and one in the afternoon. She is not on gabapentin at this time, will take off her list for this time.   Home health is involved.    Current Outpatient Medications (Endocrine & Metabolic):  .  glimepiride (AMARYL) 4 MG tablet, TAKE ONE TABLET BY MOUTH TWICE DAILY BEFORE LUNCH AND SUPPER .  Insulin Glargine (LANTUS SOLOSTAR) 100 UNIT/ML Solostar Pen, Inject 16 Units into the skin at bedtime.  Current  Outpatient Medications (Cardiovascular):  .  lisinopril (ZESTRIL) 5 MG tablet, Take 0.5 tablets (2.5 mg total) by mouth daily. Take in the evening. .  nitroGLYCERIN (NITROSTAT) 0.4 MG SL tablet, Place 1 tablet (0.4 mg total) under the tongue every 5 (five) minutes as needed for chest pain. .  rosuvastatin (CRESTOR) 40 MG tablet, Take 0.5 tablets (20 mg total) by mouth See admin instructions. Take 1/2 tab twice weekly for cholesterol. ONLY ON Monday AND Friday (Patient taking differently: Take 20 mg by mouth See admin instructions. Take 1/2  tab three times weekly for cholesterol. ONLY ON Monday, Wednesday AND Friday) .  isosorbide mononitrate (IMDUR) 60 MG 24 hr tablet, Take 1 tablet (60 mg total) by mouth daily. .  propranolol ER (INDERAL LA) 60 MG 24 hr capsule, Take 1 capsule (60 mg total) by mouth daily.  Current Outpatient Medications (Respiratory):  .  albuterol (PROVENTIL) (2.5 MG/3ML) 0.083% nebulizer solution, Take 3 mLs (2.5 mg total) by nebulization every 6 (six) hours as needed for wheezing or shortness of breath. .  budesonide-formoterol (SYMBICORT) 160-4.5 MCG/ACT inhaler, Inhale 2 puffs into the lungs 2 (two) times daily. (Patient taking differently: Inhale 2 puffs into the lungs 2 (two) times daily as needed (for flares). )  Current Outpatient Medications (Analgesics):  .  acetaminophen (TYLENOL) 325 MG tablet, Take three tablets every 8 hours for pain .  aspirin EC 325 MG tablet, Take 1 tablet (325 mg total) by mouth 2 (two) times daily.   Current Outpatient Medications (Other):  Marland Kitchen  ALPRAZolam (XANAX) 1 MG tablet, Take 1/2 - 1 tablet 2 - 3 x /day ONLY if needed for Anxiety Attack &  limit to 5 days /week to avoid Addiction & Dementia .  bethanechol (URECHOLINE) 25 MG tablet, Take 1 tablet (25 mg total) by mouth 3 (three) times daily. .  Cholecalciferol (VITAMIN D3) 1000 units CAPS, Take 1,000 Units by mouth daily.  .  famotidine (PEPCID) 20 MG tablet, Take 1 tablet (20 mg total) by mouth 2 (two) times daily. (Patient taking differently: Take 20 mg by mouth daily. ) .  Insulin Pen Needle (CAREFINE PEN NEEDLES) 32G X 4 MM MISC, Use 1x a day .  ondansetron (ZOFRAN) 4 MG tablet, Take 1 tablet (4 mg total) by mouth every 4 (four) hours as needed for nausea or vomiting. .  senna (SENOKOT) 8.6 MG TABS tablet, Take 1 tablet (8.6 mg total) by mouth at bedtime. .  sodium chloride 1 g tablet, Take 1 tablet (1 g total) by mouth 2 (two) times daily with a meal. .  traZODone (DESYREL) 50 MG tablet, Take 1/2 to 1 tablet at  Bedtime if needed for Sleep  Past Medical History:  Diagnosis Date  . Anxiety   . Anxiety disorder   . Cirrhosis ([place])   . COPD (chronic obstructive pulmonary disease) ([place])    followed by pcp--  last exacerbation 11/ 2017  . Coronary artery calcification seen on CT scan   . Depression    Patient denies  . Gastritis   . History of rib fracture    2004  . 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 Hyperlipidemia   . Hypertension   . OA (osteoarthritis)    back,  hips, knees, feet  . Osteoporosis   . Polyp of colon, hyperplastic   . Pulmonary nodules    per CT chest 08-28-2016  -- bilateral upper lobe nodules  . Stroke ([place])   . Tubular adenoma of colon   . Type 2 diabetes mellitus ([place])    followed by pcp--  last A1c 9.1 on 08-01-2016  . UTI (urinary tract infection)   . Wears dentures      Allergies  Allergen Reactions  . Actos [Pioglitazone] Other (See Comments)    Patient stated that it elevated her BGL  . Glyburide Other (See Comments)    Patient stated that it elevated her BGL  . Metformin And Related     "metformin caused mini-strokes" and ELEVATED BS and also GLYBURIDE, ONGLYZA, ACTOS, INVOKANA caused elevated BS  . Onglyza [Saxagliptin] Other (See Comments)    Patient stated that it elevated her BGL  . Insulins Rash  . Janumet [Sitagliptin-Metformin Hcl] Rash and Other (See Comments)    Shakes/ rash  . Penicillins Rash and Other (See Comments)    ROS: all negative except above.   Physical Exam: Filed Weights   09/06/19 1346  Weight: 138 lb (62.6 kg)   BP 130/60   Wt 138 lb (62.6 kg)   SpO2 98%   BMI 28.84 kg/m  General Appearance:Well sounding, in no apparent distress.  ENT/Mouth: No hoarseness, No cough for duration of visit.  Respiratory: completing  full sentences without distress, without audible wheeze Neuro: Awake and oriented X 3,  Psych:  Insight and Judgment appropriate.   Vicie Mutters, PA-C 2:34 PM [facility]

## 2019-09-04 NOTE — Telephone Encounter (Signed)
Refill request for Xanax. In que for your review.

## 2019-09-06 ENCOUNTER — Ambulatory Visit: Payer: PPO | Admitting: Physician Assistant

## 2019-09-06 ENCOUNTER — Other Ambulatory Visit: Payer: Self-pay

## 2019-09-06 ENCOUNTER — Encounter: Payer: Self-pay | Admitting: Physician Assistant

## 2019-09-06 VITALS — BP 130/60 | Wt 138.0 lb

## 2019-09-06 DIAGNOSIS — Z8781 Personal history of (healed) traumatic fracture: Secondary | ICD-10-CM | POA: Insufficient documentation

## 2019-09-06 DIAGNOSIS — R251 Tremor, unspecified: Secondary | ICD-10-CM

## 2019-09-06 DIAGNOSIS — Z9181 History of falling: Secondary | ICD-10-CM | POA: Diagnosis not present

## 2019-09-06 DIAGNOSIS — E1165 Type 2 diabetes mellitus with hyperglycemia: Secondary | ICD-10-CM

## 2019-09-06 HISTORY — DX: Personal history of (healed) traumatic fracture: Z87.81

## 2019-09-06 MED ORDER — PROPRANOLOL HCL ER 60 MG PO CP24: 60.0000 mg | ORAL_CAPSULE | Freq: Every day | ORAL | 11 refills | Status: DC

## 2019-09-06 NOTE — Patient Instructions (Addendum)
Stop the bisoprolol blood pressure Will switch to to propranolol for the tremor and heart- start 60 mg one pill daily- monitor BP may need to go up in dose, if you BP is over 140/70 let us know.   The lantus ONLY affects your morning insulin.    This insulin provides blood sugar control for up to 24 hours.   Go up to 18 units at bedtime daily and increase by 2 units every 7 days until the waking up sugars are under 150. Then continue the same dose.   If blood sugar is under 90 for 2 days in a row, reduce the dose by 2 units. Note that this insulin does not control the rise of blood sugar with meals    Please remember only take the insulin WITH food, if you are sick or unable to eat DO NOT take your insulin. Also a low blood sugar is much more dangerous than a high blood sugar. Your brain needs 2 things, oxygen and sugar, so lets make sure it gets both. If at any time you have a question or concern, call the office or message [place] in [place].    Your A1C is a measure of your sugar over the past 3 months and is not affected by what you have eaten over the past few days. Diabetes increases your chances of stroke and heart attack over 300 % and is the leading cause of blindness and kidney failure in the [place]. Please make sure you decrease bad carbs like white bread, white rice, potatoes, corn, soft drinks, pasta, cereals, refined sugars, sweet tea, dried fruits, and fruit juice. Good carbs are okay to eat in moderation like sweet potatoes, brown rice, whole grain pasta/bread, most fruit (except dried fruit) and you can eat as many veggies as you want.   Greater than 6.5 is considered diabetic. Between 6.4 and 5.7 is prediabetic If your A1C is less than 5.7 you are NOT diabetic.  Targets for Glucose Readings: Time of Check Target for patients WITHOUT Diabetes Target for DIABETICS  Before Meals Less than 100  less than 150  Two hours after meals Less than 200  Less than 250    If your  morning sugar is always below 120 but your A1C is still elevated then the issue is with your sugar spiking after meals. Try to take your blood sugar approximately 2 hours after eating, this number should be less than 200. If it is not, think about the foods that you ate and better choices you can make.    HYPERTENSION INFORMATION  Monitor your blood pressure at home, please keep a record and bring that in with you to your next office visit.   Go to the ER if any CP, SOB, nausea, dizziness, severe HA, changes vision/speech  Testing/Procedures: HOW TO TAKE YOUR BLOOD PRESSURE:  Rest 5 minutes before taking your blood pressure.  Don't smoke or drink caffeinated beverages for at least 30 minutes before.  Take your blood pressure before (not after) you eat.  Sit comfortably with your back supported and both feet on the floor (don't cross your legs).  Elevate your arm to heart level on a table or a desk.  Use the proper sized cuff. It should fit smoothly and snugly around your bare upper arm. There should be enough room to slip a fingertip under the cuff. The bottom edge of the cuff should be 1 inch above the crease of the elbow.  Due to a  recent study, SPRINT, we have changed our goal for the systolic or top blood pressure number. Ideally we want your top number at 120.  In the [facility] Trial, 5000 people were randomized to a goal BP of 120 and 5000 people were randomized to a goal BP of less than 140. The patients with the goal BP at 120 had LESS DEMENTIA, LESS HEART ATTACKS, AND LESS STROKES, AS WELL AS OVERALL DECREASED MORTALITY OR DEATH RATE.   There was another study that showed taking your blood pressure medications at night decrease cardiovascular events.  However if you are on a fluid pill, please take this in the morning.   If you are willing, our goal BP is the top number of 120.  Your most recent BP: BP: 130/60   Take your medications faithfully as instructed. Maintain a  healthy weight. Get at least 150 minutes of aerobic exercise per week. Minimize salt intake. Minimize alcohol intake  DASH Eating Plan DASH stands for "Dietary Approaches to Stop Hypertension." The DASH eating plan is a healthy eating plan that has been shown to reduce high blood pressure (hypertension). Additional health benefits may include reducing the risk of type 2 diabetes mellitus, heart disease, and stroke. The DASH eating plan may also help with weight loss. WHAT DO I NEED TO KNOW ABOUT THE DASH EATING PLAN? For the DASH eating plan, you will follow these general guidelines:  Choose foods with a percent daily value for sodium of less than 5% (as listed on the food label).  Use salt-free seasonings or herbs instead of table salt or sea salt.  Check with your health care provider or pharmacist before using salt substitutes.  Eat lower-sodium products, often labeled as "lower sodium" or "no salt added."  Eat fresh foods.  Eat more vegetables, fruits, and low-fat dairy products.  Choose whole grains. Look for the word "whole" as the first word in the ingredient list.  Choose fish and skinless chicken or [place] more often than red meat. Limit fish, poultry, and meat to 6 oz (170 g) each day.  Limit sweets, desserts, sugars, and sugary drinks.  Choose heart-healthy fats.  Limit cheese to 1 oz (28 g) per day.  Eat more home-cooked food and less restaurant, buffet, and fast food.  Limit fried foods.  Cook foods using methods other than frying.  Limit canned vegetables. If you do use them, rinse them well to decrease the sodium.  When eating at a restaurant, ask that your food be prepared with less salt, or no salt if possible. WHAT FOODS CAN I EAT? Seek help from a dietitian for individual calorie needs. Grains Whole grain or whole wheat bread. Brown rice. Whole grain or whole wheat pasta. Quinoa, bulgur, and whole grain cereals. Low-sodium cereals. Corn or whole wheat  flour tortillas. Whole grain cornbread. Whole grain crackers. Low-sodium crackers. Vegetables Fresh or frozen vegetables (raw, steamed, roasted, or grilled). Low-sodium or reduced-sodium tomato and vegetable juices. Low-sodium or reduced-sodium tomato sauce and paste. Low-sodium or reduced-sodium canned vegetables.  Fruits All fresh, canned (in natural juice), or frozen fruits. Meat and Other Protein Products Ground beef (85% or leaner), grass-fed beef, or beef trimmed of fat. Skinless chicken or [place]. Ground chicken or [place]. Pork trimmed of fat. All fish and seafood. Eggs. Dried beans, peas, or lentils. Unsalted nuts and seeds. Unsalted canned beans. Dairy Low-fat dairy products, such as skim or 1% milk, 2% or reduced-fat cheeses, low-fat ricotta or cottage cheese, or plain low-fat yogurt. Low-sodium  or reduced-sodium cheeses. Fats and Oils Tub margarines without trans fats. Light or reduced-fat mayonnaise and salad dressings (reduced sodium). Avocado. Safflower, olive, or canola oils. Natural peanut or almond butter. Other Unsalted popcorn and pretzels. The items listed above may not be a complete list of recommended foods or beverages. Contact your dietitian for more options. WHAT FOODS ARE NOT RECOMMENDED? Grains White bread. White pasta. White rice. Refined cornbread. Bagels and croissants. Crackers that contain trans fat. Vegetables Creamed or fried vegetables. Vegetables in a cheese sauce. Regular canned vegetables. Regular canned tomato sauce and paste. Regular tomato and vegetable juices. Fruits Dried fruits. Canned fruit in light or heavy syrup. Fruit juice. Meat and Other Protein Products Fatty cuts of meat. Ribs, chicken wings, bacon, sausage, bologna, salami, chitterlings, fatback, hot dogs, bratwurst, and packaged luncheon meats. Salted nuts and seeds. Canned beans with salt. Dairy Whole or 2% milk, cream, half-and-half, and cream cheese. Whole-fat or sweetened yogurt.  Full-fat cheeses or blue cheese. Nondairy creamers and whipped toppings. Processed cheese, cheese spreads, or cheese curds. Condiments Onion and garlic salt, seasoned salt, table salt, and sea salt. Canned and packaged gravies. Worcestershire sauce. Tartar sauce. Barbecue sauce. Teriyaki sauce. Soy sauce, including reduced sodium. Steak sauce. Fish sauce. Oyster sauce. Cocktail sauce. Horseradish. Ketchup and mustard. Meat flavorings and tenderizers. Bouillon cubes. Hot sauce. Tabasco sauce. Marinades. Taco seasonings. Relishes. Fats and Oils Butter, stick margarine, lard, shortening, ghee, and bacon fat. Coconut, palm kernel, or palm oils. Regular salad dressings. Other Pickles and olives. Salted popcorn and pretzels. The items listed above may not be a complete list of foods and beverages to avoid. Contact your dietitian for more information. WHERE CAN I FIND MORE INFORMATION? National Heart, Lung, and Blood Institute: travelstabloid.com Document Released: 07/02/2011 Document Revised: 11/27/2013 Document Reviewed: 05/17/2013 [facility] Patient Information 2015 [place], [place]. This information is not intended to replace advice given to you by your health care provider. Make sure you discuss any questions you have with your health care provider.   Tremor A tremor is trembling or shaking that you cannot control. Most tremors affect the hands or arms. Tremors can also affect the head, vocal cords, face, and other parts of the body. There are many types of tremors. Common types include:  Essential tremor. These usually occur in people older than 40. It may run in families and can happen in otherwise healthy people.  Resting tremor. These occur when the muscles are at rest, such as when your hands are resting in your lap. People with Parkinson's disease often have resting tremors.  Postural tremor. These occur when you try to hold a pose, such as keeping your hands  outstretched.  Kinetic tremor. These occur during purposeful movement, such as trying to touch a finger to your nose.  Task-specific tremor. These may occur when you perform certain tasks such as writing, speaking, or standing.  Psychogenic tremor. These dramatically lessen or disappear when you are distracted. They can happen in people of all ages. Some types of tremors have no known cause. Tremors can also be a symptom of nervous system problems (neurological disorders) that may occur with aging. Some tremors go away with treatment, while others do not. Follow these instructions at home: Lifestyle      Limit alcohol intake to no more than 1 drink a day for nonpregnant women and 2 drinks a day for men. One drink equals 12 oz of beer, 5 oz of wine, or 1 oz of hard liquor.  Do not  use any products that contain nicotine or tobacco, such as cigarettes and e-cigarettes. If you need help quitting, ask your health care provider.  Avoid extreme heat and extreme cold.  Limit your caffeine intake, as told by your health care provider.  Try to get 8 hours of sleep each night.  Find ways to manage your stress, such as meditation or yoga. General instructions  Take over-the-counter and prescription medicines only as told by your health care provider.  Keep all follow-up visits as told by your health care provider. This is important. Contact a health care provider if you:  Develop a tremor after starting a new medicine.  Have a tremor along with other symptoms such as: ? Numbness. ? Tingling. ? Pain. ? Weakness.  Notice that your tremor gets worse.  Notice that your tremor interferes with your day-to-day life. Summary  A tremor is trembling or shaking that you cannot control.  Most tremors affect the hands or arms.  Some types of tremors have no known cause. Others may be a symptom of nervous system problems (neurological disorders).  Make sure you discuss any tremors you have  with your health care provider. This information is not intended to replace advice given to you by your health care provider. Make sure you discuss any questions you have with your health care provider. Document Revised: 06/25/2017 Document Reviewed: 05/13/2017 Elsevier Patient Education  2020 [employer].

## 2019-09-26 NOTE — Patient Instructions (Signed)

## 2019-09-26 NOTE — Progress Notes (Signed)
History of Present Illness:       This very nice 69 y.o. MWF presents for 3 month follow up with HTN, HLD, Pre-Diabetes and Vitamin D Deficiency.       On Aug 20, 2019, patent had ORIF of a Rt hip Fx after a fall. Patient was d/c'd to [facility] for Rehab. There she was tx'd for a UTI and wrt her DM her A1c was 12.9 % elevated and at recent OV she was instructed in titrating insulin to control her hyperglycemia.        Patient is treated for HTN (1980)  & BP has been controlled at home. Today's BP is at goal - 136/64.  Last year, patient had a negative / Normal Heart Cath by Dr Ellyn Hack. Patient has had no complaints of any cardiac type chest pain, palpitations, dyspnea / orthopnea / PND, dizziness, claudication, or dependent edema.      Hyperlipidemia is controlled with diet & meds. Patient denies myalgias or other med SE's. Last Lipids were at goal except elevated Trig's:  Lab Results  Component Value Date   CHOL 141 06/19/2019   HDL 41 (L) 06/19/2019   LDLCALC 69 06/19/2019   TRIG 217 (H) 06/19/2019   CHOLHDL 3.4 06/19/2019    Also, the patient has history of T2_NIDDM predating since 2000 and she has been chronically in poor control due to poor compliance and limited insight to her  Diabetes. She has had no symptoms of reactive hypoglycemia, diabetic polys, paresthesias or visual blurring.  Last A1c was poorly controlled:  Lab Results  Component Value Date   HGBA1C 12.9 (H) 06/19/2019           Further, the patient also has history of Vitamin D Deficiency ("17"/ 2009)  and supplements vitamin D without any suspected side-effects. Last vitamin D was still very low:   Lab Results  Component Value Date   VD25OH 11 (L) 06/19/2019    Current Outpatient Medications on File Prior to Visit  Medication Sig  . acetaminophen (TYLENOL) 325 MG tablet Take three tablets every 8 hours for pain  . albuterol (PROVENTIL) (2.5 MG/3ML) 0.083% nebulizer solution Take 3 mLs (2.5 mg total)  by nebulization every 6 (six) hours as needed for wheezing or shortness of breath.  Marland Kitchen aspirin EC 325 MG tablet Take 1 tablet (325 mg total) by mouth 2 (two) times daily.  . bethanechol (URECHOLINE) 25 MG tablet Take 1 tablet (25 mg total) by mouth 3 (three) times daily.  . budesonide-formoterol (SYMBICORT) 160-4.5 MCG/ACT inhaler Inhale 2 puffs into the lungs 2 (two) times daily. (Patient taking differently: Inhale 2 puffs into the lungs 2 (two) times daily as needed (for flares). )  . Cholecalciferol (VITAMIN D3) 1000 units CAPS Take 1,000 Units by mouth daily.   . famotidine (PEPCID) 20 MG tablet Take 1 tablet (20 mg total) by mouth 2 (two) times daily. (Patient taking differently: Take 20 mg by mouth daily. )  . Insulin Glargine (LANTUS SOLOSTAR) 100 UNIT/ML Solostar Pen Inject 16 Units into the skin at bedtime.  . Insulin Pen Needle (CAREFINE PEN NEEDLES) 32G X 4 MM MISC Use 1x a day  . lisinopril (ZESTRIL) 5 MG tablet Take 0.5 tablets (2.5 mg total) by mouth daily. Take in the evening.  . nitroGLYCERIN (NITROSTAT) 0.4 MG SL tablet Place 1 tablet (0.4 mg total) under the tongue every 5 (five) minutes as needed for chest pain.  Marland Kitchen ondansetron (ZOFRAN) 4 MG  tablet Take 1 tablet (4 mg total) by mouth every 4 (four) hours as needed for nausea or vomiting.  . propranolol ER (INDERAL LA) 60 MG 24 hr capsule Take 1 capsule (60 mg total) by mouth daily.  . rosuvastatin (CRESTOR) 40 MG tablet Take 0.5 tablets (20 mg total) by mouth See admin instructions. Take 1/2 tab twice weekly for cholesterol. ONLY ON Monday AND Friday (Patient taking differently: Take 20 mg by mouth See admin instructions. Take 1/2 tab three times weekly for cholesterol. ONLY ON Monday, Wednesday AND Friday)  . sodium chloride 1 g tablet Take 1 tablet (1 g total) by mouth 2 (two) times daily with a meal.  . traZODone (DESYREL) 50 MG tablet Take 1/2 to 1 tablet at Bedtime if needed for Sleep  . isosorbide mononitrate (IMDUR) 60 MG 24 hr  tablet Take 1 tablet (60 mg total) by mouth daily.   No current facility-administered medications on file prior to visit.    Allergies  Allergen Reactions  . Actos [Pioglitazone] Other (See Comments)    Patient stated that it elevated her BGL  . Glyburide Other (See Comments)    Patient stated that it elevated her BGL  . Metformin And Related     "metformin caused mini-strokes" and ELEVATED BS and also GLYBURIDE, ONGLYZA, ACTOS, INVOKANA caused elevated BS  . Onglyza [Saxagliptin] Other (See Comments)    Patient stated that it elevated her BGL  . Insulins Rash  . Janumet [Sitagliptin-Metformin Hcl] Rash and Other (See Comments)    Shakes/ rash  . Penicillins Rash and Other (See Comments)    PMHx:   Past Medical History:  Diagnosis Date  . Anxiety   . Anxiety disorder   . Cirrhosis ([place])   . COPD (chronic obstructive pulmonary disease) ([place])    followed by pcp--  last exacerbation 11/ 2017  . Coronary artery calcification seen on CT scan   . Depression    Patient denies  . Gastritis   . History of rib fracture    2004  . 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 Hyperlipidemia   . Hypertension   . OA (osteoarthritis)    back, hips, knees, feet  . Osteoporosis   . Polyp of colon, hyperplastic   . Pulmonary nodules    per CT chest 08-28-2016  -- bilateral upper lobe nodules  . Stroke ([place])   . Tubular adenoma of colon   . Type 2 diabetes mellitus ([place])    followed by pcp--  last A1c 9.1 on 08-01-2016  . UTI (urinary tract infection)   . Wears dentures     Immunization History  Administered Date(s) Administered  . Hep A / Hep B 01/07/2017, 02/08/2017, 07/09/2017  . Td 06/26/2001  . Tdap 04/30/2015    Past Surgical History:  Procedure Laterality Date  . BILATERAL  SALPINGOOPHORECTOMY  1997   via Laparoscopy  . CHOLECYSTECTOMY N/A 09/30/2016   Procedure: LAPAROSCOPIC CHOLECYSTECTOMY;  Surgeon: Mickeal Skinner, MD;  Location: [facility];  Service: General;  Laterality: N/A;  . INTRAVASCULAR PRESSURE WIRE/FFR STUDY N/A 04/12/2018   Procedure: INTRAVASCULAR PRESSURE WIRE/FFR STUDY;  Surgeon: Leonie Man, MD;  Location: [facility];  Service: Cardiovascular;  Laterality: N/A;  .  KNEE ARTHROSCOPY Right 11/2014  . LEFT HEART CATH AND CORONARY ANGIOGRAPHY N/A 04/12/2018   Procedure: LEFT HEART CATH AND CORONARY ANGIOGRAPHY;  Surgeon: Leonie Man, MD;  Location: [facility];  Service: Cardiovascular;  Laterality: N/A;  . TRANSTHORACIC ECHOCARDIOGRAM  02/2018    Normal LV function.  EF 60 to 65%.  No RWMMA.  GRII DD.  Moderate LA dilation.  Marland Kitchen VAGINAL HYSTERECTOMY  age 13    FHx:    Reviewed / unchanged  SHx:    Reviewed / unchanged   Systems Review:  Constitutional: Denies fever, chills, wt changes, headaches, insomnia, fatigue, night sweats, change in appetite. Eyes: Denies redness, blurred vision, diplopia, discharge, itchy, watery eyes.  ENT: Denies discharge, congestion, post nasal drip, epistaxis, sore throat, earache, hearing loss, dental pain, tinnitus, vertigo, sinus pain, snoring.  CV: Denies chest pain, palpitations, irregular heartbeat, syncope, dyspnea, diaphoresis, orthopnea, PND, claudication or edema. Respiratory: denies cough, dyspnea, DOE, pleurisy, hoarseness, laryngitis, wheezing.  Gastrointestinal: Denies dysphagia, odynophagia, heartburn, reflux, water brash, abdominal pain or cramps, nausea, vomiting, bloating, diarrhea, constipation, hematemesis, melena, hematochezia  or hemorrhoids. Genitourinary: Denies dysuria, frequency, urgency, nocturia, hesitancy, discharge, hematuria or flank pain. Musculoskeletal: Denies arthralgias, myalgias, stiffness, jt. swelling, pain, limping or strain/sprain.    Skin: Denies pruritus, rash, hives, warts, acne, eczema or change in skin lesion(s). Neuro: No weakness, tremor, incoordination, spasms, paresthesia or pain. Psychiatric: Denies confusion, memory loss or sensory loss. Endo: Denies change in weight, skin or hair change.  Heme/Lymph: No excessive bleeding, bruising or enlarged lymph nodes.  Physical Exam  BP 136/64   Pulse 64   Temp (!) 97.5 F (36.4 C)   Resp 16   Ht 4' 10"  (1.473 m)   Wt 132 lb 9.6 oz (60.1 kg)   BMI 27.71 kg/m   Appears  well nourished, well groomed  and in no distress.  Eyes: PERRLA, EOMs, conjunctiva no swelling or erythema. Sinuses: No frontal/maxillary tenderness ENT/Mouth: EAC's clear, TM's nl w/o erythema, bulging. Nares clear w/o erythema, swelling, exudates. Oropharynx clear without erythema or exudates. Oral hygiene is good. Tongue normal, non obstructing. Hearing intact.  Neck: Supple. Thyroid not palpable. Car 2+/2+ without bruits, nodes or JVD. Chest: Respirations nl with BS clear & equal w/o rales, rhonchi, wheezing or stridor.  Cor: Heart sounds normal w/ regular rate and rhythm without sig. murmurs, gallops, clicks or rubs. Peripheral pulses normal and equal  without edema.  Abdomen: Soft & bowel sounds normal. Non-tender w/o guarding, rebound, hernias, masses or organomegaly.  Lymphatics: Unremarkable.  Musculoskeletal: Full ROM all peripheral extremities, joint stability, 5/5 strength and normal gait.  Skin: Warm, dry without exposed rashes, lesions or ecchymosis apparent.  Neuro: Cranial nerves intact, reflexes equal bilaterally. Sensory-motor testing grossly intact. Tendon reflexes grossly intact.  Pysch: Alert & oriented x 3.  Insight and judgement nl & appropriate. No ideations.  Assessment and Plan:  1. Essential hypertension  - Continue medication, monitor blood pressure at home.  - Continue DASH diet.  Reminder to go to the ER if any CP,  SOB, nausea, dizziness, severe HA, changes  vision/speech.  - CBC with Differential/Platelet - COMPLETE METABOLIC PANEL WITH GFR - Magnesium - TSH  2. Hyperlipidemia associated with type 2 diabetes mellitus ([place])  - Continue diet/meds, exercise,& lifestyle modifications.  - Continue monitor periodic cholesterol/liver & renal functions   - Lipid panel - TSH  3. Type 2 diabetes mellitus with stage 2 chronic kidney disease,  with long-term current use of  insulin ([facility])  - Continue diet, exercise  - Lifestyle modifications.  - Monitor appropriate labs.  - Hemoglobin A1c  4. Insulin-requiring or dependent type II diabetes mellitus ([place])  - Advised increase Insulin dose by 3 units  Ever 5 days to attempt to control glucses  - Hemoglobin A1c  5. Vitamin D deficiency  - VITAMIN D 25 Hydroxy  6. Gout  - Uric acid  7. Poor compliance with medication   8. Medication management  - CBC with Differential/Platelet - COMPLETE METABOLIC PANEL WITH GFR - Magnesium - Lipid panel - TSH - Hemoglobin A1c - VITAMIN D 25 Hydroxy - Uric acid  9. Edema of both lower extremities due to peripheral venous insufficiency        Discussed  regular exercise, BP monitoring, weight control to achieve/maintain BMI less than 25 and discussed med and SE's. Recommended labs to assess and monitor clinical status with further disposition pending results of labs.  I discussed the assessment and treatment plan with the patient. The patient was provided an opportunity to ask questions and all were answered. The patient agreed with the plan and demonstrated an understanding of the instructions.  I provided over 30 minutes of exam, counseling, chart review and  complex critical decision making.   Kirtland Bouchard, MD

## 2019-09-27 ENCOUNTER — Other Ambulatory Visit: Payer: Self-pay

## 2019-09-27 ENCOUNTER — Other Ambulatory Visit: Payer: Self-pay | Admitting: *Deleted

## 2019-09-27 ENCOUNTER — Encounter: Payer: Self-pay | Admitting: Internal Medicine

## 2019-09-27 ENCOUNTER — Ambulatory Visit (INDEPENDENT_AMBULATORY_CARE_PROVIDER_SITE_OTHER): Payer: PPO | Admitting: Internal Medicine

## 2019-09-27 ENCOUNTER — Other Ambulatory Visit: Payer: Self-pay | Admitting: Internal Medicine

## 2019-09-27 VITALS — BP 136/64 | HR 64 | Temp 97.5°F | Resp 16 | Ht <= 58 in | Wt 132.6 lb

## 2019-09-27 DIAGNOSIS — Z9114 Patient's other noncompliance with medication regimen: Secondary | ICD-10-CM

## 2019-09-27 DIAGNOSIS — Z794 Long term (current) use of insulin: Secondary | ICD-10-CM

## 2019-09-27 DIAGNOSIS — E1169 Type 2 diabetes mellitus with other specified complication: Secondary | ICD-10-CM

## 2019-09-27 DIAGNOSIS — E1122 Type 2 diabetes mellitus with diabetic chronic kidney disease: Secondary | ICD-10-CM

## 2019-09-27 DIAGNOSIS — N182 Chronic kidney disease, stage 2 (mild): Secondary | ICD-10-CM

## 2019-09-27 DIAGNOSIS — I10 Essential (primary) hypertension: Secondary | ICD-10-CM

## 2019-09-27 DIAGNOSIS — I872 Venous insufficiency (chronic) (peripheral): Secondary | ICD-10-CM | POA: Diagnosis not present

## 2019-09-27 DIAGNOSIS — Z79899 Other long term (current) drug therapy: Secondary | ICD-10-CM | POA: Diagnosis not present

## 2019-09-27 DIAGNOSIS — M109 Gout, unspecified: Secondary | ICD-10-CM | POA: Diagnosis not present

## 2019-09-27 DIAGNOSIS — E119 Type 2 diabetes mellitus without complications: Secondary | ICD-10-CM | POA: Diagnosis not present

## 2019-09-27 DIAGNOSIS — E1151 Type 2 diabetes mellitus with diabetic peripheral angiopathy without gangrene: Secondary | ICD-10-CM

## 2019-09-27 DIAGNOSIS — Z91148 Patient's other noncompliance with medication regimen for other reason: Secondary | ICD-10-CM

## 2019-09-27 DIAGNOSIS — I700 Atherosclerosis of aorta: Secondary | ICD-10-CM

## 2019-09-27 DIAGNOSIS — E785 Hyperlipidemia, unspecified: Secondary | ICD-10-CM | POA: Diagnosis not present

## 2019-09-27 DIAGNOSIS — E559 Vitamin D deficiency, unspecified: Secondary | ICD-10-CM

## 2019-09-27 DIAGNOSIS — F419 Anxiety disorder, unspecified: Secondary | ICD-10-CM

## 2019-09-27 MED ORDER — GLIMEPIRIDE 4 MG PO TABS: ORAL_TABLET | ORAL | 1 refills | Status: DC

## 2019-09-27 MED ORDER — FUROSEMIDE 40 MG PO TABS: ORAL_TABLET | ORAL | 1 refills | Status: DC

## 2019-09-28 LAB — COMPLETE METABOLIC PANEL WITH GFR
AG Ratio: 1.2 (calc) (ref 1.0–2.5)
ALT: 12 U/L (ref 6–29)
AST: 21 U/L (ref 10–35)
Albumin: 3.7 g/dL (ref 3.6–5.1)
Alkaline phosphatase (APISO): 135 U/L (ref 37–153)
BUN: 10 mg/dL (ref 7–25)
CO2: 29 mmol/L (ref 20–32)
Calcium: 9.8 mg/dL (ref 8.6–10.4)
Chloride: 96 mmol/L — ABNORMAL LOW (ref 98–110)
Creat: 0.7 mg/dL (ref 0.50–0.99)
GFR, Est African American: 103 mL/min/{1.73_m2} (ref >=60)
GFR, Est Non African American: 89 mL/min/{1.73_m2} (ref >=60)
Globulin: 3.1 g/dL (calc) (ref 1.9–3.7)
Glucose, Bld: 120 mg/dL — ABNORMAL HIGH (ref 65–99)
Potassium: 3.9 mmol/L (ref 3.5–5.3)
Sodium: 134 mmol/L — ABNORMAL LOW (ref 135–146)
Total Bilirubin: 0.4 mg/dL (ref 0.2–1.2)
Total Protein: 6.8 g/dL (ref 6.1–8.1)

## 2019-09-28 LAB — HEMOGLOBIN A1C
Hgb A1c MFr Bld: 7.5 % of total Hgb — ABNORMAL HIGH (ref <5.7)
Mean Plasma Glucose: 169 (calc)
eAG (mmol/L): 9.3 (calc)

## 2019-09-28 LAB — URIC ACID: Uric Acid, Serum: 3.1 mg/dL (ref 2.5–7.0)

## 2019-09-28 LAB — CBC WITH DIFFERENTIAL/PLATELET
Absolute Monocytes: 713 cells/uL (ref 200–950)
Basophils Absolute: 88 cells/uL (ref 0–200)
Basophils Relative: 1 %
Eosinophils Absolute: 282 cells/uL (ref 15–500)
Eosinophils Relative: 3.2 %
HCT: 43 % (ref 35.0–45.0)
Hemoglobin: 13.9 g/dL (ref 11.7–15.5)
Lymphs Abs: 2209 cells/uL (ref 850–3900)
MCH: 29.2 pg (ref 27.0–33.0)
MCHC: 32.3 g/dL (ref 32.0–36.0)
MCV: 90.3 fL (ref 80.0–100.0)
MPV: 10.2 fL (ref 7.5–12.5)
Monocytes Relative: 8.1 %
Neutro Abs: 5509 cells/uL (ref 1500–7800)
Neutrophils Relative %: 62.6 %
Platelets: 290 10*3/uL (ref 140–400)
RBC: 4.76 10*6/uL (ref 3.80–5.10)
RDW: 13.8 % (ref 11.0–15.0)
Total Lymphocyte: 25.1 %
WBC: 8.8 10*3/uL (ref 3.8–10.8)

## 2019-09-28 LAB — LIPID PANEL
Cholesterol: 129 mg/dL (ref <200)
HDL: 36 mg/dL — ABNORMAL LOW (ref >=50)
LDL Cholesterol (Calc): 69 mg/dL (calc)
Non-HDL Cholesterol (Calc): 93 mg/dL (calc) (ref <130)
Total CHOL/HDL Ratio: 3.6 (calc) (ref <5.0)
Triglycerides: 154 mg/dL — ABNORMAL HIGH (ref <150)

## 2019-09-28 LAB — MAGNESIUM: Magnesium: 1.8 mg/dL (ref 1.5–2.5)

## 2019-09-28 LAB — TSH: TSH: 0.69 mIU/L (ref 0.40–4.50)

## 2019-09-28 LAB — VITAMIN D 25 HYDROXY (VIT D DEFICIENCY, FRACTURES): Vit D, 25-Hydroxy: 18 ng/mL — ABNORMAL LOW (ref 30–100)

## 2019-10-04 DIAGNOSIS — M1990 Unspecified osteoarthritis, unspecified site: Secondary | ICD-10-CM | POA: Diagnosis not present

## 2019-10-04 DIAGNOSIS — D62 Acute posthemorrhagic anemia: Secondary | ICD-10-CM | POA: Diagnosis not present

## 2019-10-04 DIAGNOSIS — N390 Urinary tract infection, site not specified: Secondary | ICD-10-CM | POA: Diagnosis not present

## 2019-10-04 DIAGNOSIS — E785 Hyperlipidemia, unspecified: Secondary | ICD-10-CM | POA: Diagnosis not present

## 2019-10-04 DIAGNOSIS — E871 Hypo-osmolality and hyponatremia: Secondary | ICD-10-CM | POA: Diagnosis not present

## 2019-10-04 DIAGNOSIS — I10 Essential (primary) hypertension: Secondary | ICD-10-CM | POA: Diagnosis not present

## 2019-10-04 DIAGNOSIS — M80051D Age-related osteoporosis with current pathological fracture, right femur, subsequent encounter for fracture with routine healing: Secondary | ICD-10-CM | POA: Diagnosis not present

## 2019-10-04 DIAGNOSIS — Z8673 Personal history of transient ischemic attack (TIA), and cerebral infarction without residual deficits: Secondary | ICD-10-CM | POA: Diagnosis not present

## 2019-10-04 DIAGNOSIS — E119 Type 2 diabetes mellitus without complications: Secondary | ICD-10-CM | POA: Diagnosis not present

## 2019-10-04 DIAGNOSIS — K5909 Other constipation: Secondary | ICD-10-CM | POA: Diagnosis not present

## 2019-10-04 DIAGNOSIS — G4700 Insomnia, unspecified: Secondary | ICD-10-CM | POA: Diagnosis not present

## 2019-10-04 DIAGNOSIS — Z7982 Long term (current) use of aspirin: Secondary | ICD-10-CM | POA: Diagnosis not present

## 2019-10-04 DIAGNOSIS — Z794 Long term (current) use of insulin: Secondary | ICD-10-CM | POA: Diagnosis not present

## 2019-10-04 DIAGNOSIS — R339 Retention of urine, unspecified: Secondary | ICD-10-CM | POA: Diagnosis not present

## 2019-10-04 DIAGNOSIS — I2510 Atherosclerotic heart disease of native coronary artery without angina pectoris: Secondary | ICD-10-CM | POA: Diagnosis not present

## 2019-10-04 DIAGNOSIS — W19XXXD Unspecified fall, subsequent encounter: Secondary | ICD-10-CM | POA: Diagnosis not present

## 2019-10-04 DIAGNOSIS — F419 Anxiety disorder, unspecified: Secondary | ICD-10-CM | POA: Diagnosis not present

## 2019-10-05 ENCOUNTER — Telehealth: Payer: Self-pay

## 2019-10-05 NOTE — Telephone Encounter (Signed)
Sara Lamb, from [facility] is questioning if it is ok to take Propranolol and Symbicort together? Per Sara Lamb, that is fine.  [place] notified.

## 2019-10-09 NOTE — Progress Notes (Signed)
Cardiology Office Note   Date:  10/11/2019   ID:  Sara Lamb, DOB Jan 30, 1951, MRN [id number]  PCP:  Unk Pinto, MD  Cardiologist:  Dr. Ellyn Hack  CC: Follow up   History of Present Illness: Sara Lamb is a 69 y.o. female who presents for ongoing assessment and management of CAD with single vessel disease of the RCA per cardiac cath in 2019, of the proximal RCA, FFR 0.89 which did not require intervention, mild LCx disease. Other hx includes diabetes, DOE, HL, ongoing tobacco abuse, & HTN.   Was last seen by Dr. Ellyn Hack on 01/17/2019 with ongoing shortness of breath. Isosorbide was increased to 60 mg daily, with parameters to hold Lisinopril if SBP < 105, of SBP> 150 she is to take full dose of Bisoprolol 5 mg daily, otherwise continue 2.5 mg daily. She was increased on rosuvastatin to 3 days a week from 2 days a week.   [place] the end of January and was hospitalized for surgery to right hip fracture, ORIF, treated for a UTI. She was treated for elevated Hgb A1c of 12.9% with increased dose of insulin.  She was placed in [place] home and was sent home last week. She has HHN and PT/OT for home strengthening. Do for follow-up with orthopedics in 2 weeks.   She is medically compliant and followed closely by her PCP.  She denies chest pain or significant dyspnea except when she is undergoing PT. She is deconditioned but is motivated to work to get her mobility back.    She refuses COVID vaccination, as she does not take the Flu shot or Pneumonia shot either.   Past Medical History:  Diagnosis Date  . Anxiety   . Anxiety disorder   . Cirrhosis ([place])   . COPD (chronic obstructive pulmonary disease) ([place])    followed by pcp--  last exacerbation 11/ 2017  . Coronary artery calcification seen on CT scan   . Depression    Patient denies  . Gastritis   . History of rib fracture    2004  . History of TIAs    x5-- last one 2017--  "gets real sharp stabbing pain over eye and  speech screws up"  pt states takes excederin migraine and lies down (pt stated was told if she "if come to ER again they would send her to psych ward")  . Hyperlipidemia   . Hypertension   . OA (osteoarthritis)    back, hips, knees, feet  . Osteoporosis   . Polyp of colon, hyperplastic   . Pulmonary nodules    per CT chest 08-28-2016  -- bilateral upper lobe nodules  . Stroke ([place])   . Tubular adenoma of colon   . Type 2 diabetes mellitus ([place])    followed by pcp--  last A1c 9.1 on 08-01-2016  . UTI (urinary tract infection)   . Wears dentures     Past Surgical History:  Procedure Laterality Date  . BILATERAL SALPINGOOPHORECTOMY  1997   via Laparoscopy  . CHOLECYSTECTOMY N/A 09/30/2016   Procedure: LAPAROSCOPIC CHOLECYSTECTOMY;  Surgeon: Mickeal Skinner, MD;  Location: [facility];  Service: General;  Laterality: N/A;  . INTRAVASCULAR PRESSURE WIRE/FFR STUDY N/A 04/12/2018   Procedure: INTRAVASCULAR PRESSURE WIRE/FFR STUDY;  Surgeon: Leonie Man, MD;  Location: [facility];  Service: Cardiovascular;  Laterality: N/A;  . KNEE ARTHROSCOPY Right 11/2014  . LEFT HEART CATH AND CORONARY ANGIOGRAPHY N/A 04/12/2018   Procedure: LEFT HEART CATH  AND CORONARY ANGIOGRAPHY;  Surgeon: Leonie Man, MD;  Location: [facility];  Service: Cardiovascular;  Laterality: N/A;  . TRANSTHORACIC ECHOCARDIOGRAM  02/2018    Normal LV function.  EF 60 to 65%.  No RWMMA.  GRII DD.  Moderate LA dilation.  Marland Kitchen VAGINAL HYSTERECTOMY  age 44     Current Outpatient Medications  Medication Sig Dispense Refill  . acetaminophen (TYLENOL) 500 MG tablet Take 500 mg by mouth as directed.    Marland Kitchen albuterol (PROVENTIL) (2.5 MG/3ML) 0.083% nebulizer solution Take 3 mLs (2.5 mg total) by nebulization every 6 (six) hours as needed for wheezing or shortness of breath. 75 mL 12  . ALPRAZolam (XANAX) 1 MG tablet Take 1/2 -1 tablet 2-3 times daily ONLY if needed for Anxiety Attack & limit to  5 days /week to avoid Addiction & Dementia 60 tablet 0  . aspirin EC 81 MG tablet Take 81 mg by mouth daily.    . bethanechol (URECHOLINE) 25 MG tablet Take 1 tablet (25 mg total) by mouth 3 (three) times daily. 90 tablet 0  . budesonide-formoterol (SYMBICORT) 160-4.5 MCG/ACT inhaler Inhale 2 puffs into the lungs 2 (two) times daily. (Patient taking differently: Inhale 2 puffs into the lungs 2 (two) times daily as needed (for flares). ) 1 Inhaler 3  . Cholecalciferol (VITAMIN D3) 1000 units CAPS Take 1,000 Units by mouth daily.     . famotidine (PEPCID) 20 MG tablet Take 1 tablet (20 mg total) by mouth 2 (two) times daily. (Patient taking differently: Take 20 mg by mouth daily. ) 30 tablet 0  . furosemide (LASIX) 40 MG tablet Take 1/2 to 1 tablet Daily if needed ofr Fluid Retention / leg/ankle swelling 90 tablet 1  . glimepiride (AMARYL) 4 MG tablet TAKE ONE TABLET TWICE DAILY BEFORE LUNCH AND SUPPER 180 tablet 1  . Insulin Glargine (LANTUS SOLOSTAR) 100 UNIT/ML Solostar Pen Inject 16 Units into the skin at bedtime. (Patient taking differently: Inject 18 Units into the skin at bedtime. ) 5 pen 11  . Insulin Pen Needle (CAREFINE PEN NEEDLES) 32G X 4 MM MISC Use 1x a day 100 each 3  . isosorbide mononitrate (IMDUR) 60 MG 24 hr tablet Take 1 tablet (60 mg total) by mouth daily. 90 tablet 3  . lisinopril (ZESTRIL) 5 MG tablet Take 0.5 tablets (2.5 mg total) by mouth daily. Take in the evening. 90 tablet 1  . nitroGLYCERIN (NITROSTAT) 0.4 MG SL tablet Place 1 tablet (0.4 mg total) under the tongue every 5 (five) minutes as needed for chest pain. 25 tablet 5  . ondansetron (ZOFRAN) 4 MG tablet Take 1 tablet (4 mg total) by mouth every 4 (four) hours as needed for nausea or vomiting. 20 tablet 0  . propranolol ER (INDERAL LA) 60 MG 24 hr capsule Take 1 capsule (60 mg total) by mouth daily. 30 capsule 11  . rosuvastatin (CRESTOR) 40 MG tablet Take 0.5 tablets (20 mg total) by mouth See admin instructions.  Take 1/2 tab twice weekly for cholesterol. ONLY ON Monday AND Friday (Patient taking differently: Take 20 mg by mouth See admin instructions. Take 1/2 tab three times weekly for cholesterol. ONLY ON Monday, Wednesday AND Friday) 90 tablet 0  . sodium chloride 1 g tablet Take 1 tablet (1 g total) by mouth 2 (two) times daily with a meal. 60 tablet 0  . traZODone (DESYREL) 50 MG tablet Take 1/2 to 1 tablet at Bedtime if needed for Sleep 90 tablet 3  No current facility-administered medications for this visit.    Allergies:   Actos [pioglitazone], Glyburide, Metformin and related, Onglyza [saxagliptin], Insulins, Janumet [sitagliptin-metformin hcl], and Penicillins    Social History:  The patient  reports that she has been smoking cigarettes. She has a 19.00 pack-year smoking history. She has never used smokeless tobacco. She reports that she does not drink alcohol or use drugs.   Family History:  The patient's family history includes CAD in her maternal aunt, maternal uncle, and sister; CAD (age of onset: 66) in her mother; Cancer in her father; Coronary artery disease (age of onset: 19) in her brother; Diabetes in her brother, father, and mother; Heart attack in her maternal grandmother; Heart attack (age of onset: 21) in her sister; Hyperlipidemia in her brother, sister, and sister; Hypertension in her brother, father, and sister; Stomach cancer in her mother; Stroke in her mother.    ROS: All other systems are reviewed and negative. Unless otherwise mentioned in H&P    PHYSICAL EXAM: VS:  BP (!) 160/79   Pulse 78   Ht 4' 10"  (1.473 m)   Wt 135 lb (61.2 kg)   BMI 28.22 kg/m  , BMI Body mass index is 28.22 kg/m. GEN: Well nourished, well developed, in no acute distress HEENT: normal Neck: no JVD, carotid bruits, or masses Cardiac: RRR; no murmurs, rubs, or gallops,no edema  Respiratory:  Clear to auscultation bilaterally, normal work of breathing GI: soft, nontender, nondistended, +  BS MS: no deformity or atrophy Skin: warm and dry, no rash Neuro:  Strength and sensation are intact Psych: euthymic mood, full affect   EKG:  EKG is not ordered today.    Recent Labs: 09/27/2019: ALT 12; BUN 10; Creat 0.70; Hemoglobin 13.9; Magnesium 1.8; Platelets 290; Potassium 3.9; Sodium 134; TSH 0.69    Lipid Panel    Component Value Date/Time   CHOL 129 09/27/2019 1036   TRIG 154 (H) 09/27/2019 1036   HDL 36 (L) 09/27/2019 1036   CHOLHDL 3.6 09/27/2019 1036   VLDL 39 (H) 11/23/2016 1300   LDLCALC 69 09/27/2019 1036      Wt Readings from Last 3 Encounters:  10/11/19 135 lb (61.2 kg)  09/27/19 132 lb 9.6 oz (60.1 kg)  09/06/19 138 lb (62.6 kg)      Other studies Reviewed [facility] 04/12/2018   Prox RCA to Mid RCA lesion is 60% stenosed. FFR negative (0 29).  The left ventricular systolic function is normal. The left ventricular ejection fraction is greater than 65% by visual estimate.  LV end diastolic pressure is normal.  There is no mitral valve regurgitation with notable mitral annular calcification   Moderate single-vessel disease with FFR negative lesion proximal to mid RCA.  Plan: Discharge home after bedrest and TR band removal  Will likely titrate medications -continue Imdur.  Aggressive risk factor modification  Smoking cessation counseling  Recommend Aspirin 39m daily for moderate CAD.   Echocardiogram 03/03/2018 Left ventricle: The cavity size was normal. There was mild  concentric hypertrophy. Systolic function was normal. The  estimated ejection fraction was in the range of 60% to 65%. Wall  motion was normal; there were no regional wall motion  abnormalities. Features are consistent with a pseudonormal left  ventricular filling pattern, with concomitant abnormal relaxation  and increased filling pressure (grade 2 diastolic dysfunction).  Doppler parameters are consistent with high ventricular filling  pressure.  - Aortic  valve: Transvalvular velocity was within the normal range.  There was  no stenosis. There was no regurgitation.  - Mitral valve: Calcified annulus. Transvalvular velocity was  within the normal range. There was no evidence for stenosis.  There was trivial regurgitation.  - Left atrium: The atrium was moderately dilated.  - Right ventricle: The cavity size was normal. Wall thickness was  normal. Systolic function was normal.  - Tricuspid valve: There was no regurgitation.    ASSESSMENT AND PLAN:  1. Hypertension: Elevated today but normally lower on previous visits. Will not make any changes to her regimen at this time. She is given refills on isosorbide mononitrate. Continue propranolol, lisinopril, and lasix.   2. CAD: Non-obstructive. Continue risk factor modification and secondary prevention.   3. Type II diabetes: Followed by PCP with recent up titration of insulin dosage due to elevated Hgb A1c > 12.  Consider adding SGLT2 inhibitor if no improvement in values for cardioprotective properties. Most recent creatinine 0.70.   4. Hyperlipidemia:  Goal of LDL < 70. Currently on rosuvastatin 40 mg daily. Last LDL was 69 on labs 09/27/2019 by PCP.    Current medicines are reviewed at length with the patient today.  I have spent 20 dedicated to the care of this patient on the date of this encounter to include pre-visit review of records, assessment, management and diagnostic testing,with shared decision making.  Labs/ tests ordered today include: None followed by PCP. Phill Myron. [place] Pugh, ANP, AACC   10/11/2019 11:04 AM    [facility] [address] Office [phone] Fax [phone]  Notice: This dictation was prepared with Dragon dictation along with smaller phrase technology. Any transcriptional errors that result from this process are unintentional and may not be corrected upon review.

## 2019-10-11 ENCOUNTER — Encounter: Payer: Self-pay | Admitting: Adult Health

## 2019-10-11 ENCOUNTER — Telehealth (INDEPENDENT_AMBULATORY_CARE_PROVIDER_SITE_OTHER): Payer: PPO | Admitting: Adult Health

## 2019-10-11 VITALS — BP 160/79 | HR 78 | Ht <= 58 in | Wt 135.0 lb

## 2019-10-11 DIAGNOSIS — E785 Hyperlipidemia, unspecified: Secondary | ICD-10-CM

## 2019-10-11 DIAGNOSIS — I10 Essential (primary) hypertension: Secondary | ICD-10-CM

## 2019-10-11 DIAGNOSIS — Z8781 Personal history of (healed) traumatic fracture: Secondary | ICD-10-CM

## 2019-10-11 DIAGNOSIS — I2510 Atherosclerotic heart disease of native coronary artery without angina pectoris: Secondary | ICD-10-CM

## 2019-10-11 DIAGNOSIS — E1169 Type 2 diabetes mellitus with other specified complication: Secondary | ICD-10-CM

## 2019-10-11 MED ORDER — ISOSORBIDE MONONITRATE ER 60 MG PO TB24: 60.0000 mg | ORAL_TABLET | Freq: Every day | ORAL | 3 refills | Status: DC

## 2019-10-11 NOTE — Patient Instructions (Signed)
Medication Instructions:  Continue current medications  *If you need a refill on your cardiac medications before your next appointment, please call your pharmacy*   Lab Work: None Ordered   Testing/Procedures: None Ordered   Follow-Up: At [facility], you and your health needs are our priority.  As part of our continuing mission to provide you with exceptional heart care, we have created designated Provider Care Teams.  These Care Teams include your primary Cardiologist (physician) and Advanced Practice Providers (APPs -  Physician Assistants and Nurse Practitioners) who all work together to provide you with the care you need, when you need it.  We recommend signing up for the patient portal called "MyChart".  Sign up information is provided on this After Visit Summary.  MyChart is used to connect with patients for Virtual Visits (Telemedicine).  Patients are able to view lab/test results, encounter notes, upcoming appointments, etc.  Non-urgent messages can be sent to your provider as well.   To learn more about what you can do with MyChart, go to [email or web address].    Your next appointment:   4 month(s)  The format for your next appointment:   In Person  Provider:   You may see Glenetta Hew, MD or one of the following Advanced Practice Providers on your designated Care Team:    Rosaria Ferries, PA-C  Jory Sims, DNP, ANP  Cadence Kathlen Mody, NP

## 2019-10-17 DIAGNOSIS — S72143A Displaced intertrochanteric fracture of unspecified femur, initial encounter for closed fracture: Secondary | ICD-10-CM | POA: Diagnosis not present

## 2019-10-31 NOTE — Progress Notes (Signed)
Patient ID: Sara Lamb, female   DOB: 08/28/1950, 69 y.o.   MRN: [id number]

## 2019-10-31 NOTE — Progress Notes (Signed)
   Subjective:    Patient ID: Sara Lamb, female    DOB: 28-Mar-1951, 69 y.o.   MRN: [id number]  HPI     Patient is a 69 yo MWF with HTN & Insulin requiring T2_DM who was recently hospitalized Jan 24 for ORIF of a Rt hip fx. She went to [facility] for rehab and her A1c 12.9% during her stay there.     At last visit 1 month ago (Mar 3rd,  she was only taking Lantus 16 and her A1c was 7.5%.   She was instructed to increase her Lantus dose to 22 units qd and then  by 3 units every 5 days if glucoses remained over 160 mg%.  She decided to keep her Lantus dose at 16-18 units daily . List of blood sugars show most fasting glucoses are running between the high 130's and closer to 180-190's. She denies any diabetic polys, blurred vision or hypoglycemic sx's or reactions.   Review of Systems    10 point systems review negative except as above.    Objective:   Physical Exam  BP (!) 160/76   Pulse 64   Temp (!) 97 F (36.1 C)   Resp 16   Ht 4' 10"  (1.473 m)   Wt 130 lb 6.4 oz (59.1 kg)   BMI 27.25 kg/m   Recheck BP  132/83  HEENT - WNL. Neck - supple.  Chest - Clear equal BS. Cor - Nl HS. RRR w/o sig MGR. PP 1(+). No edema. MS- FROM w/o deformities.  Gait Nl. Neuro -  Nl w/o focal abnormalities.    Assessment & Plan:   1. Essential hypertension  2. Insulin-requiring or dependent type II diabetes mellitus ([place])  - Patient once again advised to increase her Lantus to 22 units  and then  by 3 units every 5 days if glucoses remained over 160 mg%. Advised to call if questions or concerns.  Between 15-20 minutes of counseling, chart review and critical decision making was performed

## 2019-11-01 ENCOUNTER — Other Ambulatory Visit: Payer: Self-pay

## 2019-11-01 ENCOUNTER — Encounter: Payer: Self-pay | Admitting: Internal Medicine

## 2019-11-01 ENCOUNTER — Other Ambulatory Visit: Payer: Self-pay | Admitting: *Deleted

## 2019-11-01 ENCOUNTER — Ambulatory Visit (INDEPENDENT_AMBULATORY_CARE_PROVIDER_SITE_OTHER): Payer: PPO | Admitting: Internal Medicine

## 2019-11-01 VITALS — BP 160/76 | HR 64 | Temp 97.0°F | Resp 16 | Ht <= 58 in | Wt 130.4 lb

## 2019-11-01 DIAGNOSIS — E119 Type 2 diabetes mellitus without complications: Secondary | ICD-10-CM

## 2019-11-01 DIAGNOSIS — I10 Essential (primary) hypertension: Secondary | ICD-10-CM

## 2019-11-01 DIAGNOSIS — Z794 Long term (current) use of insulin: Secondary | ICD-10-CM | POA: Diagnosis not present

## 2019-11-01 MED ORDER — TRAZODONE HCL 50 MG PO TABS: ORAL_TABLET | ORAL | 3 refills | Status: DC

## 2019-11-01 MED ORDER — LISINOPRIL 5 MG PO TABS: ORAL_TABLET | ORAL | 1 refills | Status: DC

## 2019-11-01 NOTE — Patient Instructions (Addendum)

## 2019-11-13 ENCOUNTER — Other Ambulatory Visit: Payer: Self-pay | Admitting: Internal Medicine

## 2019-11-13 DIAGNOSIS — Z79899 Other long term (current) drug therapy: Secondary | ICD-10-CM

## 2019-11-13 DIAGNOSIS — F419 Anxiety disorder, unspecified: Secondary | ICD-10-CM

## 2019-11-14 ENCOUNTER — Other Ambulatory Visit: Payer: Self-pay | Admitting: Internal Medicine

## 2019-11-14 DIAGNOSIS — Z8781 Personal history of (healed) traumatic fracture: Secondary | ICD-10-CM

## 2019-11-16 DIAGNOSIS — M1711 Unilateral primary osteoarthritis, right knee: Secondary | ICD-10-CM | POA: Diagnosis not present

## 2019-11-20 IMAGING — DX DG CHEST 2V
2 series · 2 of 2 positions shown · non-contrast
Comparison: 05/13/2016

CLINICAL DATA: Chest pain beginning about 7 hours ago. Shortness of
breath. Pain is left-sided.

EXAM:
CHEST - 2 VIEW

[x chest ap]
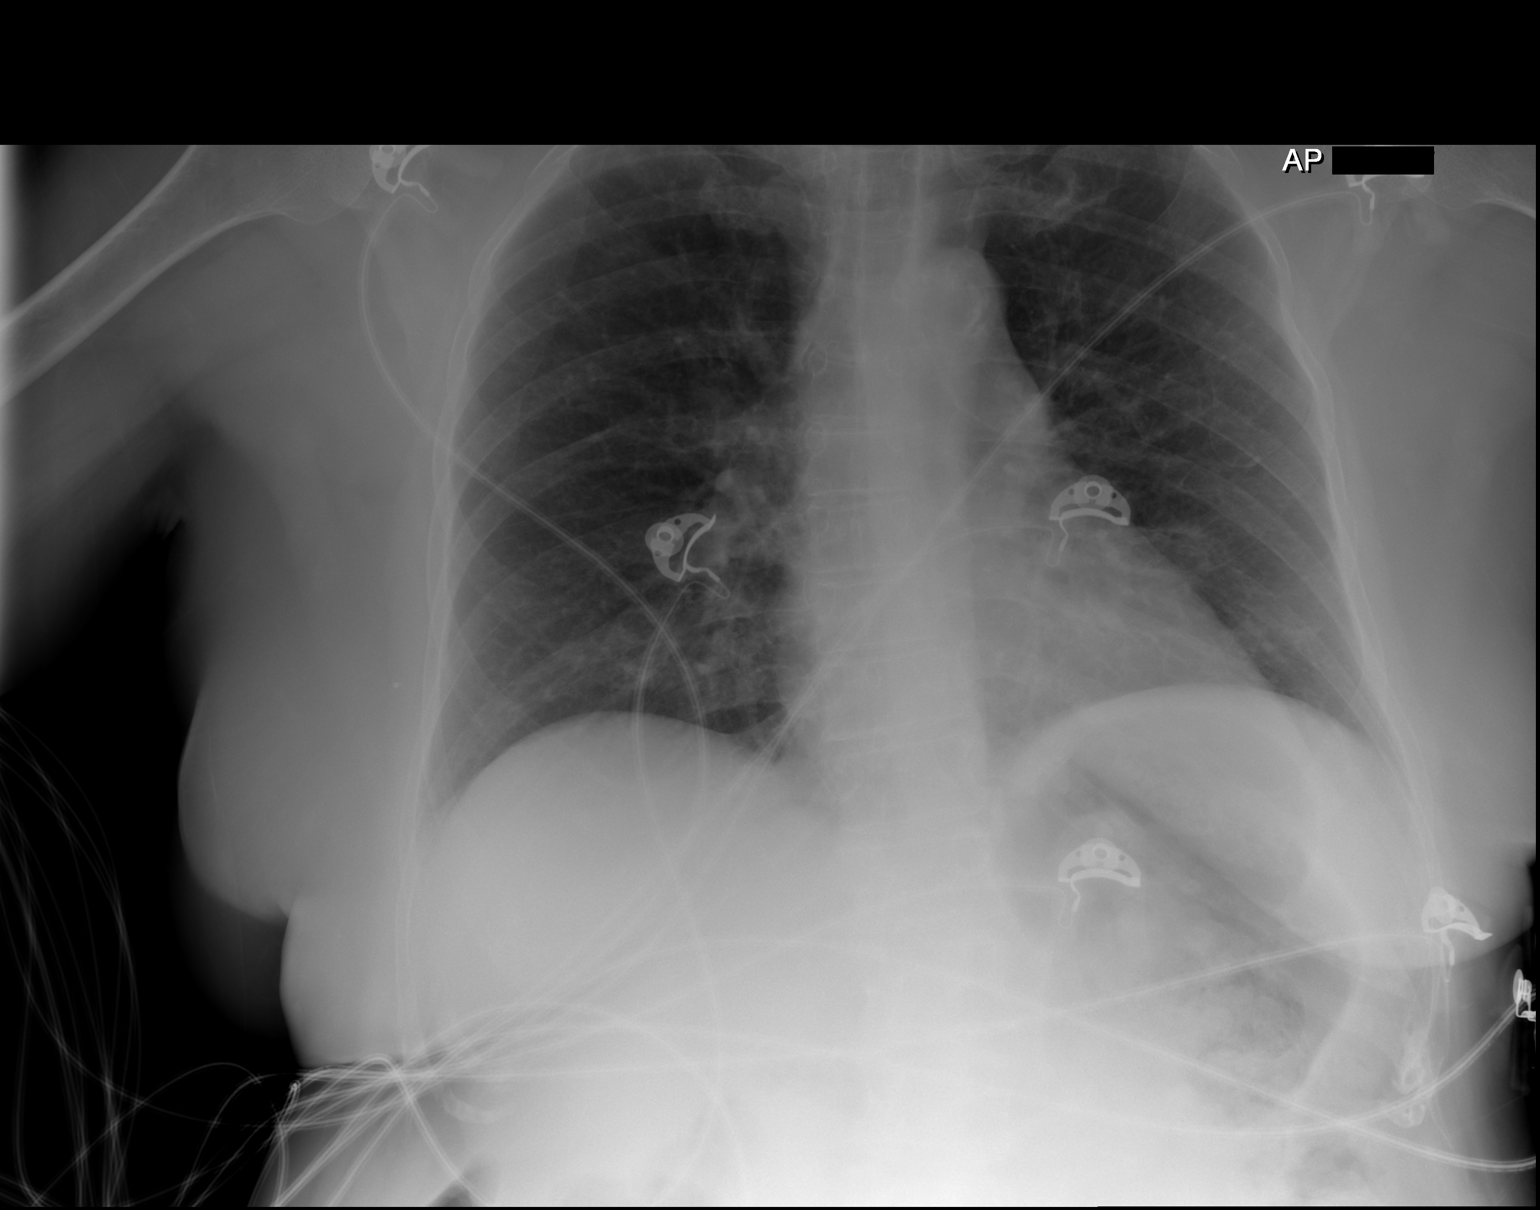

[w chest lat]
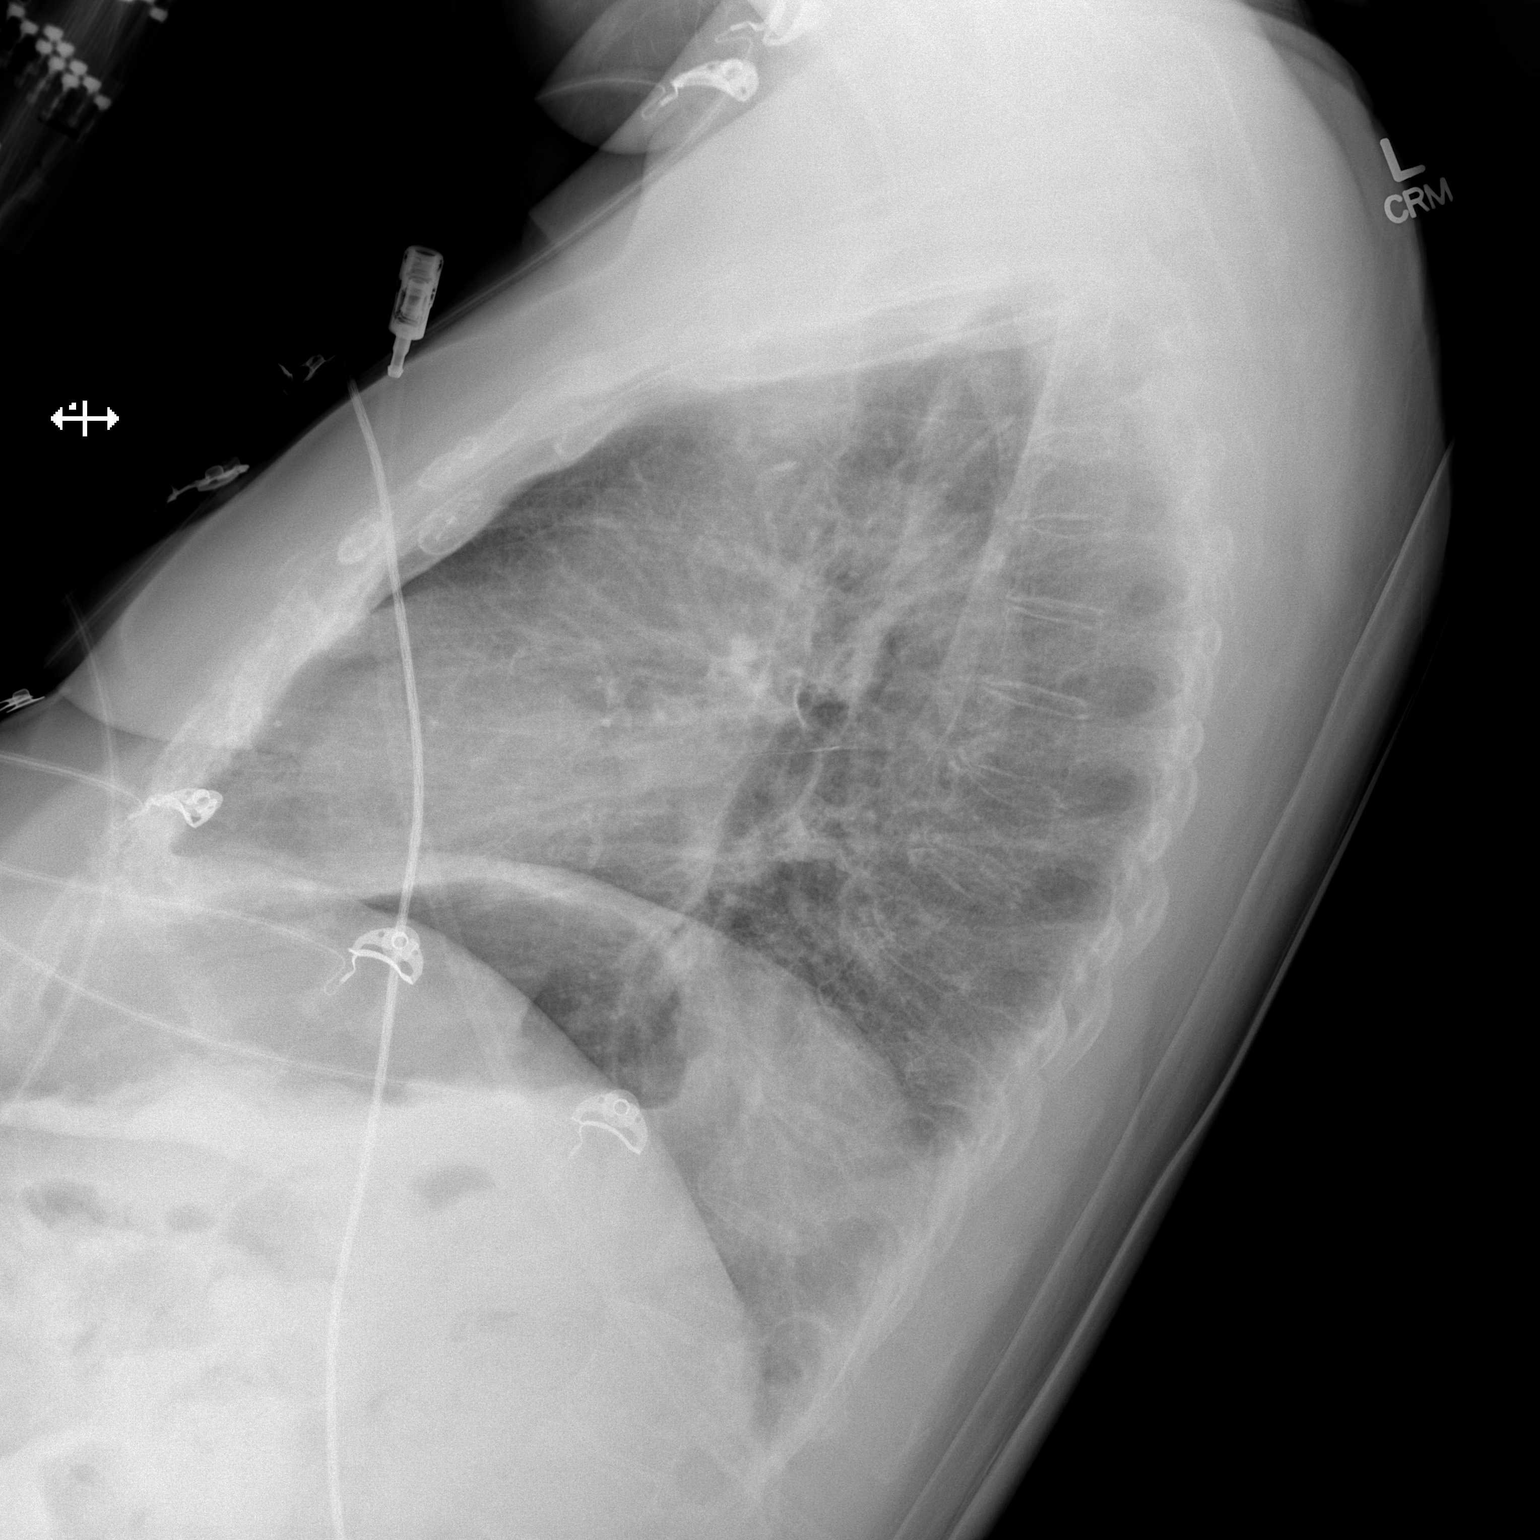

[2 of 2 positions shown; findings below may reference images not displayed]

FINDINGS: Heart size is normal. There is aortic atherosclerosis. The patient
has taken a poor inspiration. There is no evidence of infiltrate,
collapse or effusion. Question pulmonary venous hypertension. No
significant bone finding.
IMPRESSION: Poor inspiration. Question venous hypertension. No frank edema. No
focal pulmonary finding. Aortic atherosclerosis.

## 2019-11-21 DIAGNOSIS — M1711 Unilateral primary osteoarthritis, right knee: Secondary | ICD-10-CM | POA: Diagnosis not present

## 2019-12-13 ENCOUNTER — Other Ambulatory Visit: Payer: Self-pay | Admitting: Internal Medicine

## 2019-12-13 DIAGNOSIS — Z79899 Other long term (current) drug therapy: Secondary | ICD-10-CM

## 2019-12-13 DIAGNOSIS — F419 Anxiety disorder, unspecified: Secondary | ICD-10-CM

## 2019-12-15 ENCOUNTER — Telehealth: Payer: Self-pay | Admitting: *Deleted

## 2019-12-15 NOTE — Telephone Encounter (Signed)
Patient called and reported a fasting blood sugar of 86. She states she has a headache and has received some relief from Tylenol. Per Dr Melford Aase, the blood sugar # of 86 is very good and advised to continue her medications as directed. Patient was advised to eat breakfast.

## 2020-01-04 DIAGNOSIS — M1711 Unilateral primary osteoarthritis, right knee: Secondary | ICD-10-CM | POA: Diagnosis not present

## 2020-01-09 DIAGNOSIS — M25561 Pain in right knee: Secondary | ICD-10-CM | POA: Diagnosis not present

## 2020-01-17 ENCOUNTER — Other Ambulatory Visit: Payer: Self-pay | Admitting: Internal Medicine

## 2020-01-17 DIAGNOSIS — F419 Anxiety disorder, unspecified: Secondary | ICD-10-CM

## 2020-01-17 DIAGNOSIS — Z79899 Other long term (current) drug therapy: Secondary | ICD-10-CM

## 2020-01-23 DIAGNOSIS — S72143A Displaced intertrochanteric fracture of unspecified femur, initial encounter for closed fracture: Secondary | ICD-10-CM | POA: Diagnosis not present

## 2020-01-24 ENCOUNTER — Other Ambulatory Visit: Payer: Self-pay | Admitting: Internal Medicine

## 2020-01-24 DIAGNOSIS — I700 Atherosclerosis of aorta: Secondary | ICD-10-CM

## 2020-01-24 DIAGNOSIS — Z79899 Other long term (current) drug therapy: Secondary | ICD-10-CM

## 2020-02-05 ENCOUNTER — Telehealth: Payer: Self-pay | Admitting: *Deleted

## 2020-02-05 ENCOUNTER — Encounter: Payer: Self-pay | Admitting: Cardiology

## 2020-02-05 ENCOUNTER — Telehealth (INDEPENDENT_AMBULATORY_CARE_PROVIDER_SITE_OTHER): Payer: PPO | Admitting: Cardiology

## 2020-02-05 VITALS — BP 130/74 | HR 92 | Temp 97.8°F | Ht <= 58 in | Wt 128.0 lb

## 2020-02-05 DIAGNOSIS — F17200 Nicotine dependence, unspecified, uncomplicated: Secondary | ICD-10-CM

## 2020-02-05 DIAGNOSIS — E1169 Type 2 diabetes mellitus with other specified complication: Secondary | ICD-10-CM

## 2020-02-05 DIAGNOSIS — Z7189 Other specified counseling: Secondary | ICD-10-CM

## 2020-02-05 DIAGNOSIS — E785 Hyperlipidemia, unspecified: Secondary | ICD-10-CM

## 2020-02-05 DIAGNOSIS — E782 Mixed hyperlipidemia: Secondary | ICD-10-CM

## 2020-02-05 DIAGNOSIS — I2510 Atherosclerotic heart disease of native coronary artery without angina pectoris: Secondary | ICD-10-CM | POA: Diagnosis not present

## 2020-02-05 DIAGNOSIS — I10 Essential (primary) hypertension: Secondary | ICD-10-CM

## 2020-02-05 MED ORDER — ISOSORBIDE MONONITRATE ER 30 MG PO TB24
30.0000 mg | ORAL_TABLET | Freq: Every day | ORAL | 3 refills | Status: DC
Start: 2020-02-05 — End: 2020-06-24

## 2020-02-05 NOTE — Progress Notes (Signed)
Virtual Visit via Telephone Note   This visit type was conducted due to national recommendations for restrictions regarding the COVID-19 Pandemic (e.g. social distancing) in an effort to limit this patient's exposure and mitigate transmission in our community.  Due to her co-morbid illnesses, this patient is at least at moderate risk for complications without adequate follow up.  This format is felt to be most appropriate for this patient at this time.  The patient did not have access to video technology/had technical difficulties with video requiring transitioning to audio format only (telephone).  All issues noted in this document were discussed and addressed.  No physical exam could be performed with this format.  Please refer to the patient's chart for her  consent to telehealth for [facility].   Patient has given verbal permission to conduct this visit via virtual appointment and to bill insurance 02/05/2020 12:25 PM     Evaluation Performed:  Follow-up visit  Date:  02/05/2020   ID:  Sara Lamb, Sara Lamb [birth year], MRN [id number]  Patient Location: Home Provider Location: Home Office  PCP:  Unk Pinto, MD  Cardiologist:  Glenetta Hew, MD  Electrophysiologist:  None   Chief Complaint:   Chief Complaint  Patient presents with  . Follow-up    3-4 months; second post hospital follow-up  . Coronary Artery Disease    Nonocclusive-no active angina    History of Present Illness:    CHLORA MCBAIN is a 69 y.o. female with PMH notable for single-vessel CAD (borderline/nonobstructive proximal RCA treated medically) along with DM-2, hypertension, hyperlipidemia and tobacco abuse who presents via audio/video conferencing for a telehealth visit today for a 69-monthfollow-up.  I last saw her in June 2020 and she had a continued exertional dyspnea.  I increased her rosuvastatin to 3 days a week and increased Imdur to 60 mg daily.  Hospitalizations:  . January 2021-right hip  ORIF (Dr. DDonivan Scull . ->  She fell and broke her hip (likely related to UTI) - got tangled up in stuff scattered on the floor.  [facility] health PT OT. ->  2 weeks later found out that she also injured her knee (healing mild fxr). o Prior to this had been having issues with falls.-- she thinks her knees would give out & feel dizzy.  o Had been using walker for balance prior to this - but fell anyway   GTAMANIKA HEINEYwas last seen on October 11, 2019 by KJory Sims DNP ->  She was still feeling the effects of being deconditioned from her hospitalization and rehab.  Was motivated to get her motility back.  Recent - Interim CV studies:    The following studies were reviewed today: . n/a:  Inerval History   GEugenia Pancoasttells me that she is getting along okay.  She tells me that now she is pretty much using a walker continuously.  She is hoping that her knee will heal and will not require further surgery because this would basically mean that she would need hip and knee replacement surgery based on the prior ORIF surgery.  She does not seem to be having a great relationship with her surgeon, there was concerns about questions not being answered and pain not adequately controlled.  She tells me that the last visit with her orthopedic surgeon did not go well because he walked out on her when he smelled that she has been smoking again.  She had done a really good job with smoking cessation during her hospital stay and at the nursing home, but when she got home she got back into smoking unfortunately.  From a cardiac standpoint she every now and then still has her little episodes of chest discomfort for which she had been taking bisoprolol.  However, when she left the hospital and then the nursing home, her medications have been totally changed.  Her PCP evaluate the medications and told her that she could stop bisoprolol and cut her Imdur dose down to  30 mg (which are the 2 changes that I have made in her last visit-increasing to 5 mg bisoprolol and 60 mg of Imdur).  This change is probably brought on by the fact she was hypotensive during her postop visits.  Her ACE inhibitor dose is also been reduced. She was put on as needed propranolol for tremors so it is possible that bisoprolol was stopped in order to avoid overlapping beta-blockers.  As for any other cardiac symptoms, her palpitations are pretty rare.  She has skipped beats every couple days or so but notes that that is probably more related to when she is hurting from her surgery.  The chest discomfort episodes are fleeting and not necessarily associated with any activity.  She has baseline exertional dyspnea and because of deconditioning is probably getting a little worse.  No heart failure symptoms of PND orthopnea and she has swelling in the leg affected by the surgery otherwise not bilateral.  She has as needed Lasix.    Cardiovascular ROS: positive for - Off and on chest pain, not associated with activity.  She does get short of breath with activity, but is notably deconditioned.  Right foot only swelling.  Rare palpitations negative for - irregular heartbeat, orthopnea, paroxysmal nocturnal dyspnea, rapid heart rate, shortness of breath or Syncope/near syncope, TIA/amaurosis fugax, claudication.--Her falls have not been associated with presyncope, more associated with poor balance   ROS:  Please see the history of present illness.    The patient does have symptoms concerning for COVID-19 infection (fever, chills, cough, or new shortness of breath).  Review of Systems  Constitutional: Positive for malaise/fatigue (Energy level is getting better but still less than it was preop) and weight loss (Not necessarily intentional).  HENT: Negative for congestion and nosebleeds.   Respiratory: Negative for cough (Seems to have cleared up, but does have smoker's cough), shortness of breath  (Baseline) and wheezing.   Gastrointestinal: Negative for abdominal pain, blood in stool and melena.  Genitourinary: Negative for hematuria.  Musculoskeletal: Positive for falls (See HPI) and joint pain (Both hip and knee from her fall).  Neurological: Positive for dizziness (Off and on, more balance related). Negative for focal weakness.  Endo/Heme/Allergies: Positive for environmental allergies.  Psychiatric/Behavioral: Negative for depression and memory loss. The patient is nervous/anxious. The patient does not have insomnia.     The patient is practicing social distancing. *Refuses COVID-19 vaccine.  Past Medical History:  Diagnosis Date  . Anxiety   . Anxiety disorder   . Cirrhosis ([place])   . COPD (chronic obstructive pulmonary disease) ([place])    followed by pcp--  last exacerbation 11/ 2017  . Coronary artery disease, non-occlusive 03/2018   Cardiac Cath: Moderate (60%) proximal-mid RCA (FFR 0.89 => NEGATIVE / not physiologically significant).  Normal EF and EDP.  . Depression    Patient denies  . Gastritis   . History of rib fracture    2004  .  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 Hyperlipidemia   . Hypertension   . OA (osteoarthritis)    back, hips, knees, feet  . Osteoporosis   . Polyp of colon, hyperplastic   . Pulmonary nodules    per CT chest 08-28-2016  -- bilateral upper lobe nodules  . Stroke ([place])   . Tubular adenoma of colon   . Type 2 diabetes mellitus ([place])    followed by pcp--  last A1c 9.1 on 08-01-2016  . UTI (urinary tract infection)   . Wears dentures    Past Surgical History:  Procedure Laterality Date  . BILATERAL SALPINGOOPHORECTOMY  1997   via Laparoscopy  . CHOLECYSTECTOMY N/A 09/30/2016   Procedure: LAPAROSCOPIC CHOLECYSTECTOMY;  Surgeon: Mickeal Skinner, MD;  Location: [facility];  Service: General;  Laterality: N/A;  . INTRAVASCULAR PRESSURE WIRE/FFR STUDY N/A 04/12/2018   Procedure: INTRAVASCULAR PRESSURE WIRE/FFR STUDY;  Surgeon: Leonie Man, MD;  Location: [place] [facility];  Service: Cardiovascular;  Laterality: N/A;  . KNEE ARTHROSCOPY Right 11/2014  . LEFT HEART CATH AND CORONARY ANGIOGRAPHY N/A 04/12/2018   Procedure: LEFT HEART CATH AND CORONARY ANGIOGRAPHY;  Surgeon: Leonie Man, MD;  Location: [place] [facility];  Service: Cardiovascular;  Laterality: N/A;  . TRANSTHORACIC ECHOCARDIOGRAM  02/2018    Normal LV function.  EF 60 to 65%.  No RWMMA.  GRII DD.  Moderate LA dilation.  Marland Kitchen VAGINAL HYSTERECTOMY  age 42     Calcium 04/12/2018 : Moderate single-vessel disease with FFR negative lesion proximal to mid RCA.  Otherwise minimal ostial and mid LCx disease.  Prox RCA to Mid RCA lesion is 60% stenosed. FFR negative (0.89).  The left ventricular systolic function is normal. The left ventricular ejection fraction is greater than 65% by visual estimate.  LV end diastolic pressure is normal.  There is no mitral valve regurgitation with notable mitral annular calcification   Current Meds  Medication Sig  . acetaminophen (TYLENOL) 500 MG tablet Take 500 mg by mouth as directed. Take 2 tablets three times ad ay as needed  . albuterol (PROVENTIL) (2.5 MG/3ML) 0.083% nebulizer solution Take 3 mLs (2.5 mg total) by nebulization every 6 (six) hours as needed for wheezing or shortness of breath.  . ALPRAZolam (XANAX) 1 MG tablet Take 1 mg by mouth at bedtime as needed for anxiety. Take 1 tablet at bedtime and 1/2 tablet in the morning if needed ONLY if needed for Anxiety Attack & limit to 5 days /week to avoid Addiction & Dementia  . aspirin EC 81 MG tablet Take 81 mg by mouth daily.  . Cholecalciferol (VITAMIN D3) 1000 units CAPS Take 1,000 Units by mouth every Monday, Wednesday, and Friday.   .  furosemide (LASIX) 40 MG tablet Take 1/2 to 1 tablet Daily if needed ofr Fluid Retention / leg/ankle swelling  . glimepiride (AMARYL) 4 MG tablet TAKE ONE TABLET TWICE DAILY BEFORE LUNCH AND SUPPER  . insulin glargine (LANTUS) 100 UNIT/ML Solostar Pen Inject 20 Units into the skin daily.  . Insulin Pen Needle (CAREFINE PEN NEEDLES) 32G X 4 MM MISC Use 1x a day  . lisinopril (ZESTRIL) 5 MG tablet Take 1/2 tablet for blood pressure in the  evening.  . nitroGLYCERIN (NITROSTAT) 0.4 MG SL tablet Place 1 tablet (0.4 mg total) under the tongue every 5 (five) minutes as needed for chest pain.  Marland Kitchen ondansetron (ZOFRAN) 4 MG tablet Take 1 tablet (4 mg total) by mouth every 4 (four) hours as needed for nausea or vomiting.  . propranolol ER (INDERAL LA) 60 MG 24 hr capsule Take 60 mg by mouth as needed.  . rosuvastatin (CRESTOR) 20 MG tablet Take 1 tablet 3 x /week on Mon Wed & Fri  . traMADol (ULTRAM) 50 MG tablet Take 50 mg by mouth every 6 (six) hours as needed. Take 1/4 to 1 tablet as needed for pain  . traZODone (DESYREL) 50 MG tablet Take 1/2 to 1 tablet at Bedtime if needed for Sleep  . [DISCONTINUED] isosorbide mononitrate (IMDUR) 60 MG 24 hr tablet Take 30 mg by mouth daily.  Not taking Bisoprolol daily    Only uses Propranolol PRN for tremors.  Allergies:   Actos [pioglitazone], Glyburide, Metformin and related, Onglyza [saxagliptin], Insulins, Janumet [sitagliptin-metformin hcl], and Penicillins   Social History   Tobacco Use  . Smoking status: Current Every Day Smoker    Packs/day: 0.50    Years: 38.00    Pack years: 19.00    Types: Cigarettes  . Smokeless tobacco: Never Used  Vaping Use  . Vaping Use: Never used  Substance Use Topics  . Alcohol use: No    Alcohol/week: 0.0 standard drinks  . Drug use: No     Family Hx: The patient's family history includes CAD in her maternal aunt, maternal uncle, and sister; CAD (age of onset: 40) in her mother; Cancer in her father; Coronary  artery disease (age of onset: 55) in her brother; Diabetes in her brother, father, and mother; Heart attack in her maternal grandmother; Heart attack (age of onset: 72) in her sister; Hyperlipidemia in her brother, sister, and sister; Hypertension in her brother, father, and sister; Stomach cancer in her mother; Stroke in her mother. There is no history of Colon cancer, Esophageal cancer, or Rectal cancer.   Labs/Other Tests and Data Reviewed:    EKG:  No ECG reviewed.  Recent Labs: 09/27/2019: ALT 12; BUN 10; Creat 0.70; Hemoglobin 13.9; Magnesium 1.8; Platelets 290; Potassium 3.9; Sodium 134; TSH 0.69   Recent Lipid Panel Lab Results  Component Value Date/Time   CHOL 129 09/27/2019 10:36 AM   TRIG 154 (H) 09/27/2019 10:36 AM   HDL 36 (L) 09/27/2019 10:36 AM   CHOLHDL 3.6 09/27/2019 10:36 AM   LDLCALC 69 09/27/2019 10:36 AM    Wt Readings from Last 3 Encounters:  02/05/20 128 lb (58.1 kg)  11/01/19 130 lb 6.4 oz (59.1 kg)  10/11/19 135 lb (61.2 kg)     Objective:    Vital Signs:  BP 130/74   Pulse 92   Temp 97.8 F (36.6 C)   Ht 4' 10"  (1.473 m)   Wt 128 lb (58.1 kg)   BMI 26.75 kg/m   VITAL SIGNS:  reviewed Pleasant female in no acute distress. A&O x 3.  normal Mood & Affect (but does seem "bummed" by needing walker full time) Non-labored respirations   ASSESSMENT & PLAN:    Problem List Items Addressed This Visit    Essential hypertension (Chronic)    Interesting, her blood pressure now must be lower than it had been last year.  Most of her medications have been reduced.  She is no longer on the bisoprolol and is on low-dose  lisinopril as opposed to 10 mg.  For now, would continue current medications including standing dose of lisinopril and as needed Inderal.      Relevant Medications   propranolol ER (INDERAL LA) 60 MG 24 hr capsule   isosorbide mononitrate (IMDUR) 30 MG 24 hr tablet   Hyperlipidemia associated with type 2 diabetes mellitus ([place]) (Chronic)     Labs were just checked in March.  Look pretty good on current dose of statin-20 mg 3 times a week.  No change.      Relevant Medications   insulin glargine (LANTUS) 100 UNIT/ML Solostar Pen   Coronary artery disease, non-occlusive: RCA 60%, FFR Negative. - Primary (Chronic)    Moderate RCA disease, not unexpectedly FFR negative.  I think her episodes of intermittent chest discomfort is probably not cardiac in nature.  Has not really had that much of a benefit from being on or off of Imdur.  Was restarted on 30 mg of Imdur which we will continue current dose.  I started her on a beta-blocker (bisoprolol) which has been discontinued, she has been using it as needed for chest discomfort.  I also see that she has as needed propranolol 60 mg tablet written for.  In order to simplify things I am fine with stopping bisoprolol while using propranolol as this is prescribed as much for palpitations CAD, hypertension as it is for her tremor.  Bisoprolol is not as effective for tremor. --> Low threshold to continue standing dose of Inderal/propranolol (24-hour capsule) She is on reduced ACE inhibitor dose.  Taking 20 mg of statin 3 times a week. On baby aspirin.      Relevant Medications   propranolol ER (INDERAL LA) 60 MG 24 hr capsule   isosorbide mononitrate (IMDUR) 30 MG 24 hr tablet   Tobacco use disorder (Chronic)    Smoking cessation instruction/counseling given -3-5 minutes:  counseled patient on the dangers of tobacco use, advised patient to stop smoking, and reviewed strategies to maximize success      Hyperlipidemia, mixed (Chronic)   Relevant Medications   propranolol ER (INDERAL LA) 60 MG 24 hr capsule   isosorbide mononitrate (IMDUR) 30 MG 24 hr tablet   Educated about COVID-19 virus infection and vaccine    We talked for a about 10 minutes about her concerns about Covid vaccination.  She has never been on to get vaccines including pneumonia and flu vaccines.  She says that she hears  things on television about bad outcomes.  I explained to her that anytime there is a bad outcome there is a lot of press about it, however there is no press about the 20+ million injections that have already been given without side effects.  I informed her that she is at a higher risk for bad outcomes from Covid because of her smoking, deconditioning and underlying heart disease.         COVID-19 Education: The signs and symptoms of COVID-19 were discussed with the patient and how to seek care for testing (follow up with PCP or arrange E-visit).   The importance of social distancing as well as consideration of vaccine was discussed today.  Time:   Today, I have spent 72mnutes with the patient with telehealth technology discussing the above problems.  10 min of charting.   Medication Adjustments/Labs and Tests Ordered: Current medicines are reviewed at length with the patient today.  Concerns regarding medicines are outlined above.   Patient Instructions  Medication Instructions:  -- Stop  BISOPROLOL  You have a Rx for Propranolol to use as needed for Tremor - you can also use this as needed for you chest discomfort (instead of Bisoprolol).  *If you need a refill on your cardiac medications before your next appointment, please call your pharmacy*   Lab Work:  With PCP  If you have labs (blood work) drawn today and your tests are completely normal, you will receive your results only by: Marland Kitchen MyChart Message (if you have MyChart) OR . A paper copy in the mail If you have any lab test that is abnormal or we need to change your treatment, we will call you to review the results.   Testing/Procedures: N/a for now   Follow-Up: At [facility], you and your health needs are our priority.  As part of our continuing mission to provide you with exceptional heart care, we have created designated Provider Care Teams.  These Care Teams include your primary Cardiologist (physician) and  Advanced Practice Providers (APPs -  Physician Assistants and Nurse Practitioners) who all work together to provide you with the care you need, when you need it.  We recommend signing up for the patient portal called "MyChart".  Sign up information is provided on this After Visit Summary.  MyChart is used to connect with patients for Virtual Visits (Telemedicine).  Patients are able to view lab/test results, encounter notes, upcoming appointments, etc.  Non-urgent messages can be sent to your provider as well.   To learn more about what you can do with MyChart, go to [email or web address].    Your next appointment:   6 month(s)  The format for your next appointment:   In Person  Provider:   Glenetta Hew, MD   Other Instructions Really think about COVID VACCINE ALSO ABOUT USING NICOTINE PATCHES TO STOP SMOKING    Signed, Glenetta Hew, MD  02/05/2020 12:25 PM    [place]

## 2020-02-05 NOTE — Telephone Encounter (Signed)
  Patient Consent for Virtual Visit         ORTHA METTS has provided verbal consent on 02/05/2020 for a virtual visit (video or telephone).   CONSENT FOR VIRTUAL VISIT FOR:  Sara Lamb  By participating in this virtual visit I agree to the following:  I hereby voluntarily request, consent and authorize [facility] and its employed or contracted physicians, physician assistants, nurse practitioners or other licensed health care professionals (the Practitioner), to provide me with telemedicine health care services (the "Services") as deemed necessary by the treating Practitioner. I acknowledge and consent to receive the Services by the Practitioner via telemedicine. I understand that the telemedicine visit will involve communicating with the Practitioner through live audiovisual communication technology and the disclosure of certain medical information by electronic transmission. I acknowledge that I have been given the opportunity to request an in-person assessment or other available alternative prior to the telemedicine visit and am voluntarily participating in the telemedicine visit.  I understand that I have the right to withhold or withdraw my consent to the use of telemedicine in the course of my care at any time, without affecting my right to future care or treatment, and that the Practitioner or I may terminate the telemedicine visit at any time. I understand that I have the right to inspect all information obtained and/or recorded in the course of the telemedicine visit and may receive copies of available information for a reasonable fee.  I understand that some of the potential risks of receiving the Services via telemedicine include:  Marland Kitchen Delay or interruption in medical evaluation due to technological equipment failure or disruption; . Information transmitted may not be sufficient (e.g. poor resolution of images) to allow for appropriate medical decision making by the Practitioner;  and/or  . In rare instances, security protocols could fail, causing a breach of personal health information.  Furthermore, I acknowledge that it is my responsibility to provide information about my medical history, conditions and care that is complete and accurate to the best of my ability. I acknowledge that Practitioner's advice, recommendations, and/or decision may be based on factors not within their control, such as incomplete or inaccurate data provided by me or distortions of diagnostic images or specimens that may result from electronic transmissions. I understand that the practice of medicine is not an exact science and that Practitioner makes no warranties or guarantees regarding treatment outcomes. I acknowledge that a copy of this consent can be made available to me via my patient portal ([place]), or I can request a printed copy by calling the office of [facility].    I understand that my insurance will be billed for this visit.   I have read or had this consent read to me. . I understand the contents of this consent, which adequately explains the benefits and risks of the Services being provided via telemedicine.  . I have been provided ample opportunity to ask questions regarding this consent and the Services and have had my questions answered to my satisfaction. . I give my informed consent for the services to be provided through the use of telemedicine in my medical care

## 2020-02-05 NOTE — Assessment & Plan Note (Signed)
Moderate RCA disease, not unexpectedly FFR negative.  I think her episodes of intermittent chest discomfort is probably not cardiac in nature.  Has not really had that much of a benefit from being on or off of Imdur.  Was restarted on 30 mg of Imdur which we will continue current dose.  I started her on a beta-blocker (bisoprolol) which has been discontinued, she has been using it as needed for chest discomfort.  I also see that she has as needed propranolol 60 mg tablet written for.  In order to simplify things I am fine with stopping bisoprolol while using propranolol as this is prescribed as much for palpitations CAD, hypertension as it is for her tremor.  Bisoprolol is not as effective for tremor. --> Low threshold to continue standing dose of Inderal/propranolol (24-hour capsule) She is on reduced ACE inhibitor dose.  Taking 20 mg of statin 3 times a week. On baby aspirin.

## 2020-02-05 NOTE — Assessment & Plan Note (Signed)
Labs were just checked in March.  Look pretty good on current dose of statin-20 mg 3 times a week.  No change.

## 2020-02-05 NOTE — Assessment & Plan Note (Signed)
We talked for a about 10 minutes about her concerns about Covid vaccination.  She has never been on to get vaccines including pneumonia and flu vaccines.  She says that she hears things on television about bad outcomes.  I explained to her that anytime there is a bad outcome there is a lot of press about it, however there is no press about the 20+ million injections that have already been given without side effects.  I informed her that she is at a higher risk for bad outcomes from Covid because of her smoking, deconditioning and underlying heart disease.

## 2020-02-05 NOTE — Assessment & Plan Note (Signed)
Interesting, her blood pressure now must be lower than it had been last year.  Most of her medications have been reduced.  She is no longer on the bisoprolol and is on low-dose lisinopril as opposed to 10 mg.  For now, would continue current medications including standing dose of lisinopril and as needed Inderal.

## 2020-02-05 NOTE — Assessment & Plan Note (Deleted)
Labs were just checked in March.  Look pretty good on current dose of statin.  No change.

## 2020-02-05 NOTE — Patient Instructions (Addendum)
Medication Instructions:  -- Stop BISOPROLOL  You have a Rx for Propranolol to use as needed for Tremor - you can also use this as needed for you chest discomfort (instead of Bisoprolol).  *If you need a refill on your cardiac medications before your next appointment, please call your pharmacy*   Lab Work:  With PCP  If you have labs (blood work) drawn today and your tests are completely normal, you will receive your results only by:  [place] (if you have MyChart) OR  A paper copy in the mail If you have any lab test that is abnormal or we need to change your treatment, we will call you to review the results.   Testing/Procedures: N/a for now   Follow-Up: At [facility], you and your health needs are our priority.  As part of our continuing mission to provide you with exceptional heart care, we have created designated Provider Care Teams.  These Care Teams include your primary Cardiologist (physician) and Advanced Practice Providers (APPs -  Physician Assistants and Nurse Practitioners) who all work together to provide you with the care you need, when you need it.  We recommend signing up for the patient portal called "MyChart".  Sign up information is provided on this After Visit Summary.  MyChart is used to connect with patients for Virtual Visits (Telemedicine).  Patients are able to view lab/test results, encounter notes, upcoming appointments, etc.  Non-urgent messages can be sent to your provider as well.   To learn more about what you can do with MyChart, go to [email or web address].    Your next appointment:   6 month(s)  The format for your next appointment:   In Person  Provider:   Glenetta Hew, MD   Other Instructions Really think about COVID VACCINE ALSO ABOUT USING NICOTINE PATCHES TO STOP SMOKING

## 2020-02-05 NOTE — Assessment & Plan Note (Signed)
Smoking cessation instruction/counseling given -3-5 minutes:  counseled patient on the dangers of tobacco use, advised patient to stop smoking, and reviewed strategies to maximize success

## 2020-02-05 NOTE — Telephone Encounter (Signed)
RN spoke to patient. Instruction were given  from today's virtual visit 02/14/20 .  AVS SUMMARY has been mailed to patient  .   Patient verbalized understanding

## 2020-02-26 ENCOUNTER — Other Ambulatory Visit: Payer: Self-pay | Admitting: Adult Health

## 2020-03-12 ENCOUNTER — Ambulatory Visit (INDEPENDENT_AMBULATORY_CARE_PROVIDER_SITE_OTHER): Payer: PPO | Admitting: Physician Assistant

## 2020-03-12 ENCOUNTER — Other Ambulatory Visit: Payer: Self-pay

## 2020-03-12 ENCOUNTER — Encounter: Payer: Self-pay | Admitting: Physician Assistant

## 2020-03-12 VITALS — BP 132/88 | HR 83 | Temp 97.3°F | Ht <= 58 in | Wt 133.2 lb

## 2020-03-12 DIAGNOSIS — E559 Vitamin D deficiency, unspecified: Secondary | ICD-10-CM

## 2020-03-12 DIAGNOSIS — E1169 Type 2 diabetes mellitus with other specified complication: Secondary | ICD-10-CM | POA: Diagnosis not present

## 2020-03-12 DIAGNOSIS — E1165 Type 2 diabetes mellitus with hyperglycemia: Secondary | ICD-10-CM

## 2020-03-12 DIAGNOSIS — E1151 Type 2 diabetes mellitus with diabetic peripheral angiopathy without gangrene: Secondary | ICD-10-CM

## 2020-03-12 DIAGNOSIS — Z0001 Encounter for general adult medical examination with abnormal findings: Secondary | ICD-10-CM

## 2020-03-12 DIAGNOSIS — F419 Anxiety disorder, unspecified: Secondary | ICD-10-CM

## 2020-03-12 DIAGNOSIS — R609 Edema, unspecified: Secondary | ICD-10-CM

## 2020-03-12 DIAGNOSIS — E538 Deficiency of other specified B group vitamins: Secondary | ICD-10-CM

## 2020-03-12 DIAGNOSIS — K7460 Unspecified cirrhosis of liver: Secondary | ICD-10-CM

## 2020-03-12 DIAGNOSIS — E871 Hypo-osmolality and hyponatremia: Secondary | ICD-10-CM

## 2020-03-12 DIAGNOSIS — I700 Atherosclerosis of aorta: Secondary | ICD-10-CM | POA: Diagnosis not present

## 2020-03-12 DIAGNOSIS — E785 Hyperlipidemia, unspecified: Secondary | ICD-10-CM | POA: Diagnosis not present

## 2020-03-12 DIAGNOSIS — Z136 Encounter for screening for cardiovascular disorders: Secondary | ICD-10-CM

## 2020-03-12 DIAGNOSIS — I10 Essential (primary) hypertension: Secondary | ICD-10-CM | POA: Diagnosis not present

## 2020-03-12 DIAGNOSIS — J449 Chronic obstructive pulmonary disease, unspecified: Secondary | ICD-10-CM

## 2020-03-12 DIAGNOSIS — Z0000 Encounter for general adult medical examination without abnormal findings: Secondary | ICD-10-CM | POA: Diagnosis not present

## 2020-03-12 DIAGNOSIS — K7581 Nonalcoholic steatohepatitis (NASH): Secondary | ICD-10-CM

## 2020-03-12 DIAGNOSIS — F09 Unspecified mental disorder due to known physiological condition: Secondary | ICD-10-CM

## 2020-03-12 DIAGNOSIS — I2510 Atherosclerotic heart disease of native coronary artery without angina pectoris: Secondary | ICD-10-CM

## 2020-03-12 DIAGNOSIS — Z9114 Patient's other noncompliance with medication regimen: Secondary | ICD-10-CM

## 2020-03-12 DIAGNOSIS — M810 Age-related osteoporosis without current pathological fracture: Secondary | ICD-10-CM

## 2020-03-12 DIAGNOSIS — F17200 Nicotine dependence, unspecified, uncomplicated: Secondary | ICD-10-CM

## 2020-03-12 MED ORDER — GABAPENTIN 100 MG PO CAPS
100.0000 mg | ORAL_CAPSULE | Freq: Three times a day (TID) | ORAL | 1 refills | Status: DC
Start: 2020-03-12 — End: 2021-01-14

## 2020-03-12 NOTE — Progress Notes (Signed)
CPE and 3 MONTH  Assessment:    Essential hypertension - continue medications, DASH diet, exercise and monitor at home. Call if greater than 130/80.  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 Advised to quit smoking - Hemoglobin A1c  Chronic obstructive pulmonary disease, unspecified COPD type ([place]) Advised to quit smoking.    Swelling Likely from injury States lasix 40 mg is not helping Will check BMP and may increase this AS needed to higher dose No redness/warmth  T2_NIDDM, poorly controlled Discussed general issues about diabetes pathophysiology and management., Educational material distributed., Suggested low cholesterol diet., Encouraged aerobic exercise., Discussed foot care., Reminded to get yearly retinal exam. - Hemoglobin Q4B   Nonalcoholic hepatosteatosis Weight loss advised, monitor sugars.  - Hepatic function panel  Atherosclerosis of abdominal aorta (HCC) Control blood pressure, cholesterol, glucose, increase exercise.  Advised to stop smoking - Lipid panel - Hemoglobin A1c   Hyperlipidemia, unspecified hyperlipidemia type Not willing to get on medication, will check cholesterol and attempt - Lipid panel  Tobacco use disorder Smoking cessation-  instruction/counseling given, counseled patient on the dangers of tobacco use, advised patient to stop smoking, and reviewed strategies to maximize success, continue chantix    Vitamin D deficiency Continue supplement   Medication management - Magnesium   Anxiety  Poor compliance with medication LONG discussion about compliance, needs to stay on  medication  Osteoporosis, unspecified osteoporosis type, unspecified pathological fracture presence Has had DEXA, declines treatment  Encounter for general adult medical examination with abnormal findings  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 SEE EYE DOCTOR  Liver cirrhosis secondary to NASH ([place]) -     Hepatic function panel  BMI 29.0-29.9,adult  Overweight  - long discussion about weight loss, diet, and exercise -recommended diet heavy in fruits and veggies and low in animal meats, cheeses, and dairy products  Hyperlipidemia associated with type 2 diabetes mellitus (HCC) check lipids decrease fatty foods increase activity.   T2_NIDDM, poorly controlled -     Hemoglobin A1c - increase basal insulin based off fasting sugars.   Cognitive dysfunction Get better control of sugars.    Over 40 minutes of exam, counseling, chart review and critical decision making was performed Future Appointments  Date Time Provider [place]  03/13/2021  3:00 PM Vicie Mutters, PA-C [facility] None     Subjective:  CHIRSTINE Lamb is a 69 y.o. female who presents for CPE and 3 month follow up for uncontrolled DM.   She is very noncompliant with test, vaccines, and has an aversion to medication with " reactions" to the majority of medications.   She is s/p fall in Jan 2021 and fracture to her hip, continues to use walker. She is on gabapentin 149m that is helping.  CT chest 10/2017  She is on xanax 1 pill every night due to sleep and she will take another half due to stress and shaking. She is doing all the driving and has lot of stress with Sara Lamb she is asking for more xanax or a higher dose.   She  has cirrhosis due to NASH, has  RUQ pain, following with GI,.EGD 01/2017, no varices.    Her blood pressure has not been controlled at home, today their BP is BP: 132/88  Had cath 2019 showed Prox RCA to Mid RCA lesion is 60% stenosed. FFR negative- following with Dr. Ellyn Hack.   She does not workout. She denies chest pain, shortness of breath, dizziness. She has COPD, continues to smoke.    BMI is Body mass index is 27.84 kg/m., she is working on diet and exercise. Wt Readings from Last 3 Encounters:  03/12/20 133 lb 3.2 oz (60.4 kg)  02/05/20 128 lb (58.1 kg)  11/01/19 130 lb 6.4 oz (59.1 kg)    She is not on cholesterol medication . Her cholesterol is not at goal. The cholesterol last visit was:  She is suppose to be on crestor but she has a reaction and could not.  Lab Results  Component Value Date   CHOL 129 09/27/2019   HDL 36 (L) 09/27/2019   LDLCALC 69 09/27/2019   TRIG 154 (H) 09/27/2019   CHOLHDL 3.6 09/27/2019   She has not been working on diet and exercise for Diabetes  with diabetic chronic kidney disease she is on ACE/ARB- she is only on 1/2 at this time With hyperlipidemia on crestor  she is not on bASA  she is on amaryl 4 mg BID- she does not eat until 1 pm, she will take glimepiride with lunch and with dinner. she is on lantus 20 units a day with some low sugars in the morning and denies polydipsia, polyuria and visual disturbances. Last A1C was:  Lab Results  Component Value Date   HGBA1C 7.5 (H) 09/27/2019   Last GFR: Lab Results  Component Value Date   GFRNONAA 89 09/27/2019   Patient is on Vitamin D supplement.   Lab Results  Component Value Date   VD25OH 18 (L) 09/27/2019     BMI is Body mass index is 27.84 kg/m., she is working on diet and exercise. Wt Readings from Last 3 Encounters:  03/12/20 133 lb 3.2 oz (60.4 kg)  02/05/20 128 lb (58.1 kg)  11/01/19 130 lb 6.4 oz (59.1 kg)     Medication Review: Current Outpatient Medications on File Prior to Visit  Medication Sig   . acetaminophen (TYLENOL) 500 MG tablet Take 500 mg by mouth as directed. Take 2 tablets three times ad ay as needed  . albuterol (PROVENTIL) (2.5 MG/3ML) 0.083% nebulizer solution Take 3 mLs (2.5 mg total) by nebulization every 6 (six) hours as needed for wheezing or shortness of breath.  . ALPRAZolam (XANAX) 1 MG tablet Take 1/2 -1 tablet 2-3 times daily ONLY if needed for Anxiety Attack & limit to 5 days /week to avoid Addiction & Dementia  . aspirin EC 81 MG tablet Take 81 mg by mouth daily.  . Cholecalciferol (VITAMIN D3) 1000 units CAPS Take 1,000 Units by mouth every Monday, Wednesday, and Friday.   . furosemide (LASIX) 40 MG tablet Take 1/2 to 1 tablet Daily if needed ofr Fluid Retention / leg/ankle swelling  . glimepiride (AMARYL) 4 MG tablet TAKE ONE TABLET TWICE DAILY BEFORE LUNCH AND SUPPER  . insulin glargine (LANTUS) 100 UNIT/ML Solostar Pen Inject 20 Units into the skin daily.  . Insulin Pen Needle (CAREFINE PEN NEEDLES) 32G X 4 MM MISC Use 1x a day  . isosorbide mononitrate (IMDUR) 30 MG 24 hr tablet Take 1 tablet (30 mg total) by mouth daily.  Marland Kitchen  lisinopril (ZESTRIL) 5 MG tablet Take 1/2 tablet for blood pressure in the evening.  . nitroGLYCERIN (NITROSTAT) 0.4 MG SL tablet Place 1 tablet (0.4 mg total) under the tongue every 5 (five) minutes as needed for chest pain.  Marland Kitchen ondansetron (ZOFRAN) 4 MG tablet Take 1 tablet (4 mg total) by mouth every 4 (four) hours as needed for nausea or vomiting.  . propranolol ER (INDERAL LA) 60 MG 24 hr capsule Take 60 mg by mouth as needed.  . rosuvastatin (CRESTOR) 20 MG tablet Take 1 tablet 3 x /week on Mon Wed & Fri  . traMADol (ULTRAM) 50 MG tablet Take 50 mg by mouth every 6 (six) hours as needed. Take 1/4 to 1 tablet as needed for pain  . traZODone (DESYREL) 50 MG tablet Take 1/2 to 1 tablet at Bedtime if needed for Sleep   No current facility-administered medications on file prior to visit.     Allergies  Allergen Reactions  . Actos  [Pioglitazone] Other (See Comments)    Patient stated that it elevated her BGL  . Glyburide Other (See Comments)    Patient stated that it elevated her BGL  . Metformin And Related     "metformin caused mini-strokes" and ELEVATED BS and also GLYBURIDE, ONGLYZA, ACTOS, INVOKANA caused elevated BS  . Onglyza [Saxagliptin] Other (See Comments)    Patient stated that it elevated her BGL  . Insulins Rash  . Janumet [Sitagliptin-Metformin Hcl] Rash and Other (See Comments)    Shakes/ rash  . Penicillins Rash and Other (See Comments)    Current Problems (verified) Patient Active Problem List   Diagnosis Date Noted  . Educated about COVID-19 virus infection and vaccine 02/05/2020  . S/P right hip fracture 09/06/2019  . Hyperlipidemia, mixed 11/24/2018  . T2_NIDDM, poorly controlled 11/24/2018  . Chronic hyponatremia 03/18/2018  . Coronary artery disease, non-occlusive: RCA 60%, FFR Negative. 02/16/2018  . Cognitive dysfunction 03/27/2017  . Liver cirrhosis secondary to NASH ([place]) 11/23/2016  . Osteoporosis 05/27/2016  . Atherosclerosis of abdominal aorta ([place]) 04/30/2016  . Type 2 diabetes mellitus with atherosclerosis of aorta ([place]) 04/30/2015  . COPD (chronic obstructive pulmonary disease) ([place]) 04/30/2015  . Tobacco use disorder 04/08/2015  . Poor compliance with medication 11/05/2014  . Vitamin D deficiency   . Anxiety   . Essential hypertension   . Hyperlipidemia associated with type 2 diabetes mellitus ([place])     Screening Tests Immunization History  Administered Date(s) Administered  . Hep A / Hep B 01/07/2017, 02/08/2017, 07/09/2017  . Td 06/26/2001  . Tdap 04/30/2015    Preventative care: Last colonoscopy: 2018 Last mammogram: 05/25/2016 OVERDUE Last pap smear/pelvic exam: declines  DEXA: 04/2016 MRI brain 2016 CT chest 10/2017 IMPRESSION: 1. Multiple scattered millimetric peripheral pulmonary nodules are unchanged and considered benign. 2. Aortic  atherosclerosis (ICD10-170.0). Three-vessel coronary artery calcification. 3. Cirrhosis.  Prior vaccinations: DECLINES ALL VACCINES TD or Tdap:2016   Names of Other Physician/Practitioners you currently use: 1. [facility] here for primary care 2. none, eye doctor, encouraged to see eye doctor 3. Dentures dentist Patient Care Team: Unk Pinto, MD as PCP - General (Internal Medicine) Leonie Man, MD as PCP - Cardiology (Cardiology) Kinsinger, Arta Bruce, MD as Consulting Physician (General Surgery) Philemon Kingdom, MD as Consulting Physician (Internal Medicine)  SURGICAL HISTORY She  has a past surgical history that includes Vaginal hysterectomy (age 53); Bilateral salpingoophorectomy (1997); Knee arthroscopy (Right, 11/2014); Cholecystectomy (N/A, 09/30/2016); LEFT HEART CATH AND  CORONARY ANGIOGRAPHY (N/A, 04/12/2018); INTRAVASCULAR PRESSURE WIRE/FFR STUDY (N/A, 04/12/2018); and transthoracic echocardiogram (02/2018). FAMILY HISTORY Her family history includes CAD in her maternal aunt, maternal uncle, and sister; CAD (age of onset: 31) in her mother; Cancer in her father; Coronary artery disease (age of onset: 42) in her brother; Diabetes in her brother, father, and mother; Heart attack in her maternal grandmother; Heart attack (age of onset: 56) in her sister; Hyperlipidemia in her brother, sister, and sister; Hypertension in her brother, father, and sister; Stomach cancer in her mother; Stroke in her mother. SOCIAL HISTORY She  reports that she has been smoking cigarettes. She has a 19.00 pack-year smoking history. She has never used smokeless tobacco. She reports that she does not drink alcohol and does not use drugs. Does Patient Have a Medical Advance Directive?: Yes Type of Advance Directive: Healthcare Power of Attorney, Living will [place] in Chart?: Yes - validated most recent copy scanned in chart (See row  information)    Objective:     Today's Vitals   03/12/20 1517  BP: 132/88  Pulse: 83  Temp: (!) 97.3 F (36.3 C)  SpO2: 99%  Weight: 133 lb 3.2 oz (60.4 kg)  Height: 4' 10"  (1.473 m)  PainSc: 8   PainLoc: Hip   Body mass index is 27.84 kg/m.  General appearance: alert, no distress, WD/WN, female HEENT: normocephalic, sclerae anicteric, TMs pearly, nares patent, no discharge or erythema, pharynx normal.  Oral cavity: MMM, no lesions Neck: supple, no lymphadenopathy, no thyromegaly, no masses Heart: RRR, normal S1, S2, holosystolic murmur Lungs: decreased BS, with diffuse wheezing and rhonchi bilateral, no rales Abdomen: +bs, soft, obese, tender epigastric and RUQ pain to palpation,  no masses, no hepatomegaly, no splenomegaly Musculoskeletal: nontender, no swelling, no obvious deformity Extremities: 1-2+ edema, slightly worse on right than left, no hard cord, no erythema, neg homen's, no cyanosis, no clubbing Pulses: 2+ symmetric, upper and lower extremities, normal cap refill Neurological: alert, oriented x 3, CN2-12 intact, strength normal upper extremities and lower extremities, sensation normal throughout, DTRs 2+ throughout, no cerebellar signs, gait antalgic with walker Psychiatric: normal affect, behavior normal, pleasant   Future Appointments  Date Time Provider [place]  06/24/2020  3:00 PM Vicie Mutters, PA-C [facility] None  03/13/2021  3:00 PM Vicie Mutters, PA-C [facility] None    Vicie Mutters, PA-C   03/12/2020

## 2020-03-12 NOTE — Patient Instructions (Addendum)
We are starting you out on a Basal insulin. This insulin ONLY affects your morning sugars.   This insulin provides blood sugar control for up to 24 hours.     If blood sugar is under 90 for 2 days in a row in the morning, reduce the dose by 2 units from 20 to18 units. Note that this insulin does not control the rise of blood sugar with meals     Please remember only take the insulin WITH food, if you are sick or unable to eat DO NOT take your insulin. Also a low blood sugar is much more dangerous than a high blood sugar. Your brain needs 2 things, oxygen and sugar, so lets make sure it gets both. If at any time you have a question or concern, call the office or message [place] in [place].    Your A1C is a measure of your sugar over the past 3 months and is not affected by what you have eaten over the past few days. Diabetes increases your chances of stroke and heart attack over 300 % and is the leading cause of blindness and kidney failure in the [place]. Please make sure you decrease bad carbs like white bread, white rice, potatoes, corn, soft drinks, pasta, cereals, refined sugars, sweet tea, dried fruits, and fruit juice. Good carbs are okay to eat in moderation like sweet potatoes, brown rice, whole grain pasta/bread, most fruit (except dried fruit) and you can eat as many veggies as you want.   Greater than 6.5 is considered diabetic. Between 6.4 and 5.7 is prediabetic If your A1C is less than 5.7 you are NOT diabetic.  Targets for Glucose Readings: Time of Check Target for patients WITHOUT Diabetes Target for DIABETICS  Before Meals Less than 100  less than 150  Two hours after meals Less than 200  Less than 250    If your morning sugar is always below 120 but your A1C is still elevated then the issue is with your sugar spiking after meals. Try to take your blood sugar approximately 2 hours after eating, this number should be less than 200. If it is not, think about the foods that you ate  and better choices you can make. '  VENOUS INSUFFICIENCY Our lower leg venous system is not the most reliable, the heart does NOT pump fluid up, there is a valve system.  The muscles of the leg squeeze and the blood moves up and a valve opens and close, then they squeeze, blood moves up and valves open and closes keeping the blood moving towards the heart.  Lots can go wrong with this valve system.  If someone is sitting or standing without movement, everyone will get swelling.  THINGS TO DO:  Do not stand or sit in one position for long periods of time. Do not sit with your legs crossed. Rest with your legs raised during the day.  Your legs have to be higher than your heart so that gravity will force the valves to open, so please really elevate your legs.   Wear elastic stockings or support hose. Do not wear other tight, encircling garments around the legs, pelvis, or waist.  [employer]  has a wide variety of well priced compression stockings. [place], [place] [id number] [id number] [place] has a good cheap selection, I like the socks, they are not as hard to get on  Walk as much as possible to increase blood flow.  Raise the foot of your bed at night with 2-inch blocks.  SEEK MEDICAL CARE IF:   The skin around your ankle starts to break down.  You have pain, redness, tenderness, or hard swelling developing in your leg over a vein.  You are uncomfortable due to leg pain.  If you ever have shortness of breath with exertion or chest pain go to the ER.

## 2020-03-13 ENCOUNTER — Encounter: Payer: Self-pay | Admitting: Physician Assistant

## 2020-03-13 LAB — COMPLETE METABOLIC PANEL WITH GFR
AG Ratio: 1.3 (calc) (ref 1.0–2.5)
ALT: 15 U/L (ref 6–29)
AST: 18 U/L (ref 10–35)
Albumin: 3.8 g/dL (ref 3.6–5.1)
Alkaline phosphatase (APISO): 124 U/L (ref 37–153)
BUN: 12 mg/dL (ref 7–25)
CO2: 29 mmol/L (ref 20–32)
Calcium: 9.6 mg/dL (ref 8.6–10.4)
Chloride: 95 mmol/L — ABNORMAL LOW (ref 98–110)
Creat: 0.67 mg/dL (ref 0.50–0.99)
GFR, Est African American: 105 mL/min/{1.73_m2} (ref >=60)
GFR, Est Non African American: 90 mL/min/{1.73_m2} (ref >=60)
Globulin: 2.9 g/dL (calc) (ref 1.9–3.7)
Glucose, Bld: 70 mg/dL (ref 65–99)
Potassium: 4.3 mmol/L (ref 3.5–5.3)
Sodium: 131 mmol/L — ABNORMAL LOW (ref 135–146)
Total Bilirubin: 0.4 mg/dL (ref 0.2–1.2)
Total Protein: 6.7 g/dL (ref 6.1–8.1)

## 2020-03-13 LAB — CBC WITH DIFFERENTIAL/PLATELET
Absolute Monocytes: 683 cells/uL (ref 200–950)
Basophils Absolute: 99 cells/uL (ref 0–200)
Basophils Relative: 1 %
Eosinophils Absolute: 446 cells/uL (ref 15–500)
Eosinophils Relative: 4.5 %
HCT: 40.2 % (ref 35.0–45.0)
Hemoglobin: 13.6 g/dL (ref 11.7–15.5)
Lymphs Abs: 3010 cells/uL (ref 850–3900)
MCH: 30.7 pg (ref 27.0–33.0)
MCHC: 33.8 g/dL (ref 32.0–36.0)
MCV: 90.7 fL (ref 80.0–100.0)
MPV: 9.9 fL (ref 7.5–12.5)
Monocytes Relative: 6.9 %
Neutro Abs: 5663 cells/uL (ref 1500–7800)
Neutrophils Relative %: 57.2 %
Platelets: 273 10*3/uL (ref 140–400)
RBC: 4.43 10*6/uL (ref 3.80–5.10)
RDW: 12.2 % (ref 11.0–15.0)
Total Lymphocyte: 30.4 %
WBC: 9.9 10*3/uL (ref 3.8–10.8)

## 2020-03-13 LAB — MAGNESIUM: Magnesium: 1.8 mg/dL (ref 1.5–2.5)

## 2020-03-13 LAB — HEMOGLOBIN A1C
Hgb A1c MFr Bld: 7.5 % of total Hgb — ABNORMAL HIGH (ref <5.7)
Mean Plasma Glucose: 169 (calc)
eAG (mmol/L): 9.3 (calc)

## 2020-03-13 LAB — VITAMIN D 25 HYDROXY (VIT D DEFICIENCY, FRACTURES): Vit D, 25-Hydroxy: 12 ng/mL — ABNORMAL LOW (ref 30–100)

## 2020-03-13 LAB — LIPID PANEL
Cholesterol: 129 mg/dL (ref <200)
HDL: 51 mg/dL (ref >=50)
LDL Cholesterol (Calc): 59 mg/dL (calc)
Non-HDL Cholesterol (Calc): 78 mg/dL (calc) (ref <130)
Total CHOL/HDL Ratio: 2.5 (calc) (ref <5.0)
Triglycerides: 107 mg/dL (ref <150)

## 2020-03-13 LAB — TSH: TSH: 1.17 mIU/L (ref 0.40–4.50)

## 2020-04-03 ENCOUNTER — Other Ambulatory Visit: Payer: Self-pay | Admitting: Adult Health

## 2020-05-08 ENCOUNTER — Other Ambulatory Visit: Payer: Self-pay | Admitting: Internal Medicine

## 2020-05-14 DIAGNOSIS — S72143A Displaced intertrochanteric fracture of unspecified femur, initial encounter for closed fracture: Secondary | ICD-10-CM | POA: Diagnosis not present

## 2020-05-14 DIAGNOSIS — M1612 Unilateral primary osteoarthritis, left hip: Secondary | ICD-10-CM | POA: Diagnosis not present

## 2020-05-14 DIAGNOSIS — M5136 Other intervertebral disc degeneration, lumbar region: Secondary | ICD-10-CM | POA: Diagnosis not present

## 2020-05-14 DIAGNOSIS — S72001A Fracture of unspecified part of neck of right femur, initial encounter for closed fracture: Secondary | ICD-10-CM | POA: Diagnosis not present

## 2020-06-12 ENCOUNTER — Other Ambulatory Visit: Payer: Self-pay | Admitting: Adult Health Nurse Practitioner

## 2020-06-12 ENCOUNTER — Other Ambulatory Visit: Payer: Self-pay | Admitting: Internal Medicine

## 2020-06-12 DIAGNOSIS — I10 Essential (primary) hypertension: Secondary | ICD-10-CM

## 2020-06-19 ENCOUNTER — Encounter: Payer: Self-pay | Admitting: Adult Health

## 2020-06-19 NOTE — Progress Notes (Signed)
MEDICARE ANNUAL WELLNESS VISIT AND 43monthFOLLOW UP  Assessment:   GDaviannawas seen today for medicare wellness.  Diagnoses and all orders for this visit:  Encounter for Medicare annual wellness exam Declines all vaccines, mammogram, colon cancer screening, DEXA follow up despite discussion of risks. She is requesting minimal interventions. She is agreeable for low dose CT scan for lung cancer screening.   Atherosclerosis of abdominal aorta (HCC) Control blood pressure, cholesterol, glucose, increase exercise.   CAD STOP SMOKING Continue medications: Aspirin 828m Lisinoptil 89m59mbisoprolol 89mg20mmdur 60mg38mllows with Cardiology, Dr HardiEllyn Hackas stress test scheduled due to angina Control blood pressure, cholesterol, glucose, increase exercise.   Essential hypertension Monitor blood pressure at home; call if consistently over 130/80 Continue DASH diet.   Reminder to go to the ER if any CP, SOB, nausea, dizziness, severe HA, changes vision/speech, left arm numbness and tingling and jaw pain. 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 Denies any barriers despite questioning. Discussed dietary and exercise modifications. Schedule diabetes eye exam  -     glimepiride (AMARYL) 4 MG tablet; TAKE ONE TABLET BY MOUTH TWICE DAILY BEFORE LUNCH AND SUPPER -     gabapentin (NEURONTIN) 100 MG capsule; Take 1 capsule (100 mg total) by mouth every morning. -     Insulin Glargine (LANTUS SOLOSTAR) 100 UNIT/ML Solostar Pen; Inject 20 Units into the skin at bedtime.  -     Hemoglobin A1c  Liver cirrhosis secondary to NASH (HCC)[facility]. -     COMPLETE METABOLIC PANEL WITH GFR  Chronic obstructive pulmonary disease, unspecified COPD type (HCC) [place] [employer] sx; STOP smoking; monitor   Hyperlipidemia associated with  type 2 diabetes mellitus (HCC) [place] for LDL goal <70 Discussed dietary and exercise modifications. -     Lipid panel  Osteoporosis, unspecified osteoporosis type, unspecified pathological fracture presence Due for DEXA scan -patient declines at this time  Discussed Calcium and Vit D supplementation  Tobacco use disorder Discussed smoking cessation.  Reports she is smoking one pack a day instead of two.  Has been using gum to help.  Reports she has tried chantix, not ready to quit Will continue to assess readiness. -lung cancer screening with low dose CT discussed as recommended by guidelines based on age, number of pack year history.  Discussed risks of screening including but not limited to false positives on xray, further testing or consultation with specialist, and possible false negative CT as well. Understanding expressed and wishes to proceed with CT testing. Order placed.    Chronic hyponatremia Monitor;  -     COMPLETE METABOLIC PANEL WITH GFR  Anxiety Will try low dose lexapro 5 mg daily after discussion with goal to reduce afternoon xanax use Follow up 8-12 weeks or sooner if concern with SE Stress management techniques discussed, increase water, good sleep hygiene discussed, increase exercise, and increase veggies.  -     ALPRAZolam (XANAX) 1 MG tablet; Take 1/2 to one tablet by mouth two or three times a day as needed for anxiety. Limit to five days per week.  Vitamin D deficiency -     VITAMIN D 25 Hydroxy (Vit-D Deficiency, Fractures)  Poor compliance with medication Discussed this at length with patient.    Over 40 minutes of exam, counseling, chart review and  critical decision making was performed Future Appointments  Date Time Provider [place]  07/03/2020 12:30 PM [facility] N[facility]  08/20/2020 11:20 AM Leonie Man, MD [facility]  03/13/2021  3:00 PM Garnet Sierras, NP [facility] None  06/25/2021  3:00 PM Liane Comber,  NP [facility] None     Plan:   During the course of the visit the patient was educated and counseled about appropriate screening and preventive services including:    Pneumococcal vaccine   Prevnar 13  Influenza vaccine  Td vaccine  Screening electrocardiogram  Bone densitometry screening  Colorectal cancer screening  Diabetes screening  Glaucoma screening  Nutrition counseling   Advanced directives: requested   Subjective:  Sara Lamb is a 69 y.o. MWF female who presents for Medicare Annual Wellness Visit and 3 month follow up.  She has HTN, HLD, DMII with AA, NASH, Osteoporosis, Tobacco use, Vit D Defciency, COPD, chronic hyponatremia, anxiety and poor medication compliance.  She is very noncompliant with test, vaccines, and has an aversion to medication with " reactions" to the majority of medications.   She is s/p fall in Jan 2021 and fracture to her hip, continues to use walker, 1 step at home, can manage with handrail. Has handicap shower installed with rail. She is on gabapentin 132m that is helping. Denies falls since Jan. Completed PT. Follows with ROval Linseyortho, taking tylenol, recently added flexeril and voltaren gel with perceived benefit.   She is on xanax 1 pill every night due to sleep and she will take another half due to stress and shaking. Also taking 1/2 tab in the afternoon due to shakes and anxiety. Very stressed due to husband's health. She has not tried any daily agent. She reports trazodone made her more irritated/kept her awake. Discussed low dose SSRI-   She has cirrhosis due to NASH, has RUQ pain, following with GI Dr. AHavery Moros EGD 01/2017, no varices.   She has COPD by imaging, denies dyspnea or secretions, continues to smoke, reports has reduced from 2-2.5 to 1.5 packs recently. Last CT chest 10/2017  BMI is Body mass index is 28.22 kg/m., she has not been working on diet and exercise. Wt Readings from Last 3 Encounters:  06/24/20  135 lb (61.2 kg)  06/24/20 135 lb 3.2 oz (61.3 kg)  03/12/20 133 lb 3.2 oz (60.4 kg)   Had cath 2019 showed Prox RCA to Mid RCA lesion is 60% stenosed. FFR negative- following with Dr. HEllyn Hack just saw today due to angina episodes, increased imdur from 30 mg to 60 mg today, planning stess test next week.  .Marland KitchenHer blood pressure has been controlled at home, today their BP is BP: 132/66 She does not workout. She denies shortness of breath, dizziness.   She has aortic atherosclerosis per CT 12/2016 She is on cholesterol medication (rosuvastatin 20 mg daily) and denies myalgias. Her cholesterol is at goal. The cholesterol last visit was:   Lab Results  Component Value Date   CHOL 129 03/12/2020   HDL 51 03/12/2020   LDLCALC 59 03/12/2020   TRIG 107 03/12/2020   CHOLHDL 2.5 03/12/2020   She has had diabetes for 20 years. She has not been working on diet and exercise for diabetes, and denies hyperglycemia, hypoglycemia , nausea, paresthesia of the feet, polydipsia, polyuria, visual disturbances, vomiting and weight loss. She is prescribed glimepiride 4 mg BID, lantus 20 units PM. Fasting range 82-142, typically 120s.  Lab Results  Component Value Date  HGBA1C 7.5 (H) 03/12/2020   Last GFR: Lab Results  Component Value Date   GFRNONAA 90 03/12/2020   Patient is on Vitamin D supplement, taking 1000 units three days a week, GI upset with higher doses.   Lab Results  Component Value Date   VD25OH 12 (L) 03/12/2020      Medication Review: Current Outpatient Medications on File Prior to Visit  Medication Sig Dispense Refill   acetaminophen (TYLENOL) 500 MG tablet Take 500 mg by mouth as directed. Take 2 tablets three times ad ay as needed     albuterol (PROVENTIL) (2.5 MG/3ML) 0.083% nebulizer solution Take 3 mLs (2.5 mg total) by nebulization every 6 (six) hours as needed for wheezing or shortness of breath. 75 mL 12   ALPRAZolam (XANAX) 1 MG tablet Take 1/2 - 1 tablet two to three  times daily ONLY if needed for Anxiety Attack & limit to 5 days /week to avoid Addiction & Dementia 60 tablet 0   aspirin EC 81 MG tablet Take 81 mg by mouth daily.     Cholecalciferol (VITAMIN D3) 1000 units CAPS Take 1,000 Units by mouth every Monday, Wednesday, and Friday.      cyclobenzaprine (FLEXERIL) 5 MG tablet Take 5 mg by mouth 3 (three) times daily as needed.     furosemide (LASIX) 40 MG tablet Take 1/2 to 1 tablet Daily if needed ofr Fluid Retention / leg/ankle swelling (Patient taking differently: Takes one tablet every other day) 90 tablet 1   gabapentin (NEURONTIN) 100 MG capsule Take 1 capsule (100 mg total) by mouth 3 (three) times daily. 270 capsule 1   glimepiride (AMARYL) 4 MG tablet TAKE ONE TABLET TWICE DAILY BEFORE LUNCH AND SUPPER 180 tablet 1   insulin glargine (LANTUS) 100 UNIT/ML Solostar Pen Inject 20 Units into the skin daily.     Insulin Pen Needle (CAREFINE PEN NEEDLES) 32G X 4 MM MISC Use 1x a day 100 each 3   lisinopril (ZESTRIL) 5 MG tablet Take      1/2 to 1 tablet       at Night      for BP 90 tablet 0   nitroGLYCERIN (NITROSTAT) 0.4 MG SL tablet Place 1 tablet (0.4 mg total) under the tongue every 5 (five) minutes as needed for chest pain. 25 tablet 5   ondansetron (ZOFRAN) 4 MG tablet Take 1 tablet (4 mg total) by mouth every 4 (four) hours as needed for nausea or vomiting. 20 tablet 0   propranolol ER (INDERAL LA) 60 MG 24 hr capsule Take 60 mg by mouth as needed.     rosuvastatin (CRESTOR) 20 MG tablet Take 1 tablet 3 x /week on Mon Wed & Fri 39 tablet 0   No current facility-administered medications on file prior to visit.    Allergies  Allergen Reactions   Actos [Pioglitazone] Other (See Comments)    Patient stated that it elevated her BGL   Glyburide Other (See Comments)    Patient stated that it elevated her BGL   Metformin And Related     "metformin caused mini-strokes" and ELEVATED BS and also GLYBURIDE, ONGLYZA, ACTOS, INVOKANA  caused elevated BS   Onglyza [Saxagliptin] Other (See Comments)    Patient stated that it elevated her BGL   Insulins Rash   Janumet [Sitagliptin-Metformin Hcl] Rash and Other (See Comments)    Shakes/ rash   Penicillins Rash and Other (See Comments)    Current Problems (verified) Patient Active Problem List  Diagnosis Date Noted   Chest pain with moderate risk for cardiac etiology 06/24/2020   Type 2 diabetes mellitus ([place]) 11/24/2018   Chronic hyponatremia 03/18/2018   Coronary artery disease, non-occlusive: RCA 60%, FFR Negative. 02/16/2018   Cognitive dysfunction 03/27/2017   Liver cirrhosis secondary to NASH ([place]) 11/23/2016   Osteoporosis 05/27/2016   Atherosclerosis of abdominal aorta ([place]) 04/30/2016   COPD (chronic obstructive pulmonary disease) ([place]) 04/30/2015   Tobacco use disorder 04/08/2015   Poor compliance with medication 11/05/2014   Vitamin D deficiency    Anxiety    Essential hypertension    Hyperlipidemia associated with type 2 diabetes mellitus ([place])     Screening Tests Immunization History  Administered Date(s) Administered   Hep A / Hep B 01/07/2017, 02/08/2017, 07/09/2017   Td 06/26/2001   Tdap 04/30/2015   Preventative care: Last colonoscopy: 12/2016, polyps, Dr. Havery Moros, 3 year follow up due , declines referral, phone number given to call  EGD: 01/2017 gastritis, no varices  Last mammogram: 05/25/2016 OVERDUE - declines, would not pursue treatment if positive  Last pap smear/pelvic exam: remote, declines  DEXA: 04/2016 - R fem T -3.2, declines follow up at this time   MRI brain 2016 CT chest 10/2017 IMPRESSION: 1. Multiple scattered millimetric peripheral pulmonary nodules are unchanged and considered benign. 2. Aortic atherosclerosis (ICD10-170.0). Three-vessel coronary artery calcification. 3. Cirrhosis.  Prior vaccinations: DECLINES ALL VACCINES TD or Tdap: 2016   Names of Other Physician/Practitioners you  currently use: 1. [facility] here for primary care 2. none, eye doctor, encouraged to see eye doctor - she states will call Dr. Gilford Rile in [place] for diabetes eye exam  3. Dentures dentist  Patient Care Team: Unk Pinto, MD as PCP - General (Internal Medicine) Leonie Man, MD as PCP - Cardiology (Cardiology) Kinsinger, Arta Bruce, MD as Consulting Physician (General Surgery) Philemon Kingdom, MD as Consulting Physician (Internal Medicine) Armbruster, Carlota Raspberry, MD as Consulting Physician (Gastroenterology)  SURGICAL HISTORY She  has a past surgical history that includes Vaginal hysterectomy (age 32); Bilateral salpingoophorectomy (1997); Knee arthroscopy (Right, 11/2014); Cholecystectomy (N/A, 09/30/2016); LEFT HEART CATH AND CORONARY ANGIOGRAPHY (N/A, 04/12/2018); INTRAVASCULAR PRESSURE WIRE/FFR STUDY (N/A, 04/12/2018); and transthoracic echocardiogram (02/2018). FAMILY HISTORY Her family history includes CAD in her maternal aunt, maternal uncle, and sister; CAD (age of onset: 30) in her mother; Cancer in her father; Coronary artery disease (age of onset: 4) in her brother; Diabetes in her brother, father, and mother; Heart attack in her maternal grandmother; Heart attack (age of onset: 29) in her sister; Hyperlipidemia in her brother, sister, and sister; Hypertension in her brother, father, and sister; Stomach cancer in her mother; Stroke in her mother.  SOCIAL HISTORY She  reports that she has been smoking cigarettes. She started smoking about 52 years ago. She has a 58.50 pack-year smoking history. She has never used smokeless tobacco. She reports that she does not drink alcohol and does not use drugs.   MEDICARE WELLNESS OBJECTIVES: Physical activity: Current Exercise Habits: The patient does not participate in regular exercise at present, Exercise limited by: orthopedic condition(s) Cardiac risk factors: Cardiac Risk Factors include:  advanced age (>50mn, >>10women);dyslipidemia;hypertension;sedentary lifestyle;smoking/ tobacco exposure;diabetes mellitus Depression/mood screen:   [facility] 11/01/2019  Decreased Interest 0  Down, Depressed, Hopeless 0  PHQ - 2 Score 0    ADLs:  In your present state of health, do you have any difficulty performing the following activities: 06/24/2020 11/01/2019  Hearing?  N N  Vision? N N  Difficulty concentrating or making decisions? N N  Walking or climbing stairs? Y N  Comment using walker, 1 step at home, can manage with handrail -  Dressing or bathing? N N  Doing errands, shopping? Y N  Some recent data might be hidden     Cognitive Testing  Alert? Yes  Normal Appearance?Yes  Oriented to person? Yes  Place? Yes   Time? Yes  Recall of three objects?  Yes  Can perform simple calculations? Yes  Displays appropriate judgment?Yes  Can read the correct time from a watch face?Yes  EOL planning: Does Patient Have a Medical Advance Directive?: Yes Type of Advance Directive: Healthcare Power of Attorney, Living will Does patient want to make changes to medical advance directive?: No - Patient declined Copy of [place] in Chart?: No - copy requested  Review of Systems  Constitutional: Negative for malaise/fatigue and weight loss.  HENT: Negative for hearing loss and tinnitus.   Eyes: Negative for blurred vision and double vision.  Respiratory: Negative for cough, sputum production, shortness of breath and wheezing.   Cardiovascular: Negative for chest pain, palpitations, orthopnea, claudication, leg swelling and PND.  Gastrointestinal: Negative for abdominal pain, blood in stool, constipation, diarrhea, heartburn, melena, nausea and vomiting.  Genitourinary: Negative.   Musculoskeletal: Negative for falls, joint pain and myalgias.  Skin: Negative for rash.  Neurological: Negative for dizziness, tingling, sensory change, weakness and headaches.   Endo/Heme/Allergies: Negative for polydipsia.  Psychiatric/Behavioral: Negative.  Negative for depression, memory loss, substance abuse and suicidal ideas. The patient is not nervous/anxious and does not have insomnia.   All other systems reviewed and are negative.    Objective:     Today's Vitals   06/24/20 1516  BP: 132/66  Pulse: 88  Temp: (!) 96.8 F (36 C)  SpO2: 97%  Weight: 135 lb (61.2 kg)   Body mass index is 28.22 kg/m.  General appearance: alert, no distress, WD/WN, female HEENT: normocephalic, sclerae anicteric, TMs pearly, nares patent, no discharge or erythema, pharynx normal Oral cavity: MMM, no lesions Neck: supple, no lymphadenopathy, no thyromegaly, no masses Heart: RRR, normal S1, S2, murmurs Lungs: CTA bilaterally, no wheezes, rhonchi, or rales Abdomen: +bs, soft, non tender, non distended, no masses, no hepatomegaly, no splenomegaly Musculoskeletal: no swelling, no obvious deformity, tender to light palpation to left lumbar. Extremities: no edema, no cyanosis Pulses: 2+ symmetric, upper and lower extremities, normal cap refill Neurological: alert, oriented x 3, CN2-12 intact, strength normal upper extremities and lower extremities, sensation normal throughout, DTRs 2+ throughout, 3+ patellar reflex, no cerebellar signs, gait normal Psychiatric: normal affect, behavior normal, pleasant   Medicare Attestation I have personally reviewed: The patient's medical and social history Their use of alcohol, tobacco or illicit drugs Their current medications and supplements The patient's functional ability including ADLs,fall risks, home safety risks, cognitive, and hearing and visual impairment Diet and physical activities Evidence for depression or mood disorders  The patient's weight, height, BMI, and visual acuity have been recorded in the chart.  I have made referrals, counseling, and provided education to the patient based on review of the above and I have  provided the patient with a written personalized care plan for preventive services.     Izora Ribas, NP   06/24/2020

## 2020-06-24 ENCOUNTER — Encounter: Payer: Self-pay | Admitting: Cardiology

## 2020-06-24 ENCOUNTER — Telehealth: Payer: Self-pay | Admitting: Cardiology

## 2020-06-24 ENCOUNTER — Ambulatory Visit (INDEPENDENT_AMBULATORY_CARE_PROVIDER_SITE_OTHER): Payer: PPO | Admitting: Adult Health

## 2020-06-24 ENCOUNTER — Other Ambulatory Visit: Payer: Self-pay

## 2020-06-24 ENCOUNTER — Ambulatory Visit: Payer: PPO | Admitting: Cardiology

## 2020-06-24 ENCOUNTER — Encounter: Payer: Self-pay | Admitting: Adult Health

## 2020-06-24 VITALS — BP 132/66 | HR 88 | Temp 96.8°F | Wt 135.0 lb

## 2020-06-24 VITALS — BP 140/61 | HR 76 | Temp 97.3°F | Ht <= 58 in | Wt 135.2 lb

## 2020-06-24 DIAGNOSIS — I2510 Atherosclerotic heart disease of native coronary artery without angina pectoris: Secondary | ICD-10-CM

## 2020-06-24 DIAGNOSIS — E1159 Type 2 diabetes mellitus with other circulatory complications: Secondary | ICD-10-CM | POA: Diagnosis not present

## 2020-06-24 DIAGNOSIS — Z0001 Encounter for general adult medical examination with abnormal findings: Secondary | ICD-10-CM | POA: Diagnosis not present

## 2020-06-24 DIAGNOSIS — R079 Chest pain, unspecified: Secondary | ICD-10-CM | POA: Diagnosis not present

## 2020-06-24 DIAGNOSIS — I700 Atherosclerosis of aorta: Secondary | ICD-10-CM

## 2020-06-24 DIAGNOSIS — Z122 Encounter for screening for malignant neoplasm of respiratory organs: Secondary | ICD-10-CM

## 2020-06-24 DIAGNOSIS — F17200 Nicotine dependence, unspecified, uncomplicated: Secondary | ICD-10-CM | POA: Diagnosis not present

## 2020-06-24 DIAGNOSIS — I10 Essential (primary) hypertension: Secondary | ICD-10-CM

## 2020-06-24 DIAGNOSIS — F17210 Nicotine dependence, cigarettes, uncomplicated: Secondary | ICD-10-CM

## 2020-06-24 DIAGNOSIS — R6889 Other general symptoms and signs: Secondary | ICD-10-CM | POA: Diagnosis not present

## 2020-06-24 DIAGNOSIS — Z0000 Encounter for general adult medical examination without abnormal findings: Secondary | ICD-10-CM

## 2020-06-24 DIAGNOSIS — E785 Hyperlipidemia, unspecified: Secondary | ICD-10-CM | POA: Diagnosis not present

## 2020-06-24 DIAGNOSIS — R0789 Other chest pain: Secondary | ICD-10-CM | POA: Diagnosis not present

## 2020-06-24 DIAGNOSIS — E1169 Type 2 diabetes mellitus with other specified complication: Secondary | ICD-10-CM

## 2020-06-24 DIAGNOSIS — K7581 Nonalcoholic steatohepatitis (NASH): Secondary | ICD-10-CM

## 2020-06-24 DIAGNOSIS — E559 Vitamin D deficiency, unspecified: Secondary | ICD-10-CM

## 2020-06-24 DIAGNOSIS — Z9114 Patient's other noncompliance with medication regimen: Secondary | ICD-10-CM

## 2020-06-24 DIAGNOSIS — F09 Unspecified mental disorder due to known physiological condition: Secondary | ICD-10-CM

## 2020-06-24 DIAGNOSIS — F419 Anxiety disorder, unspecified: Secondary | ICD-10-CM

## 2020-06-24 DIAGNOSIS — E871 Hypo-osmolality and hyponatremia: Secondary | ICD-10-CM

## 2020-06-24 DIAGNOSIS — K7460 Unspecified cirrhosis of liver: Secondary | ICD-10-CM

## 2020-06-24 DIAGNOSIS — Z794 Long term (current) use of insulin: Secondary | ICD-10-CM | POA: Diagnosis not present

## 2020-06-24 DIAGNOSIS — J449 Chronic obstructive pulmonary disease, unspecified: Secondary | ICD-10-CM

## 2020-06-24 DIAGNOSIS — Z91148 Patient's other noncompliance with medication regimen for other reason: Secondary | ICD-10-CM

## 2020-06-24 DIAGNOSIS — M810 Age-related osteoporosis without current pathological fracture: Secondary | ICD-10-CM

## 2020-06-24 MED ORDER — ISOSORBIDE MONONITRATE ER 30 MG PO TB24: 60.0000 mg | ORAL_TABLET | Freq: Every day | ORAL | 3 refills | Status: DC

## 2020-06-24 MED ORDER — ESCITALOPRAM OXALATE 5 MG PO TABS: 5.0000 mg | ORAL_TABLET | Freq: Every day | ORAL | 0 refills | Status: DC

## 2020-06-24 NOTE — Patient Instructions (Addendum)
  Sara Lamb , Thank you for taking time to come for your Medicare Wellness Visit. I appreciate your ongoing commitment to your health goals. Please review the following plan we discussed and let me know if I can assist you in the future.   These are the goals we discussed: Goals    . Quit Smoking       This is a list of the screening recommended for you and due dates:  Health Maintenance  Topic Date Due  . Eye exam for diabetics  Never done  . Colon Cancer Screening  01/12/2020  . COVID-19 Vaccine (1) 07/10/2020*  . Flu Shot  10/24/2020*  . Mammogram  06/24/2021*  . Pneumonia vaccines (1 of 2 - PCV13) 06/24/2021*  . Hemoglobin A1C  09/12/2020  . Complete foot exam   06/24/2021  . Tetanus Vaccine  04/29/2025  . DEXA scan (bone density measurement)  Completed  .  Hepatitis C: One time screening is recommended by Center for Disease Control  (CDC) for  adults born from [birth year] through 1965.   Completed  *Topic was postponed. The date shown is not the original due date.      Please call Dr. Havery Moros  - ? Wanted to follow up in 3 years - [phone]  Please schedule diabetes eye exam with Dr. Gilford Rile or other - send report before the end of the year       [place]  [phone] for more information or for a free program for smoking cessation help.   You can call QUIT SMART 1-800-QUIT-NOW for free nicotine patches or replacement therapy- if they are out- keep calling  [facility] Can call for smoking cessation classes, [phone]  If you have a smart phone, please look up Smoke Free app, this will help you stay on track and give you information about money you have saved, life that you have gained back and a ton of more information.     ADVANTAGES OF QUITTING SMOKING  Within 20 minutes, blood pressure decreases. Your pulse is at normal level.  After 8 hours, carbon monoxide levels in the blood return to normal. Your  oxygen level increases.  After 24 hours, the chance of having a heart attack starts to decrease. Your breath, hair, and body stop smelling like smoke.  After 48 hours, damaged nerve endings begin to recover. Your sense of taste and smell improve.  After 72 hours, the body is virtually free of nicotine. Your bronchial tubes relax and breathing becomes easier.  After 2 to 12 weeks, lungs can hold more air. Exercise becomes easier and circulation improves.  After 1 year, the risk of coronary heart disease is cut in half.  After 5 years, the risk of stroke falls to the same as a nonsmoker.  After 10 years, the risk of lung cancer is cut in half and the risk of other cancers decreases significantly.  After 15 years, the risk of coronary heart disease drops, usually to the level of a nonsmoker.  You will have extra money to spend on things other than cigarettes.

## 2020-06-24 NOTE — Progress Notes (Signed)
Primary Care Provider: Unk Pinto, MD Cardiologist: Glenetta Hew, MD Electrophysiologist: None  Clinic Note: Chief Complaint  Patient presents with  . Chest Pain    Work in visit to assess chest pain or shortness of breath    HPI:    Sara Lamb is a 69 y.o. female with a PMH borderline single-vessel disease (proximal RCA treated medically), DM-2, HTN and HLD along with prior tobacco abuse who presents today for work in visit to evaluate for chest pain.  She had a fall in January 2021 where she had ORIF of the right hip with intertrochanteric nail placement.  This was related to loss of balance related to UTI he told me that she got tangled up in stuff there was scattered on the floor).  Exacerbated by poorly controlled diabetes and A1c of 12.9%.  Following her surgery she spent 2 weeks at [facility] for rehab. -->  2 weeks after the initial injury, but it does occur not she actually also injured her knee.  Problem List Items Addressed This Visit    Essential hypertension (Chronic)   Hyperlipidemia associated with type 2 diabetes mellitus (HCC) (Chronic)   Coronary artery disease, non-occlusive: RCA 60%, FFR Negative. - Primary (Chronic)   Chest pain with moderate risk for cardiac etiology     Sara Lamb was last seen on 02/05/2020 via TeleHealth Phone Visit.  She is doing relatively well.  Using her walker pre much continuously.  Her knee was definitely bothering her a lot but she was up in attempt avoid surgery.  There was concern that she may potentially require hip and knee replacement surgery due to her prior ORIF surgery.  Was still having intermittent episodes of chest discomfort, and she was taking bisoprolol.  However the bisoprolol was actually discontinued and she was placed on as needed propranolol for tremors.  Her Imdur dose has been reduced down to 30 mg as well, and the ACE inhibitor also was reduced..  Recent Hospitalizations: None  Reviewed   CV studies:    The following studies were reviewed today: (if available, images/films reviewed: From Epic Chart or Care Everywhere) . None:   Interval History:   Sara Lamb presents here today stating that she has had intermittent significant sharp central/left-sided chest pain over the last 2 weeks.  Is also associated with shortness of breath.  Usually goes away with rest.  Most notable episode occurred over the weekend where she had some significant heart racing and palpitations.  Her blood pressure was in the 90s with a heart rate in 100s.  Was scared to take nitroglycerin.  She says that she has been trying to do more exercise and walking.  But now she has been walking with a walker and says it by time she gets into the store (she will get chest discomfort.  Walking into the clinic from the parking lot she had chest discomfort.  She has been under a lot of stress but is not sure what to make of the current symptoms.  At the grocery store she will get the grocery cart and had to hold on.  She is able to go around but will have to stop several times before getting groceries completed.  This is relatively unusual for her.  Chest discomfort is usually associated with dyspnea.  Also sometimes with some irregular heartbeats. Whereas she used to be able to do her housecleaning chores without any issues, now she can only do 5 to 10  minutes without having to stop and rest.  Usually the symptoms abate with rest.  She is not had any resting dyspnea, PND orthopnea, but she has had swelling which is definitely worse on the right side (postop side, usually starting at the knee and going down to the ankle.).  She says that she takes her Lasix about 2 to 3 days a week.  She also says that she is trying not to take the propranolol.  CV Review of Symptoms (Summary): positive for - chest pain, dyspnea on exertion, edema, palpitations and rapid heart rate negative for - irregular heartbeat, orthopnea,  paroxysmal nocturnal dyspnea, shortness of breath or Syncope/near syncope, TIA, versus fugax, claudication  The patient does not have symptoms concerning for COVID-19 infection (fever, chills, cough, or new shortness of breath).   REVIEWED OF SYSTEMS   Review of Systems  Constitutional: Positive for malaise/fatigue. Negative for weight loss (Has actually gained weight since she is reinjured her knee.).  HENT: Negative for congestion and nosebleeds.   Respiratory: Negative for cough and shortness of breath (Per HPI).   Cardiovascular: Positive for leg swelling.  Gastrointestinal: Negative for abdominal pain, blood in stool and melena.  Genitourinary: Negative for hematuria.  Musculoskeletal: Positive for joint pain (Right knee and hip pain). Negative for falls (She has had couple close calls but no true falls).  Neurological: Positive for dizziness and focal weakness (Right leg). Negative for headaches.  Psychiatric/Behavioral: Negative for depression (Has some features, more dysthymic) and memory loss. The patient is nervous/anxious and has insomnia.        Lots of social stress   I have reviewed and (if needed) personally updated the patient's problem list, medications, allergies, past medical and surgical history, social and family history.   PAST MEDICAL HISTORY   Past Medical History:  Diagnosis Date  . Anxiety   . Anxiety disorder   . Cirrhosis ([place])   . COPD (chronic obstructive pulmonary disease) ([place])    followed by pcp--  last exacerbation 11/ 2017  . Coronary artery disease, non-occlusive 03/2018   Cardiac Cath: Moderate (60%) proximal-mid RCA (FFR 0.89 => NEGATIVE / not physiologically significant).  Normal EF and EDP.  . Depression    Patient denies  . Gastritis   . History of rib fracture    2004  . History of TIAs    x5-- last one 2017--  "gets real sharp stabbing pain over eye and speech screws up"  pt states takes excederin migraine and lies down (pt stated was  told if she "if come to ER again they would send her to psych ward")  . Hyperlipidemia   . Hypertension   . OA (osteoarthritis)    back, hips, knees, feet  . Osteoporosis   . Polyp of colon, hyperplastic   . Pulmonary nodules    per CT chest 08-28-2016  -- bilateral upper lobe nodules  . S/P right hip fracture 09/06/2019  . Stroke ([place])   . Tubular adenoma of colon   . Type 2 diabetes mellitus ([place])    followed by pcp--  last A1c 9.1 on 08-01-2016  . UTI (urinary tract infection)   . Wears dentures     PAST SURGICAL HISTORY   Past Surgical History:  Procedure Laterality Date  . BILATERAL SALPINGOOPHORECTOMY  1997   via Laparoscopy  . CHOLECYSTECTOMY N/A 09/30/2016   Procedure: LAPAROSCOPIC CHOLECYSTECTOMY;  Surgeon: Mickeal Skinner, MD;  [facility];  Service: General;  Laterality: N/A;  .  INTRAVASCULAR PRESSURE WIRE/FFR STUDY N/A 04/12/2018   Procedure: INTRAVASCULAR PRESSURE WIRE/FFR STUDY;  Surgeon: Leonie Man, MD;  Location: [facility];  Service: Cardiovascular;  Laterality: N/A;  . KNEE ARTHROSCOPY Right 11/2014  . LEFT HEART CATH AND CORONARY ANGIOGRAPHY N/A 04/12/2018   Procedure: LEFT HEART CATH AND CORONARY ANGIOGRAPHY;  Surgeon: Leonie Man, MD;  Location: [facility];  Service: Cardiovascular;  Laterality: N/A;  . TRANSTHORACIC ECHOCARDIOGRAM  02/2018    Normal LV function.  EF 60 to 65%.  No RWMMA.  GRII DD.  Moderate LA dilation.  Marland Kitchen VAGINAL HYSTERECTOMY  age 75    Immunization History  Administered Date(s) Administered  . Hep A / Hep B 01/07/2017, 02/08/2017, 07/09/2017  . Td 06/26/2001  . Tdap 04/30/2015    MEDICATIONS/ALLERGIES   Current Meds  Medication Sig  . acetaminophen (TYLENOL) 500 MG tablet Take 500 mg by mouth as directed. Take 2 tablets three times ad ay as needed  . albuterol (PROVENTIL) (2.5 MG/3ML) 0.083% nebulizer solution Take 3 mLs (2.5 mg total) by nebulization every 6 (six) hours as  needed for wheezing or shortness of breath.  . ALPRAZolam (XANAX) 1 MG tablet Take 1/2 - 1 tablet two to three times daily ONLY if needed for Anxiety Attack & limit to 5 days /week to avoid Addiction & Dementia  . aspirin EC 81 MG tablet Take 81 mg by mouth daily.  . Cholecalciferol (VITAMIN D3) 1000 units CAPS Take 1,000 Units by mouth every Monday, Wednesday, and Friday.   . furosemide (LASIX) 40 MG tablet Take 1/2 to 1 tablet Daily if needed ofr Fluid Retention / leg/ankle swelling (Patient taking differently: Takes one tablet every other day)  . gabapentin (NEURONTIN) 100 MG capsule Take 1 capsule (100 mg total) by mouth 3 (three) times daily.  . insulin glargine (LANTUS) 100 UNIT/ML Solostar Pen Inject 20 Units into the skin daily.  . Insulin Pen Needle (CAREFINE PEN NEEDLES) 32G X 4 MM MISC Use 1x a day  . lisinopril (ZESTRIL) 5 MG tablet Take      1/2 to 1 tablet       at Night      for BP  . nitroGLYCERIN (NITROSTAT) 0.4 MG SL tablet Place 1 tablet (0.4 mg total) under the tongue every 5 (five) minutes as needed for chest pain.  Marland Kitchen ondansetron (ZOFRAN) 4 MG tablet Take 1 tablet (4 mg total) by mouth every 4 (four) hours as needed for nausea or vomiting.  . propranolol ER (INDERAL LA) 60 MG 24 hr capsule Take 60 mg by mouth as needed.  . rosuvastatin (CRESTOR) 20 MG tablet Take 1 tablet 3 x /week on Mon Wed & Fri  . [DISCONTINUED] glimepiride (AMARYL) 4 MG tablet TAKE ONE TABLET TWICE DAILY BEFORE LUNCH AND SUPPER  . [DISCONTINUED] traMADol (ULTRAM) 50 MG tablet Take 50 mg by mouth every 6 (six) hours as needed. Take 1/4 to 1 tablet as needed for pain  . [DISCONTINUED] traZODone (DESYREL) 50 MG tablet Take 1/2 to 1 tablet at Bedtime if needed for Sleep    Allergies  Allergen Reactions  . Actos [Pioglitazone] Other (See Comments)    Patient stated that it elevated her BGL  . Glyburide Other (See Comments)    Patient stated that it elevated her BGL  . Metformin And Related     "metformin  caused mini-strokes" and ELEVATED BS and also GLYBURIDE, ONGLYZA, ACTOS, INVOKANA caused elevated BS  . Onglyza [Saxagliptin] Other (  See Comments)    Patient stated that it elevated her BGL  . Insulins Rash  . Janumet [Sitagliptin-Metformin Hcl] Rash and Other (See Comments)    Shakes/ rash  . Penicillins Rash and Other (See Comments)    SOCIAL HISTORY/FAMILY HISTORY   Reviewed in Epic:  Pertinent findings: She notes having lots of increased social stress.  Seems very anxious today.  OBJCTIVE -PE, EKG, labs   Wt Readings from Last 3 Encounters:  06/24/20 135 lb (61.2 kg)  06/24/20 135 lb 3.2 oz (61.3 kg)  03/12/20 133 lb 3.2 oz (60.4 kg)    Physical Exam: BP 140/61   Pulse 76   Temp (!) 97.3 F (36.3 C)   Ht 4' 10"  (1.473 m)   Wt 135 lb 3.2 oz (61.3 kg)   SpO2 98%   BMI 28.26 kg/m  Physical Exam Vitals reviewed.  Constitutional:      General: She is not in acute distress.    Appearance: Normal appearance. She is normal weight. She is not ill-appearing or toxic-appearing.  Neck:     Vascular: No carotid bruit, hepatojugular reflux or JVD.  Cardiovascular:     Rate and Rhythm: Normal rate and regular rhythm.  No extrasystoles are present.    Chest Wall: PMI is not displaced.     Pulses: Normal pulses.     Heart sounds: Normal heart sounds. No murmur heard.  No friction rub. No gallop.   Pulmonary:     Effort: Pulmonary effort is normal. No respiratory distress.     Breath sounds: Normal breath sounds.  Chest:     Chest wall: No tenderness (Some point tenderness, but not reproducing her pain).  Abdominal:     General: Abdomen is flat. Bowel sounds are normal. There is no distension.     Palpations: Abdomen is soft. There is no mass.  Musculoskeletal:        General: Swelling (Right knee and right ankle 2+, minimal left sided swelling) and signs of injury (Right knee is swollen and tender) present.     Cervical back: Normal range of motion and neck supple.      Comments: Reduced range of motion of right leg  Neurological:     General: No focal deficit present.     Mental Status: She is alert and oriented to person, place, and time. Mental status is at baseline.  Psychiatric:        Behavior: Behavior normal.        Thought Content: Thought content normal.        Judgment: Judgment normal.     Comments: Somewhat anxious mood and affect.     Adult ECG Report  Rate: 76 ;  Rhythm: normal sinus rhythm and Normal axis, intervals durations.;   Narrative Interpretation: Normal EKG  Recent Labs: Reviewed Lab Results  Component Value Date   CHOL 164 06/24/2020   HDL 45 (L) 06/24/2020   LDLCALC 81 06/24/2020   TRIG 317 (H) 06/24/2020   CHOLHDL 3.6 06/24/2020   Lab Results  Component Value Date   CREATININE 0.81 06/24/2020   BUN 18 06/24/2020   NA 132 (L) 06/24/2020   K 4.9 06/24/2020   CL 93 (L) 06/24/2020   CO2 29 06/24/2020   Lab Results  Component Value Date   TSH 1.24 06/24/2020    ASSESSMENT/PLAN    Problem List Items Addressed This Visit    Essential hypertension (Chronic)    Interestingly she has had a couple of lows  of blood pressure.  Currently 140/60, but at low pressures yesterday.  I am leery of adjusting medications too much at this point.  She is on lisinopril 2.5 mg and on as needed propranolol.  This is in addition to Imdur 30 mg.  Plan: Increase Imdur to 60 mg.  Recommend that she actually does take the Inderal (propranolol) on a more regular basis.      Relevant Medications   isosorbide mononitrate (IMDUR) 30 MG 24 hr tablet   Hyperlipidemia associated with type 2 diabetes mellitus (HCC) (Chronic)    She is taking Crestor 3 days a week.  Last labs showed LDL 59 however rechecked by her PCP the day of this visit.  I have reviewed labs above, LDL is up now into the 80s. We will await results of Myoview, if there is significant disease change, would potentially add Zetia to get LDL back down to below 70.       Coronary artery disease, non-occlusive: RCA 60%, FFR Negative. (Chronic)    She had moderate FFR negative disease of the RCA with recent symptoms of post exertional chest discomfort and dyspnea.  Difficult to tell, because her history giving is relatively poor.  She is currently only on Imdur and lisinopril.  Depending on what her stress test looks like, would probably have to take Inderal/propranolol on a more daily basis.  Plan: Increase Imdur to 60 mg,   Lexiscan Myoview (she clearly cannot walk treadmill with her knees and hip pain.  Pending results of stress test, tentatively have her start back on Inderal as a regular medication.      Relevant Medications   isosorbide mononitrate (IMDUR) 30 MG 24 hr tablet   Other Relevant Orders   EKG 12-Lead (Completed)   Cardiac Stress Test: Informed Consent Details: Physician/Practitioner Attestation; Transcribe to consent form and obtain patient signature   MYOCARDIAL PERFUSION IMAGING   Tobacco use disorder (Chronic)    We talked about cessation briefly.  She knows she needs to stop, but at this point is more active in discussing her chest pain.      Chest pain with moderate risk for cardiac etiology - Primary    Relatively concerning chest pain for possible anginal equivalent.  I think is not reasonable to evaluate for ischemia since she does have known CAD.  Although it was nonobstructive, it was least moderate to severe.  Plan: Check Myoview.  If nonischemic, would consider the possibility of microvascular disease.  In that case may want to consider calcium channel blocker such as amlodipine in place of lisinopril.      Relevant Orders   EKG 12-Lead (Completed)   Cardiac Stress Test: Informed Consent Details: Physician/Practitioner Attestation; Transcribe to consent form and obtain patient signature   MYOCARDIAL PERFUSION IMAGING    Other Visit Diagnoses    Other chest pain       Relevant Orders   EKG 12-Lead (Completed)   Cardiac  Stress Test: Informed Consent Details: Physician/Practitioner Attestation; Transcribe to consent form and obtain patient signature   MYOCARDIAL PERFUSION IMAGING       COVID-19 Education: The signs and symptoms of COVID-19 were discussed with the patient and how to seek care for testing (follow up with PCP or arrange E-visit).   The importance of social distancing and COVID-19 vaccination was discussed today. 2 min The patient is practicing social distancing & Masking.   I spent a total of 28 minutes with the patient spent in direct patient consultation.  Additional time spent with chart review  / charting (studies, outside notes, etc): 14  Total Time: 42 min  Current medicines are reviewed at length with the patient today.  (+/- concerns) n/a  This visit occurred during the SARS-CoV-2 public health emergency.  Safety protocols were in place, including screening questions prior to the visit, additional usage of staff PPE, and extensive cleaning of exam room while observing appropriate contact time as indicated for disinfecting solutions.  Notice: This dictation was prepared with Dragon dictation along with smaller phrase technology. Any transcriptional errors that result from this process are unintentional and may not be corrected upon review.  Patient Instructions / Medication Changes & Studies & Tests Ordered   Patient Instructions  Medication Instructions:  INCREASE TO  60 MG IMDUR DAILY  *If you need a refill on your cardiac medications before your next appointment, please call your pharmacy*   Lab Work: NOT NEEDED If you have labs (blood work) drawn today and your tests are completely normal, you will receive your results only by: Marland Kitchen MyChart Message (if you have MyChart) OR . A paper copy in the mail If you have any lab test that is abnormal or we need to change your treatment, we will call you to review the results.   Testing/Procedures: WILL BE SCHEDULE FOR Your physician  has requested that you have a lexiscan myoview. Please follow instruction sheet, as given.    Follow-Up: At [facility], you and your health needs are our priority.  As part of our continuing mission to provide you with exceptional heart care, we have created designated Provider Care Teams.  These Care Teams include your primary Cardiologist (physician) and Advanced Practice Providers (APPs -  Physician Assistants and Nurse Practitioners) who all work together to provide you with the care you need, when you need it.  We recommend signing up for the patient portal called "MyChart".  Sign up information is provided on this After Visit Summary.  MyChart is used to connect with patients for Virtual Visits (Telemedicine).  Patients are able to view lab/test results, encounter notes, upcoming appointments, etc.  Non-urgent messages can be sent to your provider as well.   To learn more about what you can do with MyChart, go to [email or web address].    Your next appointment:   1 TO 2  month(s)  The format for your next appointment:   In Person  Provider:   Glenetta Hew, MD    Studies Ordered:   Orders Placed This Encounter  Procedures  . Cardiac Stress Test: Informed Consent Details: Physician/Practitioner Attestation; Transcribe to consent form and obtain patient signature  . MYOCARDIAL PERFUSION IMAGING  . EKG 12-Lead     Glenetta Hew, M.D., M.S. Interventional Cardiologist   Pager # [phone] Phone # [phone] [address] [place], [place] [address]   Thank you for choosing Heartcare at [facility]!!

## 2020-06-24 NOTE — Telephone Encounter (Signed)
Patient seen today 06/24/20

## 2020-06-24 NOTE — Patient Instructions (Signed)
Medication Instructions:  INCREASE TO  60 MG IMDUR DAILY  *If you need a refill on your cardiac medications before your next appointment, please call your pharmacy*   Lab Work: NOT NEEDED If you have labs (blood work) drawn today and your tests are completely normal, you will receive your results only by: Marland Kitchen MyChart Message (if you have MyChart) OR . A paper copy in the mail If you have any lab test that is abnormal or we need to change your treatment, we will call you to review the results.   Testing/Procedures: WILL BE SCHEDULE FOR Your physician has requested that you have a lexiscan myoview. Please follow instruction sheet, as given.    Follow-Up: At [facility], you and your health needs are our priority.  As part of our continuing mission to provide you with exceptional heart care, we have created designated Provider Care Teams.  These Care Teams include your primary Cardiologist (physician) and Advanced Practice Providers (APPs -  Physician Assistants and Nurse Practitioners) who all work together to provide you with the care you need, when you need it.  We recommend signing up for the patient portal called "MyChart".  Sign up information is provided on this After Visit Summary.  MyChart is used to connect with patients for Virtual Visits (Telemedicine).  Patients are able to view lab/test results, encounter notes, upcoming appointments, etc.  Non-urgent messages can be sent to your provider as well.   To learn more about what you can do with MyChart, go to [email or web address].    Your next appointment:   1 TO 2  month(s)  The format for your next appointment:   In Person  Provider:   Glenetta Hew, MD

## 2020-06-24 NOTE — Telephone Encounter (Signed)
Spoke with patient who states that she has had Sharp Pain in the center of her chest intermittently for the last 2 weeks. Patient states that she has Shortness of breath during these sharp chest pain episodes and has to sit down, and they pass. Patient states she is not actively having the sharp chest pain but states that she does have a little soreness in her chest. Patient is not currently short of breath or having any other symptoms. Patient states that yesterday she had an episode where she felt as if her heart was racing and was pounding. Patient states that her Blood pressure yesterday was 90/65 with Heart rate of 100. Patient states that her BP was below her baseline yesterday. Patient states that currently her Blood pressure is 153/86 with Heart rate 79 after taking medication 1 hour ago. Patient states that she is currently taking her Imdur 30 mg daily but has not taken any Nitro for the chest pain. Patient does state that she is unsure of what could be causing her symptoms but does endorse being under a lot of stress recently. Patient would like to know if she should come in to be seen sooner than her February appointment.   Made patient an appointment to be seen today 11/29 with Dr. Ellyn Hack at 11:20. Advised patient to arrive 41mns prior to appointment time. Advised patient to seek emergency medical treatment if symptoms were worse prior to Office Visit. Patient verbalized understanding of all instructions.

## 2020-06-24 NOTE — Telephone Encounter (Signed)
Pt c/o of Chest Pain: STAT if CP now or developed within 24 hours  1. Are you having CP right now? Patient states she is experiencing mild CP this morning   2. Are you experiencing any other symptoms (ex. SOB, nausea, vomiting, sweating)? SOB  3. How long have you been experiencing CP? About 1 week, per patient   4. Is your CP continuous or coming and going? Coming and going   5. Have you taken Nitroglycerin? No    Pt c/o Shortness Of Breath: STAT if SOB developed within the last 24 hours or pt is noticeably SOB on the phone  1. Are you currently SOB (can you hear that pt is SOB on the phone)? No  2. How long have you been experiencing SOB? Developed about 2 weeks ago, per patient  3. Are you SOB when sitting or when up moving around? When up and moving around   4. Are you currently experiencing any other symptoms? No    ?

## 2020-06-25 LAB — COMPLETE METABOLIC PANEL WITH GFR
AG Ratio: 1.1 (calc) (ref 1.0–2.5)
ALT: 18 U/L (ref 6–29)
AST: 18 U/L (ref 10–35)
Albumin: 3.8 g/dL (ref 3.6–5.1)
Alkaline phosphatase (APISO): 168 U/L — ABNORMAL HIGH (ref 37–153)
BUN: 18 mg/dL (ref 7–25)
CO2: 29 mmol/L (ref 20–32)
Calcium: 9.6 mg/dL (ref 8.6–10.4)
Chloride: 93 mmol/L — ABNORMAL LOW (ref 98–110)
Creat: 0.81 mg/dL (ref 0.50–0.99)
GFR, Est African American: 86 mL/min/{1.73_m2} (ref >=60)
GFR, Est Non African American: 74 mL/min/{1.73_m2} (ref >=60)
Globulin: 3.5 g/dL (calc) (ref 1.9–3.7)
Glucose, Bld: 245 mg/dL — ABNORMAL HIGH (ref 65–99)
Potassium: 4.9 mmol/L (ref 3.5–5.3)
Sodium: 132 mmol/L — ABNORMAL LOW (ref 135–146)
Total Bilirubin: 0.4 mg/dL (ref 0.2–1.2)
Total Protein: 7.3 g/dL (ref 6.1–8.1)

## 2020-06-25 LAB — CBC WITH DIFFERENTIAL/PLATELET
Absolute Monocytes: 657 cells/uL (ref 200–950)
Basophils Absolute: 106 cells/uL (ref 0–200)
Basophils Relative: 1 %
Eosinophils Absolute: 435 cells/uL (ref 15–500)
Eosinophils Relative: 4.1 %
HCT: 41.4 % (ref 35.0–45.0)
Hemoglobin: 14 g/dL (ref 11.7–15.5)
Lymphs Abs: 2025 cells/uL (ref 850–3900)
MCH: 30.8 pg (ref 27.0–33.0)
MCHC: 33.8 g/dL (ref 32.0–36.0)
MCV: 91 fL (ref 80.0–100.0)
MPV: 10.1 fL (ref 7.5–12.5)
Monocytes Relative: 6.2 %
Neutro Abs: 7378 cells/uL (ref 1500–7800)
Neutrophils Relative %: 69.6 %
Platelets: 289 10*3/uL (ref 140–400)
RBC: 4.55 10*6/uL (ref 3.80–5.10)
RDW: 11.8 % (ref 11.0–15.0)
Total Lymphocyte: 19.1 %
WBC: 10.6 10*3/uL (ref 3.8–10.8)

## 2020-06-25 LAB — LIPID PANEL
Cholesterol: 164 mg/dL (ref <200)
HDL: 45 mg/dL — ABNORMAL LOW (ref >=50)
LDL Cholesterol (Calc): 81 mg/dL (calc)
Non-HDL Cholesterol (Calc): 119 mg/dL (calc) (ref <130)
Total CHOL/HDL Ratio: 3.6 (calc) (ref <5.0)
Triglycerides: 317 mg/dL — ABNORMAL HIGH (ref <150)

## 2020-06-25 LAB — HEMOGLOBIN A1C
Hgb A1c MFr Bld: 8.4 % of total Hgb — ABNORMAL HIGH (ref <5.7)
Mean Plasma Glucose: 194 (calc)
eAG (mmol/L): 10.8 (calc)

## 2020-06-25 LAB — TSH: TSH: 1.24 mIU/L (ref 0.40–4.50)

## 2020-06-25 LAB — MAGNESIUM: Magnesium: 1.9 mg/dL (ref 1.5–2.5)

## 2020-06-26 ENCOUNTER — Encounter: Payer: Self-pay | Admitting: Cardiology

## 2020-06-26 ENCOUNTER — Other Ambulatory Visit: Payer: Self-pay | Admitting: Adult Health

## 2020-06-26 DIAGNOSIS — Z79899 Other long term (current) drug therapy: Secondary | ICD-10-CM

## 2020-06-26 DIAGNOSIS — E1151 Type 2 diabetes mellitus with diabetic peripheral angiopathy without gangrene: Secondary | ICD-10-CM

## 2020-06-26 NOTE — Assessment & Plan Note (Addendum)
She is taking Crestor 3 days a week.  Last labs showed LDL 59 however rechecked by her PCP the day of this visit.  I have reviewed labs above, LDL is up now into the 80s. We will await results of Myoview, if there is significant disease change, would potentially add Zetia to get LDL back down to below 70.

## 2020-06-26 NOTE — Assessment & Plan Note (Signed)
Relatively concerning chest pain for possible anginal equivalent.  I think is not reasonable to evaluate for ischemia since she does have known CAD.  Although it was nonobstructive, it was least moderate to severe.  Plan: Check Myoview.  If nonischemic, would consider the possibility of microvascular disease.  In that case may want to consider calcium channel blocker such as amlodipine in place of lisinopril.

## 2020-06-26 NOTE — Assessment & Plan Note (Addendum)
She had moderate FFR negative disease of the RCA with recent symptoms of post exertional chest discomfort and dyspnea.  Difficult to tell, because her history giving is relatively poor.  She is currently only on Imdur and lisinopril.  Depending on what her stress test looks like, would probably have to take Inderal/propranolol on a more daily basis.  Plan: Increase Imdur to 60 mg,   Lexiscan Myoview (she clearly cannot walk treadmill with her knees and hip pain.  Pending results of stress test, tentatively have her start back on Inderal as a regular medication.

## 2020-06-26 NOTE — Assessment & Plan Note (Signed)
We talked about cessation briefly.  She knows she needs to stop, but at this point is more active in discussing her chest pain.

## 2020-06-26 NOTE — Assessment & Plan Note (Signed)
Interestingly she has had a couple of lows of blood pressure.  Currently 140/60, but at low pressures yesterday.  I am leery of adjusting medications too much at this point.  She is on lisinopril 2.5 mg and on as needed propranolol.  This is in addition to Imdur 30 mg.  Plan: Increase Imdur to 60 mg.  Recommend that she actually does take the Inderal (propranolol) on a more regular basis.

## 2020-06-28 ENCOUNTER — Telehealth (HOSPITAL_COMMUNITY): Payer: Self-pay | Admitting: *Deleted

## 2020-06-28 NOTE — Telephone Encounter (Signed)
Close encounter 

## 2020-07-03 ENCOUNTER — Ambulatory Visit (HOSPITAL_COMMUNITY)
Admission: RE | Admit: 2020-07-03 | Discharge: 2020-07-03 | Disposition: A | Discharge status: Home / Self Care | Payer: PPO | Source: Ambulatory Visit | Attending: Cardiovascular Disease | Admitting: Cardiovascular Disease

## 2020-07-03 ENCOUNTER — Other Ambulatory Visit: Payer: Self-pay

## 2020-07-03 DIAGNOSIS — R079 Chest pain, unspecified: Secondary | ICD-10-CM | POA: Insufficient documentation

## 2020-07-03 DIAGNOSIS — R0789 Other chest pain: Secondary | ICD-10-CM | POA: Diagnosis not present

## 2020-07-03 DIAGNOSIS — I2510 Atherosclerotic heart disease of native coronary artery without angina pectoris: Secondary | ICD-10-CM | POA: Insufficient documentation

## 2020-07-03 HISTORY — PX: NM MYOVIEW LTD: HXRAD82

## 2020-07-03 LAB — MYOCARDIAL PERFUSION IMAGING
LV dias vol: 51 mL (ref 46–106)
LV sys vol: 10 mL
Peak HR: 85 {beats}/min
Rest HR: 75 {beats}/min
SDS: 1
SRS: 0
SSS: 1
TID: 1

## 2020-07-03 MED ORDER — AMINOPHYLLINE 25 MG/ML IV SOLN
75.0000 mg | Freq: Once | INTRAVENOUS | Status: AC
  Administered 2020-07-03: 75 mg via INTRAVENOUS

## 2020-07-03 MED ORDER — REGADENOSON 0.4 MG/5ML IV SOLN
0.4000 mg | Freq: Once | INTRAVENOUS | Status: AC
  Administered 2020-07-03: 0.4 mg via INTRAVENOUS

## 2020-07-03 MED ORDER — TECHNETIUM TC 99M TETROFOSMIN IV KIT
11.0000 | PACK | Freq: Once | INTRAVENOUS | Status: AC | PRN
  Administered 2020-07-03: 11 via INTRAVENOUS
  Filled 2020-07-03: qty 11

## 2020-07-03 MED ORDER — TECHNETIUM TC 99M TETROFOSMIN IV KIT
31.9000 | PACK | Freq: Once | INTRAVENOUS | Status: AC | PRN
  Administered 2020-07-03: 31.9 via INTRAVENOUS
  Filled 2020-07-03: qty 32

## 2020-07-22 ENCOUNTER — Other Ambulatory Visit: Payer: Self-pay | Admitting: Adult Health

## 2020-07-23 ENCOUNTER — Ambulatory Visit
Admission: RE | Admit: 2020-07-23 | Discharge: 2020-07-23 | Disposition: A | Discharge status: Home / Self Care | Payer: PPO | Source: Ambulatory Visit | Attending: Adult Health | Admitting: Adult Health

## 2020-07-23 DIAGNOSIS — I2510 Atherosclerotic heart disease of native coronary artery without angina pectoris: Secondary | ICD-10-CM | POA: Diagnosis not present

## 2020-07-23 DIAGNOSIS — F17200 Nicotine dependence, unspecified, uncomplicated: Secondary | ICD-10-CM

## 2020-07-23 DIAGNOSIS — F17210 Nicotine dependence, cigarettes, uncomplicated: Secondary | ICD-10-CM

## 2020-07-23 DIAGNOSIS — Z122 Encounter for screening for malignant neoplasm of respiratory organs: Secondary | ICD-10-CM

## 2020-07-23 DIAGNOSIS — D7389 Other diseases of spleen: Secondary | ICD-10-CM | POA: Diagnosis not present

## 2020-07-23 DIAGNOSIS — J432 Centrilobular emphysema: Secondary | ICD-10-CM | POA: Diagnosis not present

## 2020-08-20 ENCOUNTER — Ambulatory Visit: Payer: PPO | Admitting: Cardiology

## 2020-08-22 DIAGNOSIS — M7061 Trochanteric bursitis, right hip: Secondary | ICD-10-CM | POA: Diagnosis not present

## 2020-08-22 DIAGNOSIS — S72001A Fracture of unspecified part of neck of right femur, initial encounter for closed fracture: Secondary | ICD-10-CM | POA: Diagnosis not present

## 2020-08-22 DIAGNOSIS — S72143A Displaced intertrochanteric fracture of unspecified femur, initial encounter for closed fracture: Secondary | ICD-10-CM | POA: Diagnosis not present

## 2020-08-22 DIAGNOSIS — M25551 Pain in right hip: Secondary | ICD-10-CM | POA: Diagnosis not present

## 2020-08-29 ENCOUNTER — Other Ambulatory Visit: Payer: Self-pay | Admitting: Adult Health

## 2020-09-03 ENCOUNTER — Encounter: Payer: Self-pay | Admitting: Cardiology

## 2020-09-03 ENCOUNTER — Other Ambulatory Visit: Payer: Self-pay

## 2020-09-03 ENCOUNTER — Ambulatory Visit: Payer: PPO | Admitting: Cardiology

## 2020-09-03 DIAGNOSIS — R079 Chest pain, unspecified: Secondary | ICD-10-CM | POA: Diagnosis not present

## 2020-09-03 DIAGNOSIS — I2510 Atherosclerotic heart disease of native coronary artery without angina pectoris: Secondary | ICD-10-CM | POA: Diagnosis not present

## 2020-09-03 DIAGNOSIS — E1169 Type 2 diabetes mellitus with other specified complication: Secondary | ICD-10-CM | POA: Diagnosis not present

## 2020-09-03 DIAGNOSIS — E785 Hyperlipidemia, unspecified: Secondary | ICD-10-CM | POA: Diagnosis not present

## 2020-09-03 DIAGNOSIS — I10 Essential (primary) hypertension: Secondary | ICD-10-CM

## 2020-09-03 NOTE — Progress Notes (Signed)
Primary Care Provider: Unk Pinto, MD Cardiologist: Glenetta Hew, MD Electrophysiologist: None  Clinic Note: Chief Complaint  Patient presents with  . Follow-up    Test results    Problem List Items Addressed This Visit    Essential hypertension (Chronic)    She really has labile pressures now. She has had some elevated pressures but now is having pressures in the 90s. She is taking one half dose of lisinopril Suggested that she could potentially hold but if she is feeling lightheaded or dizzy. Actually, if additional blood pressure requirement is necessary, would probably make the Inderal a standing dose.      Hyperlipidemia associated with type 2 diabetes mellitus (HCC) (Chronic)    She is taking 10 mg rosuvastatin 3 days a week.  LDL 81.  Given the fact that these findings nonischemic, would only be as aggressive, but would like to try to see the LDL below 70.  Low threshold to consider adding Zetia      Coronary artery disease, non-occlusive: RCA 60%, FFR Negative. (Chronic)    Moderate, FFR negative RCA disease. Myoview negative for ischemia.  At this point I think we can try to see if he can wean her off the Imdur and use it more on a as needed basis concept.   Corlanor lisinopril, low threshold to consider switching to just having propranolol as a daily medication      Chest pain with low risk for cardiac etiology (Chronic)    I do suspect that her chest pain was probably musculoskeletal in nature.  Probably related to bending down holding onto the walker and putting her weight on the walker.  Thankfully, no more pain and nonischemic Myoview.         HPI:    Sara Lamb is a 70 y.o. female with a PMH borderline single-vessel disease (proximal RCA treated medically), DM-2, HTN and HLD along with prior tobacco abuse who presents today for Follow-Up Visit after Evaluation ofChest Pain.  - Jan 2021 - R Hip ([place]em[place]) ORIF w/ intertrochanteric nail as a  result of a fall related to loss of balance while suffering from UTI. Was noted to have poorly controlled diabetes-A1c 12.9. Apparently she had a fall while at rehab and injured her knee. Now trying to avoid surgery. -> She was having intermittent episodes of chest discomfort and taking bisoprolol that was changed to propranolol for tremors.  Sara Lamb was last seen on 06/24/2020 as a walk-in visit to evaluate chest pain. She describes sharp central left-sided chest pain over the last couple weeks, associated with dyspnea. It is a blood pressures in the 90s with heart rates in the 100s when she was here to use nitroglycerin. -> She was try to exercise more doing more walking, using a walker. Still having some chest discomfort. Uses shopping carts at stores. Only able to do household chores from 95 to 10 minutes without having to stop for rest.  Increased Imdur to 60 mg;   Lexiscan Myoview ordered  Recent Hospitalizations: None  Reviewed  CV studies:    The following studies were reviewed today: (if available, images/films reviewed: From Epic Chart or Care Everywhere) . Myoview 07/03/2020: EF>65%.  No EKG changes.  LOW RISK.  No ischemia or infarction.   Interval History:   Sara Lamb presents here today indicating that she really has not had any further chest pain since last visit. She is now walking with a walker more limited by her  hips and knees but they are doing better. May be as good as he can get now. She thinks a lot of discomfort she was having because leaning over and holding onto the walker. She is a little deconditioned so she will get somewhat short of breath with walking, but is doing better now. She still sleeps on 2 pillows for comfort not for orthopnea, no PND. Trivial edema. More swelling on the right leg from the recent surgeries. The swelling goes down with an ice pack and leg elevation. Slowly getting back into some baseline level of activity, but is definitely  deconditioned and limited by her leg pain.  CV Review of Symptoms (Summary): positive for - edema and R Leg worse negative for - chest pain, dyspnea on exertion, irregular heartbeat, orthopnea, palpitations, paroxysmal nocturnal dyspnea, rapid heart rate, shortness of breath or Syncope/near syncope, TIA, versus fugax, claudication    The patient does not have symptoms concerning for COVID-19 infection (fever, chills, cough, or new shortness of breath).   REVIEWED OF SYSTEMS   Review of Systems  Constitutional: Positive for malaise/fatigue. Negative for weight loss (Has actually gained weight since she is reinjured her knee.).  HENT: Negative for congestion and nosebleeds.   Respiratory: Negative for cough and shortness of breath (Per HPI).   Cardiovascular: Positive for leg swelling (R leg - post-op).  Gastrointestinal: Negative for abdominal pain, blood in stool and melena.  Genitourinary: Negative for hematuria.  Musculoskeletal: Positive for joint pain (Right knee and hip pain). Negative for falls (She has had couple close calls but no true falls).  Neurological: Positive for dizziness and focal weakness (Right leg). Negative for headaches.  Psychiatric/Behavioral: Negative for depression (Has some features, more dysthymic) and memory loss. The patient is nervous/anxious and has insomnia.        Lots of social stress   I have reviewed and (if needed) personally updated the patient's problem list, medications, allergies, past medical and surgical history, social and family history.   PAST MEDICAL HISTORY   Past Medical History:  Diagnosis Date  . Anxiety   . Anxiety disorder   . Cirrhosis ([place])   . COPD (chronic obstructive pulmonary disease) ([place])    followed by pcp--  last exacerbation 11/ 2017  . Coronary artery disease, non-occlusive 03/2018   Cardiac Cath: Moderate (60%) proximal-mid RCA (FFR 0.89 => NEGATIVE / not physiologically significant).  Normal EF and EDP.  .  Depression    Patient denies  . Gastritis   . History of rib fracture    2004  . 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 Hyperlipidemia   . Hypertension   . OA (osteoarthritis)    back, hips, knees, feet  . Osteoporosis   . Polyp of colon, hyperplastic   . Pulmonary nodules    per CT chest 08-28-2016  -- bilateral upper lobe nodules  . S/P right hip fracture 09/06/2019  . Stroke ([place])   . Tubular adenoma of colon   . Type 2 diabetes mellitus ([place])    followed by pcp--  last A1c 9.1 on 08-01-2016  . UTI (urinary tract infection)   . Wears dentures     PAST SURGICAL HISTORY   Past Surgical History:  Procedure Laterality  Date  . BILATERAL SALPINGOOPHORECTOMY  1997   via Laparoscopy  . CHOLECYSTECTOMY N/A 09/30/2016   Procedure: LAPAROSCOPIC CHOLECYSTECTOMY;  Surgeon: Mickeal Skinner, MD;  Location: [facility];  Service: General;  Laterality: N/A;  . INTRAVASCULAR PRESSURE WIRE/FFR STUDY N/A 04/12/2018   Procedure: INTRAVASCULAR PRESSURE WIRE/FFR STUDY;  Surgeon: Leonie Man, MD;  Location: [facility];  Service: Cardiovascular;  Laterality: N/A;  . KNEE ARTHROSCOPY Right 11/2014  . LEFT HEART CATH AND CORONARY ANGIOGRAPHY N/A 04/12/2018   Procedure: LEFT HEART CATH AND CORONARY ANGIOGRAPHY;  Surgeon: Leonie Man, MD;  Location: [facility];  Service: Cardiovascular;  Laterality: N/A;  . NM MYOVIEW LTD  07/03/2020    EF>65%.  No EKG changes.  LOW RISK.  No ischemia or infarction.  . TRANSTHORACIC ECHOCARDIOGRAM  02/2018    Normal LV function.  EF 60 to 65%.  No RWMMA.  GRII DD.  Moderate LA dilation.  Marland Kitchen VAGINAL HYSTERECTOMY  age 22    Immunization History  Administered Date(s) Administered  . Hep A / Hep B 01/07/2017, 02/08/2017,  07/09/2017  . Td 06/26/2001  . Tdap 04/30/2015    MEDICATIONS/ALLERGIES   Current Meds  Medication Sig  . acetaminophen (TYLENOL) 500 MG tablet Take 500 mg by mouth as directed. Take 2 tablets three times ad ay as needed  . albuterol (PROVENTIL) (2.5 MG/3ML) 0.083% nebulizer solution Take 3 mLs (2.5 mg total) by nebulization every 6 (six) hours as needed for wheezing or shortness of breath.  . ALPRAZolam (XANAX) 1 MG tablet Take 1/2 - 1 tablet two to three times daily ONLY if needed for Anxiety Attack & limit to 5 days /week to avoid Addiction & Dementia  . aspirin EC 81 MG tablet Take 81 mg by mouth daily.  Marland Kitchen escitalopram (LEXAPRO) 5 MG tablet Take 1 tablet (5 mg total) by mouth daily.  . furosemide (LASIX) 40 MG tablet Take 1/2 to 1 tablet Daily if needed ofr Fluid Retention / leg/ankle swelling  . gabapentin (NEURONTIN) 100 MG capsule Take 1 capsule (100 mg total) by mouth 3 (three) times daily.  . Insulin Pen Needle (CAREFINE PEN NEEDLES) 32G X 4 MM MISC Use 1x a day  . nitroGLYCERIN (NITROSTAT) 0.4 MG SL tablet Place 1 tablet (0.4 mg total) under the tongue every 5 (five) minutes as needed for chest pain.  . rosuvastatin (CRESTOR) 20 MG tablet Take 1 tablet 3 x /week on Mon Wed & Fri  . [DISCONTINUED] glimepiride (AMARYL) 4 MG tablet Take      1 tablet     2 x /day      with Meals       for Diabetes  . [DISCONTINUED] insulin glargine (LANTUS) 100 UNIT/ML Solostar Pen Inject 20 Units into the skin daily.  . [DISCONTINUED] isosorbide mononitrate (IMDUR) 30 MG 24 hr tablet Take 2 tablets (60 mg total) by mouth daily.  . [DISCONTINUED] lisinopril (ZESTRIL) 5 MG tablet Take      1/2 to 1 tablet       at Night      for BP  . [DISCONTINUED] propranolol ER (INDERAL LA) 60 MG 24 hr capsule Take 60 mg by mouth as needed.    Allergies  Allergen Reactions  . Actos [Pioglitazone] Other (See Comments)    Patient stated that it elevated her BGL  . Glyburide Other (See Comments)    Patient stated  that it elevated her BGL  . Metformin And Related     "  metformin caused mini-strokes" and ELEVATED BS and also GLYBURIDE, ONGLYZA, ACTOS, INVOKANA caused elevated BS  . Onglyza [Saxagliptin] Other (See Comments)    Patient stated that it elevated her BGL  . Insulins Rash  . Janumet [Sitagliptin-Metformin Hcl] Rash and Other (See Comments)    Shakes/ rash  . Penicillins Rash and Other (See Comments)    SOCIAL HISTORY/FAMILY HISTORY   Reviewed in Epic:  Pertinent findings: She notes having lots of increased social stress.  Seems very anxious today.  OBJCTIVE -PE, EKG, labs   Wt Readings from Last 3 Encounters:  09/03/20 139 lb 9.6 oz (63.3 kg)  07/03/20 135 lb (61.2 kg)  06/24/20 135 lb (61.2 kg)    Physical Exam: BP (!) 110/52   Pulse 76   Ht 4' 10"  (1.473 m)   Wt 139 lb 9.6 oz (63.3 kg)   BMI 29.18 kg/m  Physical Exam Vitals reviewed.  Constitutional:      General: She is not in acute distress.    Appearance: Normal appearance. She is normal weight. She is not ill-appearing or toxic-appearing.  HENT:     Head: Normocephalic and atraumatic.  Neck:     Vascular: No carotid bruit, hepatojugular reflux or JVD.  Cardiovascular:     Rate and Rhythm: Normal rate and regular rhythm.  No extrasystoles are present.    Chest Wall: PMI is not displaced.     Pulses: Normal pulses.     Heart sounds: Normal heart sounds. No murmur heard. No friction rub. No gallop.   Pulmonary:     Effort: Pulmonary effort is normal. No respiratory distress.     Breath sounds: Normal breath sounds.  Chest:     Chest wall: No tenderness (Some point tenderness, but not reproducing her pain).  Musculoskeletal:        General: Swelling (Right knee and right ankle 2+, minimal left sided swelling) and signs of injury (Right knee is swollen and tender) present.     Cervical back: Normal range of motion and neck supple.     Comments: Reduced range of motion of right leg  Neurological:     General: No  focal deficit present.     Mental Status: She is alert and oriented to person, place, and time. Mental status is at baseline.  Psychiatric:        Behavior: Behavior normal.        Thought Content: Thought content normal.        Judgment: Judgment normal.     Comments: Somewhat anxious mood and affect.     Adult ECG Report  n/a  Recent Labs: Reviewed Lab Results  Component Value Date   CHOL 164 06/24/2020   HDL 45 (L) 06/24/2020   LDLCALC 81 06/24/2020   TRIG 317 (H) 06/24/2020   CHOLHDL 3.6 06/24/2020   Lab Results  Component Value Date   CREATININE 0.81 06/24/2020   BUN 18 06/24/2020   NA 132 (L) 06/24/2020   K 4.9 06/24/2020   CL 93 (L) 06/24/2020   CO2 29 06/24/2020   Lab Results  Component Value Date   TSH 1.24 06/24/2020    ASSESSMENT/PLAN    Problem List Items Addressed This Visit    Essential hypertension (Chronic)    She really has labile pressures now. She has had some elevated pressures but now is having pressures in the 90s. She is taking one half dose of lisinopril Suggested that she could potentially hold but if she is feeling lightheaded  or dizzy. Actually, if additional blood pressure requirement is necessary, would probably make the Inderal a standing dose.      Hyperlipidemia associated with type 2 diabetes mellitus (HCC) (Chronic)    She is taking 10 mg rosuvastatin 3 days a week.  LDL 81.  Given the fact that these findings nonischemic, would only be as aggressive, but would like to try to see the LDL below 70.  Low threshold to consider adding Zetia      Coronary artery disease, non-occlusive: RCA 60%, FFR Negative. (Chronic)    Moderate, FFR negative RCA disease. Myoview negative for ischemia.  At this point I think we can try to see if he can wean her off the Imdur and use it more on a as needed basis concept.   Corlanor lisinopril, low threshold to consider switching to just having propranolol as a daily medication      Chest pain  with low risk for cardiac etiology (Chronic)    I do suspect that her chest pain was probably musculoskeletal in nature.  Probably related to bending down holding onto the walker and putting her weight on the walker.  Thankfully, no more pain and nonischemic Myoview.          COVID-19 Education: The signs and symptoms of COVID-19 were discussed with the patient and how to seek care for testing (follow up with PCP or arrange E-visit).   The importance of social distancing and COVID-19 vaccination was discussed today. 2 min The patient is practicing social distancing & Masking.   I spent a total of 21 minutes with the patient spent in direct patient consultation.  Additional time spent with chart review  / charting (studies, outside notes, etc): 12 Total Time: 33 min  Current medicines are reviewed at length with the patient today.  (+/- concerns) n/a  This visit occurred during the SARS-CoV-2 public health emergency.  Safety protocols were in place, including screening questions prior to the visit, additional usage of staff PPE, and extensive cleaning of exam room while observing appropriate contact time as indicated for disinfecting solutions.  Notice: This dictation was prepared with Dragon dictation along with smaller phrase technology. Any transcriptional errors that result from this process are unintentional and may not be corrected upon review.  Patient Instructions / Medication Changes & Studies & Tests Ordered   Patient Instructions  Medication Instructions:   stop taking Imdur after current bottle is empty   If you have a day for being lightheaded , you can hold in taking Lisinopril for that day.  *If you need a refill on your cardiac medications before your next appointment, please call your pharmacy*   Lab Work:  Not needed  Testing/Procedures: Not neeeded   Follow-Up: At [facility], you and your health needs are our priority.  As part of our continuing  mission to provide you with exceptional heart care, we have created designated Provider Care Teams.  These Care Teams include your primary Cardiologist (physician) and Advanced Practice Providers (APPs -  Physician Assistants and Nurse Practitioners) who all work together to provide you with the care you need, when you need it.  We recommend signing up for the patient portal called "MyChart".  Sign up information is provided on this After Visit Summary.  MyChart is used to connect with patients for Virtual Visits (Telemedicine).  Patients are able to view lab/test results, encounter notes, upcoming appointments, etc.  Non-urgent messages can be sent to your provider as well.  To learn more about what you can do with MyChart, go to [email or web address].    Your next appointment:   12 month(s)  The format for your next appointment:   In Person  Provider:   Glenetta Hew, MD   Studies Ordered:   No orders of the defined types were placed in this encounter.    Glenetta Hew, M.D., M.S. Interventional Cardiologist   Pager # [phone] Phone # [phone] [address]. [place], [place] [address]   Thank you for choosing Heartcare at [facility]!!

## 2020-09-03 NOTE — Patient Instructions (Signed)
Medication Instructions:   stop taking Imdur after current bottle is empty   If you have a day for being lightheaded , you can hold in taking Lisinopril for that day.  *If you need a refill on your cardiac medications before your next appointment, please call your pharmacy*   Lab Work:  Not needed  Testing/Procedures: Not neeeded   Follow-Up: At [facility], you and your health needs are our priority.  As part of our continuing mission to provide you with exceptional heart care, we have created designated Provider Care Teams.  These Care Teams include your primary Cardiologist (physician) and Advanced Practice Providers (APPs -  Physician Assistants and Nurse Practitioners) who all work together to provide you with the care you need, when you need it.  We recommend signing up for the patient portal called "MyChart".  Sign up information is provided on this After Visit Summary.  MyChart is used to connect with patients for Virtual Visits (Telemedicine).  Patients are able to view lab/test results, encounter notes, upcoming appointments, etc.  Non-urgent messages can be sent to your provider as well.   To learn more about what you can do with MyChart, go to [email or web address].    Your next appointment:   12 month(s)  The format for your next appointment:   In Person  Provider:   Glenetta Hew, MD

## 2020-09-16 ENCOUNTER — Other Ambulatory Visit: Payer: Self-pay

## 2020-09-16 DIAGNOSIS — E1151 Type 2 diabetes mellitus with diabetic peripheral angiopathy without gangrene: Secondary | ICD-10-CM

## 2020-09-16 DIAGNOSIS — I10 Essential (primary) hypertension: Secondary | ICD-10-CM

## 2020-09-16 DIAGNOSIS — Z79899 Other long term (current) drug therapy: Secondary | ICD-10-CM

## 2020-09-16 MED ORDER — PROPRANOLOL HCL ER 60 MG PO CP24
60.0000 mg | ORAL_CAPSULE | ORAL | 0 refills | Status: DC | PRN
Start: 2020-09-16 — End: 2020-12-03

## 2020-09-16 MED ORDER — GLIMEPIRIDE 4 MG PO TABS: ORAL_TABLET | ORAL | 0 refills | Status: DC

## 2020-09-16 MED ORDER — LISINOPRIL 5 MG PO TABS: ORAL_TABLET | ORAL | 0 refills | Status: DC

## 2020-09-16 MED ORDER — INSULIN GLARGINE 100 UNIT/ML SOLOSTAR PEN
20.0000 [IU] | PEN_INJECTOR | Freq: Every day | SUBCUTANEOUS | 0 refills | Status: DC
Start: 2020-09-16 — End: 2020-10-14

## 2020-09-22 ENCOUNTER — Encounter: Payer: Self-pay | Admitting: Cardiology

## 2020-09-22 NOTE — Assessment & Plan Note (Signed)
She is taking 10 mg rosuvastatin 3 days a week.  LDL 81.  Given the fact that these findings nonischemic, would only be as aggressive, but would like to try to see the LDL below 70.  Low threshold to consider adding Zetia

## 2020-09-22 NOTE — Assessment & Plan Note (Addendum)
She really has labile pressures now. She has had some elevated pressures but now is having pressures in the 90s. She is taking one half dose of lisinopril Suggested that she could potentially hold but if she is feeling lightheaded or dizzy. Actually, if additional blood pressure requirement is necessary, would probably make the Inderal a standing dose.

## 2020-09-22 NOTE — Assessment & Plan Note (Signed)
Moderate, FFR negative RCA disease. Myoview negative for ischemia.  At this point I think we can try to see if he can wean her off the Imdur and use it more on a as needed basis concept.   Corlanor lisinopril, low threshold to consider switching to just having propranolol as a daily medication

## 2020-09-22 NOTE — Assessment & Plan Note (Signed)
I do suspect that her chest pain was probably musculoskeletal in nature.  Probably related to bending down holding onto the walker and putting her weight on the walker.  Thankfully, no more pain and nonischemic Myoview.

## 2020-10-02 ENCOUNTER — Other Ambulatory Visit: Payer: Self-pay | Admitting: Adult Health

## 2020-10-11 DIAGNOSIS — E663 Overweight: Secondary | ICD-10-CM | POA: Insufficient documentation

## 2020-10-11 NOTE — Progress Notes (Signed)
70monthFOLLOW UP  Assessment:    Atherosclerosis of abdominal aorta (HCC) Control blood pressure, cholesterol, glucose, increase exercise.   CAD STOP SMOKING Follows with Cardiology, Dr HEllyn Hack  - had benign stress test 06/2020 Control blood pressure, cholesterol, glucose, increase exercise.   Essential hypertension Monitor blood pressure at home; call if consistently over 130/80 Continue DASH diet.   Reminder to go to the ER if any CP, SOB, nausea, dizziness, severe HA, changes vision/speech, left arm numbness and tingling and jaw pain. 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 If hyperglycemia persists follow up in office with log Denies any barriers. Discussed dietary and exercise modifications. Schedule diabetes eye exam  -     glimepiride (AMARYL) 4 MG tablet; TAKE ONE TABLET BY MOUTH TWICE DAILY BEFORE LUNCH AND SUPPER -     Insulin Glargine (LANTUS SOLOSTAR) 100 UNIT/ML Solostar Pen; Inject 20-30 Units into the skin at bedtime.  -     Hemoglobin A1c  Liver cirrhosis secondary to NASH ([facility] Follows with GI for this. -     COMPLETE METABOLIC PANEL WITH GFR  Chronic obstructive pulmonary disease, unspecified COPD type ([place] Denies sx; STOP smoking; monitor   Hyperlipidemia associated with type 2 diabetes mellitus ([place] Statin for LDL goal <70 Discussed dietary and exercise modifications. -     Lipid panel  Tobacco use disorder Discussed smoking cessation.  Has reduced quantity.  Has been using gum to help.  Reports she has tried chantix, not ready to quit. Will continue to assess readiness. -lung cancer screening 06/2020, due annually    Chronic hyponatremia Monitor;   -     COMPLETE METABOLIC PANEL WITH GFR  Anxiety Will try Cymbalta for possible pain benefit Follow up 8-12 weeks or sooner if concern with SE Stress management techniques discussed, increase water, good sleep hygiene discussed, increase exercise, and increase veggies.  -     ALPRAZolam (XANAX) 1 MG tablet; Take 1/2 to one tablet by mouth two or three times a day as needed for anxiety. Limit to five days per week. -     DULoxetine (CYMBALTA) 20 MG capsule; Take 1 capsule (20 mg total) by mouth daily.  Vitamin D deficiency -    Defer, off of supplement; restart taking  Poor compliance with medication Discussed this at length with patient.   Dysuria She reprots mild intermittent dysuria/suprapubic discomfort Check UA reflex culture to r/o UTI  Over 40 minutes of exam, counseling, chart review and critical decision making was performed Future Appointments  Date Time Provider [place] 03/13/2021  3:00 PM MGarnet Sierras NP [facility] None  06/25/2021  3:00 PM CLiane Comber NP [facility] None    Subjective:  Sara KELEHERis a 70y.o. MWF female who presents for 70 month follow up.  She has HTN, HLD, DMII with AA, NASH, Osteoporosis, Tobacco use, Vit D Defciency, COPD, chronic hyponatremia, anxiety and poor medication compliance.  She is very noncompliant with test, vaccines, and has an aversion to medication with " reactions" to the majority of medications.   She is s/p fall in Jan 2021 and fracture to her hip, continues to  use walker, 1 step at home, can manage with handrail. Has handicap shower installed with rail. Denies falls since Jan. Completed PT. Follows with [facility], taking tylenol, gabapentin.   She is on xanax 1 pill every night due to sleep and she will take another half due to stress and shaking. Also taking 1/2 tab in the afternoon due to shakes and anxiety. Very stressed due to husband's health. She has not tried  any daily agent. She reports trazodone made her more irritated/kept her awake, endorses severe swelling with lexapro (reports attempted twice with similar results), has taken celexa, wellbutrin  She has cirrhosis due to NASH, following with GI Dr. Havery Moros, EGD 01/2017 showed no varices.   She has COPD by imaging, denies dyspnea or secretions, continues to smoke, reports has reduced from 2-2.5 to 1.5 packs recently. Last CT chest 06/2020 showed small nodules ~3.3 mm recommended 12 month follow up, emphysematous changes and aortic atherosclerosis.   BMI is Body mass index is 29.26 kg/m., she has not been working on diet, reports does do daily stretch and strengthening exercises.  Wt Readings from Last 3 Encounters:  10/14/20 140 lb (63.5 kg)  09/03/20 139 lb 9.6 oz (63.3 kg)  07/03/20 135 lb (61.2 kg)   Had cath 2019 showed Prox RCA to Mid RCA lesion is 60% stenosed. FFR negative- following with Dr. Ellyn Hack.  Had low risk perfusion test 06/2020.    Marland Kitchen Her blood pressure has been controlled at home, today their BP is BP: 110/62 She does not workout. She denies chest pain, shortness of breath, dizziness.   She has aortic atherosclerosis per CT 12/2016 She is not on cholesterol medication (prescribed rosuvastatin 20 mg MWF but admits not taking, is willing to restart) and denies myalgias. Her cholesterol is not at goal. The cholesterol last visit was:   Lab Results  Component Value Date   CHOL 164 06/24/2020   HDL 45 (L) 06/24/2020   LDLCALC 81 06/24/2020   TRIG 317 (H) 06/24/2020   CHOLHDL 3.6 06/24/2020   She has had diabetes for 20 years. She has been working on diet and exercise for diabetes, and denies hypoglycemia , nausea, paresthesia of the feet, polydipsia, polyuria, visual disturbances, vomiting and weight loss.  Hx of intolerance with metformin, actos, januvia, onglyza.  She is prescribed glimepiride 4 mg BID, lantus 20 units PM.  Fasting reports recently elevated, previously  <140, recently 200+ for the last few weeks.  Lab Results  Component Value Date   HGBA1C 8.4 (H) 06/24/2020   Last GFR: Lab Results  Component Value Date   GFRNONAA 74 06/24/2020   Patient is on Vitamin D supplement, was taking 1000 units three days a week, GI upset with higher doses, stopped taking recently  Lab Results  Component Value Date   VD25OH 12 (L) 03/12/2020      Medication Review: Current Outpatient Medications on File Prior to Visit  Medication Sig Dispense Refill  . acetaminophen (TYLENOL) 500 MG tablet Take 500 mg by mouth as directed. Take 2 tablets three times ad ay as needed    . albuterol (PROVENTIL) (2.5 MG/3ML) 0.083% nebulizer solution Take 3 mLs (2.5 mg total) by nebulization every 6 (six) hours as needed for wheezing or shortness of breath. 75 mL 12  . ALPRAZolam (XANAX) 1 MG tablet Take 1/2 - 1 tablet two to three times daily ONLY if needed for Anxiety Attack & limit to 5 days/week to avoid Addiction & Dementia 60 tablet 0  .  aspirin EC 81 MG tablet Take 81 mg by mouth daily.    . furosemide (LASIX) 40 MG tablet Take 1/2 to 1 tablet Daily if needed ofr Fluid Retention / leg/ankle swelling 90 tablet 1  . gabapentin (NEURONTIN) 100 MG capsule Take 1 capsule (100 mg total) by mouth 3 (three) times daily. 270 capsule 1  . glimepiride (AMARYL) 4 MG tablet Take1 tablet 2 x /day with Meals for Diabetes 180 tablet 0  . insulin glargine (LANTUS) 100 UNIT/ML Solostar Pen Inject 20 Units into the skin daily. 15 mL 0  . Insulin Pen Needle (CAREFINE PEN NEEDLES) 32G X 4 MM MISC Use 1x a day 100 each 3  . lisinopril (ZESTRIL) 5 MG tablet Take 1/2 tablet at night for BP 90 tablet 0  . nitroGLYCERIN (NITROSTAT) 0.4 MG SL tablet Place 1 tablet (0.4 mg total) under the tongue every 5 (five) minutes as needed for chest pain. 25 tablet 5  . propranolol ER (INDERAL LA) 60 MG 24 hr capsule Take 1 capsule (60 mg total) by mouth as needed. 90 capsule 0  . rosuvastatin (CRESTOR) 20 MG  tablet Take 1 tablet 3 x /week on Mon Wed & Fri 39 tablet 0   No current facility-administered medications on file prior to visit.    Allergies  Allergen Reactions  . Actos [Pioglitazone] Other (See Comments)    Patient stated that it elevated her BGL  . Glyburide Other (See Comments)    Patient stated that it elevated her BGL  . Metformin And Related     "metformin caused mini-strokes" and ELEVATED BS and also GLYBURIDE, ONGLYZA, ACTOS, INVOKANA caused elevated BS  . Onglyza [Saxagliptin] Other (See Comments)    Patient stated that it elevated her BGL  . Insulins Rash  . Janumet [Sitagliptin-Metformin Hcl] Rash and Other (See Comments)    Shakes/ rash  . Penicillins Rash and Other (See Comments)    Current Problems (verified) Patient Active Problem List   Diagnosis Date Noted  . Overweight (BMI 25.0-29.9) 10/11/2020  . Chest pain with low risk for cardiac etiology 06/24/2020  . Type 2 diabetes mellitus ([place]) 11/24/2018  . Chronic hyponatremia 03/18/2018  . Coronary artery disease, non-occlusive: RCA 60%, FFR Negative. 02/16/2018  . Cognitive dysfunction 03/27/2017  . Liver cirrhosis secondary to NASH ([place]) 11/23/2016  . Osteoporosis 05/27/2016  . Atherosclerosis of abdominal aorta ([place]) 04/30/2016  . COPD (chronic obstructive pulmonary disease) ([place]) 04/30/2015  . Tobacco use disorder 04/08/2015  . Poor compliance with medication 11/05/2014  . Vitamin D deficiency   . Anxiety   . Essential hypertension   . Hyperlipidemia associated with type 2 diabetes mellitus ([place])     SURGICAL HISTORY She  has a past surgical history that includes Vaginal hysterectomy (age 26); Bilateral salpingoophorectomy (1997); Knee arthroscopy (Right, 11/2014); Cholecystectomy (N/A, 09/30/2016); LEFT HEART CATH AND CORONARY ANGIOGRAPHY (N/A, 04/12/2018); INTRAVASCULAR PRESSURE WIRE/FFR STUDY (N/A, 04/12/2018); transthoracic echocardiogram (02/2018); and NM MYOVIEW LTD (07/03/2020). FAMILY HISTORY Her  family history includes CAD in her maternal aunt, maternal uncle, and sister; CAD (age of onset: 9) in her mother; Cancer in her father; Coronary artery disease (age of onset: 32) in her brother; Diabetes in her brother, father, and mother; Heart attack in her maternal grandmother; Heart attack (age of onset: 37) in her sister; Hyperlipidemia in her brother, sister, and sister; Hypertension in her brother, father, and sister; Stomach cancer in her mother; Stroke in her mother.  SOCIAL HISTORY She  reports that she has  been smoking cigarettes. She started smoking about 53 years ago. She has a 58.50 pack-year smoking history. She has never used smokeless tobacco. She reports that she does not drink alcohol and does not use drugs.   Review of Systems  Constitutional: Negative for malaise/fatigue and weight loss.  HENT: Negative for hearing loss and tinnitus.   Eyes: Negative for blurred vision and double vision.  Respiratory: Negative for cough, sputum production, shortness of breath and wheezing.   Cardiovascular: Negative for chest pain, palpitations, orthopnea, claudication, leg swelling and PND.  Gastrointestinal: Negative for abdominal pain, blood in stool, constipation, diarrhea, heartburn, melena, nausea and vomiting.  Genitourinary: Negative.   Musculoskeletal: Positive for joint pain (R hip). Negative for falls and myalgias.  Skin: Negative for rash.  Neurological: Negative for dizziness, tingling, sensory change, weakness and headaches.  Endo/Heme/Allergies: Negative for polydipsia.  Psychiatric/Behavioral: Negative for depression, memory loss, substance abuse and suicidal ideas. The patient is nervous/anxious. The patient does not have insomnia.   All other systems reviewed and are negative.    Objective:     Today's Vitals   10/14/20 1426  BP: 110/62  Pulse: 79  Temp: (!) 97 F (36.1 C)  SpO2: 98%  Weight: 140 lb (63.5 kg)   Body mass index is 29.26 kg/m.  General  appearance: alert, no distress, WD/WN, female HEENT: normocephalic, sclerae anicteric, TMs pearly, nares patent, no discharge or erythema, pharynx normal Oral cavity: MMM, no lesions Neck: supple, no lymphadenopathy, no thyromegaly, no masses Heart: RRR, normal S1, S2, murmurs Lungs: CTA bilaterally, no wheezes, rhonchi, or rales Abdomen: +bs, soft, non tender, non distended, no masses, no hepatomegaly, no splenomegaly Musculoskeletal: no swelling, no obvious deformity, tender to light palpation to left lumbar. Extremities: no edema, no cyanosis Pulses: 2+ symmetric, upper and lower extremities, normal cap refill Neurological: alert, oriented x 3, CN2-12 intact, strength normal upper extremities and lower extremities, sensation normal throughout, DTRs 2+ throughout, gait slow steady with rolling walker Psychiatric: normal affect, behavior normal, pleasant     Izora Ribas, NP   10/14/2020

## 2020-10-14 ENCOUNTER — Encounter: Payer: Self-pay | Admitting: Adult Health

## 2020-10-14 ENCOUNTER — Other Ambulatory Visit: Payer: Self-pay

## 2020-10-14 ENCOUNTER — Ambulatory Visit (INDEPENDENT_AMBULATORY_CARE_PROVIDER_SITE_OTHER): Payer: PPO | Admitting: Adult Health

## 2020-10-14 VITALS — BP 110/62 | HR 79 | Temp 97.0°F | Wt 140.0 lb

## 2020-10-14 DIAGNOSIS — F17200 Nicotine dependence, unspecified, uncomplicated: Secondary | ICD-10-CM | POA: Diagnosis not present

## 2020-10-14 DIAGNOSIS — J449 Chronic obstructive pulmonary disease, unspecified: Secondary | ICD-10-CM | POA: Diagnosis not present

## 2020-10-14 DIAGNOSIS — K7460 Unspecified cirrhosis of liver: Secondary | ICD-10-CM | POA: Diagnosis not present

## 2020-10-14 DIAGNOSIS — E663 Overweight: Secondary | ICD-10-CM

## 2020-10-14 DIAGNOSIS — Z794 Long term (current) use of insulin: Secondary | ICD-10-CM | POA: Diagnosis not present

## 2020-10-14 DIAGNOSIS — I2510 Atherosclerotic heart disease of native coronary artery without angina pectoris: Secondary | ICD-10-CM

## 2020-10-14 DIAGNOSIS — E1159 Type 2 diabetes mellitus with other circulatory complications: Secondary | ICD-10-CM

## 2020-10-14 DIAGNOSIS — K7581 Nonalcoholic steatohepatitis (NASH): Secondary | ICD-10-CM

## 2020-10-14 DIAGNOSIS — I700 Atherosclerosis of aorta: Secondary | ICD-10-CM | POA: Diagnosis not present

## 2020-10-14 DIAGNOSIS — I10 Essential (primary) hypertension: Secondary | ICD-10-CM | POA: Diagnosis not present

## 2020-10-14 DIAGNOSIS — F419 Anxiety disorder, unspecified: Secondary | ICD-10-CM | POA: Diagnosis not present

## 2020-10-14 DIAGNOSIS — E1169 Type 2 diabetes mellitus with other specified complication: Secondary | ICD-10-CM | POA: Diagnosis not present

## 2020-10-14 DIAGNOSIS — E559 Vitamin D deficiency, unspecified: Secondary | ICD-10-CM

## 2020-10-14 DIAGNOSIS — R102 Pelvic and perineal pain: Secondary | ICD-10-CM | POA: Diagnosis not present

## 2020-10-14 DIAGNOSIS — E785 Hyperlipidemia, unspecified: Secondary | ICD-10-CM | POA: Diagnosis not present

## 2020-10-14 MED ORDER — DULOXETINE HCL 20 MG PO CPEP: 20.0000 mg | ORAL_CAPSULE | Freq: Every day | ORAL | 1 refills | Status: DC

## 2020-10-14 MED ORDER — INSULIN GLARGINE 100 UNIT/ML SOLOSTAR PEN
20.0000 [IU] | PEN_INJECTOR | Freq: Every day | SUBCUTANEOUS | 0 refills | Status: DC
Start: 2020-10-14 — End: 2020-12-03

## 2020-10-14 NOTE — Patient Instructions (Addendum)
Goals    . DIET - DECREASE SODA OR JUICE INTAKE     Water or unsweet tea is ok    . Fasting Blood Glucose <130     For every 3 days persistently above goal, increase lantus by 2 units. If any <100, reduce by 2 units.     . Quit Smoking        High-Fiber Eating Plan Fiber, also called dietary fiber, is a type of carbohydrate. It is found foods such as fruits, vegetables, whole grains, and beans. A high-fiber diet can have many health benefits. Your health care provider may recommend a high-fiber diet to help:  Prevent constipation. Fiber can make your bowel movements more regular.  Lower your cholesterol.  Relieve the following conditions: ? Inflammation of veins in the anus (hemorrhoids). ? Inflammation of specific areas of the digestive tract (uncomplicated diverticulosis). ? A problem of the large intestine, also called the colon, that sometimes causes pain and diarrhea (irritable bowel syndrome, or IBS).  Prevent overeating as part of a weight-loss plan.  Prevent heart disease, type 2 diabetes, and certain cancers. What are tips for following this plan? Reading food labels  Check the nutrition facts label on food products for the amount of dietary fiber. Choose foods that have 5 grams of fiber or more per serving.  The goals for recommended daily fiber intake include: ? Men (age 18 or younger): 34-38 g. ? Men (over age 51): 28-34 g. ? Women (age 73 or younger): 25-28 g. ? Women (over age 8): 22-25 g. Your daily fiber goal is _____________ g.   Shopping  Choose whole fruits and vegetables instead of processed forms, such as apple juice or applesauce.  Choose a wide variety of high-fiber foods such as avocados, lentils, oats, and kidney beans.  Read the nutrition facts label of the foods you choose. Be aware of foods with added fiber. These foods often have high sugar and sodium amounts per serving. Cooking  Use whole-grain flour for baking and cooking.  Cook with  brown rice instead of white rice. Meal planning  Start the day with a breakfast that is high in fiber, such as a cereal that contains 5 g of fiber or more per serving.  Eat breads and cereals that are made with whole-grain flour instead of refined flour or white flour.  Eat brown rice, bulgur wheat, or millet instead of white rice.  Use beans in place of meat in soups, salads, and pasta dishes.  Be sure that half of the grains you eat each day are whole grains. General information  You can get the recommended daily intake of dietary fiber by: ? Eating a variety of fruits, vegetables, grains, nuts, and beans. ? Taking a fiber supplement if you are not able to take in enough fiber in your diet. It is better to get fiber through food than from a supplement.  Gradually increase how much fiber you consume. If you increase your intake of dietary fiber too quickly, you may have bloating, cramping, or gas.  Drink plenty of water to help you digest fiber.  Choose high-fiber snacks, such as berries, raw vegetables, nuts, and popcorn. What foods should I eat? Fruits Berries. Pears. Apples. Oranges. Avocado. Prunes and raisins. Dried figs. Vegetables Sweet potatoes. Spinach. Kale. Artichokes. Cabbage. Broccoli. Cauliflower. Green peas. Carrots. Squash. Grains Whole-grain breads. Multigrain cereal. Oats and oatmeal. Brown rice. Barley. Bulgur wheat. [place]. Quinoa. Bran muffins. Popcorn. Rye wafer crackers. Meats and other proteins [employer]  beans, kidney beans, and pinto beans. Soybeans. Split peas. Lentils. Nuts and seeds. Dairy Fiber-fortified yogurt. Beverages Fiber-fortified soy milk. Fiber-fortified orange juice. Other foods Fiber bars. The items listed above may not be a complete list of recommended foods and beverages. Contact a dietitian for more information. What foods should I avoid? Fruits Fruit juice. Cooked, strained fruit. Vegetables Fried potatoes. Canned vegetables.  Well-cooked vegetables. Grains White bread. Pasta made with refined flour. White rice. Meats and other proteins Fatty cuts of meat. Fried chicken or fried fish. Dairy Milk. Yogurt. Cream cheese. Sour cream. Fats and oils Butters. Beverages Soft drinks. Other foods Cakes and pastries. The items listed above may not be a complete list of foods and beverages to avoid. Talk with your dietitian about what choices are best for you. Summary  Fiber is a type of carbohydrate. It is found in foods such as fruits, vegetables, whole grains, and beans.  A high-fiber diet has many benefits. It can help to prevent constipation, lower blood cholesterol, aid weight loss, and reduce your risk of heart disease, diabetes, and certain cancers.  Increase your intake of fiber gradually. Increasing fiber too quickly may cause cramping, bloating, and gas. Drink plenty of water while you increase the amount of fiber you consume.  The best sources of fiber include whole fruits and vegetables, whole grains, nuts, seeds, and beans. This information is not intended to replace advice given to you by your health care provider. Make sure you discuss any questions you have with your health care provider. Document Revised: 11/16/2019 Document Reviewed: 11/16/2019 Elsevier Patient Education  2021 [employer].

## 2020-10-16 LAB — COMPLETE METABOLIC PANEL WITH GFR
AG Ratio: 1.1 (calc) (ref 1.0–2.5)
ALT: 16 U/L (ref 6–29)
AST: 15 U/L (ref 10–35)
Albumin: 3.5 g/dL — ABNORMAL LOW (ref 3.6–5.1)
Alkaline phosphatase (APISO): 207 U/L — ABNORMAL HIGH (ref 37–153)
BUN/Creatinine Ratio: 24 (calc) — ABNORMAL HIGH (ref 6–22)
BUN: 24 mg/dL (ref 7–25)
CO2: 28 mmol/L (ref 20–32)
Calcium: 9.4 mg/dL (ref 8.6–10.4)
Chloride: 92 mmol/L — ABNORMAL LOW (ref 98–110)
Creat: 1 mg/dL — ABNORMAL HIGH (ref 0.50–0.99)
GFR, Est African American: 67 mL/min/{1.73_m2} (ref >=60)
GFR, Est Non African American: 57 mL/min/{1.73_m2} — ABNORMAL LOW (ref >=60)
Globulin: 3.1 g/dL (calc) (ref 1.9–3.7)
Glucose, Bld: 542 mg/dL (ref 65–99)
Potassium: 5.7 mmol/L — ABNORMAL HIGH (ref 3.5–5.3)
Sodium: 130 mmol/L — ABNORMAL LOW (ref 135–146)
Total Bilirubin: 0.2 mg/dL (ref 0.2–1.2)
Total Protein: 6.6 g/dL (ref 6.1–8.1)

## 2020-10-16 LAB — URINALYSIS W MICROSCOPIC + REFLEX CULTURE
Bilirubin Urine: NEGATIVE
Hyaline Cast: NONE SEEN /LPF
Ketones, ur: NEGATIVE
Nitrites, Initial: NEGATIVE
Specific Gravity, Urine: 1.023 (ref 1.001–1.03)
Squamous Epithelial / HPF: NONE SEEN /HPF (ref <=5)
pH: <=5 (ref 5.0–8.0)

## 2020-10-16 LAB — CULTURE INDICATED

## 2020-10-16 LAB — URINE CULTURE
MICRO NUMBER:: 11674817
SPECIMEN QUALITY:: ADEQUATE

## 2020-10-16 LAB — HEMOGLOBIN A1C
Hgb A1c MFr Bld: 11.4 % of total Hgb — ABNORMAL HIGH (ref <5.7)
Mean Plasma Glucose: 280 mg/dL
eAG (mmol/L): 15.5 mmol/L

## 2020-10-16 LAB — CBC WITH DIFFERENTIAL/PLATELET
Absolute Monocytes: 594 cells/uL (ref 200–950)
Basophils Absolute: 90 cells/uL (ref 0–200)
Basophils Relative: 1 %
Eosinophils Absolute: 396 cells/uL (ref 15–500)
Eosinophils Relative: 4.4 %
HCT: 39.4 % (ref 35.0–45.0)
Hemoglobin: 12.9 g/dL (ref 11.7–15.5)
Lymphs Abs: 1872 cells/uL (ref 850–3900)
MCH: 30.8 pg (ref 27.0–33.0)
MCHC: 32.7 g/dL (ref 32.0–36.0)
MCV: 94 fL (ref 80.0–100.0)
MPV: 10.5 fL (ref 7.5–12.5)
Monocytes Relative: 6.6 %
Neutro Abs: 6048 cells/uL (ref 1500–7800)
Neutrophils Relative %: 67.2 %
Platelets: 269 10*3/uL (ref 140–400)
RBC: 4.19 10*6/uL (ref 3.80–5.10)
RDW: 11.7 % (ref 11.0–15.0)
Total Lymphocyte: 20.8 %
WBC: 9 10*3/uL (ref 3.8–10.8)

## 2020-10-16 LAB — MAGNESIUM: Magnesium: 2.2 mg/dL (ref 1.5–2.5)

## 2020-10-16 LAB — LIPID PANEL
Cholesterol: 187 mg/dL (ref <200)
HDL: 39 mg/dL — ABNORMAL LOW (ref >=50)
Non-HDL Cholesterol (Calc): 148 mg/dL (calc) — ABNORMAL HIGH (ref <130)
Total CHOL/HDL Ratio: 4.8 (calc) (ref <5.0)
Triglycerides: 423 mg/dL — ABNORMAL HIGH (ref <150)

## 2020-10-16 LAB — TSH: TSH: 0.9 mIU/L (ref 0.40–4.50)

## 2020-10-25 NOTE — Progress Notes (Signed)
Assessment and Plan:  Sara Lamb was seen today for diabetes.  Diagnoses and all orders for this visit:  Type 2 diabetes mellitus with hyperglycemia, with long-term current use of insulin (HCC) Type 2 Diabetes Mellitus - poorly controlled Education: Reviewed 'ABCs' of diabetes management (respective goals in parentheses):  A1C (<7), blood pressure (<130/80), and cholesterol (LDL <70) Dietary recommendations Physical Activity recommendations - Strongly advisedto start checking sugars at different times of the day - check 2 times a day, rotating checks - given sugar log and advised how to fill it and to bring it at next appt  - given foot care handout and explained the principles  - given instructions for hypoglycemia management   Every 3 days if your morning sugar is consistently above 150 increase your lantus insulin by 1 units Reduce by 1 unit if any low glucose/contact office; sx and management for hypoglycemia discussed Remember a low sugar is much more dangerous than a high sugar.  TRY TO ADD ON WALKING, START LOW AT 20 MINS 2 DAYS A WEEK  -     BASIC METABOLIC PANEL WITH GFR -     Fructosamine  Medication management -     BASIC METABOLIC PANEL WITH GFR  Further disposition pending results of labs. Discussed med's effects and SE's.   Over 15 minutes of exam, counseling, chart review, and critical decision making was performed.   Future Appointments  Date Time Provider [place]  03/13/2021  3:00 PM Garnet Sierras, NP [facility] None  06/25/2021  3:00 PM Liane Comber, NP [facility] None    ------------------------------------------------------------------------------------------------------------------   HPI BP 124/68   Pulse 76   Temp (!) 96.6 F (35.9 C)   Ht 4' 10"  (1.473 m)   Wt 138 lb (62.6 kg)   SpO2 97%   BMI 28.84 kg/m   70 y.o.female presents for review of glucose log due to uncontrolled T2DM on insulin with recent dose adjustment.   She has  had diabetes for 20 years. At [place] 10/14/2020 she was reporting urinary frequency; BGL that day was 542 and she was advised to increase insulin from 20 to 25 units, then up to 30 units if needed for fasting goal <130. She was advised to keep a close log and presents for follow up. She admits forgot glucose log. She reports this AM was 133, reports over the last week fasting has ranged 140-low 200s, is improved from previous (250-300+). She also has been checking prior to supper, reports 146 yesterday.   She has been working on diet, exercise limited, for diabetes, and denies hypoglycemia, nausea, paresthesia of the feet, polydipsia, visual disturbances, vomiting and weight loss. She does report ongoing polyuria, going 9-10 times day for last several months.  Hx of intolerance with metformin, actos, januvia, onglyza.  She is prescribed glimepiride 4 mg BID, lantus 23 units PM.    Lab Results  Component Value Date   HGBA1C 11.4 (H) 10/14/2020    Lab Results  Component Value Date   GFRNONAA 57 (L) 10/14/2020   GFRNONAA 74 06/24/2020   GFRNONAA 90 03/12/2020   Lab Results  Component Value Date   CREATININE 1.00 (H) 10/14/2020   CREATININE 0.81 06/24/2020   CREATININE 0.67 03/12/2020     Past Medical History:  Diagnosis Date  . Anxiety   . Anxiety disorder   . Cirrhosis ([place])   . COPD (chronic obstructive pulmonary disease) ([place])    followed by pcp--  last exacerbation 11/ 2017  . Coronary artery  disease, non-occlusive 03/2018   Cardiac Cath: Moderate (60%) proximal-mid RCA (FFR 0.89 => NEGATIVE / not physiologically significant).  Normal EF and EDP.  . Depression    Patient denies  . Gastritis   . History of rib fracture    2004  . 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 Hyperlipidemia    . Hypertension   . OA (osteoarthritis)    back, hips, knees, feet  . Osteoporosis   . Polyp of colon, hyperplastic   . Pulmonary nodules    per CT chest 08-28-2016  -- bilateral upper lobe nodules  . S/P right hip fracture 09/06/2019  . Stroke ([place])   . Tubular adenoma of colon   . Type 2 diabetes mellitus ([place])    followed by pcp--  last A1c 9.1 on 08-01-2016  . UTI (urinary tract infection)   . Wears dentures      Allergies  Allergen Reactions  . Actos [Pioglitazone] Other (See Comments)    Patient stated that it elevated her BGL  . Glyburide Other (See Comments)    Patient stated that it elevated her BGL  . Metformin And Related     "metformin caused mini-strokes" and ELEVATED BS and also GLYBURIDE, ONGLYZA, ACTOS, INVOKANA caused elevated BS  . Onglyza [Saxagliptin] Other (See Comments)    Patient stated that it elevated her BGL  . Insulins Rash  . Janumet [Sitagliptin-Metformin Hcl] Rash and Other (See Comments)    Shakes/ rash  . Penicillins Rash and Other (See Comments)    Current Outpatient Medications on File Prior to Visit  Medication Sig  . acetaminophen (TYLENOL) 500 MG tablet Take 500 mg by mouth as directed. Take 2 tablets three times ad ay as needed  . albuterol (PROVENTIL) (2.5 MG/3ML) 0.083% nebulizer solution Take 3 mLs (2.5 mg total) by nebulization every 6 (six) hours as needed for wheezing or shortness of breath.  . ALPRAZolam (XANAX) 1 MG tablet Take 1/2 - 1 tablet two to three times daily ONLY if needed for Anxiety Attack & limit to 5 days/week to avoid Addiction & Dementia  . aspirin EC 81 MG tablet Take 81 mg by mouth daily.  . DULoxetine (CYMBALTA) 20 MG capsule Take 1 capsule (20 mg total) by mouth daily.  . furosemide (LASIX) 40 MG tablet Take 1/2 to 1 tablet Daily if needed ofr Fluid Retention / leg/ankle swelling  . gabapentin (NEURONTIN) 100 MG capsule Take 1 capsule (100 mg total) by mouth 3 (three) times daily.  Marland Kitchen glimepiride (AMARYL) 4 MG  tablet Take1 tablet 2 x /day with Meals for Diabetes  . insulin glargine (LANTUS) 100 UNIT/ML Solostar Pen Inject 20-30 Units into the skin daily.  . Insulin Pen Needle (CAREFINE PEN NEEDLES) 32G X 4 MM MISC Use 1x a day  . lisinopril (ZESTRIL) 5 MG tablet Take 1/2 tablet at night for BP  . nitroGLYCERIN (NITROSTAT) 0.4 MG SL tablet Place 1 tablet (0.4 mg total) under the tongue every 5 (five) minutes as needed for chest pain.  Marland Kitchen propranolol ER (INDERAL LA) 60 MG 24 hr capsule Take 1 capsule (60 mg total) by mouth as needed.  . rosuvastatin (CRESTOR) 20 MG tablet Take 1 tablet 3 x /week on Mon Wed &  Fri   No current facility-administered medications on file prior to visit.    ROS: all negative except above.   Physical Exam:  BP 124/68   Pulse 76   Temp (!) 96.6 F (35.9 C)   Ht 4' 10"  (1.473 m)   Wt 138 lb (62.6 kg)   SpO2 97%   BMI 28.84 kg/m   General appearance: alert, no distress, WD/WN, female HEENT: normocephalic, sclerae anicteric, TMs pearly, nares patent, no discharge or erythema, pharynx normal Oral cavity: MMM, no lesions Neck: supple, no lymphadenopathy, no thyromegaly, no masses Heart: RRR, normal S1, S2, murmurs Lungs: CTA bilaterally, no wheezes, rhonchi, or rales Abdomen: +bs, soft, non tender, non distended, no masses, no hepatomegaly, no splenomegaly Musculoskeletal: no swelling, no obvious deformity, tender to light palpation to left lumbar. Extremities: no edema, no cyanosis Pulses: 2+ symmetric, upper and lower extremities, normal cap refill Neurological: alert, oriented x 3, CN2-12 intact, strength normal upper extremities and lower extremities, sensation normal throughout, DTRs 2+ throughout, gait slow steady with rolling walker Psychiatric: normal affect, behavior normal, pleasant      Izora Ribas, NP 11:39 AM [facility]

## 2020-10-28 ENCOUNTER — Ambulatory Visit (INDEPENDENT_AMBULATORY_CARE_PROVIDER_SITE_OTHER): Payer: PPO | Admitting: Adult Health

## 2020-10-28 ENCOUNTER — Other Ambulatory Visit: Payer: Self-pay

## 2020-10-28 ENCOUNTER — Encounter: Payer: Self-pay | Admitting: Adult Health

## 2020-10-28 VITALS — BP 124/68 | HR 76 | Temp 96.6°F | Ht <= 58 in | Wt 138.0 lb

## 2020-10-28 DIAGNOSIS — E1165 Type 2 diabetes mellitus with hyperglycemia: Secondary | ICD-10-CM | POA: Diagnosis not present

## 2020-10-28 DIAGNOSIS — Z79899 Other long term (current) drug therapy: Secondary | ICD-10-CM

## 2020-10-28 DIAGNOSIS — Z794 Long term (current) use of insulin: Secondary | ICD-10-CM

## 2020-10-28 NOTE — Patient Instructions (Signed)
  Continue monitoring fasting sugar every morning -   If persistently 130+, increase insulin by another 1 unit  Can do this every 3 days until fasting sugar is 100-150 with RARE outliers  Back off on insulin if any LOW sugars (<80 with weakness, blurry vision, shakes)        Bad carbs also include fruit juice, alcohol, and sweet tea. These are empty calories that do not signal to your brain that you are full.   Please remember the good carbs are still carbs which convert into sugar. So please measure them out no more than 1/2-1 cup of rice, oatmeal, pasta, and beans  Veggies are however free foods! Pile them on.   Not all fruit is created equal. Please see the list below, the fruit at the bottom is higher in sugars than the fruit at the top. Please avoid all dried fruits.

## 2020-10-29 ENCOUNTER — Other Ambulatory Visit: Payer: Self-pay | Admitting: Adult Health

## 2020-10-29 DIAGNOSIS — E871 Hypo-osmolality and hyponatremia: Secondary | ICD-10-CM

## 2020-10-29 DIAGNOSIS — I10 Essential (primary) hypertension: Secondary | ICD-10-CM

## 2020-10-30 LAB — BASIC METABOLIC PANEL WITH GFR
BUN: 15 mg/dL (ref 7–25)
CO2: 28 mmol/L (ref 20–32)
Calcium: 9.5 mg/dL (ref 8.6–10.4)
Chloride: 90 mmol/L — ABNORMAL LOW (ref 98–110)
Creat: 0.7 mg/dL (ref 0.50–0.99)
GFR, Est African American: 102 mL/min/{1.73_m2} (ref >=60)
GFR, Est Non African American: 88 mL/min/{1.73_m2} (ref >=60)
Glucose, Bld: 251 mg/dL — ABNORMAL HIGH (ref 65–99)
Potassium: 4.7 mmol/L (ref 3.5–5.3)
Sodium: 126 mmol/L — ABNORMAL LOW (ref 135–146)

## 2020-10-30 LAB — FRUCTOSAMINE: Fructosamine: 363 umol/L — ABNORMAL HIGH (ref 205–285)

## 2020-11-04 ENCOUNTER — Other Ambulatory Visit: Payer: Self-pay | Admitting: Adult Health Nurse Practitioner

## 2020-11-11 ENCOUNTER — Ambulatory Visit (INDEPENDENT_AMBULATORY_CARE_PROVIDER_SITE_OTHER): Payer: PPO | Admitting: *Deleted

## 2020-11-11 ENCOUNTER — Other Ambulatory Visit: Payer: Self-pay

## 2020-11-11 DIAGNOSIS — E871 Hypo-osmolality and hyponatremia: Secondary | ICD-10-CM | POA: Diagnosis not present

## 2020-11-11 NOTE — Progress Notes (Signed)
Patient is here for a BMET to recheck low sodium. Patient has increased her sodium intake and held her Lasix. Patient continued to take Lisinopril 5 mg 1 tablet daily, but did not increase to 2 tablets for a BP greater than 140/80. Patient brought BP reading that ranged from 91/60 to 161/86 this morning. Patient has bilateral edema of legs, the right being worse than the left. She states she did take Lasix 40 mg yesterday, due to the edema and pain in her legs. Per Dr Melford Aase, patient can restart Lasix due to the edema but advised this may change pending lab results. Instructions written for the patient regarding Lisinopril dosing. Patient scheduled an OV in 2 weeks with Dr Melford Aase.

## 2020-11-12 LAB — BASIC METABOLIC PANEL WITH GFR
BUN: 13 mg/dL (ref 7–25)
CO2: 30 mmol/L (ref 20–32)
Calcium: 9.3 mg/dL (ref 8.6–10.4)
Chloride: 94 mmol/L — ABNORMAL LOW (ref 98–110)
Creat: 0.74 mg/dL (ref 0.50–0.99)
GFR, Est African American: 96 mL/min/{1.73_m2} (ref >=60)
GFR, Est Non African American: 83 mL/min/{1.73_m2} (ref >=60)
Glucose, Bld: 398 mg/dL — ABNORMAL HIGH (ref 65–99)
Potassium: 5 mmol/L (ref 3.5–5.3)
Sodium: 131 mmol/L — ABNORMAL LOW (ref 135–146)

## 2020-12-03 ENCOUNTER — Ambulatory Visit: Payer: PPO | Admitting: Internal Medicine

## 2020-12-03 ENCOUNTER — Ambulatory Visit (INDEPENDENT_AMBULATORY_CARE_PROVIDER_SITE_OTHER): Payer: PPO | Admitting: Adult Health

## 2020-12-03 ENCOUNTER — Other Ambulatory Visit: Payer: Self-pay

## 2020-12-03 ENCOUNTER — Encounter: Payer: Self-pay | Admitting: Adult Health

## 2020-12-03 VITALS — BP 130/78 | HR 83 | Temp 97.6°F | Wt 138.0 lb

## 2020-12-03 DIAGNOSIS — E1151 Type 2 diabetes mellitus with diabetic peripheral angiopathy without gangrene: Secondary | ICD-10-CM | POA: Diagnosis not present

## 2020-12-03 DIAGNOSIS — Z79899 Other long term (current) drug therapy: Secondary | ICD-10-CM

## 2020-12-03 DIAGNOSIS — E871 Hypo-osmolality and hyponatremia: Secondary | ICD-10-CM

## 2020-12-03 DIAGNOSIS — I700 Atherosclerosis of aorta: Secondary | ICD-10-CM

## 2020-12-03 MED ORDER — ALPRAZOLAM 1 MG PO TABS: ORAL_TABLET | ORAL | 0 refills | Status: DC

## 2020-12-03 MED ORDER — GLIMEPIRIDE 4 MG PO TABS: ORAL_TABLET | ORAL | 0 refills | Status: DC

## 2020-12-03 MED ORDER — INSULIN GLARGINE 100 UNIT/ML SOLOSTAR PEN
20.0000 [IU] | PEN_INJECTOR | Freq: Every day | SUBCUTANEOUS | 0 refills | Status: DC
Start: 2020-12-03 — End: 2021-03-11

## 2020-12-03 MED ORDER — PROPRANOLOL HCL ER 60 MG PO CP24: 60.0000 mg | ORAL_CAPSULE | ORAL | 0 refills | Status: DC | PRN

## 2020-12-03 NOTE — Progress Notes (Signed)
   Patient was seen by Caryl Pina today

## 2020-12-03 NOTE — Progress Notes (Signed)
Assessment and Plan:  Sara Lamb was seen today for follow-up.  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  -     glimepiride (AMARYL) 4 MG tablet; Take1 tablet 2 x /day with Meals for Diabetes -     BASIC METABOLIC PANEL WITH GFR -     Fructosamine  Hyponatremia/Edema Back on lasix due to edema per Dr. Melford Aase - elevate legs TID, increase activity,  Wear compression socks more routinely if available. Return to the office if no change with symptoms. Check labs. -     BASIC METABOLIC PANEL WITH GFR  Other orders -     insulin glargine (LANTUS) 100 UNIT/ML Solostar Pen; Inject 20-30 Units into the skin daily. -     propranolol ER (INDERAL LA) 60 MG 24 hr capsule; Take 1 capsule (60 mg total) by mouth as needed. -     ALPRAZolam (XANAX) 1 MG tablet; Take 1/2 - 1 tablet two to three times daily ONLY if needed for Anxiety Attack & limit to 5 days/week to avoid Addiction & Dementia.   Further disposition pending results of labs. Discussed med's effects and SE's.   Over 30 minutes of exam, counseling, chart review, and critical decision making was performed.   Future Appointments  Date Time Provider [place]  12/03/2020  4:00 PM Unk Pinto, MD [facility] None  03/17/2021  2:00 PM Liane Comber, NP [facility] None  06/25/2021  3:00 PM Liane Comber, NP [facility] None    ------------------------------------------------------------------------------------------------------------------   HPI BP 130/78    Pulse 83   Temp 97.6 F (36.4 C)   Wt 138 lb (62.6 kg)   SpO2 98%   BMI 28.84 kg/m   70 y.o.female presents for follow up on glucose, kidney functions, edema and hyponatremia  BMI is Body mass index is 28.84 kg/m., she has been working on diet, limited exercise due to chronic ortho pain and uses walker. Has cut down on sugars and "no starch," doing grilled chicken/pork/fish and veggies.  Wt Readings from Last 3 Encounters:  12/03/20 138 lb (62.6 kg)  11/11/20 140 lb (63.5 kg)  10/28/20 138 lb (62.6 kg)   Her blood pressure has been controlled at home (rabile ranges 116/69-154/86 per home log in last few weeks), today their BP is BP: 130/78  She does not workout. She denies chest pain, shortness of breath, dizziness.   She has been working on diet and exercise for T2DM, hx of metformin intolerance, recently on lantus 23 units PM, glimepiride 4 mg BID and denies foot ulcerations, hypoglycemia , increased appetite, nausea, polydipsia, polyuria, visual disturbances, vomiting and weight loss.  Home glucose log shows fasting ranging 145-284, labile, is much improved from previous 300-400+ Last A1C in the office was:  Lab Results  Component Value Date   HGBA1C 11.4 (H) 10/14/2020   She had renal function decline, also chronic hyponatremia, was off of lasix but was restarted last visit per Dr. Melford Aase recommendation for bil ankle edema, takes 40 mg daily PRN, does take most days except  if leaving the home, skipped today. No hx of CHF but is smoker with CAD.  Lab Results  Component Value Date   GFRNONAA 83 11/11/2020   GFRNONAA 88 10/28/2020   GFRNONAA 57 (L) 10/14/2020   Lab Results  Component Value Date   NA 131 (L) 11/11/2020   K 5.0 11/11/2020   CL 94 (L) 11/11/2020   CO2 30 11/11/2020   GLUCOSE 398 (H) 11/11/2020   BUN 13 11/11/2020   CREATININE 0.74 11/11/2020   CALCIUM 9.3 11/11/2020   GFRAA 96 11/11/2020   GFRNONAA 83 11/11/2020     Past Medical History:  Diagnosis  Date  . Anxiety   . Anxiety disorder   . Cirrhosis ([place])   . COPD (chronic obstructive pulmonary disease) ([place])    followed by pcp--  last exacerbation 11/ 2017  . Coronary artery disease, non-occlusive 03/2018   Cardiac Cath: Moderate (60%) proximal-mid RCA (FFR 0.89 => NEGATIVE / not physiologically significant).  Normal EF and EDP.  . Depression    Patient denies  . Gastritis   . History of rib fracture    2004  . 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 Hyperlipidemia   . Hypertension   . OA (osteoarthritis)    back, hips, knees, feet  . Osteoporosis   . Polyp of colon, hyperplastic   . Pulmonary nodules    per CT chest 08-28-2016  -- bilateral upper lobe nodules  . S/P right hip fracture 09/06/2019  . Stroke ([place])   . Tubular adenoma of colon   . Type 2 diabetes mellitus ([place])    followed by pcp--  last A1c 9.1 on 08-01-2016  . UTI (urinary tract infection)   . Wears dentures      Allergies  Allergen Reactions  . Actos [Pioglitazone] Other (See Comments)    Patient stated that it elevated her BGL  . Glyburide Other (See Comments)    Patient stated that it elevated her BGL  . Metformin And Related     "metformin caused mini-strokes" and ELEVATED BS and also GLYBURIDE, ONGLYZA, ACTOS, INVOKANA caused elevated BS  . Onglyza [Saxagliptin] Other (See Comments)    Patient stated that it elevated her BGL  . Insulins Rash  . Janumet [Sitagliptin-Metformin Hcl] Rash and Other (See Comments)    Shakes/ rash  . Penicillins Rash and Other (See Comments)    Current Outpatient Medications on File Prior to Visit  Medication Sig  . acetaminophen (TYLENOL) 500 MG tablet Take 500 mg by mouth as directed. Take 2 tablets three times ad ay as needed  . albuterol (PROVENTIL) (2.5 MG/3ML)  0.083% nebulizer solution Take 3 mLs (2.5 mg total) by nebulization every 6 (six) hours as needed for wheezing or shortness of breath.  . ALPRAZolam (XANAX) 1 MG tablet Take 1/2 - 1 tablet two to three times daily ONLY if needed for Anxiety Attack & limit to 5 days/week to avoid Addiction & Dementia  . aspirin EC 81 MG tablet Take 81 mg by mouth daily.  . DULoxetine (CYMBALTA) 20 MG capsule Take 1 capsule (20 mg total) by mouth daily.  . furosemide (LASIX) 40 MG tablet Take 1/2 to 1 tablet Daily if needed ofr Fluid Retention / leg/ankle swelling  .  gabapentin (NEURONTIN) 100 MG capsule Take 1 capsule (100 mg total) by mouth 3 (three) times daily.  Marland Kitchen glimepiride (AMARYL) 4 MG tablet Take1 tablet 2 x /day with Meals for Diabetes  . insulin glargine (LANTUS) 100 UNIT/ML Solostar Pen Inject 20-30 Units into the skin daily.  . Insulin Pen Needle (CAREFINE PEN NEEDLES) 32G X 4 MM MISC Use 1x a day  . lisinopril (ZESTRIL) 5 MG tablet Take 1/2 tablet at night for BP  . nitroGLYCERIN (NITROSTAT) 0.4 MG SL tablet Place 1 tablet (0.4 mg total) under the tongue every 5 (five) minutes as needed for chest pain.  Marland Kitchen propranolol ER (INDERAL LA) 60 MG 24 hr capsule Take 1 capsule (60 mg total) by mouth as needed.  . rosuvastatin (CRESTOR) 20 MG tablet Take 1 tablet 3 x /week on Mon Wed & Fri   No current facility-administered medications on file prior to visit.   Allergies:  Allergies  Allergen Reactions  . Actos [Pioglitazone] Other (See Comments)    Patient stated that it elevated her BGL  . Glyburide Other (See Comments)    Patient stated that it elevated her BGL  . Metformin And Related     "metformin caused mini-strokes" and ELEVATED BS and also GLYBURIDE, ONGLYZA, ACTOS, INVOKANA caused elevated BS  . Onglyza [Saxagliptin] Other (See Comments)    Patient stated that it elevated her BGL  . Insulins Rash  . Janumet [Sitagliptin-Metformin Hcl] Rash and Other (See Comments)    Shakes/ rash  .  Penicillins Rash and Other (See Comments)   ROS: all negative except above.   Physical Exam:  BP 130/78   Pulse 83   Temp 97.6 F (36.4 C)   Wt 138 lb (62.6 kg)   SpO2 98%   BMI 28.84 kg/m   General appearance: alert, no distress, WD/WN, female HEENT: normocephalic, sclerae anicteric, TMs pearly, nares patent, no discharge or erythema, pharynx normal Oral cavity: MMM, no lesions Neck: supple, no lymphadenopathy, no thyromegaly, no masses Heart: RRR, normal S1, S2, murmurs Lungs: CTA bilaterally, no wheezes, rhonchi, or rales Abdomen: +bs, soft, non tender, non distended, no masses, no hepatomegaly, no splenomegaly Musculoskeletal: no swelling, no obvious deformity,  Extremities: Minimal non-pitting edema to bil ankles, no cyanosis Pulses: 2+ symmetric, upper and lower extremities, normal cap refill Neurological: alert, oriented x 3, CN2-12 intact, strength normal upper extremities and lower extremities, sensation normal throughout, DTRs 2+ throughout, gait slow steady with rolling walker Psychiatric: normal affect, behavior normal, pleasant      Izora Ribas, NP 2:57 PM [facility]

## 2020-12-05 LAB — BASIC METABOLIC PANEL WITH GFR
BUN: 22 mg/dL (ref 7–25)
CO2: 29 mmol/L (ref 20–32)
Calcium: 9.7 mg/dL (ref 8.6–10.4)
Chloride: 95 mmol/L — ABNORMAL LOW (ref 98–110)
Creat: 0.87 mg/dL (ref 0.50–0.99)
GFR, Est African American: 79 mL/min/{1.73_m2} (ref >=60)
GFR, Est Non African American: 68 mL/min/{1.73_m2} (ref >=60)
Glucose, Bld: 216 mg/dL — ABNORMAL HIGH (ref 65–99)
Potassium: 4.7 mmol/L (ref 3.5–5.3)
Sodium: 131 mmol/L — ABNORMAL LOW (ref 135–146)

## 2020-12-05 LAB — FRUCTOSAMINE: Fructosamine: 373 umol/L — ABNORMAL HIGH (ref 205–285)

## 2021-01-14 ENCOUNTER — Other Ambulatory Visit: Payer: Self-pay | Admitting: Adult Health

## 2021-01-14 ENCOUNTER — Other Ambulatory Visit: Payer: Self-pay

## 2021-01-14 MED ORDER — GABAPENTIN 100 MG PO CAPS: 100.0000 mg | ORAL_CAPSULE | Freq: Three times a day (TID) | ORAL | 1 refills | Status: DC

## 2021-01-15 ENCOUNTER — Telehealth: Payer: Self-pay

## 2021-01-15 ENCOUNTER — Other Ambulatory Visit: Payer: Self-pay | Admitting: Adult Health

## 2021-01-15 NOTE — Telephone Encounter (Signed)
Xanax refill request.

## 2021-02-11 DIAGNOSIS — N182 Chronic kidney disease, stage 2 (mild): Secondary | ICD-10-CM | POA: Insufficient documentation

## 2021-02-11 NOTE — Progress Notes (Signed)
Assessment and Plan:  Sara Lamb was seen today for follow-up.  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  -     glimepiride (AMARYL) 4 MG tablet; Take1 tablet 2 x /day with Meals for Diabetes -     BASIC METABOLIC PANEL WITH GFR -     Fructosamine  Hyponatremia/Edema Back on lasix due to edema per Dr. Melford Aase - elevate legs TID, increase activity,  Wear compression socks more routinely if available. Return to the office if getting worse or any new fatigue/dyspnea. Check labs. -     BASIC METABOLIC PANEL WITH GFR  Rash/other non-specific skin eruption Chronic per patient, indeterminate etiology Has tried elimination of products, notes improves but doesn't resolve with abx She questions med reaction-  Will try extended course of doxycycline due to appearance of mild infection of right shoulder lesion but will proceed with derm referral for their input/formal diagnosis -     doxycycline (VIBRAMYCIN) 100 MG capsule; Take 1 capsule 2 x/day with food for 2 weeks.  Other orders -     ALPRAZolam (XANAX) 1 MG tablet; Take 1/2 - 1 tablet by mouth two to three times daily ONLY if needed for Anxiety Attack & limit to 5 days/week to avoid Addiction & Dementia. -     propranolol (INDERAL) 20 MG tablet; Take 0.5-1 tablets (10-20 mg total) by mouth 2 (two) times daily as needed. For tremors/shakes   Further disposition pending results of labs. Discussed med's effects and SE's.   Over 30  minutes of exam, counseling, chart review, and critical decision making was performed.   Future Appointments  Date Time Provider [place]  03/17/2021  2:00 PM Liane Comber, NP [facility] None  06/25/2021  3:00 PM Liane Comber, NP [facility] None    ------------------------------------------------------------------------------------------------------------------   HPI BP 128/72   Pulse 88   Temp (!) 97 F (36.1 C)   Wt 137 lb (62.1 kg)   SpO2 99%   BMI 28.63 kg/m   70 y.o.female presents for follow up on poorly controlled T2DM, CKD, edema and hyponatremia.   Today noted sores/lesions to bil shoulder, breast, she reports has been ongoing for several years, has tried eliminating all products, no changes, she believes possible allergy to insulin, notes this seems worse since recent dose increae. Has tried topical steroids, seems to imprve when on abx for other conditions but won't resolve, has tried bag balm, neosporin, etc various topicals without significant benefit.   BMI is Body mass index is 28.63 kg/m., she has been working on diet, limited exercise due to chronic ortho pain and uses walker. Has cut down on sugars and "no starch," doing grilled chicken/pork/fish and veggies. She does occasionally have cheese burger, pasta, breaded okra which we note correlate with higher glucose the following day on log review.  Wt Readings from Last 3 Encounters:  02/12/21 137 lb (62.1 kg)  12/03/20 138 lb (62.6 kg)  11/11/20 140 lb (63.5 kg)   Her blood  pressure has been controlled at home (rabile ranges 116/69-154/86 per home log in last few weeks), today their BP is BP: 128/72  She does not workout. She denies chest pain, shortness of breath, dizziness.   She has been working on diet and exercise for T2DM, hx of alleged metformin intolerance, recently on lantus 23 units PM, glimepiride 4 mg BID and denies foot ulcerations, increased appetite, nausea, polydipsia, polyuria, visual  disturbances, vomiting, and weight loss.  Home glucose log shows fasting ranging 111- 238, labile, is much improved from previous 300-400+. She does have hypoglycemia awareness with glucose <120. Denies nocturnal events.  Last A1C in the office was:  Lab Results  Component Value Date   HGBA1C 11.4 (H) 10/14/2020   She had renal function decline, also chronic hyponatremia, was off of lasix but was restarted last visit per Dr. Melford Aase recommendation for bil ankle edema, takes 40 mg daily PRN, does take most days except if leaving the home, skipped today. No hx of CHF but is smoker with CAD.  Lab Results  Component Value Date   FIEPPIRJ 18 12/03/2020   GFRNONAA 83 11/11/2020   GFRNONAA 88 10/28/2020   Lab Results  Component Value Date   NA 131 (L) 12/03/2020   K 4.7 12/03/2020   CL 95 (L) 12/03/2020   CO2 29 12/03/2020   GLUCOSE 216 (H) 12/03/2020   BUN 22 12/03/2020   CREATININE 0.87 12/03/2020   CALCIUM 9.7 12/03/2020   GFRAA 79 12/03/2020   GFRNONAA 68 12/03/2020      Past Medical History:  Diagnosis Date   Anxiety    Anxiety disorder    Cirrhosis ([place])    COPD (chronic obstructive pulmonary disease) ([place])    followed by pcp--  last exacerbation 11/ 2017   Coronary artery disease, non-occlusive 03/2018   Cardiac Cath: Moderate (60%) proximal-mid RCA (FFR 0.89 => NEGATIVE / not physiologically significant).  Normal EF and EDP.   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--  "gets real sharp stabbing pain over eye and speech screws up"  pt states takes excederin migraine and lies down (pt stated was told if she "if come to ER again they would send her to psych ward")   Hyperlipidemia    Hypertension    OA (osteoarthritis)    back, hips, knees, feet   Osteoporosis    Polyp of colon, hyperplastic    Pulmonary nodules    per CT chest 08-28-2016  -- bilateral upper lobe nodules   S/P right hip  fracture 09/06/2019   Stroke [facility])    Tubular adenoma of colon    Type 2 diabetes mellitus ([place])    followed by pcp--  last A1c 9.1 on 08-01-2016   UTI (urinary tract infection)    Wears dentures      Allergies  Allergen Reactions   Actos [Pioglitazone] Other (See Comments)    Patient stated that it elevated her BGL   Glyburide Other (See Comments)    Patient stated that it elevated her BGL   Metformin And Related     "metformin caused mini-strokes" and ELEVATED BS and also GLYBURIDE, ONGLYZA, ACTOS, INVOKANA caused elevated BS   Onglyza [Saxagliptin] Other (See Comments)    Patient stated that it elevated her BGL   Insulins Rash   Janumet [Sitagliptin-Metformin Hcl] Rash and Other (See Comments)  Shakes/ rash   Penicillins Rash and Other (See Comments)    Current Outpatient Medications on File Prior to Visit  Medication Sig   acetaminophen (TYLENOL) 500 MG tablet Take 500 mg by mouth as directed. Take 2 tablets three times ad ay as needed   albuterol (PROVENTIL) (2.5 MG/3ML) 0.083% nebulizer solution Take 3 mLs (2.5 mg total) by nebulization every 6 (six) hours as needed for wheezing or shortness of breath.   aspirin EC 81 MG tablet Take 81 mg by mouth daily.   DULoxetine (CYMBALTA) 20 MG capsule Take 1 capsule (20 mg total) by mouth daily.   furosemide (LASIX) 40 MG tablet Take 1/2 to 1 tablet Daily if needed ofr Fluid Retention / leg/ankle swelling   gabapentin (NEURONTIN) 100 MG capsule Take 1 capsule (100 mg total) by mouth 3 (three) times daily.   insulin glargine (LANTUS) 100 UNIT/ML Solostar Pen Inject 20-30 Units into the skin daily.   Insulin Pen Needle (CAREFINE PEN NEEDLES) 32G X 4 MM MISC Use 1x a day   nitroGLYCERIN (NITROSTAT) 0.4 MG SL tablet Place 1 tablet (0.4 mg total) under the tongue every 5 (five) minutes as needed for chest pain.   rosuvastatin (CRESTOR) 20 MG tablet Take 1 tablet 3 x /week on Mon Wed & Fri   No current facility-administered medications  on file prior to visit.   Allergies:  Allergies  Allergen Reactions   Actos [Pioglitazone] Other (See Comments)    Patient stated that it elevated her BGL   Glyburide Other (See Comments)    Patient stated that it elevated her BGL   Metformin And Related     "metformin caused mini-strokes" and ELEVATED BS and also GLYBURIDE, ONGLYZA, ACTOS, INVOKANA caused elevated BS   Onglyza [Saxagliptin] Other (See Comments)    Patient stated that it elevated her BGL   Insulins Rash   Janumet [Sitagliptin-Metformin Hcl] Rash and Other (See Comments)    Shakes/ rash   Penicillins Rash and Other (See Comments)   ROS: all negative except above.   Physical Exam:  BP 128/72   Pulse 88   Temp (!) 97 F (36.1 C)   Wt 137 lb (62.1 kg)   SpO2 99%   BMI 28.63 kg/m   General appearance: alert, no distress, WD/WN, female HEENT: normocephalic, sclerae anicteric, TMs pearly, nares patent, no discharge or erythema, pharynx normal Oral cavity: MMM, no lesions Neck: supple, no lymphadenopathy, no thyromegaly, no masses Heart: RRR, normal S1, S2, murmurs Lungs: CTA bilaterally, no wheezes, rhonchi, or rales Abdomen: +bs, soft, non tender, non distended,. Musculoskeletal: no swelling, no obvious deformity,  Extremities: Minimal non-pitting edema to bil ankles, no cyanosis Pulses: 2+ symmetric, upper and lower extremities, normal cap refill Neurological: alert, oriented x 3, strength normal upper extremities and lower extremities, gait slow steady with rolling walker Psychiatric: normal affect, behavior normal, pleasant  Skin: open sores ~ 2-3 cm to bil shoulders with ? Excoriation/chronic scarring, single similar to R breast. One lesion to R shoulder with 1cm erythematous borders, no sig discharge or odor.       Izora Ribas, NP 11:51 AM [facility]

## 2021-02-12 ENCOUNTER — Other Ambulatory Visit: Payer: Self-pay

## 2021-02-12 ENCOUNTER — Ambulatory Visit (INDEPENDENT_AMBULATORY_CARE_PROVIDER_SITE_OTHER): Payer: PPO | Admitting: Adult Health

## 2021-02-12 ENCOUNTER — Encounter: Payer: Self-pay | Admitting: Adult Health

## 2021-02-12 VITALS — BP 128/72 | HR 88 | Temp 97.0°F | Wt 137.0 lb

## 2021-02-12 DIAGNOSIS — N182 Chronic kidney disease, stage 2 (mild): Secondary | ICD-10-CM | POA: Diagnosis not present

## 2021-02-12 DIAGNOSIS — E663 Overweight: Secondary | ICD-10-CM | POA: Diagnosis not present

## 2021-02-12 DIAGNOSIS — E1151 Type 2 diabetes mellitus with diabetic peripheral angiopathy without gangrene: Secondary | ICD-10-CM

## 2021-02-12 DIAGNOSIS — Z794 Long term (current) use of insulin: Secondary | ICD-10-CM

## 2021-02-12 DIAGNOSIS — I700 Atherosclerosis of aorta: Secondary | ICD-10-CM | POA: Diagnosis not present

## 2021-02-12 DIAGNOSIS — Z9114 Patient's other noncompliance with medication regimen: Secondary | ICD-10-CM

## 2021-02-12 DIAGNOSIS — E1165 Type 2 diabetes mellitus with hyperglycemia: Secondary | ICD-10-CM

## 2021-02-12 DIAGNOSIS — R21 Rash and other nonspecific skin eruption: Secondary | ICD-10-CM | POA: Diagnosis not present

## 2021-02-12 DIAGNOSIS — E1159 Type 2 diabetes mellitus with other circulatory complications: Secondary | ICD-10-CM | POA: Diagnosis not present

## 2021-02-12 DIAGNOSIS — E1122 Type 2 diabetes mellitus with diabetic chronic kidney disease: Secondary | ICD-10-CM

## 2021-02-12 DIAGNOSIS — I10 Essential (primary) hypertension: Secondary | ICD-10-CM

## 2021-02-12 DIAGNOSIS — Z79899 Other long term (current) drug therapy: Secondary | ICD-10-CM | POA: Diagnosis not present

## 2021-02-12 MED ORDER — DOXYCYCLINE HYCLATE 100 MG PO CAPS: ORAL_CAPSULE | ORAL | 0 refills | Status: DC

## 2021-02-12 MED ORDER — LISINOPRIL 5 MG PO TABS: ORAL_TABLET | ORAL | 0 refills | Status: DC

## 2021-02-12 MED ORDER — PROPRANOLOL HCL 20 MG PO TABS
10.0000 mg | ORAL_TABLET | Freq: Two times a day (BID) | ORAL | 0 refills | Status: DC | PRN
Start: 2021-02-12 — End: 2021-05-13

## 2021-02-12 MED ORDER — ALPRAZOLAM 1 MG PO TABS: ORAL_TABLET | ORAL | 0 refills | Status: DC

## 2021-02-12 MED ORDER — GLIMEPIRIDE 4 MG PO TABS: ORAL_TABLET | ORAL | 0 refills | Status: DC

## 2021-02-12 NOTE — Addendum Note (Signed)
Addended by: Izora Ribas on: 02/12/2021 12:02 PM   Modules accepted: Level of Service

## 2021-02-18 LAB — BASIC METABOLIC PANEL WITH GFR
BUN: 15 mg/dL (ref 7–25)
CO2: 30 mmol/L (ref 20–32)
Calcium: 9.6 mg/dL (ref 8.6–10.4)
Chloride: 96 mmol/L — ABNORMAL LOW (ref 98–110)
Creat: 0.78 mg/dL (ref 0.50–1.05)
Glucose, Bld: 143 mg/dL — ABNORMAL HIGH (ref 65–99)
Potassium: 4.6 mmol/L (ref 3.5–5.3)
Sodium: 130 mmol/L — ABNORMAL LOW (ref 135–146)
eGFR: 82 mL/min/{1.73_m2} (ref >=60)

## 2021-02-18 LAB — FRUCTOSAMINE: Fructosamine: 323 umol/L — ABNORMAL HIGH (ref 205–285)

## 2021-02-20 DIAGNOSIS — M7061 Trochanteric bursitis, right hip: Secondary | ICD-10-CM | POA: Diagnosis not present

## 2021-02-20 DIAGNOSIS — S72001A Fracture of unspecified part of neck of right femur, initial encounter for closed fracture: Secondary | ICD-10-CM | POA: Diagnosis not present

## 2021-02-20 DIAGNOSIS — M25551 Pain in right hip: Secondary | ICD-10-CM | POA: Diagnosis not present

## 2021-02-25 ENCOUNTER — Telehealth: Payer: Self-pay | Admitting: Internal Medicine

## 2021-02-25 NOTE — Chronic Care Management (AMB) (Signed)
  Chronic Care Management   Note  02/25/2021 Name: MONEKA MCQUINN MRN: [id number] DOB: [birth year]  KIDADA GING is a 70 y.o. year old female who is a primary care patient of Unk Pinto, MD. I reached out to [name] by phone today in response to a referral sent by Ms. Conrad Oreland Rojek's PCP, Unk Pinto, MD.   Ms. Dollinger was given information about Chronic Care Management services today including:  CCM service includes personalized support from designated clinical staff supervised by her physician, including individualized plan of care and coordination with other care providers 24/7 contact phone numbers for assistance for urgent and routine care needs. Service will only be billed when office clinical staff spend 20 minutes or more in a month to coordinate care. Only one practitioner may furnish and bill the service in a calendar month. The patient may stop CCM services at any time (effective at the end of the month) by phone call to the office staff.   Patient agreed to services and verbal consent obtained.   Follow up plan:   [place] Secretary/administrator

## 2021-03-11 ENCOUNTER — Other Ambulatory Visit: Payer: Self-pay | Admitting: Adult Health

## 2021-03-13 ENCOUNTER — Encounter: Payer: PPO | Admitting: Adult Health Nurse Practitioner

## 2021-03-14 DIAGNOSIS — Z9114 Patient's other noncompliance with medication regimen: Secondary | ICD-10-CM

## 2021-03-14 NOTE — Progress Notes (Signed)
CPE  Assessment:   [place] was seen today for CPE  Diagnoses and all orders for this visit:  Encounter for Annual Physical Exam with abnormal findings Due annually  Health Maintenance reviewed Healthy lifestyle reviewed and goals set  Declines all vaccines, mammogram, colon cancer screening, DEXA follow up despite discussion of risks. She is requesting minimal interventions. She is agreeable for low dose CT scan for lung cancer screening. Will order this at [facility] as not due.   Atherosclerosis of abdominal aorta ([place]) - per CT 12/2016 Control blood pressure, cholesterol, glucose, increase exercise.  STOP SMOKING  CAD STOP SMOKING Continue medications, denies angina Follows with Cardiology, Dr Ellyn Hack.  Control blood pressure, cholesterol, glucose, increase exercise.   Essential hypertension Monitor blood pressure at home; call if consistently over 130/80 Continue DASH diet.   Reminder to go to the ER if any CP, SOB, nausea, dizziness, severe HA, changes vision/speech, left arm numbness and tingling and jaw pain.  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 Schedule diabetes eye exam  - plan Foot exam completed -     Hemoglobin A1c -     Lipid panel  Liver cirrhosis secondary to NASH (HCC) Declines GI follow up at this time; working on weight loss, low processed carbohydrate diet for NASH management -     COMPLETE METABOLIC PANEL WITH GFR  Chronic obstructive pulmonary disease, unspecified COPD type ([place]) Denies sx; STOP smoking; denies sx; monitor   Hyperlipidemia associated with type 2 diabetes mellitus ([place]) Titrate as tolerated for LDL goal <70 Several agents with reported SE/intolerances, has not tried other statins - low dose pravastatin discussed, she is receptive, sent in to try;  Discussed dietary  and exercise modifications. -     Lipid panel  Osteoporosis, unspecified osteoporosis type, unspecified pathological fracture presence Due for DEXA scan -patient declines at this time  Discussed Calcium and Vit D supplementation Weight bearing exercise encouraged STOP SMOKING  Tobacco use disorder Discussed smoking cessation.  Reports she is smoking 2 packs a day.  Has been using gum to help.  Reports she has tried chantix, not ready to quit. Encouraged to taper down.  Will continue to assess readiness. -continue low dose annual CT screening   Chronic hyponatremia Monitor;  -     COMPLETE METABOLIC PANEL WITH GFR  Anxiety Improved with Cymbalta; reviewed goal to avoid daily use of xanax, continue to taper, declines alternative med option for sleep today Close follow up 3 months Stress management techniques discussed, increase water, good sleep hygiene discussed, increase exercise, and increase veggies.  -     ALPRAZolam (XANAX) 1 MG tablet; Take 1/2 to one tablet by mouth two or three times a day as needed for anxiety. Limit to five days per week.  Vitamin D deficiency -     VITAMIN D 25 Hydroxy (Vit-D Deficiency, Fractures)  Poor compliance with medication Discussed this at length with patient.  Denies current barriers  Orders Placed This Encounter  Procedures   CBC with Differential/Platelet   COMPLETE METABOLIC PANEL WITH GFR   Magnesium   Lipid panel   TSH   Hemoglobin A1c   VITAMIN D 25 Hydroxy (Vit-D Deficiency, Fractures)   Microalbumin / creatinine urine ratio   Urinalysis, Routine w reflex microscopic   HM DIABETES FOOT EXAM    Over 40 minutes of exam,  counseling, chart review and critical decision making was performed Future Appointments  Date Time Provider [place]  05/13/2021  3:00 PM [name], [facility] None  06/25/2021  2:30 PM Liane Comber, NP [facility] None  03/18/2022  2:00 PM Liane Comber, NP [facility]AM[facility] None     Plan:    During the course of the visit the patient was educated and counseled about appropriate screening and preventive services including:   Pneumococcal vaccine  Prevnar 13 Influenza vaccine Td vaccine Screening electrocardiogram Bone densitometry screening Colorectal cancer screening Diabetes screening Glaucoma screening Nutrition counseling  Advanced directives: requested   Subjective:  Sara Lamb is a 70 y.o. MWF female who presents for CPE. She has Vitamin D deficiency; Anxiety; Essential hypertension; Hyperlipidemia associated with type 2 diabetes mellitus ([place]); Poor compliance with medication; Tobacco use disorder; COPD (chronic obstructive pulmonary disease) ([place]); Atherosclerosis of abdominal aorta ([place]) - per CT 12/2016; Osteoporosis; Liver cirrhosis secondary to NASH ([place]); Cognitive dysfunction; Coronary artery disease, non-occlusive: RCA 60%, FFR Negative.; Chronic hyponatremia; Type 2 diabetes mellitus with circulatory disorder ([place]); Chest pain with low risk for cardiac etiology; Overweight (BMI 25.0-29.9); CKD stage 2 due to type 2 diabetes mellitus ([place]); and Rash and other nonspecific skin eruption on their problem list.  She is married, 1 son. She declines most cancer screenings, would decline treatment. Also declines vaccines. Has hx of numerous intolerances with meds.   She is s/p fall in Jan 2021 and fracture to her hip, continues to use walker, 1 step at home, can manage with handrail, completed PT. Has handicap shower installed with rail. She is on gabapentin 19m that is helping. Denies falls since.  [facility], also takes flexeril and voltaren.   She was recently started on cymbalta for mood and chronic pain and has noted some improvement. Xanax 1 pill every night due to sleep, reports has been able to taper down on day time use, previously 1/2 tab twice during the day, now reports 0-1/2 tab. She reports trazodone  made her more irritated/kept her awake.   She has cirrhosis due to NASH, has seen GI Dr. AHavery Moros EGD 01/2017, no varices. Denies alcohol intake.   Continues to smoke, reports has reduced from 2-2.5 to 1.5 but back up to 2 packs/day wit stress. Last CT chest 07/23/2020 showed small nodules, aortic atherosclerosis, emphysematous changes. denies dyspnea or secretions,  BMI is Body mass index is 28.76 kg/m., she has been working on diet, keeping a close log, admits minimal exercise due to pain. She admits no water, drinks mostlyy coke zero with lots of ice.  Wt Readings from Last 3 Encounters:  03/17/21 137 lb 9.6 oz (62.4 kg)  02/12/21 137 lb (62.1 kg)  12/03/20 138 lb (62.6 kg)   Had cath 2019 showed Prox RCA to Mid RCA lesion is 60% stenosed. FFR negative- following with Dr. HEllyn Hack Perfusion study 06/2020 was low risk.   Her blood pressure has been controlled at home, today their BP is BP: 130/74 She does not workout. She denies shortness of breath, dizziness.   She has aortic atherosclerosis per CT 12/2016 and several since.  She is not on cholesterol medication alleging several intolerances (most recently rosuvastatin (diarrhea) and denies myalgias. She has failed welchol and colespitaol in the Her cholesterol is not at goal. The cholesterol last visit was:   Lab Results  Component Value Date   CHOL 187 10/14/2020   HDL 39 (L) 10/14/2020  [place]  10/14/2020     Comment:     . LDL cholesterol not calculated. Triglyceride levels greater than 400 mg/dL invalidate calculated LDL results. . Reference range: <100 . Desirable range <100 mg/dL for primary prevention;   <70 mg/dL for patients with CHD or diabetic patients  with > or = 2 CHD risk factors. Marland Kitchen LDL-C is now calculated using the Martin-Hopkins  calculation, which is a validated novel method providing  better accuracy than the Friedewald equation in the  estimation of LDL-C.  Cresenciano Genre et al. Annamaria Helling. 3734;287(68):  2061-2068  (http://education.Quest[facility].com/faq/FAQ164)    TRIG 423 (H) 10/14/2020   CHOLHDL 4.8 10/14/2020   She has had diabetes for 20 years. She has not been working on diet and exercise for diabetes, and denies hyperglycemia, hypoglycemia , nausea, paresthesia of the feet, polydipsia, polyuria, visual disturbances, vomiting and weight loss. She is prescribed glimepiride 4 mg BID, lantus 23 units PM, last was very high but home logs were improving with close food tracking and follow ups, last had fructosamine 02/12/2021 A1C conversion suggsted A1C of 8% range. Today reports fasting range 82-140s, rare up to 170s.  Lab Results  Component Value Date   HGBA1C 11.4 (H) 10/14/2020   Last GFR: Lab Results  Component Value Date   GFRNONAA 68 12/03/2020   Patient is on Vitamin D supplement, taking 1000 units three days a week, GI upset with higher doses.   Lab Results  Component Value Date   VD25OH 12 (L) 03/12/2020       Medication Review: Current Outpatient Medications on File Prior to Visit  Medication Sig Dispense Refill   acetaminophen (TYLENOL) 500 MG tablet Take 500 mg by mouth as directed. Take 2 tablets three times ad ay as needed     aspirin EC 81 MG tablet Take 81 mg by mouth daily.     DULoxetine (CYMBALTA) 20 MG capsule Take 1 capsule (20 mg total) by mouth daily. 90 capsule 1   furosemide (LASIX) 40 MG tablet Take 1/2 to 1 tablet Daily if needed ofr Fluid Retention / leg/ankle swelling 90 tablet 1   gabapentin (NEURONTIN) 100 MG capsule Take 1 capsule (100 mg total) by mouth 3 (three) times daily. 270 capsule 1   glimepiride (AMARYL) 4 MG tablet Take1 tablet 2 x /day with Meals for Diabetes 180 tablet 0   Insulin Pen Needle (CAREFINE PEN NEEDLES) 32G X 4 MM MISC Use 1x a day 100 each 3   LANTUS SOLOSTAR 100 UNIT/ML Solostar Pen Inject 20 to 30 Units into the skin daily. 15 mL 1   lisinopril (ZESTRIL) 5 MG tablet Take 1/2 tablet at night for BP 90 tablet 0    propranolol (INDERAL) 20 MG tablet Take 0.5-1 tablets (10-20 mg total) by mouth 2 (two) times daily as needed. For tremors/shakes 60 tablet 0   No current facility-administered medications on file prior to visit.    Allergies  Allergen Reactions   Actos [Pioglitazone] Other (See Comments)    Patient stated that it elevated her BGL   Glyburide Other (See Comments)    Patient stated that it elevated her BGL   Metformin And Related     "metformin caused mini-strokes" and ELEVATED BS and also GLYBURIDE, ONGLYZA, ACTOS, INVOKANA caused elevated BS   Onglyza [Saxagliptin] Other (See Comments)    Patient stated that it elevated her BGL   Rosuvastatin Diarrhea   Insulins Rash   Janumet [Sitagliptin-Metformin Hcl] Rash and Other (See Comments)  Shakes/ rash   Penicillins Rash and Other (See Comments)    Current Problems (verified) Patient Active Problem List   Diagnosis Date Noted   Rash and other nonspecific skin eruption 02/12/2021   CKD stage 2 due to type 2 diabetes mellitus ([place]) 02/11/2021   Overweight (BMI 25.0-29.9) 10/11/2020   Chest pain with low risk for cardiac etiology 06/24/2020   Type 2 diabetes mellitus with circulatory disorder ([place]) 11/24/2018   Chronic hyponatremia 03/18/2018   Coronary artery disease, non-occlusive: RCA 60%, FFR Negative. 02/16/2018   Cognitive dysfunction 03/27/2017   Liver cirrhosis secondary to NASH ([place]) 11/23/2016   Osteoporosis 05/27/2016   Atherosclerosis of abdominal aorta ([place]) - per CT 12/2016 04/30/2016   COPD (chronic obstructive pulmonary disease) ([place]) 04/30/2015   Tobacco use disorder 04/08/2015   Poor compliance with medication 11/05/2014   Vitamin D deficiency    Anxiety    Essential hypertension    Hyperlipidemia associated with type 2 diabetes mellitus ([place])     Screening Tests Immunization History  Administered Date(s) Administered   Hep A / Hep B 01/07/2017, 02/08/2017, 07/09/2017   Td 06/26/2001   Tdap 04/30/2015    Preventative care: Last colonoscopy: 12/2016, polyps, Dr. Havery Moros, 3 year follow up due , however she adamantly declines, understands risks of progresion to cancer EGD: 01/2017 gastritis, no varices  Last mammogram: 05/25/2016 OVERDUE - declines, would not pursue treatment if positive  Last pap smear/pelvic exam: remote, declines  DEXA: 04/2016 - R fem T -3.2, declines follow up at this time   MRI brain 2016 CT chest 07/23/2020 - order follow up at [facility]  Prior vaccinations: DECLINES ALL VACCINES TD or Tdap: 2016   Names of Other Physician/Practitioners you currently use: 1. [facility] here for primary care 2. none, eye doctor, encouraged to see eye doctor - she states will call Dr. Gilford Rile in [place] for diabetes eye exam  3.  Dentures dentist  Patient Care Team: Unk Pinto, MD as PCP - General (Internal Medicine) Leonie Man, MD as PCP - Cardiology (Cardiology) Kinsinger, Arta Bruce, MD as Consulting Physician (General Surgery) Philemon Kingdom, MD as Consulting Physician (Internal Medicine) Armbruster, Carlota Raspberry, MD as Consulting Physician (Gastroenterology) [name], [facility] as Pharmacist (Pharmacist)  SURGICAL HISTORY She  has a past surgical history that includes Vaginal hysterectomy (age 49); Bilateral salpingoophorectomy (1997); Knee arthroscopy (Right, 11/2014); Cholecystectomy (N/A, 09/30/2016); LEFT HEART CATH AND CORONARY ANGIOGRAPHY (N/A, 04/12/2018); INTRAVASCULAR PRESSURE WIRE/FFR STUDY (N/A, 04/12/2018); transthoracic echocardiogram (02/2018); NM MYOVIEW LTD (07/03/2020); and ORIF hip fracture (Right, 2021). FAMILY HISTORY Her family history includes Breast cancer (age of onset: 59) in her sister; CAD in her maternal aunt, maternal uncle, and sister; CAD (age of onset: 37) in her mother; Cancer in her father; Coronary artery disease (age of onset: 20) in her brother; Diabetes in her brother, father, and mother; Heart  attack in her maternal grandmother; Heart attack (age of onset: 4) in her sister; Hyperlipidemia in her brother, sister, and sister; Hypertension in her brother, father, and sister; Kidney Stones in her son; Stomach cancer in her mother; Stroke in her mother.  SOCIAL HISTORY She  reports that she has been smoking cigarettes. She started smoking about 53 years ago. She has a 106.00 pack-year smoking history. She has never used smokeless tobacco. She reports that she does not drink alcohol and does not use drugs.   Review of Systems  Constitutional:  Negative for malaise/fatigue and weight loss.  HENT:  Negative for hearing loss and tinnitus.   Eyes:  Negative for blurred vision and double vision.  Respiratory:  Negative for cough, sputum production, shortness of breath and wheezing.   Cardiovascular:  Negative for chest pain, palpitations, orthopnea, claudication, leg swelling and PND.  Gastrointestinal:  Negative for abdominal pain, blood in stool, constipation, diarrhea, heartburn, melena, nausea and vomiting.  Genitourinary: Negative.   Musculoskeletal:  Positive for joint pain (chronic hip s/p fracture). Negative for falls and myalgias.  Skin:  Negative for rash.  Neurological:  Negative for dizziness, tingling, sensory change, weakness and headaches.  Endo/Heme/Allergies:  Negative for polydipsia.  Psychiatric/Behavioral: Negative.  Negative for depression, memory loss, substance abuse and suicidal ideas. The patient is not nervous/anxious and does not have insomnia.   All other systems reviewed and are negative.   Objective:     Today's Vitals   03/17/21 1413  BP: 130/74  Pulse: 73  Temp: (!) 97.5 F (36.4 C)  SpO2: 99%  Weight: 137 lb 9.6 oz (62.4 kg)  Height: 4' 10"  (1.473 m)   Body mass index is 28.76 kg/m.  General appearance: alert, no distress, WD/WN, female HEENT: normocephalic, sclerae anicteric, TMs pearly, nares patent, no discharge or erythema, pharynx  normal Oral cavity: MMM, no lesions Neck: supple, no lymphadenopathy, no thyromegaly, no masses Heart: RRR, normal S1, S2, murmurs Lungs: CTA bilaterally, no wheezes, rhonchi, or rales Abdomen: +bs, soft, obese abdomen, non tender, non distended, no palpable masses, no hepatomegaly, no splenomegaly though limited by body habitus Musculoskeletal: no swelling, no obvious deformity, slow antalgic gait with walker.  Extremities: 2+ pitting edema bil lower legs, normal cap refill, no cyanosis Pulses: 2+ symmetric, upper and lower extremities, normal cap refill Neurological: alert, oriented x 3, CN2-12 intact, strength normal upper extremities and lower extremities, sensation normal to monofilament in bil feet,  no cerebellar signs, gait slow antalgic with walker.  Psychiatric: normal affect, behavior normal, pleasant  Breasts: declines GU: defer, no concerns  EKG: defer to cardiology   Izora Ribas, NP   03/17/2021

## 2021-03-14 NOTE — Progress Notes (Signed)
[facility])                                            [place] Team                                        Statin Quality Measure Assessment    03/14/2021  ZONNIQUE NORKUS Jul 10, 1951 [id number]  Provider Liane Comber, NP,   I am a [facility] clinical pharmacist that reviews patients for statin quality initiatives.     Per review of chart and payor information, patient has a diagnosis of diabetes but is not currently filling a statin prescription.  This places patient into the SUPD (Statin Use In Patients with Diabetes) measure for CMS.    Patient last filled rosuvastatin 57m TIW 12/14/2019 per claims history. The prescription on file is now expired.   The 10-year ASCVD risk score (Mikey BussingDC JBrooke Bonito, et al., 2013) is: 34.2%   Values used to calculate the score:     Age: [age]years     Sex: Female     Is Non-Hispanic African American: No     Diabetic: Yes     Tobacco smoker: Yes     Systolic Blood Pressure: 1537mmHg     Is BP treated: Yes     HDL Cholesterol: 39 mg/dL     Total Cholesterol: 187 mg/dL 06/24/2020     Component Value Date/Time   CHOL 187 10/14/2020 1519   TRIG 423 (H) 10/14/2020 1519   HDL 39 (L) 10/14/2020 1519   CHOLHDL 4.8 10/14/2020 1519   VLDL 39 (H) 11/23/2016 1300   LDLCALC  10/14/2020 1519     Comment:     . LDL cholesterol not calculated. Triglyceride levels greater than 400 mg/dL invalidate calculated LDL results. . Reference range: <100 . Desirable range <100 mg/dL for primary prevention;   <70 mg/dL for patients with CHD or diabetic patients  with > or = 2 CHD risk factors. .Marland KitchenLDL-C is now calculated using the Martin-Hopkins  calculation, which is a validated novel method providing  better accuracy than the Friedewald equation in the  estimation of LDL-C.  MCresenciano Genreet al. JAnnamaria Helling 24827;078(67: 2061-2068  (http://education.[facility].com/faq/FAQ164)     Please consider ONE of  the following recommendations:  Initiate high intensity statin Atorvastatin 452monce daily, #90, 3 refills   Rosuvastatin 2092mnce daily, #90, 3 refills    Initiate moderate intensity          statin with reduced frequency if prior          statin intolerance 1x weekly, #13, 3 refills   2x weekly, #26, 3 refills   3x weekly, #39, 3 refills    Code for past statin intolerance or  other exclusions (required annually)  Provider Requirements: Associate code during an office visit or telehealth encounter  Drug Induced Myopathy G72.0   Myopathy, unspecified G72.9   Myositis, unspecified M60.9   Rhabdomyolysis M62J44.92Alcoholic fatty liver K70E10.0Cirrhosis of liver K74.69   Prediabetes R73.03   PCOS E28.2   Toxic liver disease, unspecified K71.9   Adverse effect of antihyperlipidemic and antiarteriosclerotic drugs, initial encounter T46.6X5A    Please let us [place] your decision.    Thank  you!   Reed Breech, [place] [phone]

## 2021-03-17 ENCOUNTER — Other Ambulatory Visit: Payer: Self-pay

## 2021-03-17 ENCOUNTER — Ambulatory Visit (INDEPENDENT_AMBULATORY_CARE_PROVIDER_SITE_OTHER): Payer: PPO | Admitting: Adult Health

## 2021-03-17 ENCOUNTER — Encounter: Payer: Self-pay | Admitting: Adult Health

## 2021-03-17 VITALS — BP 130/74 | HR 73 | Temp 97.5°F | Ht <= 58 in | Wt 137.6 lb

## 2021-03-17 DIAGNOSIS — Z1329 Encounter for screening for other suspected endocrine disorder: Secondary | ICD-10-CM

## 2021-03-17 DIAGNOSIS — R21 Rash and other nonspecific skin eruption: Secondary | ICD-10-CM

## 2021-03-17 DIAGNOSIS — I2510 Atherosclerotic heart disease of native coronary artery without angina pectoris: Secondary | ICD-10-CM

## 2021-03-17 DIAGNOSIS — Z5320 Procedure and treatment not carried out because of patient's decision for unspecified reasons: Secondary | ICD-10-CM

## 2021-03-17 DIAGNOSIS — N182 Chronic kidney disease, stage 2 (mild): Secondary | ICD-10-CM | POA: Diagnosis not present

## 2021-03-17 DIAGNOSIS — E1159 Type 2 diabetes mellitus with other circulatory complications: Secondary | ICD-10-CM

## 2021-03-17 DIAGNOSIS — Z0000 Encounter for general adult medical examination without abnormal findings: Secondary | ICD-10-CM | POA: Diagnosis not present

## 2021-03-17 DIAGNOSIS — F09 Unspecified mental disorder due to known physiological condition: Secondary | ICD-10-CM

## 2021-03-17 DIAGNOSIS — J449 Chronic obstructive pulmonary disease, unspecified: Secondary | ICD-10-CM | POA: Diagnosis not present

## 2021-03-17 DIAGNOSIS — E1122 Type 2 diabetes mellitus with diabetic chronic kidney disease: Secondary | ICD-10-CM | POA: Diagnosis not present

## 2021-03-17 DIAGNOSIS — Z0001 Encounter for general adult medical examination with abnormal findings: Secondary | ICD-10-CM

## 2021-03-17 DIAGNOSIS — E663 Overweight: Secondary | ICD-10-CM

## 2021-03-17 DIAGNOSIS — E559 Vitamin D deficiency, unspecified: Secondary | ICD-10-CM

## 2021-03-17 DIAGNOSIS — E785 Hyperlipidemia, unspecified: Secondary | ICD-10-CM | POA: Diagnosis not present

## 2021-03-17 DIAGNOSIS — Z9114 Patient's other noncompliance with medication regimen: Secondary | ICD-10-CM

## 2021-03-17 DIAGNOSIS — F419 Anxiety disorder, unspecified: Secondary | ICD-10-CM

## 2021-03-17 DIAGNOSIS — Z794 Long term (current) use of insulin: Secondary | ICD-10-CM | POA: Diagnosis not present

## 2021-03-17 DIAGNOSIS — I700 Atherosclerosis of aorta: Secondary | ICD-10-CM

## 2021-03-17 DIAGNOSIS — I10 Essential (primary) hypertension: Secondary | ICD-10-CM

## 2021-03-17 DIAGNOSIS — M810 Age-related osteoporosis without current pathological fracture: Secondary | ICD-10-CM

## 2021-03-17 DIAGNOSIS — Z1389 Encounter for screening for other disorder: Secondary | ICD-10-CM

## 2021-03-17 DIAGNOSIS — K7581 Nonalcoholic steatohepatitis (NASH): Secondary | ICD-10-CM

## 2021-03-17 DIAGNOSIS — E1169 Type 2 diabetes mellitus with other specified complication: Secondary | ICD-10-CM | POA: Diagnosis not present

## 2021-03-17 DIAGNOSIS — F17200 Nicotine dependence, unspecified, uncomplicated: Secondary | ICD-10-CM

## 2021-03-17 DIAGNOSIS — K7460 Unspecified cirrhosis of liver: Secondary | ICD-10-CM | POA: Diagnosis not present

## 2021-03-17 MED ORDER — PRAVASTATIN SODIUM 20 MG PO TABS: ORAL_TABLET | ORAL | 3 refills | Status: DC

## 2021-03-17 MED ORDER — ALPRAZOLAM 1 MG PO TABS
ORAL_TABLET | ORAL | 0 refills | Status: DC
Start: 2021-03-17 — End: 2021-04-22

## 2021-03-17 MED ORDER — DOXYCYCLINE HYCLATE 100 MG PO CAPS: ORAL_CAPSULE | ORAL | 0 refills | Status: DC

## 2021-03-17 NOTE — Patient Instructions (Signed)
    Please schedule a diabetes eye exam with Dr. Gilford Rile prior to next appointment

## 2021-03-18 ENCOUNTER — Other Ambulatory Visit: Payer: Self-pay | Admitting: Adult Health

## 2021-03-18 DIAGNOSIS — I10 Essential (primary) hypertension: Secondary | ICD-10-CM

## 2021-03-18 LAB — URINALYSIS, ROUTINE W REFLEX MICROSCOPIC
Bilirubin Urine: NEGATIVE
Ketones, ur: NEGATIVE
Nitrite: NEGATIVE
Specific Gravity, Urine: 1.013 (ref 1.001–1.035)
WBC, UA: >=60 /HPF — AB (ref 0–5)
pH: 5.5 (ref 5.0–8.0)

## 2021-03-18 LAB — COMPLETE METABOLIC PANEL WITH GFR
AG Ratio: 1.2 (calc) (ref 1.0–2.5)
ALT: 16 U/L (ref 6–29)
AST: 20 U/L (ref 10–35)
Albumin: 3.9 g/dL (ref 3.6–5.1)
Alkaline phosphatase (APISO): 146 U/L (ref 37–153)
BUN: 17 mg/dL (ref 7–25)
CO2: 32 mmol/L (ref 20–32)
Calcium: 9.6 mg/dL (ref 8.6–10.4)
Chloride: 95 mmol/L — ABNORMAL LOW (ref 98–110)
Creat: 0.83 mg/dL (ref 0.50–1.05)
Globulin: 3.2 g/dL (calc) (ref 1.9–3.7)
Glucose, Bld: 89 mg/dL (ref 65–99)
Potassium: 4.3 mmol/L (ref 3.5–5.3)
Sodium: 131 mmol/L — ABNORMAL LOW (ref 135–146)
Total Bilirubin: 0.3 mg/dL (ref 0.2–1.2)
Total Protein: 7.1 g/dL (ref 6.1–8.1)
eGFR: 76 mL/min/{1.73_m2} (ref >=60)

## 2021-03-18 LAB — CBC WITH DIFFERENTIAL/PLATELET
Absolute Monocytes: 795 cells/uL (ref 200–950)
Basophils Absolute: 106 cells/uL (ref 0–200)
Basophils Relative: 1 %
Eosinophils Absolute: 859 cells/uL — ABNORMAL HIGH (ref 15–500)
Eosinophils Relative: 8.1 %
HCT: 41.4 % (ref 35.0–45.0)
Hemoglobin: 13.8 g/dL (ref 11.7–15.5)
Lymphs Abs: 2586 cells/uL (ref 850–3900)
MCH: 30.5 pg (ref 27.0–33.0)
MCHC: 33.3 g/dL (ref 32.0–36.0)
MCV: 91.6 fL (ref 80.0–100.0)
MPV: 10.4 fL (ref 7.5–12.5)
Monocytes Relative: 7.5 %
Neutro Abs: 6254 cells/uL (ref 1500–7800)
Neutrophils Relative %: 59 %
Platelets: 239 10*3/uL (ref 140–400)
RBC: 4.52 10*6/uL (ref 3.80–5.10)
RDW: 12.4 % (ref 11.0–15.0)
Total Lymphocyte: 24.4 %
WBC: 10.6 10*3/uL (ref 3.8–10.8)

## 2021-03-18 LAB — MICROALBUMIN / CREATININE URINE RATIO
Creatinine, Urine: 42 mg/dL (ref 20–275)
Microalb Creat Ratio: 2633 mcg/mg creat — ABNORMAL HIGH (ref <30)
Microalb, Ur: 110.6 mg/dL

## 2021-03-18 LAB — LIPID PANEL
Cholesterol: 207 mg/dL — ABNORMAL HIGH (ref <200)
HDL: 40 mg/dL — ABNORMAL LOW (ref >=50)
LDL Cholesterol (Calc): 127 mg/dL (calc) — ABNORMAL HIGH
Non-HDL Cholesterol (Calc): 167 mg/dL (calc) — ABNORMAL HIGH (ref <130)
Total CHOL/HDL Ratio: 5.2 (calc) — ABNORMAL HIGH (ref <5.0)
Triglycerides: 245 mg/dL — ABNORMAL HIGH (ref <150)

## 2021-03-18 LAB — HEMOGLOBIN A1C
Hgb A1c MFr Bld: 8.7 % of total Hgb — ABNORMAL HIGH (ref <5.7)
Mean Plasma Glucose: 203 mg/dL
eAG (mmol/L): 11.2 mmol/L

## 2021-03-18 LAB — TSH: TSH: 1.36 mIU/L (ref 0.40–4.50)

## 2021-03-18 LAB — MICROSCOPIC MESSAGE

## 2021-03-18 LAB — VITAMIN D 25 HYDROXY (VIT D DEFICIENCY, FRACTURES): Vit D, 25-Hydroxy: 13 ng/mL — ABNORMAL LOW (ref 30–100)

## 2021-03-18 LAB — MAGNESIUM: Magnesium: 2 mg/dL (ref 1.5–2.5)

## 2021-03-18 MED ORDER — LISINOPRIL 5 MG PO TABS: ORAL_TABLET | ORAL | 1 refills | Status: DC

## 2021-04-15 ENCOUNTER — Telehealth: Payer: Self-pay

## 2021-04-15 NOTE — Progress Notes (Signed)
      Name: Sara Lamb  MRN: [id number] DOB: [birth year]   Called and spoke with patient to change her CCM appointment from 10/18 to 10/25. She stated that she wanted to cancel her appt. She states that she had a bad experience with the last pharmacist. I went over the CCM program in detail and she was willing to give the visit a try. She wanted to keep her appointment date for 05/13/21.   Hildred Alamin, Health Concierge [phone]

## 2021-04-22 ENCOUNTER — Other Ambulatory Visit: Payer: Self-pay | Admitting: Adult Health

## 2021-05-13 ENCOUNTER — Telehealth: Payer: Self-pay

## 2021-05-13 ENCOUNTER — Ambulatory Visit (INDEPENDENT_AMBULATORY_CARE_PROVIDER_SITE_OTHER): Payer: PPO | Admitting: Pharmacist

## 2021-05-13 ENCOUNTER — Other Ambulatory Visit: Payer: Self-pay

## 2021-05-13 ENCOUNTER — Other Ambulatory Visit: Payer: Self-pay | Admitting: Adult Health

## 2021-05-13 DIAGNOSIS — Z794 Long term (current) use of insulin: Secondary | ICD-10-CM | POA: Diagnosis not present

## 2021-05-13 DIAGNOSIS — F419 Anxiety disorder, unspecified: Secondary | ICD-10-CM

## 2021-05-13 DIAGNOSIS — F17200 Nicotine dependence, unspecified, uncomplicated: Secondary | ICD-10-CM

## 2021-05-13 DIAGNOSIS — M810 Age-related osteoporosis without current pathological fracture: Secondary | ICD-10-CM | POA: Diagnosis not present

## 2021-05-13 DIAGNOSIS — K7581 Nonalcoholic steatohepatitis (NASH): Secondary | ICD-10-CM

## 2021-05-13 DIAGNOSIS — I10 Essential (primary) hypertension: Secondary | ICD-10-CM | POA: Diagnosis not present

## 2021-05-13 DIAGNOSIS — E1159 Type 2 diabetes mellitus with other circulatory complications: Secondary | ICD-10-CM

## 2021-05-13 DIAGNOSIS — E559 Vitamin D deficiency, unspecified: Secondary | ICD-10-CM

## 2021-05-13 DIAGNOSIS — E1169 Type 2 diabetes mellitus with other specified complication: Secondary | ICD-10-CM | POA: Diagnosis not present

## 2021-05-13 DIAGNOSIS — K7460 Unspecified cirrhosis of liver: Secondary | ICD-10-CM

## 2021-05-13 DIAGNOSIS — E785 Hyperlipidemia, unspecified: Secondary | ICD-10-CM | POA: Diagnosis not present

## 2021-05-13 DIAGNOSIS — I2510 Atherosclerotic heart disease of native coronary artery without angina pectoris: Secondary | ICD-10-CM

## 2021-05-13 DIAGNOSIS — E1122 Type 2 diabetes mellitus with diabetic chronic kidney disease: Secondary | ICD-10-CM

## 2021-05-13 DIAGNOSIS — J449 Chronic obstructive pulmonary disease, unspecified: Secondary | ICD-10-CM | POA: Diagnosis not present

## 2021-05-13 DIAGNOSIS — Z91148 Patient's other noncompliance with medication regimen for other reason: Secondary | ICD-10-CM

## 2021-05-13 DIAGNOSIS — I700 Atherosclerosis of aorta: Secondary | ICD-10-CM

## 2021-05-13 DIAGNOSIS — N182 Chronic kidney disease, stage 2 (mild): Secondary | ICD-10-CM

## 2021-05-13 DIAGNOSIS — Z9114 Patient's other noncompliance with medication regimen: Secondary | ICD-10-CM

## 2021-05-13 NOTE — Progress Notes (Signed)
    Chronic Care Management Pharmacy Assistant   Name: Sara Lamb  MRN: [id number] DOB: [birth year]    Reason for Encounter: Initial Chart Prep 10/18- Sara Lamb   Conditions to be addressed/monitored: CAD, HTN, HLD, COPD, DMII, CKD Stage 2, Osteoporosis, and Vitamin D deficiency, Chronic Hyponatremia, Anxiety, Liver cirrhosis   Recent office visits:  03/17/2021- Patient was seen for an annual physical exam. Started Pravastatin sodium 76m- one tab at night. Changed Doxycycline hyclate 103m take 1 capsule 2x/day with food until sores are resolved. Stopped Albuteral sulfate 2.5 nebulization, nitroglycerin 0.14m17mublingual, and rosuvastatin calcium 27m36m 02/12/2021- Patient was seen for a routine follow up. Started Doxycycline hyclate 100mg10m capsule 2x/day with food for 2 weeks. Changed Propanolol HCl to 10-27mg 16mdaily prn, for tremors/shakes.   Recent consult visits:  02/20/2021- Sara FloridaPatient was seen for trochanteric bursitis- right hip, fracture of unspecified of neck of right femur, and pain in right hip. No medication changes. X-rays were done.    Hospital visits:  None in previous 6 months  Medications: Outpatient Encounter Medications as of 05/13/2021  Medication Sig   acetaminophen (TYLENOL) 500 MG tablet Take 500 mg by mouth as directed. Take 2 tablets three times ad ay as needed   ALPRAZolam (XANAX) 1 MG tablet Take 1/2-1 tablet by mouth two to three times daily ONLY if needed for Anxiety Attack & limit to 5 days/week to avoid Addiction & Dementia.   aspirin EC 81 MG tablet Take 81 mg by mouth daily.   doxycycline (VIBRAMYCIN) 100 MG capsule Take 1 capsule 2 x/day with food until sores are resolved.   DULoxetine (CYMBALTA) 20 MG capsule Take 1 capsule (20 mg total) by mouth daily.   furosemide (LASIX) 40 MG tablet Take 1/2 to 1 tablet Daily if needed ofr Fluid Retention / leg/ankle swelling   gabapentin (NEURONTIN) 100 MG capsule Take 1 capsule (100  mg total) by mouth 3 (three) times daily.   glimepiride (AMARYL) 4 MG tablet Take1 tablet 2 x /day with Meals for Diabetes   Insulin Pen Needle (CAREFINE PEN NEEDLES) 32G X 4 MM MISC Use 1x a day   LANTUS SOLOSTAR 100 UNIT/ML Solostar Pen Inject 20 to 30 Units into the skin daily.   lisinopril (ZESTRIL) 5 MG tablet Take 1 tablet at night for BP and diabetic kidney protection.   pravastatin (PRAVACHOL) 20 MG tablet Take 1 tab at night for cholesterol.   propranolol (INDERAL) 20 MG tablet Take 0.5-1 tablets (10-20 mg total) by mouth 2 (two) times daily as needed. For tremors/shakes   No facility-administered encounter medications on file as of 05/13/2021.    Care Gaps: Covid-19 immunization  Star Rating Drugs: Lisinopril 5mg- 952m, 03/18/2021 Pravastatin 27mg- 947m 03/17/2021   [name] [phone]

## 2021-05-13 NOTE — Progress Notes (Signed)
Chronic Care Management Pharmacy Note  05/13/2021 Name:  Sara Lamb MRN:  [id number] DOB:  22-Jul-1951  Summary: Patient has made significant lifestyle modifications. Patient is still experiencing hip pain  Recommendations/Changes made from today's visit: -Patient is taking pravastatin 20 mg QOD and states it sets her "stomach on fire" but she is ok continuing QOD dosing -Discussed smoking cessation, patient is not ready  *Patient did not want to enroll in RPM for blood glucose monitoring by CPP or in [facility] for packaging services*     Subjective: Sara Lamb is an 70 y.o. year old female who is a primary patient of Unk Pinto, MD.  The CCM team was consulted for assistance with disease management and care coordination needs.    Engaged with patient face to face for initial visit in response to provider referral for pharmacy case management and/or care coordination services.   Consent to Services:  The patient was given the following information about Chronic Care Management services today, agreed to services, and gave verbal consent: 1. CCM service includes personalized support from designated clinical staff supervised by the primary care provider, including individualized plan of care and coordination with other care providers 2. 24/7 contact phone numbers for assistance for urgent and routine care needs. 3. Service will only be billed when office clinical staff spend 20 minutes or more in a month to coordinate care. 4. Only one practitioner may furnish and bill the service in a calendar month. 5.The patient may stop CCM services at any time (effective at the end of the month) by phone call to the office staff. 6. The patient will be responsible for cost sharing (co-pay) of up to 20% of the service fee (after annual deductible is met). Patient agreed to services and consent obtained.  Patient Care Team: Unk Pinto, MD as PCP - General (Internal  Medicine) Leonie Man, MD as PCP - Cardiology (Cardiology) Kinsinger, Arta Bruce, MD as Consulting Physician (General Surgery) Philemon Kingdom, MD as Consulting Physician (Internal Medicine) Armbruster, Carlota Raspberry, MD as Consulting Physician (Gastroenterology) [name], [facility] as Pharmacist (Pharmacist)  Recent office visits: 03/17/2021- Patient was seen for an annual physical exam. Started Pravastatin sodium 50m- one tab at night. Changed Doxycycline hyclate 10108m take 1 capsule 2x/day with food until sores are resolved. Stopped Albuteral sulfate 2.5 nebulization, nitroglycerin 0.50m53mublingual, and rosuvastatin calcium 50m49m  02/12/2021- Patient was seen for a routine follow up. Started Doxycycline hyclate 100mg36m capsule 2x/day with food for 2 weeks. Changed Propanolol HCl to 10-50mg 57mdaily prn, for tremors/shakes.    Recent consult visits: 02/20/2021- [place] seen for trochanteric bursitis- right hip, fracture of unspecified of neck of right femur, and pain in right hip. No medication changes. X-rays were done.    Hospital visits: None in previous 6 months   Objective:  Lab Results  Component Value Date   CREATININE 0.83 03/17/2021   BUN 17 03/17/2021   GFR 81.99 12/31/2017   GFRNONAA 68 12/03/2020   GFRAA 79 12/03/2020   NA 131 (L) 03/17/2021   K 4.3 03/17/2021   CALCIUM 9.6 03/17/2021   CO2 32 03/17/2021   GLUCOSE 89 03/17/2021    Lab Results  Component Value Date/Time   HGBA1C 8.7 (H) 03/17/2021 03:23 PM   HGBA1C 11.4 (H) 10/14/2020 03:19 PM   FRUCTOSAMINE 323 (H) 02/12/2021 11:43 AM   FRUCTOSAMINE 373 (H) 12/03/2020 03:11 PM   GFR 81.99 12/31/2017 12:04 PM  GFR 83.39 07/09/2017 11:32 AM   MICROALBUR 110.6 03/17/2021 03:23 PM   MICROALBUR 13.0 03/08/2019 04:13 PM    Last diabetic Eye exam: No results found for: HMDIABEYEEXA  Last diabetic Foot exam: No results found for: HMDIABFOOTEX   Lab Results  Component Value Date    CHOL 207 (H) 03/17/2021   HDL 40 (L) 03/17/2021   LDLCALC 127 (H) 03/17/2021   TRIG 245 (H) 03/17/2021   CHOLHDL 5.2 (H) 03/17/2021    Hepatic Function Latest Ref Rng & Units 03/17/2021 10/14/2020 06/24/2020  Total Protein 6.1 - 8.1 g/dL 7.1 6.6 7.3  Albumin 3.5 - 5.2 g/dL - - -  AST 10 - 35 U/L 20 15 18   ALT 6 - 29 U/L 16 16 18   Alk Phosphatase 39 - 117 U/L - - -  Total Bilirubin 0.2 - 1.2 mg/dL 0.3 0.2 0.4  Bilirubin, Direct 0.0 - 0.2 mg/dL - - -    Lab Results  Component Value Date/Time   TSH 1.36 03/17/2021 03:23 PM   TSH 0.90 10/14/2020 03:19 PM    CBC Latest Ref Rng & Units 03/17/2021 10/14/2020 06/24/2020  WBC 3.8 - 10.8 Thousand/uL 10.6 9.0 10.6  Hemoglobin 11.7 - 15.5 g/dL 13.8 12.9 14.0  Hematocrit 35.0 - 45.0 % 41.4 39.4 41.4  Platelets 140 - 400 Thousand/uL 239 269 289    Lab Results  Component Value Date/Time   VD25OH 13 (L) 03/17/2021 03:23 PM   VD25OH 12 (L) 03/12/2020 04:33 PM    Clinical ASCVD: Yes  The 10-year ASCVD risk score (Arnett DK, et al., 2019) is: 38%   Values used to calculate the score:     Age: 60 years     Sex: Female     Is Non-Hispanic African American: No     Diabetic: Yes     Tobacco smoker: Yes     Systolic Blood Pressure: 536 mmHg     Is BP treated: Yes     HDL Cholesterol: 40 mg/dL     Total Cholesterol: 207 mg/dL    Depression screen [facility] 2/9 11/01/2019 11/25/2018 08/16/2018  Decreased Interest 0 0 0  Down, Depressed, Hopeless 0 0 0  PHQ - 2 Score 0 0 0     See below as applicable Other: ([id number] if Afib, MMRC or CAT for COPD, ACT, DEXA)  Social History   Tobacco Use  Smoking Status Every Day   Packs/day: 2.00   Years: 53.00   Pack years: 106.00   Types: Cigarettes   Start date: 1969  Smokeless Tobacco Never  Tobacco Comments   Has smoked up to 2.5 packs/day, currently 2   BP Readings from Last 3 Encounters:  03/17/21 130/74  02/12/21 128/72  12/03/20 130/78   Pulse Readings from Last 3 Encounters:  03/17/21  73  02/12/21 88  12/03/20 83   Wt Readings from Last 3 Encounters:  03/17/21 137 lb 9.6 oz (62.4 kg)  02/12/21 137 lb (62.1 kg)  12/03/20 138 lb (62.6 kg)   BMI Readings from Last 3 Encounters:  03/17/21 28.76 kg/m  02/12/21 28.63 kg/m  12/03/20 28.84 kg/m    Assessment/Interventions: Review of patient past medical history, allergies, medications, health status, including review of consultants reports, laboratory and other test data, was performed as part of comprehensive evaluation and provision of chronic care management services.   SDOH:  (Social Determinants of Health) assessments and interventions performed: Yes SDOH Interventions    Flowsheet Row Most Recent Value  SDOH Interventions  Housing Interventions Intervention Not Indicated  Transportation Interventions Intervention Not Indicated      SDOH Screenings   Alcohol Screen: Not on file  Depression (PHQ2-9): Not on file  Financial Resource Strain: Not on file  Food Insecurity: Not on file  Housing: Index Risk Score: 0  Physical Activity: Not on file  Social Connections: Not on file  Stress: Not on file  Tobacco Use: High Risk   Smoking Tobacco Use: Every Day   Smokeless Tobacco Use: Never  Transportation Needs: No Transportation Needs   Lack of Transportation (Medical): No   Lack of Transportation (Non-Medical): No    CCM Care Plan  Allergies  Allergen Reactions   Actos [Pioglitazone] Other (See Comments)    Patient stated that it elevated her BGL   Glyburide Other (See Comments)    Patient stated that it elevated her BGL   Metformin And Related     "metformin caused mini-strokes" and ELEVATED BS and also GLYBURIDE, ONGLYZA, ACTOS, INVOKANA caused elevated BS   Onglyza [Saxagliptin] Other (See Comments)    Patient stated that it elevated her BGL   Rosuvastatin Diarrhea   Insulins Rash   Janumet [Sitagliptin-Metformin Hcl] Rash and Other (See Comments)    Shakes/ rash    Penicillins Rash and Other (See Comments)    Medications Reviewed Today     Reviewed by [name], [facility] (Pharmacist) on 05/13/21 at 1524  Med List Status: <None>   Medication Order Taking? Sig Documenting Provider Last Dose Status Informant  acetaminophen (TYLENOL) 500 MG tablet [id number] Yes Take 500 mg by mouth as directed. Take 2 tablets three times ad ay as needed [provider] Taking Active   ALPRAZolam Duanne Moron) 1 MG tablet [id number] Yes Take 1/2-1 tablet by mouth two to three times daily ONLY if needed for Anxiety Attack & limit to 5 days/week to avoid Addiction & Dementia. Magda Bernheim, NP Taking Active   aspirin EC 81 MG tablet [id number] Yes Take 81 mg by mouth daily. [provider] Taking Active   doxycycline (VIBRAMYCIN) 100 MG capsule [id number] Yes Take 1 capsule 2 x/day with food until sores are resolved. Liane Comber, NP Taking Active   DULoxetine (CYMBALTA) 20 MG capsule [id number] Yes Take 1 capsule (20 mg total) by mouth daily. Liane Comber, NP Taking Active   furosemide (LASIX) 40 MG tablet [id number] Yes Take 1/2 to 1 tablet Daily if needed ofr Fluid Retention / leg/ankle swelling Unk Pinto, MD Taking Active   gabapentin (NEURONTIN) 100 MG capsule [id number] Yes Take 1 capsule (100 mg total) by mouth 3 (three) times daily. Unk Pinto, MD Taking Active   glimepiride (AMARYL) 4 MG tablet [id number] Yes Take1 tablet 2 x /day with Meals for Diabetes Liane Comber, NP Taking Active   Insulin Pen Needle (CAREFINE PEN NEEDLES) 32G X 4 MM MISC [id number] Yes Use 1x a day Philemon Kingdom, MD Taking Active Self  LANTUS SOLOSTAR 100 UNIT/ML Solostar Pen [id number] Yes Inject 20 to 30 Units into the skin daily.  Patient taking differently: Inject 23 Units into the skin at bedtime.   Magda Bernheim, NP Taking Active   lisinopril (ZESTRIL) 5 MG tablet [id number] Yes Take 1 tablet at night for BP and diabetic kidney protection.  Patient taking differently:  2.5 mg. Take 1 tablet at night for BP and diabetic kidney protection.   Liane Comber, NP Taking Active   pravastatin (PRAVACHOL) 20 MG tablet [id number] Yes Take 1 tab  at night for cholesterol.  Patient taking differently: 1 every other day   Liane Comber, NP Taking Active   propranolol (INDERAL) 20 MG tablet [id number] Yes TAKE 1/2 TO 1 TABLET BY MOUTH TWICE DAILY AS NEEDED FOR tremors Wandra Feinstein Magda Bernheim, NP Taking Active             Patient Active Problem List   Diagnosis Date Noted   Rash and other nonspecific skin eruption 02/12/2021   CKD stage 2 due to type 2 diabetes mellitus ([place]) 02/11/2021   Overweight (BMI 25.0-29.9) 10/11/2020   Chest pain with low risk for cardiac etiology 06/24/2020   Type 2 diabetes mellitus with circulatory disorder ([place]) 11/24/2018   Chronic hyponatremia 03/18/2018   Coronary artery disease, non-occlusive: RCA 60%, FFR Negative. 02/16/2018   Cognitive dysfunction 03/27/2017   Liver cirrhosis secondary to NASH ([place]) 11/23/2016   Osteoporosis 05/27/2016   Atherosclerosis of abdominal aorta ([place]) - per CT 12/2016 04/30/2016   COPD (chronic obstructive pulmonary disease) ([place]) 04/30/2015   Tobacco use disorder 04/08/2015   Poor compliance with medication 11/05/2014   Vitamin D deficiency    Anxiety    Essential hypertension    Hyperlipidemia associated with type 2 diabetes mellitus ([place])     Immunization History  Administered Date(s) Administered   Hep A / Hep B 01/07/2017, 02/08/2017, 07/09/2017   Td 06/26/2001   Tdap 04/30/2015    Conditions to be addressed/monitored:  Hypertension, Hyperlipidemia, Diabetes, Coronary Artery Disease, COPD, Chronic Kidney Disease, Anxiety, Osteoporosis, and tobacco usage, Liver cirrhosis, hip pain  Care Plan : General Pharmacy (Adult)  Updates made by [name], [place] since 05/13/2021 12:00 AM     Problem: Hypertension, Hyperlipidemia, Diabetes, Coronary Artery Disease, COPD, Chronic Kidney Disease,  Anxiety, Osteoporosis, and tobacco usage, Liver cirrhosis, hip pain      Long-Range Goal: Patient-Specific Goal   Start Date: 05/13/2021  Expected End Date: 05/13/2022  Priority: High  Note:    Current Barriers:  Unable to achieve control of A1C  Suboptimal therapeutic regimen for HLD/ CAD  Pharmacist Clinical Goal(s):  Patient will achieve adherence to monitoring guidelines and medication adherence to achieve therapeutic efficacy through collaboration with PharmD and provider.   Interventions: 1:1 collaboration with Unk Pinto, MD regarding development and update of comprehensive plan of care as evidenced by provider attestation and co-signature Inter-disciplinary care team collaboration (see longitudinal plan of care) Comprehensive medication review performed; medication list updated in electronic medical record  Hypertension (BP goal <130/80) -Controlled -Current treatment: Lisinopril 2.24m daily (appropriate, effective, safe, accessible) Propranolol 20 mg 1/2 to 1 tablet BID PRN (appropriate, effective, safe, accessible) Furosemide 40 mg 1/2 to 1 tablet daily PRN (appropriate, effective, safe, accessible) -Medications previously tried: None Patient is taking propranolol for tremors and furosemide for fluid retention with secondary HTN benefits -Current home readings: Checking occasionally, usually between 126 - 130 -Current dietary habits: Focusing on salads and avoiding carbs -Current exercise habits: Limited due to hip pain -Denies hypotensive/hypertensive symptoms -Educated on BP goals and benefits of medications for prevention of heart attack, stroke and kidney damage; Daily salt intake goal < 2300 mg; Importance of home blood pressure monitoring; Symptoms of hypotension and importance of maintaining adequate hydration; -Counseled to monitor BP at home occasionally, document, and provide log at future appointments -Counseled on diet and exercise  extensively Recommended to continue current medication Counseled on smoking cessation  Hyperlipidemia: (LDL goal < 70) -Uncontrolled -Current treatment: Pravastatin 20 mg QOD (appropriate, query effective) Atheroscleorsis of  abdominal aorta (CT 2018), CAD (followed by Dr. Ellyn Hack) Reports "stomach on fire" from taking pravastatin daily -Medications previously tried: Rosuvastatin  -Current dietary patterns: Avoiding fried foods, limits red meat, has increased vegetables -Current exercise habits: Limited due to hip pain -Educated on Cholesterol goals;  Benefits of statin for ASCVD risk reduction; Importance of limiting foods high in cholesterol; -Counseled on diet and exercise extensively Recommended to continue current medication To assess further after November labs Counseled on smoking cessation  Diabetes (A1c goal <7%) -Uncontrolled but improving (11.4 09/2019; 8.7 02/2021) -Current medications: Lantus 23 units SQ HS (appropriate, effective, safe, accessible) Glimepiride 4 mg daily -Medications previously tried: Actos, glyburide, metformin, onglyza, various insulins, janumet  Tolerability issues with statins, recently started on pravastatin Refuses eye exams -Current home glucose readings fasting glucose: 105-187 (180's twice, mainly 130's, 140's) post prandial glucose: 89 - 189 -Reports hypoglycemic/hyperglycemic symptoms (lists 89 as a 'low') -Current meal patterns:  focusing on salads and avoiding carbs Drinks coke zero  -Current exercise: limited due to hip pain -Educated on A1c and blood sugar goals; Complications of diabetes including kidney damage, retinal damage, and cardiovascular disease; Benefits of weight loss; Proper insulin injection technique; Prevention and management of hypoglycemic episodes; Benefits of routine self-monitoring of blood sugar; -Counseled to check feet daily and get yearly eye exams -Counseled on diet and exercise extensively Recommended to  continue current medication Counseled on BG goals and continued BG monitoring  Recommended BG RPM, patient refused Educated on avoiding hypoglycemia and medication compliance DM assessment November Counseled on smoking cessation  COPD (Goal: control symptoms and prevent exacerbations) -Controlled -Current treatment  None -Medications previously tried: None  -Exacerbations requiring treatment in last 6 months: None Patient denies sx of COPD but reports occasional wheezing. Patient also has a productive cough at the visit indicative of mucus buildup. Patient is currently smoking 2  packs per day and is unwilling to quit -Counseled on calling PCP or CPP if condition worsens and smoking cessation  Anxiety  -Controlled -Current treatment: Alprazolam 1/2 to 1 tablet 2-3x/ day for 5 days (appropriate, effective, safe, accessible) Duloxetine 20 mg daily (appropriate, effective, safe, accessible) -Medications previously tried/failed: None -GAD7: Did not conduct, Anxiety assessment due December Patient is taking duloxetine 20 mg PRN Taking alprazolam 1 - 1.5 tablets per day -Recommended to continue current medication Counseled on taking duloxetine regularly  Tobacco use -Uncontrolled -Previous quit attempts: one, when patient was in nursing home -Current treatment  None Patient is smoking 2 packs per day -Educated on benefits of smoking cessation, patient is not ready  Osteoporosis  -Uncontrolled -Last DEXA Scan: 05/15/2016  DualFemur Neck Right 05/15/2016     -3.2  AP Spine  L1-L4      05/15/2016     -1.6   Patient does not meet criteria for FRAX assessment. -Patient is a candidate for pharmacologic treatment due to T-Score < -2.5 in femoral neck -Current treatment  None -Medications previously tried: N/A  -Recommend [phone] units of vitamin D daily. Recommend 1200 mg of calcium daily from dietary and supplemental sources. Recommend weight-bearing and muscle strengthening  exercises for building and maintaining bone density. -Counseled on diet and exercise extensively Counseled on smoking cessation  Chronic Kidney Disease  GFR: 76 03/17/2021 -All medications assessed for renal dosing and appropriateness in chronic kidney disease. -Counseled on diet and exercise extensively Counseled on BP and BG control Counseled on smoking cessation Limit salt in take   Liver Cirrhosis secondary to NASH Avoid hepatotoxic agents  Limit carbs Declines GI follow up (was followed by Dr. Havery Moros in the past) -Counseled on diet and exercise extensively Counseled on smoking cessation  Hip pain -Uncontrolled -Current treatment  Duloxetine 20 mg daily (appropriate, query effective) Tylenol PRN -Medications previously tried: N/A  -Recommended to continue current medication Followed by ortho in the past Patient to call if pain worsens  Health Maintenance -Vaccine gaps: patient does not wish to get vaccinated  Patient Goals/Self-Care Activities Patient will:  - take medications as prescribed focus on medication adherence by possibly using a pill box  Follow Up Plan: Telephone follow up appointment with care management team member scheduled for: 10/28/2021       Medication Assistance: None required.  Patient affirms current coverage meets needs.  Compliance/Adherence/Medication fill history: Care Gaps: COVID-19  Star-Rating Drugs: Lisinopril 75m- 90ds, 03/18/2021 Pravastatin 278m 90ds, 03/17/2021   [place] [facility] [id number]: [phone]: [phone] pill box? No - takes medication out of bottle (has indications written on them) Pt endorses 99% compliance  We discussed: Benefits of medication synchronization, packaging and delivery as well as enhanced pharmacist oversight with [facility]. Patient decided to: Continue current  medication management strategy  Care Plan and Follow Up Patient Decision:  Patient agrees to Care Plan and Follow-up.  Plan: Telephone follow up appointment with care management team member scheduled for:  10/28/2021
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
## 2021-05-22 ENCOUNTER — Telehealth: Payer: Self-pay

## 2021-05-22 NOTE — Telephone Encounter (Signed)
Patient concerned about her blood sugars running between 66-70. Provider aware and instructed patient to cut the glimepiride in half in the morning and 1 tablet in the evening with dinner. And keeping the insulin the way it is.

## 2021-05-26 DIAGNOSIS — J449 Chronic obstructive pulmonary disease, unspecified: Secondary | ICD-10-CM | POA: Diagnosis not present

## 2021-05-26 DIAGNOSIS — I10 Essential (primary) hypertension: Secondary | ICD-10-CM | POA: Diagnosis not present

## 2021-05-27 DIAGNOSIS — M810 Age-related osteoporosis without current pathological fracture: Secondary | ICD-10-CM | POA: Diagnosis not present

## 2021-05-27 DIAGNOSIS — Z8781 Personal history of (healed) traumatic fracture: Secondary | ICD-10-CM | POA: Diagnosis not present

## 2021-06-04 ENCOUNTER — Other Ambulatory Visit: Payer: Self-pay | Admitting: Nurse Practitioner

## 2021-06-17 NOTE — Progress Notes (Signed)
MEDICARE ANNUAL WELLNESS VISIT AND 3monthFOLLOW UP  Assessment:   GCelinewas seen today for medicare wellness.  Diagnoses and all orders for this visit:  Encounter for Medicare annual wellness exam Declines all vaccines, mammogram, colon cancer screening, DEXA follow up despite discussion of risks. She is requesting minimal interventions. She has also refused further low dose CT scans  Atherosclerosis of abdominal aorta (HCC) Control blood pressure, cholesterol, glucose, increase exercise.   CAD STOP SMOKING Continue medications: Aspirin 879m Lisinopril 36m61mpropranolol 54m43mllows with Cardiology, Dr HardEllyn Hackhas stress test scheduled due to angina Control blood pressure, cholesterol, glucose, increase exercise.   Essential hypertension Monitor blood pressure at home; call if consistently over 130/80 Continue DASH diet.   Reminder to go to the ER if any CP, SOB, nausea, dizziness, severe HA, changes vision/speech, left arm numbness and tingling and jaw pain. 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 Denies any barriers despite questioning. Discussed dietary and exercise modifications. -     Hemoglobin A1c  L[facility] cirrhosis secondary to [facility]H [facility] for this. -     COMPLETE METABOLIC PANEL WITH GFR  Chronic obstructive pulmonary disease, unspecified COPD type (HCC)[place] sx; STOP smoking; monitor   Hyperlipidemia associated with type 2 diabetes mellitus (HCC)/ Statin intolerance Had tried Rosuvastatin and claims it made her sick, refuses statin medication Discussed dietary and exercise modifications. -     Lipid panel  Osteoporosis, unspecified  osteoporosis type, unspecified pathological fracture presence Due for DEXA scan -states she had with her insurance company Discussed Calcium and Vit D supplementation  Tobacco use disorder Discussed smoking cessation.  Reports she is smoking 2 packs a day  She is not ready to quit -lung cancer screening with low dose CT discussed as recommended by guidelines based on age, number of pack year history.  Discussed risks of screening including but not limited to false positives on xray, further testing or consultation with specialist, and possible false negative CT as well. Refuses another CT scan.   Chronic hyponatremia Monitor;  -     COMPLETE METABOLIC PANEL WITH GFR  Anxiety Stress management techniques discussed, increase water, good sleep hygiene discussed, increase exercise, and increase veggies.  Continue Alprazolam 1/2-1 tab 2-3 times a day. Limit to 5 days a week to avoid addiction and increased risk of dementia  Vitamin D deficiency -     VITAMIN D 25 Hydroxy (Vit-D Deficiency, Fractures)  Poor compliance with medication Discussed this at length with patient.    Over 40 minutes of exam, counseling, chart review and critical decision making was performed Future Appointments  Date Time Provider [place]/20[place]  9:00 AM AdegNewton PiggH [facility] None  03/18/2022  2:00 PM CorbLiane Comber [facility] None  06/23/2022  4:00 PM MullMagda Bernheim [facility] None     Plan:   During the course of the visit the patient was educated and counseled about appropriate screening and preventive services including:   Pneumococcal vaccine  Prevnar 13 Influenza vaccine Td vaccine Screening electrocardiogram Bone densitometry screening Colorectal cancer screening Diabetes screening Glaucoma screening Nutrition counseling  Advanced directives: requested   Subjective:  Sara Lamb  Sara Lamb is a 70 y.o. MWF female who presents for Medicare Annual Wellness Visit and 3  month follow up.  She has HTN, HLD, DMII with AA, NASH, Osteoporosis, Tobacco use, Vit D Defciency, COPD, chronic hyponatremia, anxiety and poor medication compliance.  She is very noncompliant with test, vaccines, and has an aversion to medication with " reactions" to the majority of medications.   She uses WD40 on her hip and states it helps the pain.   She is on xanax 1 pill every night due to sleep  Also taking 1/2 tab in the afternoon due to shakes and anxiety. Very stressed due to husband's health. She has not tried any daily agent. She reports trazodone made her more irritated/kept her awake. Discussed low dose SSRI-   She has cirrhosis due to NASH, has RUQ pain, following with GI Dr. Havery Moros, EGD 01/2017, no varices.   She has COPD by imaging, denies dyspnea or secretions, continues to smoke, reports has reduced from 2  packs recently. CT Lung Cancer screen 07/23/20 benign appearance- aortic atherosclerosis and emphysema. She refuses another CT lung cancer screening  BMI is Body mass index is 28.05 kg/m., she has not been working on diet and exercise. She states she is not normally hungry, will only eat 1-2 meals a day. 1 is normally a salad Wt Readings from Last 3 Encounters:  06/23/21 134 lb 3.2 oz (60.9 kg)  03/17/21 137 lb 9.6 oz (62.4 kg)  02/12/21 137 lb (62.1 kg)   Had cath 2019 showed Prox RCA to Mid RCA lesion is 60% stenosed. FFR negative- following with Dr. Ellyn Hack. Myoview negative for ischemia . Her blood pressure has been controlled at home, today their BP is BP: (!) 148/70 BP Readings from Last 3 Encounters:  06/23/21 (!) 148/70  03/17/21 130/74  02/12/21 128/72    She does not workout. She denies shortness of breath, dizziness.   She has aortic atherosclerosis per CT 12/2016 She is not on cholesterol medication. Her cholesterol is at goal. The cholesterol last visit was:   Lab Results  Component Value Date   CHOL 207 (H) 03/17/2021   HDL 40 (L) 03/17/2021    LDLCALC 127 (H) 03/17/2021   TRIG 245 (H) 03/17/2021   CHOLHDL 5.2 (H) 03/17/2021   She has had diabetes for 20 years. She has not been working on diet and exercise for diabetes, and denies hyperglycemia, hypoglycemia , nausea, paresthesia of the feet, polydipsia, polyuria, visual disturbances, vomiting and weight loss. She is prescribed glimepiride 4 mg BID, lantus 23 units PM. Fasting range 96-140 typically 120s.  Lab Results  Component Value Date   HGBA1C 8.7 (H) 03/17/2021   Last GFR: Lab Results  Component Value Date   GFRNONAA 68 12/03/2020   Patient is on Vitamin D supplement, taking 1000 units three days a week, GI upset with higher doses.   Lab Results  Component Value Date   VD25OH 13 (L) 03/17/2021      Medication Review: Current Outpatient Medications on File Prior to Visit  Medication Sig Dispense Refill   acetaminophen (TYLENOL) 500 MG tablet Take 500 mg by mouth as directed. Take 2 tablets three times ad ay as needed     ALPRAZolam (XANAX) 1 MG tablet Take 1/2 to 1 tablet by mouth two to three times daily ONLY if needed for Anxiety Attack. limit to 5 days/week to avoid Addiction and Dementia. 55 tablet 0   aspirin EC 81 MG tablet Take 81 mg by  mouth daily.     doxycycline (VIBRAMYCIN) 100 MG capsule Take 1 capsule 2 x/day with food until sores are resolved. 30 capsule 0   DULoxetine (CYMBALTA) 20 MG capsule Take 1 capsule (20 mg total) by mouth daily. 90 capsule 1   gabapentin (NEURONTIN) 100 MG capsule Take 1 capsule (100 mg total) by mouth 3 (three) times daily. 270 capsule 1   glimepiride (AMARYL) 4 MG tablet Take1 tablet 2 x /day with Meals for Diabetes 180 tablet 0   LANTUS SOLOSTAR 100 UNIT/ML Solostar Pen Inject 20 to 30 Units into the skin daily. (Patient taking differently: Inject 23 Units into the skin at bedtime.) 15 mL 1   lisinopril (ZESTRIL) 5 MG tablet Take 1 tablet at night for BP and diabetic kidney protection. (Patient taking differently: 2.5 mg. Take 1  tablet at night for BP and diabetic kidney protection.) 90 tablet 1   propranolol (INDERAL) 20 MG tablet TAKE 1/2 TO 1 TABLET BY MOUTH TWICE DAILY AS NEEDED FOR tremors /shakes 60 tablet 0   Insulin Pen Needle (CAREFINE PEN NEEDLES) 32G X 4 MM MISC Use 1x a day 100 each 3   No current facility-administered medications on file prior to visit.    Allergies  Allergen Reactions   Actos [Pioglitazone] Other (See Comments)    Patient stated that it elevated her BGL   Glyburide Other (See Comments)    Patient stated that it elevated her BGL   Metformin And Related     "metformin caused mini-strokes" and ELEVATED BS and also GLYBURIDE, ONGLYZA, ACTOS, INVOKANA caused elevated BS   Onglyza [Saxagliptin] Other (See Comments)    Patient stated that it elevated her BGL   Rosuvastatin Diarrhea   Insulins Rash   Janumet [Sitagliptin-Metformin Hcl] Rash and Other (See Comments)    Shakes/ rash   Penicillins Rash and Other (See Comments)    Current Problems (verified) Patient Active Problem List   Diagnosis Date Noted   Rash and other nonspecific skin eruption 02/12/2021   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 02/16/2018   Cognitive dysfunction 03/27/2017   Liver cirrhosis secondary to NASH ([place]) 11/23/2016   Osteoporosis 05/27/2016   Atherosclerosis of abdominal aorta ([place]) - per CT 12/2016 04/30/2016   COPD (chronic obstructive pulmonary disease) ([place]) 04/30/2015   Tobacco use disorder 04/08/2015   Poor compliance with medication 11/05/2014   Vitamin D deficiency    Anxiety    Essential hypertension    Hyperlipidemia associated with type 2 diabetes mellitus ([place])     Screening Tests Immunization  History  Administered Date(s) Administered   Hep A / Hep B 01/07/2017, 02/08/2017, 07/09/2017   Td 06/26/2001   Tdap 04/30/2015   Preventative care: Last colonoscopy: 12/2016, polyps, Dr. Havery Moros, 3 year follow up due , pt refuses EGD: 01/2017 gastritis, no varices  Last mammogram: 05/25/2016 OVERDUE - declines, would not pursue treatment if positive  Last pap smear/pelvic exam: remote, declines  DEXA: 04/2016 - R fem T -3.2, declines follow up at this time 05/27/21 had DEXA at home on finger -2 osteopenia  MRI brain 2016 CT chest 10/2017 IMPRESSION: 1. Multiple scattered millimetric peripheral pulmonary nodules are unchanged and considered benign. 2. Aortic atherosclerosis (ICD10-170.0).  Three-vessel coronary artery calcification. 3. Cirrhosis.  Prior vaccinations: DECLINES ALL VACCINES TD or Tdap: 2016   Names of Other Physician/Practitioners you currently use: 1. [facility] here for primary care 2. none, eye doctor, encouraged to see eye doctor - she states will call Dr. Gilford Rile in [place] for diabetes eye exam  3. Dentures dentist  Patient Care Team: Unk Pinto, MD as PCP - General (Internal Medicine) Leonie Man, MD as PCP - Cardiology (Cardiology) Kinsinger, Arta Bruce, MD as Consulting Physician (General Surgery) Philemon Kingdom, MD as Consulting Physician (Internal Medicine) Armbruster, Carlota Raspberry, MD as Consulting Physician (Gastroenterology) [name], [facility] as Pharmacist (Pharmacist)  SURGICAL HISTORY She  has a past surgical history that includes Vaginal hysterectomy (age 24); Bilateral salpingoophorectomy (1997); Knee arthroscopy (Right, 11/2014); Cholecystectomy (N/A, 09/30/2016); LEFT HEART CATH AND CORONARY ANGIOGRAPHY (N/A, 04/12/2018); INTRAVASCULAR PRESSURE WIRE/FFR STUDY (N/A, 04/12/2018); transthoracic echocardiogram (02/2018); NM MYOVIEW LTD (07/03/2020); and ORIF hip fracture (Right, 2021). FAMILY  HISTORY Her family history includes Breast cancer (age of onset: 56) in her sister; CAD in her maternal aunt, maternal uncle, and sister; CAD (age of onset: 34) in her mother; Cancer in her father; Coronary artery disease (age of onset: 59) in her brother; Diabetes in her brother, father, and mother; Heart attack in her maternal grandmother; Heart attack (age of onset: 37) in her sister; Hyperlipidemia in her brother, sister, and sister; Hypertension in her brother, father, and sister; Kidney Stones in her son; Stomach cancer in her mother; Stroke in her mother.  SOCIAL HISTORY She  reports that she has been smoking cigarettes. She started smoking about 53 years ago. She has a 106.00 pack-year smoking history. She has never used smokeless tobacco. She reports that she does not drink alcohol and does not use drugs.   MEDICARE WELLNESS OBJECTIVES: Physical activity: Current Exercise Habits: The patient does not participate in regular exercise at present Cardiac risk factors: Cardiac Risk Factors include: advanced age (>16mn, >>24women);diabetes mellitus;dyslipidemia;hypertension;smoking/ tobacco exposure;sedentary lifestyle Depression/mood screen:   [facility] 06/23/2021  Decreased Interest 0  Down, Depressed, Hopeless 0  PHQ - 2 Score 0    ADLs:  In your present state of health, do you have any difficulty performing the following activities: 06/23/2021 06/24/2020  Hearing? N N  Vision? N N  Difficulty concentrating or making decisions? N N  Walking or climbing stairs? Y Y  Comment - using walker, 1 step at home, can manage with handrail  Dressing or bathing? N N  Doing errands, shopping? N Y  Some recent data might be hidden     Cognitive Testing  Alert? Yes  Normal Appearance?Yes  Oriented to person? Yes  Place? Yes   Time? Yes  Recall of three objects?  Yes  Can perform simple calculations? Yes  Displays appropriate judgment?Yes  Can read the correct time from a  watch face?Yes  EOL planning: Does Patient Have a Medical Advance Directive?: Yes Type of Advance Directive: Healthcare Power of Attorney, Living will Does patient want to make changes to medical advance directive?: No - Patient declined Copy of HGrand Canein Chart?: No - copy requested  Review of Systems  Constitutional:  Negative for malaise/fatigue and weight loss.  HENT:  Negative for hearing loss and tinnitus.   Eyes:  Negative for blurred vision and double vision.  Respiratory:  Negative for cough, sputum production, shortness of breath and wheezing.   Cardiovascular:  Negative for chest pain,  palpitations, orthopnea, claudication, leg swelling and PND.  Gastrointestinal:  Negative for abdominal pain, blood in stool, constipation, diarrhea, heartburn, melena, nausea and vomiting.  Genitourinary: Negative.   Musculoskeletal:  Positive for joint pain (right hip). Negative for falls and myalgias.  Skin:  Negative for rash.  Neurological:  Negative for dizziness, tingling, sensory change, weakness and headaches.  Endo/Heme/Allergies:  Negative for polydipsia.  Psychiatric/Behavioral:  Negative for depression, memory loss, substance abuse and suicidal ideas. The patient is nervous/anxious. The patient does not have insomnia.   All other systems reviewed and are negative.   Objective:     Today's Vitals   06/23/21 1527  BP: (!) 148/70  Pulse: 77  Temp: 97.9 F (36.6 C)  SpO2: 99%  Weight: 134 lb 3.2 oz (60.9 kg)   Body mass index is 28.05 kg/m.  General appearance: alert, no distress, WD/WN, female HEENT: normocephalic, sclerae anicteric, TMs pearly, nares patent, no discharge or erythema, pharynx normal Oral cavity: MMM, no lesions Neck: supple, no lymphadenopathy, no thyromegaly, no masses Heart: RRR, normal S1, S2, murmurs Lungs: CTA bilaterally, no wheezes, rhonchi, or rales Abdomen: +bs, soft, non tender, non distended, no masses, no hepatomegaly, no  splenomegaly Musculoskeletal: no swelling, no obvious deformity Extremities: no edema, no cyanosis Pulses: 2+ symmetric, upper and lower extremities, normal cap refill Neurological: alert, oriented x 3, CN2-12 intact, strength normal upper extremities and left lower extremity, right lower extremity has decreased strength, sensation normal throughout, DTRs 2+ throughout, 3+ patellar reflex, no cerebellar signs, Antalgic gait uses walker Psychiatric: normal affect, behavior normal, pleasant   Medicare Attestation I have personally reviewed: The patient's medical and social history Their use of alcohol, tobacco or illicit drugs Their current medications and supplements The patient's functional ability including ADLs,fall risks, home safety risks, cognitive, and hearing and visual impairment Diet and physical activities Evidence for depression or mood disorders  The patient's weight, height, BMI, and visual acuity have been recorded in the chart.  I have made referrals, counseling, and provided education to the patient based on review of the above and I have provided the patient with a written personalized care plan for preventive services.     Magda Bernheim, NP   06/23/2021

## 2021-06-23 ENCOUNTER — Ambulatory Visit (INDEPENDENT_AMBULATORY_CARE_PROVIDER_SITE_OTHER): Payer: PPO | Admitting: Nurse Practitioner

## 2021-06-23 ENCOUNTER — Encounter: Payer: Self-pay | Admitting: Nurse Practitioner

## 2021-06-23 ENCOUNTER — Other Ambulatory Visit: Payer: Self-pay

## 2021-06-23 VITALS — BP 148/70 | HR 77 | Temp 97.9°F | Wt 134.2 lb

## 2021-06-23 DIAGNOSIS — E785 Hyperlipidemia, unspecified: Secondary | ICD-10-CM | POA: Diagnosis not present

## 2021-06-23 DIAGNOSIS — Z9114 Patient's other noncompliance with medication regimen: Secondary | ICD-10-CM | POA: Diagnosis not present

## 2021-06-23 DIAGNOSIS — I700 Atherosclerosis of aorta: Secondary | ICD-10-CM

## 2021-06-23 DIAGNOSIS — E1122 Type 2 diabetes mellitus with diabetic chronic kidney disease: Secondary | ICD-10-CM | POA: Diagnosis not present

## 2021-06-23 DIAGNOSIS — F17200 Nicotine dependence, unspecified, uncomplicated: Secondary | ICD-10-CM | POA: Diagnosis not present

## 2021-06-23 DIAGNOSIS — E1159 Type 2 diabetes mellitus with other circulatory complications: Secondary | ICD-10-CM | POA: Diagnosis not present

## 2021-06-23 DIAGNOSIS — E1169 Type 2 diabetes mellitus with other specified complication: Secondary | ICD-10-CM

## 2021-06-23 DIAGNOSIS — Z91148 Patient's other noncompliance with medication regimen for other reason: Secondary | ICD-10-CM

## 2021-06-23 DIAGNOSIS — Z794 Long term (current) use of insulin: Secondary | ICD-10-CM

## 2021-06-23 DIAGNOSIS — E1151 Type 2 diabetes mellitus with diabetic peripheral angiopathy without gangrene: Secondary | ICD-10-CM

## 2021-06-23 DIAGNOSIS — K7460 Unspecified cirrhosis of liver: Secondary | ICD-10-CM

## 2021-06-23 DIAGNOSIS — F419 Anxiety disorder, unspecified: Secondary | ICD-10-CM | POA: Diagnosis not present

## 2021-06-23 DIAGNOSIS — Z789 Other specified health status: Secondary | ICD-10-CM

## 2021-06-23 DIAGNOSIS — E871 Hypo-osmolality and hyponatremia: Secondary | ICD-10-CM

## 2021-06-23 DIAGNOSIS — E1165 Type 2 diabetes mellitus with hyperglycemia: Secondary | ICD-10-CM | POA: Diagnosis not present

## 2021-06-23 DIAGNOSIS — J449 Chronic obstructive pulmonary disease, unspecified: Secondary | ICD-10-CM | POA: Diagnosis not present

## 2021-06-23 DIAGNOSIS — I10 Essential (primary) hypertension: Secondary | ICD-10-CM

## 2021-06-23 DIAGNOSIS — N182 Chronic kidney disease, stage 2 (mild): Secondary | ICD-10-CM

## 2021-06-23 DIAGNOSIS — Z0000 Encounter for general adult medical examination without abnormal findings: Secondary | ICD-10-CM

## 2021-06-23 DIAGNOSIS — K7581 Nonalcoholic steatohepatitis (NASH): Secondary | ICD-10-CM

## 2021-06-23 DIAGNOSIS — R6889 Other general symptoms and signs: Secondary | ICD-10-CM

## 2021-06-23 DIAGNOSIS — Z0001 Encounter for general adult medical examination with abnormal findings: Secondary | ICD-10-CM | POA: Diagnosis not present

## 2021-06-23 NOTE — Patient Instructions (Signed)

## 2021-06-24 LAB — CBC WITH DIFFERENTIAL/PLATELET
Absolute Monocytes: 788 cells/uL (ref 200–950)
Basophils Absolute: 119 cells/uL (ref 0–200)
Basophils Relative: 1.1 %
Eosinophils Absolute: 864 cells/uL — ABNORMAL HIGH (ref 15–500)
Eosinophils Relative: 8 %
HCT: 42.7 % (ref 35.0–45.0)
Hemoglobin: 14.6 g/dL (ref 11.7–15.5)
Lymphs Abs: 2549 cells/uL (ref 850–3900)
MCH: 31.2 pg (ref 27.0–33.0)
MCHC: 34.2 g/dL (ref 32.0–36.0)
MCV: 91.2 fL (ref 80.0–100.0)
MPV: 10.1 fL (ref 7.5–12.5)
Monocytes Relative: 7.3 %
Neutro Abs: 6480 cells/uL (ref 1500–7800)
Neutrophils Relative %: 60 %
Platelets: 272 10*3/uL (ref 140–400)
RBC: 4.68 10*6/uL (ref 3.80–5.10)
RDW: 11.9 % (ref 11.0–15.0)
Total Lymphocyte: 23.6 %
WBC: 10.8 10*3/uL (ref 3.8–10.8)

## 2021-06-24 LAB — LIPID PANEL
Cholesterol: 219 mg/dL — ABNORMAL HIGH (ref <200)
HDL: 48 mg/dL — ABNORMAL LOW (ref >=50)
LDL Cholesterol (Calc): 139 mg/dL (calc) — ABNORMAL HIGH
Non-HDL Cholesterol (Calc): 171 mg/dL (calc) — ABNORMAL HIGH (ref <130)
Total CHOL/HDL Ratio: 4.6 (calc) (ref <5.0)
Triglycerides: 180 mg/dL — ABNORMAL HIGH (ref <150)

## 2021-06-24 LAB — COMPLETE METABOLIC PANEL WITH GFR
AG Ratio: 1.2 (calc) (ref 1.0–2.5)
ALT: 18 U/L (ref 6–29)
AST: 21 U/L (ref 10–35)
Albumin: 3.6 g/dL (ref 3.6–5.1)
Alkaline phosphatase (APISO): 150 U/L (ref 37–153)
BUN: 11 mg/dL (ref 7–25)
CO2: 32 mmol/L (ref 20–32)
Calcium: 9.4 mg/dL (ref 8.6–10.4)
Chloride: 96 mmol/L — ABNORMAL LOW (ref 98–110)
Creat: 0.68 mg/dL (ref 0.60–1.00)
Globulin: 3.1 g/dL (calc) (ref 1.9–3.7)
Glucose, Bld: 73 mg/dL (ref 65–99)
Potassium: 4.5 mmol/L (ref 3.5–5.3)
Sodium: 133 mmol/L — ABNORMAL LOW (ref 135–146)
Total Bilirubin: 0.3 mg/dL (ref 0.2–1.2)
Total Protein: 6.7 g/dL (ref 6.1–8.1)
eGFR: 94 mL/min/{1.73_m2} (ref >=60)

## 2021-06-24 LAB — HEMOGLOBIN A1C
Hgb A1c MFr Bld: 7.2 % of total Hgb — ABNORMAL HIGH (ref <5.7)
Mean Plasma Glucose: 160 mg/dL
eAG (mmol/L): 8.9 mmol/L

## 2021-06-25 ENCOUNTER — Ambulatory Visit: Payer: PPO | Admitting: Adult Health

## 2021-06-25 DIAGNOSIS — J449 Chronic obstructive pulmonary disease, unspecified: Secondary | ICD-10-CM | POA: Diagnosis not present

## 2021-06-25 DIAGNOSIS — I10 Essential (primary) hypertension: Secondary | ICD-10-CM | POA: Diagnosis not present

## 2021-07-07 ENCOUNTER — Other Ambulatory Visit: Payer: Self-pay | Admitting: Nurse Practitioner

## 2021-07-18 DIAGNOSIS — S8002XA Contusion of left knee, initial encounter: Secondary | ICD-10-CM | POA: Diagnosis not present

## 2021-07-22 ENCOUNTER — Other Ambulatory Visit: Payer: Self-pay | Admitting: Adult Health

## 2021-07-22 ENCOUNTER — Other Ambulatory Visit: Payer: Self-pay | Admitting: Nurse Practitioner

## 2021-07-22 DIAGNOSIS — E1151 Type 2 diabetes mellitus with diabetic peripheral angiopathy without gangrene: Secondary | ICD-10-CM

## 2021-07-22 DIAGNOSIS — Z79899 Other long term (current) drug therapy: Secondary | ICD-10-CM

## 2021-07-29 ENCOUNTER — Other Ambulatory Visit: Payer: Self-pay

## 2021-07-29 ENCOUNTER — Ambulatory Visit (INDEPENDENT_AMBULATORY_CARE_PROVIDER_SITE_OTHER): Payer: PPO | Admitting: Adult Health

## 2021-07-29 ENCOUNTER — Encounter: Payer: Self-pay | Admitting: Adult Health

## 2021-07-29 VITALS — BP 118/60 | HR 78 | Temp 97.9°F | Wt 131.0 lb

## 2021-07-29 DIAGNOSIS — M25562 Pain in left knee: Secondary | ICD-10-CM

## 2021-07-29 MED ORDER — MELOXICAM 15 MG PO TABS: ORAL_TABLET | ORAL | 1 refills | Status: DC

## 2021-07-29 NOTE — Progress Notes (Signed)
Assessment and Plan:  Dhruvi was seen today for fall.  Diagnoses and all orders for this visit:  Acute pain of left knee Unclear mechanism of injury, patient unable to report Negative XR following fall per [facility] at [facility] She is able to weight bear but with significant pain Guarding on ROM exam though no clear laxity, popping, however consider medial collateral ligament strain, meniscal injury -  She is requesting referral for new ortho, does have chronic R knee pain at baseline as well. She is interested in [facility] or [facility] in town. Will request most recent report and refer as request.  Continue knee brace, rest, ice, elevation, compression Will add NSAID - meloxicam daily with food x 2 weeks, risks discussed, hold aspirin while taking this due to her concern with GI risks May need MRI but will defer to ortho pending NSAID trial after discussion -     Ambulatory referral to Orthopedics -     meloxicam (MOBIC) 15 MG tablet; Take one daily with food for 2 weeks, can take with tylenol, can not take with aleve, iburpofen, then as needed daily for pain   Further disposition pending results of labs. Discussed med's effects and SE's.   Over 15 minutes of exam, counseling, chart review, and critical decision making was performed.   Future Appointments  Date Time Provider [place]  09/29/2021  4:00 PM Magda Bernheim, NP [facility] None  10/28/2021  9:00 AM Newton [facility]igg[facility] None  03/18/2022  2:00 PM Liane Comber, NP [facility] None  06/23/2022  4:00 PM Magda Bernheim, NP [facility] None    ------------------------------------------------------------------------------------------------------------------   HPI BP 118/60    Pulse 78    Temp 97.9 F (36.6 C)    Wt 131 lb (59.4 kg)    SpO2 98%    BMI 27.38 kg/m  71 y.o.female presents for evaluation of acute R knee pain.   She ambulates with walker at baseline due to chronic pain following R hip ORIF in  2021, reports was up on the commode early in the AM 07/18/2021, not sure how she fell/mechanism of injury but ended up on the floor with acute medial and posterior left knee pain, also some lateral left hip pain (muscular/tenderness) was evaluated by UC the next day, reports knee xrays were "negative." Reports pain is worst in medial knee, some posterior, non-radicular, 2/10 at rest but 9/10 with any weight bearing. Denies clicking, popping, laxity or knee giving out since injury.   Reports has been taking 1000 mg TID tylenol, wrapping/knee sleeve, icing, elevating. She has not tried NSAIDs. Delines steroids due to T2DM and causes severe hyperglycemia. Overall sx stable, not improving or worsening.   She reports is est with Dr. Lorin Mercy' office following ORIF, does have appointment next week, but would prefer to be referred to a new more local office in town.   Past Medical History:  Diagnosis Date   Anxiety    Anxiety disorder    Cirrhosis ([place])    COPD (chronic obstructive pulmonary disease) ([place])    followed by pcp--  last exacerbation 11/ 2017   Coronary artery disease, non-occlusive 03/2018   Cardiac Cath: Moderate (60%) proximal-mid RCA (FFR 0.89 => NEGATIVE / not physiologically significant).  Normal EF and EDP.   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--  "gets real sharp  stabbing pain over eye and speech screws up"  pt states takes excederin migraine and lies down (pt stated was told if she "if come to ER again they would send her to psych ward")   Hyperlipidemia    Hypertension    OA (osteoarthritis)    back, hips, knees, feet   Osteoporosis    Polyp of colon, hyperplastic    Pulmonary nodules    per CT chest 08-28-2016  -- bilateral upper lobe nodules   S/P right hip fracture 09/06/2019   Stroke [facility])    Tubular adenoma of colon    Type 2 diabetes mellitus ([place])    followed by pcp--  last A1c 9.1 on  08-01-2016   UTI (urinary tract infection)    Wears dentures      Allergies  Allergen Reactions   Actos [Pioglitazone] Other (See Comments)    Patient stated that it elevated her BGL   Glyburide Other (See Comments)    Patient stated that it elevated her BGL   Metformin And Related     "metformin caused mini-strokes" and ELEVATED BS and also GLYBURIDE, ONGLYZA, ACTOS, INVOKANA caused elevated BS   Onglyza [Saxagliptin] Other (See Comments)    Patient stated that it elevated her BGL   Rosuvastatin Diarrhea   Insulins Rash   Janumet [Sitagliptin-Metformin Hcl] Rash and Other (See Comments)    Shakes/ rash   Penicillins Rash and Other (See Comments)    Current Outpatient Medications on File Prior to Visit  Medication Sig   acetaminophen (TYLENOL) 500 MG tablet Take 500 mg by mouth as directed. Take 2 tablets three times ad ay as needed   ALPRAZolam (XANAX) 1 MG tablet Take 1/2 to 1 tablet by mouth two to three times daily ONLY if needed for Anxiety Attack. limit to 5 days/week to avoid Addiction and Dementia.   aspirin EC 81 MG tablet Take 81 mg by mouth daily.   doxycycline (VIBRAMYCIN) 100 MG capsule Take 1 capsule 2 x/day with food until sores are resolved.   DULoxetine (CYMBALTA) 20 MG capsule Take 1 capsule (20 mg total) by mouth daily.   gabapentin (NEURONTIN) 100 MG capsule Take 1 capsule (100 mg total) by mouth 3 (three) times daily.   glimepiride (AMARYL) 4 MG tablet TAKE ONE TABLET BY MOUTH TWICE DAILY with a meal FOR DIABETES   Insulin Pen Needle (CAREFINE PEN NEEDLES) 32G X 4 MM MISC Use 1x a day   LANTUS SOLOSTAR 100 UNIT/ML Solostar Pen Inject 20 to 30 Units into the skin daily. (Patient taking differently: Inject 23 Units into the skin at bedtime.)   lisinopril (ZESTRIL) 5 MG tablet Take 1 tablet at night for BP and diabetic kidney protection. (Patient taking differently: 2.5 mg. Take 1 tablet at night for BP and diabetic kidney protection.)   propranolol (INDERAL) 20 MG  tablet TAKE 1/2 TO 1 TABLET BY MOUTH TWICE DAILY AS NEEDED FOR tremors /shakes   No current facility-administered medications on file prior to visit.    ROS: all negative except above.   Physical Exam:  BP 118/60    Pulse 78    Temp 97.9 F (36.6 C)    Wt 131 lb (59.4 kg)    SpO2 98%    BMI 27.38 kg/m   General Appearance: Well nourished, well dressed elder female in no acute distress. Eyes: PERRL, conjunctiva no swelling or erythema ENT/Mouth: Mask in place Neck: Supple, thyroid normal.  Respiratory: Respiratory effort normal, BS equal bilaterally without rales,  rhonchi, wheezing or stridor.  Cardio: RRR with no MRGs. Brisk peripheral pulses, 2+ edema bil ankles Lymphatics: Non tender without lymphadenopathy.  Musculoskeletal: Bil knees with reduced ROM, guarding; left knee with medial ligament area tenderness, mildly tender over joint line. No effusion. No clicking/popping or clear laxity on exam though limited by pain and guarding. She is able to bear weight. Very slow gait, small steps with rolling walker.  Skin: Warm, dry without rashes, lesions, ecchymosis.  Neuro: Distal sensation intact.  Psych: Awake and oriented X 3, normal affect, Insight and Judgment appropriate.    Izora Ribas, NP 4:08 PM [facility]

## 2021-08-01 DIAGNOSIS — M25562 Pain in left knee: Secondary | ICD-10-CM | POA: Diagnosis not present

## 2021-08-11 ENCOUNTER — Other Ambulatory Visit: Payer: Self-pay | Admitting: Nurse Practitioner

## 2021-09-01 ENCOUNTER — Other Ambulatory Visit: Payer: Self-pay | Admitting: Internal Medicine

## 2021-09-01 ENCOUNTER — Telehealth: Payer: Self-pay

## 2021-09-01 NOTE — Telephone Encounter (Signed)
Called patient to complete an anxiety review call. Unable to reach patient. [place].  Total time spent: [place], [facility]

## 2021-09-09 ENCOUNTER — Telehealth: Payer: Self-pay

## 2021-09-09 NOTE — Telephone Encounter (Signed)
Called patient for anxiety review call. Was unable to reach patient X2.   Total time spent: 2 Minutes Vanetta Shawl, [place]

## 2021-09-10 ENCOUNTER — Other Ambulatory Visit: Payer: Self-pay | Admitting: Nurse Practitioner

## 2021-09-10 ENCOUNTER — Other Ambulatory Visit: Payer: Self-pay | Admitting: Adult Health

## 2021-09-10 DIAGNOSIS — I10 Essential (primary) hypertension: Secondary | ICD-10-CM

## 2021-09-12 ENCOUNTER — Telehealth: Payer: Self-pay

## 2021-09-12 NOTE — Telephone Encounter (Signed)
Called patient to complete GAD-7. 3rd attempt at contacting patient. Unable to reach patient.  Total time spent: 2 Minutes Vanetta Shawl, [facility]

## 2021-09-24 NOTE — Progress Notes (Signed)
3 MONTH FOLLOW UP  Assessment:    Atherosclerosis of abdominal aorta (HCC) Control blood pressure, cholesterol, glucose, increase exercise.   CAD STOP SMOKING Follows with Cardiology, Dr Ellyn Hack. Control blood pressure, cholesterol, glucose, increase exercise.   Essential hypertension Monitor blood pressure at home; call if consistently over 130/80 Continue DASH diet.   Reminder to go to the ER if any CP, SOB, nausea, dizziness, severe HA, changes vision/speech, left arm numbness and tingling and jaw pain. 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 If hyperglycemia persists follow up in office with log Denies any barriers. Discussed dietary and exercise modifications. Schedule diabetes eye exam  -     glimepiride (AMARYL) 4 MG tablet; TAKE ONE TABLET BY MOUTH TWICE DAILY BEFORE LUNCH AND SUPPER -     Insulin Glargine (LANTUS SOLOSTAR) 100 UNIT/ML Solostar Pen; Inject 20-30 Units into the skin at bedtime.  -     Hemoglobin A1c  Liver cirrhosis secondary to NASH [facility]) Follows with GI for this. -     COMPLETE METABOLIC PANEL WITH GFR  Chronic obstructive pulmonary disease, unspecified COPD type ([place]) Denies sx; STOP smoking; monitor   Hyperlipidemia associated with type 2 diabetes mellitus (HCC)/Statin myopathy Statin for LDL goal <70 Discussed dietary and exercise modifications. -     Lipid panel  Tobacco use disorder Discussed smoking cessation.  Has reduced quantity.  Has been using gum to help.  Reports she has tried chantix, not ready to quit. Will continue to assess readiness. -lung cancer screening 06/2020, refuses another  Chronic hyponatremia Monitor;   -      COMPLETE METABOLIC PANEL WITH GFR  Anxiety Continue Cymbalta daily and Xanax 1 mg PRN,  Stress management techniques discussed, increase water, good sleep hygiene discussed, increase exercise, and increase veggies.    Vitamin D deficiency -    Defer, off of supplement; restart taking  Poor compliance with medication Discussed this at length with patient.   Tremor Continue Propranolol daily and Xanax PRN Strongly encouraged to stop smoking   Over 40 minutes of exam, counseling, chart review and critical decision making was performed Future Appointments  Date Time Provider [place]  10/28/2021  9:00 AM Newton Pigg, [facility] None  03/18/2022  2:00 PM Liane Comber, NP [facility] None  06/23/2022  4:00 PM [name] Townsend Roger, NP [facility] None    Subjective:  Sara Lamb is a 71 y.o. MWF female who presents for 3 month follow up.  She has HTN, HLD, DMII with AA, NASH, Osteoporosis, Tobacco use, Vit D Defciency, COPD, chronic hyponatremia, anxiety and poor medication compliance.  She is very noncompliant with test, vaccines, and has an aversion to medication with " reactions" to the majority of medications.   She is s/p fall in Jan 2021 and fracture to her hip, continues to use walker, 1 step at home, can manage with handrail. Has handicap shower installed with rail. Denies falls since Jan. Completed PT. Follows with [facility], taking tylenol, gabapentin.   She is currently on Cymbalta for anxiety, depression and pain. She reports trazodone made her more irritated/kept her awake, endorses severe swelling with lexapro (reports attempted  twice with similar results), has taken celexa, wellbutrin  She is noticing a worsening of tremors, currently on propranolol 20 mg BID. States the propranolol never really controlled her symptoms. She has noticed some worsening with the tremors when her blood sugars drop down below 80.  Also noted some numbness in right hand at that  time  She has cirrhosis due to NASH, following with GI Dr. Havery Moros, EGD 01/2017 showed no varices.   She has COPD by imaging, denies dyspnea or secretions, continues to smoke, reports has reduced from 2-2.5 to 1.5 packs recently. Last CT chest 06/2020 showed small nodules ~3.3 mm recommended 12 month follow up, emphysematous changes and aortic atherosclerosis.   BMI is Body mass index is 27.84 kg/m., she has not been working on diet, reports does do daily stretch and strengthening exercises.  Wt Readings from Last 3 Encounters:  09/29/21 133 lb 3.2 oz (60.4 kg)  07/29/21 131 lb (59.4 kg)  06/23/21 134 lb 3.2 oz (60.9 kg)   Had cath 2019 showed Prox RCA to Mid RCA lesion is 60% stenosed. FFR negative- following with Dr. Ellyn Hack.  Had low risk perfusion test 06/2020.    Marland Kitchen Her blood pressure has been controlled at home, today their BP is BP: 128/60  BP Readings from Last 3 Encounters:  09/29/21 128/60  07/29/21 118/60  06/23/21 (!) 148/70    She does not workout. She denies chest pain, shortness of breath, dizziness.   She has aortic atherosclerosis per CT 12/2016 She is not on cholesterol medication (prescribed rosuvastatin 20 mg MWF but admits not taking, is willing to restart) and denies myalgias. Her cholesterol is not at goal. The cholesterol last visit was:   Lab Results  Component Value Date   CHOL 219 (H) 06/23/2021   HDL 48 (L) 06/23/2021   LDLCALC 139 (H) 06/23/2021   TRIG 180 (H) 06/23/2021   CHOLHDL 4.6 06/23/2021   She has had diabetes for 20 years. She has been working on diet and exercise for diabetes, and denies hypoglycemia , nausea, paresthesia of the feet, polydipsia, polyuria, visual disturbances, vomiting and weight loss.  Hx of intolerance with metformin, actos, januvia, onglyza.  She is prescribed glimepiride 4 mg BID, lantus 23 units PM.  Fasting 91-130, most values are in low 100's. She is working on her diet, eating more salads with cheese, cucumber,  tomatoes, [place] and boiled egg. Lab Results  Component Value Date   HGBA1C 7.2 (H) 06/23/2021   Last GFR: Lab Results  Component Value Date   GFRNONAA 68 12/03/2020   Patient is on Vitamin D supplement, was taking 1000 units three days a week, GI upset with higher doses, stopped taking recently  Lab Results  Component Value Date   VD25OH 13 (L) 03/17/2021      Medication Review: Current Outpatient Medications on File Prior to Visit  Medication Sig Dispense Refill   acetaminophen (TYLENOL) 500 MG tablet Take 500 mg by mouth as directed. Take 2 tablets three times ad ay as needed     ALPRAZolam (XANAX) 1 MG tablet Take 1/2 to 1 tablet by mouth two times daily ONLY if needed for Anxiety Attack. limit to 5 days/week to avoid Addiction and Dementia. 50 tablet 0   aspirin EC 81 MG tablet Take 81 mg by mouth daily.     DULoxetine (CYMBALTA) 20 MG capsule Take 1 capsule (20 mg total) by mouth daily. 90 capsule 1   gabapentin (NEURONTIN) 100 MG capsule TAKE ONE CAPSULE  BY MOUTH THREE TIMES DAILY 270 capsule 1   glimepiride (AMARYL) 4 MG tablet TAKE ONE TABLET BY MOUTH TWICE DAILY with a meal FOR DIABETES 180 tablet 0   LANTUS SOLOSTAR 100 UNIT/ML Solostar Pen Inject 20 to 30 Units into the skin daily. (Patient taking differently: Inject 23 Units into the skin at bedtime.) 15 mL 1   lisinopril (ZESTRIL) 2.5 MG tablet TAKE ONE TABLET BY MOUTH NIGHTLY FOR BLOOD PRESSURE 180 tablet 0   meloxicam (MOBIC) 15 MG tablet Take one daily with food for 2 weeks, can take with tylenol, can not take with aleve, iburpofen, then as needed daily for pain 30 tablet 1   propranolol (INDERAL) 20 MG tablet TAKE 1/2 TO 1 TABLET BY MOUTH TWICE DAILY AS NEEDED FOR tremors /shakes 60 tablet 0   doxycycline (VIBRAMYCIN) 100 MG capsule Take 1 capsule 2 x/day with food until sores are resolved. (Patient not taking: Reported on 09/29/2021) 30 capsule 0   Insulin Pen Needle (CAREFINE PEN NEEDLES) 32G X 4 MM MISC Use 1x a day  100 each 3   No current facility-administered medications on file prior to visit.    Allergies  Allergen Reactions   Actos [Pioglitazone] Other (See Comments)    Patient stated that it elevated her BGL   Glyburide Other (See Comments)    Patient stated that it elevated her BGL   Metformin And Related     "metformin caused mini-strokes" and ELEVATED BS and also GLYBURIDE, ONGLYZA, ACTOS, INVOKANA caused elevated BS   Onglyza [Saxagliptin] Other (See Comments)    Patient stated that it elevated her BGL   Rosuvastatin Diarrhea   Insulins Rash   Janumet [Sitagliptin-Metformin Hcl] Rash and Other (See Comments)    Shakes/ rash   Penicillins Rash and Other (See Comments)    Current Problems (verified) Patient Active Problem List   Diagnosis Date Noted   Rash and other nonspecific skin eruption 02/12/2021   CKD stage 2 due to type 2 diabetes mellitus ([place]) 02/11/2021   Overweight (BMI 25.0-29.9) 10/11/2020   Chest pain with low risk for cardiac etiology 06/24/2020   Type 2 diabetes mellitus with circulatory disorder ([place]) 11/24/2018   Chronic hyponatremia 03/18/2018   Coronary artery disease, non-occlusive: RCA 60%, FFR Negative. 02/16/2018   Cognitive dysfunction 03/27/2017   Liver cirrhosis secondary to NASH ([place]) 11/23/2016   Osteoporosis 05/27/2016   Atherosclerosis of abdominal aorta ([place]) - per CT 12/2016 04/30/2016   COPD (chronic obstructive pulmonary disease) ([place]) 04/30/2015   Tobacco use disorder 04/08/2015   Poor compliance with medication 11/05/2014   Vitamin D deficiency    Anxiety    Essential hypertension    Hyperlipidemia associated with type 2 diabetes mellitus ([place])     SURGICAL HISTORY She  has a past surgical history that includes Vaginal hysterectomy (age 74); Bilateral salpingoophorectomy (1997); Knee arthroscopy (Right, 11/2014); Cholecystectomy (N/A, 09/30/2016); LEFT HEART CATH AND CORONARY ANGIOGRAPHY (N/A, 04/12/2018); INTRAVASCULAR PRESSURE WIRE/FFR  STUDY (N/A, 04/12/2018); transthoracic echocardiogram (02/2018); NM MYOVIEW LTD (07/03/2020); and ORIF hip fracture (Right, 2021). FAMILY HISTORY Her family history includes Breast cancer (age of onset: 71) in her sister; CAD in her maternal aunt, maternal uncle, and sister; CAD (age of onset: 74) in her mother; Cancer in her father; Coronary artery disease (age of onset: 64) in her brother; Diabetes in her brother, father, and mother; Heart attack in her maternal grandmother; Heart attack (age of onset: 56) in her sister; Hyperlipidemia in her brother, sister, and sister; Hypertension  in her brother, father, and sister; Kidney Stones in her son; Stomach cancer in her mother; Stroke in her mother.  SOCIAL HISTORY She  reports that she has been smoking cigarettes. She started smoking about 54 years ago. She has a 106.00 pack-year smoking history. She has never used smokeless tobacco. She reports that she does not drink alcohol and does not use drugs.   Review of Systems  Constitutional:  Negative for malaise/fatigue and weight loss.  HENT:  Negative for hearing loss and tinnitus.   Eyes:  Negative for blurred vision and double vision.  Respiratory:  Positive for cough. Negative for sputum production, shortness of breath and wheezing.   Cardiovascular:  Negative for chest pain, palpitations, orthopnea, claudication, leg swelling and PND.  Gastrointestinal:  Negative for abdominal pain, blood in stool, constipation, diarrhea, heartburn, melena, nausea and vomiting.  Genitourinary: Negative.   Musculoskeletal:  Positive for joint pain (R hip and knees). Negative for falls and myalgias.  Skin:  Negative for rash.  Neurological:  Negative for dizziness, tingling, sensory change, weakness and headaches.  Endo/Heme/Allergies:  Negative for polydipsia.  Psychiatric/Behavioral:  Negative for depression, memory loss, substance abuse and suicidal ideas. The patient is nervous/anxious. The patient does not  have insomnia.   All other systems reviewed and are negative.   Objective:     Today's Vitals   09/29/21 1550  BP: 128/60  Pulse: 74  Temp: 97.7 F (36.5 C)  SpO2: 97%  Weight: 133 lb 3.2 oz (60.4 kg)   Body mass index is 27.84 kg/m.  General appearance: alert, no distress, WD/WN, female HEENT: normocephalic, sclerae anicteric, TMs pearly, nares patent, no discharge or erythema, pharynx normal Oral cavity: MMM, no lesions Neck: supple, no lymphadenopathy, no thyromegaly, no masses Heart: RRR, normal S1, S2, murmurs Lungs: CTA bilaterally, no wheezes, rhonchi, or rales Abdomen: +bs, soft, non tender, non distended, no masses, no hepatomegaly, no splenomegaly Musculoskeletal: no swelling, no obvious deformity, tender to light palpation to left lumbar. Extremities:1+ pitting edema of LE, no cyanosis Skin: Warm and dry, multiple ecchymoses on arms and legs Pulses: 2+ symmetric, upper and lower extremities, normal cap refill Neurological: alert, oriented x 3, CN2-12 intact, strength normal upper extremities and lower extremities, sensation normal throughout, DTRs 2+ throughout, gait slow steady with rolling walker Psychiatric: normal affect, behavior normal, pleasant     Magda Bernheim, NP   09/29/2021

## 2021-09-29 ENCOUNTER — Ambulatory Visit (INDEPENDENT_AMBULATORY_CARE_PROVIDER_SITE_OTHER): Payer: PPO | Admitting: Nurse Practitioner

## 2021-09-29 ENCOUNTER — Encounter: Payer: Self-pay | Admitting: Nurse Practitioner

## 2021-09-29 ENCOUNTER — Other Ambulatory Visit: Payer: Self-pay

## 2021-09-29 VITALS — BP 128/60 | HR 74 | Temp 97.7°F | Wt 133.2 lb

## 2021-09-29 DIAGNOSIS — R251 Tremor, unspecified: Secondary | ICD-10-CM

## 2021-09-29 DIAGNOSIS — J449 Chronic obstructive pulmonary disease, unspecified: Secondary | ICD-10-CM | POA: Diagnosis not present

## 2021-09-29 DIAGNOSIS — K7469 Other cirrhosis of liver: Secondary | ICD-10-CM

## 2021-09-29 DIAGNOSIS — F419 Anxiety disorder, unspecified: Secondary | ICD-10-CM

## 2021-09-29 DIAGNOSIS — E559 Vitamin D deficiency, unspecified: Secondary | ICD-10-CM | POA: Diagnosis not present

## 2021-09-29 DIAGNOSIS — F17200 Nicotine dependence, unspecified, uncomplicated: Secondary | ICD-10-CM | POA: Diagnosis not present

## 2021-09-29 DIAGNOSIS — K7581 Nonalcoholic steatohepatitis (NASH): Secondary | ICD-10-CM

## 2021-09-29 DIAGNOSIS — I700 Atherosclerosis of aorta: Secondary | ICD-10-CM

## 2021-09-29 DIAGNOSIS — I2510 Atherosclerotic heart disease of native coronary artery without angina pectoris: Secondary | ICD-10-CM

## 2021-09-29 DIAGNOSIS — Z789 Other specified health status: Secondary | ICD-10-CM

## 2021-09-29 DIAGNOSIS — I10 Essential (primary) hypertension: Secondary | ICD-10-CM | POA: Diagnosis not present

## 2021-09-29 DIAGNOSIS — K7460 Unspecified cirrhosis of liver: Secondary | ICD-10-CM

## 2021-09-29 DIAGNOSIS — E871 Hypo-osmolality and hyponatremia: Secondary | ICD-10-CM | POA: Diagnosis not present

## 2021-09-29 DIAGNOSIS — E785 Hyperlipidemia, unspecified: Secondary | ICD-10-CM

## 2021-09-29 DIAGNOSIS — E1169 Type 2 diabetes mellitus with other specified complication: Secondary | ICD-10-CM

## 2021-09-29 DIAGNOSIS — E1151 Type 2 diabetes mellitus with diabetic peripheral angiopathy without gangrene: Secondary | ICD-10-CM | POA: Diagnosis not present

## 2021-09-29 DIAGNOSIS — Z9114 Patient's other noncompliance with medication regimen: Secondary | ICD-10-CM

## 2021-09-29 DIAGNOSIS — Z91148 Patient's other noncompliance with medication regimen for other reason: Secondary | ICD-10-CM

## 2021-09-29 DIAGNOSIS — E1159 Type 2 diabetes mellitus with other circulatory complications: Secondary | ICD-10-CM

## 2021-09-29 MED ORDER — LANTUS SOLOSTAR 100 UNIT/ML ~~LOC~~ SOPN: 23.0000 [IU] | PEN_INJECTOR | Freq: Every day | SUBCUTANEOUS | 1 refills | Status: DC

## 2021-09-29 NOTE — Patient Instructions (Signed)

## 2021-09-30 LAB — COMPLETE METABOLIC PANEL WITH GFR
AG Ratio: 1.1 (calc) (ref 1.0–2.5)
ALT: 15 U/L (ref 6–29)
AST: 19 U/L (ref 10–35)
Albumin: 3.6 g/dL (ref 3.6–5.1)
Alkaline phosphatase (APISO): 147 U/L (ref 37–153)
BUN: 18 mg/dL (ref 7–25)
CO2: 28 mmol/L (ref 20–32)
Calcium: 9.8 mg/dL (ref 8.6–10.4)
Chloride: 94 mmol/L — ABNORMAL LOW (ref 98–110)
Creat: 0.81 mg/dL (ref 0.60–1.00)
Globulin: 3.2 g/dL (calc) (ref 1.9–3.7)
Glucose, Bld: 67 mg/dL (ref 65–99)
Potassium: 5.3 mmol/L (ref 3.5–5.3)
Sodium: 130 mmol/L — ABNORMAL LOW (ref 135–146)
Total Bilirubin: 0.4 mg/dL (ref 0.2–1.2)
Total Protein: 6.8 g/dL (ref 6.1–8.1)
eGFR: 78 mL/min/{1.73_m2} (ref >=60)

## 2021-09-30 LAB — CBC WITH DIFFERENTIAL/PLATELET
Absolute Monocytes: 859 cells/uL (ref 200–950)
Basophils Absolute: 121 cells/uL (ref 0–200)
Basophils Relative: 1 %
Eosinophils Absolute: 968 cells/uL — ABNORMAL HIGH (ref 15–500)
Eosinophils Relative: 8 %
HCT: 41.7 % (ref 35.0–45.0)
Hemoglobin: 14 g/dL (ref 11.7–15.5)
Lymphs Abs: 2856 cells/uL (ref 850–3900)
MCH: 31.1 pg (ref 27.0–33.0)
MCHC: 33.6 g/dL (ref 32.0–36.0)
MCV: 92.7 fL (ref 80.0–100.0)
MPV: 10.2 fL (ref 7.5–12.5)
Monocytes Relative: 7.1 %
Neutro Abs: 7296 cells/uL (ref 1500–7800)
Neutrophils Relative %: 60.3 %
Platelets: 278 10*3/uL (ref 140–400)
RBC: 4.5 10*6/uL (ref 3.80–5.10)
RDW: 11.6 % (ref 11.0–15.0)
Total Lymphocyte: 23.6 %
WBC: 12.1 10*3/uL — ABNORMAL HIGH (ref 3.8–10.8)

## 2021-09-30 LAB — HEMOGLOBIN A1C
Hgb A1c MFr Bld: 7.2 % of total Hgb — ABNORMAL HIGH (ref <5.7)
Mean Plasma Glucose: 160 mg/dL
eAG (mmol/L): 8.9 mmol/L

## 2021-09-30 LAB — LIPID PANEL
Cholesterol: 212 mg/dL — ABNORMAL HIGH (ref <200)
HDL: 49 mg/dL — ABNORMAL LOW (ref >=50)
LDL Cholesterol (Calc): 128 mg/dL (calc) — ABNORMAL HIGH
Non-HDL Cholesterol (Calc): 163 mg/dL (calc) — ABNORMAL HIGH (ref <130)
Total CHOL/HDL Ratio: 4.3 (calc) (ref <5.0)
Triglycerides: 210 mg/dL — ABNORMAL HIGH (ref <150)

## 2021-09-30 LAB — TSH: TSH: 1.39 mIU/L (ref 0.40–4.50)

## 2021-10-06 ENCOUNTER — Other Ambulatory Visit: Payer: Self-pay | Admitting: Adult Health

## 2021-10-06 ENCOUNTER — Other Ambulatory Visit: Payer: Self-pay | Admitting: Nurse Practitioner

## 2021-10-06 DIAGNOSIS — E1151 Type 2 diabetes mellitus with diabetic peripheral angiopathy without gangrene: Secondary | ICD-10-CM

## 2021-10-06 DIAGNOSIS — I700 Atherosclerosis of aorta: Secondary | ICD-10-CM

## 2021-10-06 DIAGNOSIS — Z79899 Other long term (current) drug therapy: Secondary | ICD-10-CM

## 2021-10-28 ENCOUNTER — Ambulatory Visit: Payer: PPO | Admitting: Pharmacy Technician

## 2021-10-28 DIAGNOSIS — E663 Overweight: Secondary | ICD-10-CM

## 2021-10-28 DIAGNOSIS — F17200 Nicotine dependence, unspecified, uncomplicated: Secondary | ICD-10-CM

## 2021-10-28 DIAGNOSIS — F419 Anxiety disorder, unspecified: Secondary | ICD-10-CM

## 2021-10-28 DIAGNOSIS — Z794 Long term (current) use of insulin: Secondary | ICD-10-CM

## 2021-10-28 DIAGNOSIS — E1169 Type 2 diabetes mellitus with other specified complication: Secondary | ICD-10-CM

## 2021-10-28 DIAGNOSIS — Z91148 Patient's other noncompliance with medication regimen for other reason: Secondary | ICD-10-CM

## 2021-10-28 DIAGNOSIS — I10 Essential (primary) hypertension: Secondary | ICD-10-CM

## 2021-10-30 ENCOUNTER — Other Ambulatory Visit: Payer: Self-pay | Admitting: Internal Medicine

## 2021-10-30 DIAGNOSIS — G250 Essential tremor: Secondary | ICD-10-CM

## 2021-10-30 MED ORDER — DIVALPROEX SODIUM 250 MG PO DR TAB: DELAYED_RELEASE_TABLET | ORAL | 1 refills | Status: DC

## 2021-10-30 MED ORDER — HYDROXYZINE HCL 25 MG PO TABS: ORAL_TABLET | ORAL | 0 refills | Status: DC

## 2021-10-31 NOTE — Progress Notes (Signed)
Follow Up Pharmacist Visit  ? ?Sara Lamb,Sara Lamb [id number] ?79 years, Female  DOB: Jun 10, 1951  M: [phone] ?Care Team: [name]  Strategy, Team ?__________________________________________________ ?Patient's Chronic Conditions: Hypertension (HTN), Chronic Obstructive Pulmonary Disease (COPD), Hyperlipidemia/Dyslipidemia (HLD), Diabetes (DM), Chronic Kidney Disease (CKD), Osteopenia or Osteoporosis, Anxiety, Cardiovascular Disease (CVD), Other ?List Other Conditions (separated by comma): Vitamin D deficiency, Chronic hyponatremia, Atherosclerosis of abdominal aorta, CAD, Liver cirrhosis, Cognitive disfunction, Overweight, Poor compliance w/ medication, Tobacco use disorder  ? ?Summary for PCP:  ?1. Not checking BP at home. No dizziness, fainting, headaches to note. ?2. A1c now 7.2%. On Amaryl 39m BID and Lantus 23u daily. Checking BG twice daily but unable to give readings at visit. ?3. Patient taking Xanax 144m1.5 tabs daily, 7 days a week and asking for more today. Consider Buspar 27m40mID titrated to 80m73mD to decrease Xanax use. GAD-7 today is 5. ?4. Not ready to quit smoking. ?5. Declines Mammogram and colonoscopy. May consider Cologuard. ?6. Needs documented eye exam. ?7. Patient may be interested in neurology referral for "shakes". ?. ?Doctor and Hospital Visits ?Were there PCP Visits since last visit with the Pharmacist?: Yes ?Visit #1: 06/23/21-Dana MullDemetra Shiner- Pt. presented for Medicare AWV and 3 month f/u. F/U in 3 months ?STOPPED ? ?Furosemide 40 MG Take 1/2 to 1 tablet Daily if needed of Fluid Retention / leg/ankle swelling ?Pravastatin Sodium 20 MG Take 1 tab at night for cholesterol. ? ?Visit #2: 07/29/21-[name], NP- Pt. presented for a fall w/ c/o left knee pain & left hip pain. ?STARTED ? ?Meloxicam 15 MG Take one daily with food for 2 weeks, can take with tylenol, can not take with aleve, iburpofen, then as needed daily for pain ? ?Visit #3: 09/29/21-Dana MullDemetra Shiner- Pt.  presented for 3 month f/u visit. ?MODIFIED ? ?Insulin Glargine 23 Units Subcutaneous Daily at bedtime Inject 20 to 30 Units into the skin daily. ? ?STOPPED ? ?Doxycycline Hyclate 100 MG Take 1 capsule 2 x/day with food until sores are resolved. ? ?Were there Specialist Visits since last visit with the Pharmacist?: Yes ?Visit #1: 05/27/21-Mara KeitLanny Hurst- No information available. ?Visit #2: 07/18/21-[name][facility] No information available. ? ?Was there a Hospital Visit in last 30 days?: No ?Were there other Hospital Visits since last visit with the Pharmacist?: No ?. ?Medication Information ?Have there been any medication changes from PCP or Specialist since last visit with the Pharmacist?: Yes ?Details: STOPPED ? ?Furosemide 40 MG Take 1/2 to 1 tablet Daily if needed of Fluid Retention / leg/ankle swelling ?Pravastatin Sodium 20 MG Take 1 tab at night for cholesterol. ? ?Visit #2: ?07/29/21-[name], NP- Pt. presented for a fall w/ c/o left knee pain & left hip pain. ?STARTED ? ?Meloxicam 15 MG Take one daily with food for 2 weeks, can take with tylenol, can not take with aleve, iburpofen, then as needed daily for pain ? ?Visit #3: ?09/29/21-Dana MullDemetra Shiner- Pt. presented for 3 month f/u visit. ?MODIFIED ? ?Insulin Glargine 23 Units Subcutaneous Daily at bedtime Inject 20 to 30 Units into the skin daily. ? ?STOPPED ? ?Doxycycline Hyclate 100 MG Take 1 capsule 2 x/day with food until sores are resolved. ? ?Are there any Medication adherence gaps (beyond 5 days past due)?: Yes ?Details:  ?Duloxetine 20mg11m21/22 & 06/04/21 (>5 days gap) 90DS by CarolTierra Verdepranolol 20mg-427m18/22 & 07/07/21 (>5 days gap) 30DS by CaroliLaportedication adherence rates for the [facility]  rating drugs:  ?Lisinopril 2.36m- 09/10/21 90DS ?Glimepiride 423m 07/22/21 90DS ?Lantus U-100- 09/29/21 65DS ? ?List Patient's current Care Gaps: Need Eye Exam Documented in EHR or by Claim, Need Breast Cancer  Screening ?. Marland KitchenPre-Call Questions (HC) ?Are you able to connect with Patient: Yes ?Confirmed appointment date/time with patient/caregiver?: Yes ?Date/time of the appointment: 10/28/21 at 9:00AM ?Visit type: Phone ?Patient/Caregiver instructed to bring medications to appointment: Yes ?What, if any, problems do you have getting your medications from the pharmacy?: None ?What is your top health concern to discuss at your upcoming visit?:  ?Pt. expressed concerns that her medicine Duloxetine for "shakes" is not helping. Pt. also wants to increase Xanax dosage. Current dose is 1/2 to 1 BID daily PRN. LFD was 10/09/21 per pt. EMR says 09/11/21 was LFD w/ 30DS  Pt. stated she is about to run out of pills and has 12 left. Pt. stated "I think my body is immune to it, and I need to increase it." Asked pt. if she only takes the Xanax for panic attacks Pt. stated she takes 1 in the afternoon and 1 at bedtime every day. Per sig on EPIC, pt. is only supposed to take PRN no more than 5 days weekly. ? ?Have you seen any other providers since your last visit?: Yes ?Details: 07/18/22-Dr. MuPercell Millerortho)- visit for fall ?08/01/21-Dr. Percell Millerortho)- f/u visit ?. Marland KitchenSubjective Information ?Current BP: 118/60 ?Current HR: 78 ?taken on: 07/29/2021 ?Weight: 131 ?BMI: 27.38 ?Last GFR: 78 ?taken on: 09/29/2021 ?Visit Completed on: 10/28/2021 ?Why did the patient present?: CCM Follow up ?Lifestyle habits such as diet and exercise?: No exercise ?Alcohol, tobacco, and illicit drug usage?: Current smoker ?What is the patient?s sleep pattern?: No sleep issues ?How many hours per night does patient typically sleep?: 12+ ?Factors that may affect medication adherence?: Low motivation, Medication side effects, Low literacy / Education level ?Is Patient using [facility]?: No ?[facility] ?Current Rx insurance plan: HTA ?Would patient benefit from direct intervention of clinical lead in dispensing process to  optimize clinical outcomes?: No ?Are [facility] pharmacy services available where patient lives?: No ?Does patient experience delays in picking up medications due to transportation concerns (getting to pharmacy)?: No ?Any additional demeanor/mood notes?: Very nice, talkative patient presenting for CCM follow up. Main concerns were worsening shakes and Xanax use. ?SDOH: Accountable Health Communities Health-Related Social Needs Screening Tool ?([email or web address]?SDOH questions were documented and reviewed (EMR or [employer]) within the past 3 months?: No ?What is your living situation today? (ref #1): I have a steady place to live ?Think about the place you live. Do you have problems with any of the following? (ref #2): None of the above ?Within the past 12 months, you worried that your food would run out before you got money to buy more (ref #3): Never true ?Within the past 12 months, the food you bought just didn't last and you didn't have money to get more (ref #4): Never true ?In the past 12 months, has lack of reliable transportation kept you from medical appointments, meetings, work or from getting things needed for daily living? (ref #5): No ?In the past 12 months, has the electric, gas, oil, or water company threatened to shut off services in your home? (ref #6): No ?How often does anyone, including family and friends, physically hurt you? (ref #7): Never (1) ?How often does anyone, including family and friends, insult or talk down to you? (ref #8): Never (1) ?How often  does anyone, including friends and family, threaten you with harm? (ref #9): Never (1) ?How often does anyone, including family and friends, scream or curse at you? (ref #10): Never (1) ?. ?Hypertension (HTN) ?Discussed with patient today?: No ?Drug: Lisinopril mg QD ?Assessment: Appropriate, Effective, Safe, Accessible ?Marland Kitchen ?Hyperlipidemia/Dyslipidemia (HLD) ?Last Lipid panel on:  09/29/2021 ?TC (Goal<200): 212 ?LDL: 128 ?HDL (Goal>40): 49 ?TG (Goal<150): 210 ?ASCVD 10-year risk?is:: High (>20%) ?ASCVD Risk Score: 31.6% ?Discussed with patient today?: Yes ?LDL Goal: <70 ?Has patient tried and failed an

## 2021-11-23 DIAGNOSIS — J449 Chronic obstructive pulmonary disease, unspecified: Secondary | ICD-10-CM | POA: Diagnosis not present

## 2021-11-23 DIAGNOSIS — I10 Essential (primary) hypertension: Secondary | ICD-10-CM | POA: Diagnosis not present

## 2021-12-05 ENCOUNTER — Other Ambulatory Visit: Payer: Self-pay | Admitting: Adult Health

## 2021-12-12 ENCOUNTER — Other Ambulatory Visit: Payer: Self-pay | Admitting: Nurse Practitioner

## 2021-12-12 DIAGNOSIS — E1151 Type 2 diabetes mellitus with diabetic peripheral angiopathy without gangrene: Secondary | ICD-10-CM

## 2021-12-17 ENCOUNTER — Other Ambulatory Visit: Payer: Self-pay | Admitting: Nurse Practitioner

## 2021-12-17 DIAGNOSIS — Z79899 Other long term (current) drug therapy: Secondary | ICD-10-CM

## 2021-12-17 DIAGNOSIS — E1151 Type 2 diabetes mellitus with diabetic peripheral angiopathy without gangrene: Secondary | ICD-10-CM

## 2022-01-01 ENCOUNTER — Ambulatory Visit: Payer: PPO | Admitting: Internal Medicine

## 2022-01-04 NOTE — Progress Notes (Unsigned)
Future Appointments  Date Time Provider Department  01/05/2022  3:30 PM Unk Pinto, MD [facility]  04/14/2022               CPE  2:00 PM Alycia Rossetti, NP [facility]  06/23/2022             Wellness  4:00 PM Alycia Rossetti, NP [facility]    History of Present Illness:       This very nice 71 y.o. MWF  presents for 3 month follow up with HTN, HLD, Insulin Requiring T2_NIDDM  and Vitamin D Deficiency.  CT scan in 20218 showed Aortic Arteriosclerosis. 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  In April , she was started on Depakote for Hereditary  Essential Tremor. Patient is felt cognitively challenged with poor insight into her diseases.       Patient is treated for HTN  since 1980  & BP has been controlled at home. Today's  . In 2020, patient had a Negative /Normal Heart Cath by Dr Ellyn Hack.  Patient has had no complaints of any cardiac type chest pain, palpitations, dyspnea / orthopnea / PND, dizziness, claudication, or dependent edema.       Hyperlipidemia is not controlled with diet & she refuses to take Cholesterol meds.  . Last Lipids were not at goal :  Lab Results  Component Value Date   CHOL 212 (H) 09/29/2021   HDL 49 (L) 09/29/2021   LDLCALC 128 (H) 09/29/2021   TRIG 210 (H) 09/29/2021   CHOLHDL 4.3 09/29/2021     Also, the patient has history of T2_NIDDM (2000) with long hx/o poor compliance.    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 Patient takes Lantus & Glipizide & has had no symptoms of reactive hypoglycemia, diabetic polys, paresthesias or visual blurring.  Last A1c was   Lab Results  Component Value Date   HGBA1C 7.2 (H) 09/29/2021        Further, the patient also has history of Vitamin D Deficiency ("17"/ 2009)  and supplements vitamin D  very sporadically. Last vitamin D was still very low :  Lab Results  Component Value Date   VD25OH 13 (L) 03/17/2021     Current Outpatient Medications on File Prior to Visit  Medication Sig    acetaminophen (TYLENOL) 500 MG tablet Take 500 mg by mouth as directed. Take 2 tablets three times ad ay as needed   ALPRAZolam (XANAX) 1 MG tablet Take  1/2 to 1 tablet   Daily    ONLY  if needed for Severe  Anxiety Attack &  limit to 5 days/week to avoid Addiction and Dementia.   aspirin EC 81 MG tablet Take 81 mg by mouth daily.   divalproex (DEPAKOTE) 250 MG DR tablet Take 1 tablet 3 x /day for Hereditary tremor or "Shaking Palsy"   DULoxetine (CYMBALTA) 20 MG capsule Take  1 capsule  Daily  for Chronic Pain   gabapentin (NEURONTIN) 100 MG capsule TAKE ONE CAPSULE BY MOUTH THREE TIMES DAILY   glimepiride (AMARYL) 4 MG tablet TAKE ONE TABLET BY MOUTH TWICE DAILY with a meal FOR DIABETES   hydrOXYzine (ATARAX) 25 MG tablet Take 1 tablet 3 x /day fr Nerves /Anxiety or 1 to 2 tablets at Bedtime as needed for Sleep   Insulin Pen Needle (CAREFINE PEN NEEDLES) 32G X 4 MM MISC Use 1x a day   LANTUS SOLOSTAR 100 UNIT/ML Solostar Pen Inject 23 Units into the skin  at bedtime.   lisinopril (ZESTRIL) 2.5 MG tablet TAKE ONE TABLET BY MOUTH NIGHTLY FOR BLOOD PRESSURE   meloxicam (MOBIC) 15 MG tablet Take one daily with food for 2 weeks, can take with tylenol, can not take with aleve, iburpofen, then as needed daily for pain   propranolol (INDERAL) 20 MG tablet TAKE 1/2 TO 1 TABLET BY MOUTH TWICE DAILY AS NEEDED FOR tremors /shakes   No current facility-administered medications on file prior to visit.     Allergies  Allergen Reactions   Actos [Pioglitazone] Other (See Comments)    Patient stated that it elevated her BGL   Glyburide Other (See Comments)    Patient stated that it elevated her BGL   Metformin And Related     "metformin caused mini-strokes" and ELEVATED BS and also GLYBURIDE, ONGLYZA, ACTOS, INVOKANA caused elevated BS   Onglyza [Saxagliptin] Other (See Comments)    Patient stated that it elevated her BGL   Rosuvastatin Diarrhea   Insulins Rash   Janumet [Sitagliptin-Metformin Hcl]  Rash and Other (See Comments)    Shakes/ rash   Penicillins Rash and Other (See Comments)     PMHx:   Past Medical History:  Diagnosis Date   Anxiety    Anxiety disorder    Cirrhosis ([place])    COPD (chronic obstructive pulmonary disease) ([place])    followed by pcp--  last exacerbation 11/ 2017   Coronary artery disease, non-occlusive 03/2018   Cardiac Cath: Moderate (60%) proximal-mid RCA (FFR 0.89 => NEGATIVE / not physiologically significant).  Normal EF and EDP.   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--  "gets real sharp stabbing pain over eye and speech screws up"  pt states takes excederin migraine and lies down (pt stated was told if she "if come to ER again they would send her to psych ward")   Hyperlipidemia    Hypertension    OA (osteoarthritis)    back, hips, knees, feet   Osteoporosis    Polyp of colon, hyperplastic    Pulmonary nodules    per CT chest 08-28-2016  -- bilateral upper lobe nodules   S/P right hip fracture 09/06/2019   Stroke ([place])    Tubular adenoma of colon    Type 2 diabetes mellitus ([place])    followed by pcp--  last A1c 9.1 on 08-01-2016   UTI (urinary tract infection)    Wears dentures      Immunization History  Administered Date(s) Administered   Hep A / Hep B 01/07/2017, 02/08/2017, 07/09/2017   Td 06/26/2001   Tdap 04/30/2015     Past Surgical History:  Procedure Laterality Date   BILATERAL SALPINGOOPHORECTOMY  1997   via Laparoscopy   CHOLECYSTECTOMY N/A 09/30/2016   Procedure: LAPAROSCOPIC CHOLECYSTECTOMY;  Surgeon: Mickeal Skinner, MD;  Location: [facility];  Service: General;  Laterality: N/A;   INTRAVASCULAR PRESSURE WIRE/FFR STUDY N/A 04/12/2018   Procedure: INTRAVASCULAR PRESSURE WIRE/FFR STUDY;  Surgeon: Leonie Man, MD;  Location: [facility];  Service: Cardiovascular;  Laterality: N/A;   KNEE ARTHROSCOPY Right  11/2014   LEFT HEART CATH AND CORONARY ANGIOGRAPHY N/A 04/12/2018   Procedure: LEFT HEART CATH AND CORONARY ANGIOGRAPHY;  Surgeon: Leonie Man, MD;  Location: [facility];  Service: Cardiovascular;  Laterality: N/A;   NM MYOVIEW LTD  07/03/2020  EF>65%.  No EKG changes.  LOW RISK.  No ischemia or infarction.   ORIF HIP FRACTURE Right 2021   [facility], Dr. Ian Malkin   TRANSTHORACIC ECHOCARDIOGRAM  02/2018    Normal LV function.  EF 60 to 65%.  No RWMMA.  GRII DD.  Moderate LA dilation.   VAGINAL HYSTERECTOMY  age 21    FHx:    Reviewed / unchanged  SHx:    Reviewed / unchanged   Systems Review:  Constitutional: Denies fever, chills, wt changes, headaches, insomnia, fatigue, night sweats, change in appetite. Eyes: Denies redness, blurred vision, diplopia, discharge, itchy, watery eyes.  ENT: Denies discharge, congestion, post nasal drip, epistaxis, sore throat, earache, hearing loss, dental pain, tinnitus, vertigo, sinus pain, snoring.  CV: Denies chest pain, palpitations, irregular heartbeat, syncope, dyspnea, diaphoresis, orthopnea, PND, claudication or edema. Respiratory: denies cough, dyspnea, DOE, pleurisy, hoarseness, laryngitis, wheezing.  Gastrointestinal: Denies dysphagia, odynophagia, heartburn, reflux, water brash, abdominal pain or cramps, nausea, vomiting, bloating, diarrhea, constipation, hematemesis, melena, hematochezia  or hemorrhoids. Genitourinary: Denies dysuria, frequency, urgency, nocturia, hesitancy, discharge, hematuria or flank pain. Musculoskeletal: Denies arthralgias, myalgias, stiffness, jt. swelling, pain, limping or strain/sprain.  Skin: Denies pruritus, rash, hives, warts, acne, eczema or change in skin lesion(s). Neuro: No weakness, tremor, incoordination, spasms, paresthesia or pain. Psychiatric: Denies confusion, memory loss or sensory loss. Endo: Denies change in weight, skin or hair change.  Heme/Lymph: No excessive bleeding, bruising  or enlarged lymph nodes.  Physical Exam  There were no vitals taken for this visit.  Appears  well nourished, well groomed  and in no distress.  Eyes: PERRLA, EOMs, conjunctiva no swelling or erythema. Sinuses: No frontal/maxillary tenderness ENT/Mouth: EAC's clear, TM's nl w/o erythema, bulging. Nares clear w/o erythema, swelling, exudates. Oropharynx clear without erythema or exudates. Oral hygiene is good. Tongue normal, non obstructing. Hearing intact.  Neck: Supple. Thyroid not palpable. Car 2+/2+ without bruits, nodes or JVD. Chest: Respirations nl with BS clear & equal w/o rales, rhonchi, wheezing or stridor.  Cor: Heart sounds normal w/ regular rate and rhythm without sig. murmurs, gallops, clicks or rubs. Peripheral pulses normal and equal  without edema.  Abdomen: Soft & bowel sounds normal. Non-tender w/o guarding, rebound, hernias, masses or organomegaly.  Lymphatics: Unremarkable.  Musculoskeletal: Full ROM all peripheral extremities, joint stability, 5/5 strength and normal gait.  Skin: Warm, dry without exposed rashes, lesions or ecchymosis apparent.  Neuro: Cranial nerves intact, reflexes equal bilaterally. Sensory-motor testing grossly intact. Tendon reflexes grossly intact.  Pysch: Alert & oriented x 3.  Insight and judgement are very poor with limited insight . No ideations.  Assessment and Plan:  1. Essential hypertension  - Continue medication, monitor blood pressure at home.  - Continue DASH diet.  Reminder to go to the ER if any CP,  SOB, nausea, dizziness, severe HA, changes vision/speech.   - CBC with Differential/Platelet - COMPLETE METABOLIC PANEL WITH GFR - Magnesium - TSH  2. Hyperlipidemia associated with type 2 diabetes mellitus ([place])  - Continue diet/meds, exercise,& lifestyle modifications.  - Continue monitor periodic cholesterol/liver & renal functions    - Lipid panel - TSH  3. Type 2 diabetes mellitus with other circulatory   complication, with long-term current use of insulin (H[facility])  - Continue diet, exercise  - Lifestyle modifications.  - Monitor appropriate labs     - Hemoglobin A1c  4. Vitamin D deficiency  - Continue supplementation   - VITAMIN D 25 Hydroxy   5. Type 2  diabetes mellitus with stage 2 chronic kidney  disease, with long-term current use of insulin (H[facility])  - Hemoglobin A1c  6. Poor compliance with medication   7. Type 2 diabetes mellitus with atherosclerosis of aorta (HCC)  - Hemoglobin A1c  8. Liver cirrhosis secondary to NASH ([facility])  - COMPLETE METABOLIC PANEL WITH GFR  9. Statin intolerance  - Lipid panel  10. Cognitive dysfunction   11. Medication management  - CBC with Differential/Platelet - COMPLETE METABOLIC PANEL WITH GFR - Magnesium - Lipid panel - TSH - Hemoglobin A1c - VITAMIN D 25 Hydroxy          Discussed  regular exercise, BP monitoring, weight control to achieve/maintain BMI less than 25 and discussed med and SE's. Recommended labs to assess /monitor clinical status .  I discussed the assessment and treatment plan with the patient. The patient was provided an opportunity to ask questions and all were answered. The patient agreed with the plan and demonstrated an understanding of the instructions.  I provided over 30 minutes of exam, counseling, chart review and  complex critical decision making.        The patient was advised to call back or seek an in-person evaluation if the symptoms worsen or if the condition fails to improve as anticipated.   Kirtland Bouchard, MD

## 2022-01-05 ENCOUNTER — Encounter: Payer: Self-pay | Admitting: Internal Medicine

## 2022-01-05 ENCOUNTER — Ambulatory Visit (INDEPENDENT_AMBULATORY_CARE_PROVIDER_SITE_OTHER): Payer: PPO | Admitting: Internal Medicine

## 2022-01-05 VITALS — BP 130/78 | HR 82 | Temp 97.9°F | Resp 16 | Ht <= 58 in | Wt 138.0 lb

## 2022-01-05 DIAGNOSIS — N182 Chronic kidney disease, stage 2 (mild): Secondary | ICD-10-CM | POA: Diagnosis not present

## 2022-01-05 DIAGNOSIS — E559 Vitamin D deficiency, unspecified: Secondary | ICD-10-CM

## 2022-01-05 DIAGNOSIS — K7460 Unspecified cirrhosis of liver: Secondary | ICD-10-CM | POA: Diagnosis not present

## 2022-01-05 DIAGNOSIS — Z789 Other specified health status: Secondary | ICD-10-CM

## 2022-01-05 DIAGNOSIS — I10 Essential (primary) hypertension: Secondary | ICD-10-CM | POA: Diagnosis not present

## 2022-01-05 DIAGNOSIS — Z79899 Other long term (current) drug therapy: Secondary | ICD-10-CM

## 2022-01-05 DIAGNOSIS — E1169 Type 2 diabetes mellitus with other specified complication: Secondary | ICD-10-CM

## 2022-01-05 DIAGNOSIS — K7581 Nonalcoholic steatohepatitis (NASH): Secondary | ICD-10-CM

## 2022-01-05 DIAGNOSIS — E785 Hyperlipidemia, unspecified: Secondary | ICD-10-CM | POA: Diagnosis not present

## 2022-01-05 DIAGNOSIS — I700 Atherosclerosis of aorta: Secondary | ICD-10-CM

## 2022-01-05 DIAGNOSIS — G250 Essential tremor: Secondary | ICD-10-CM | POA: Diagnosis not present

## 2022-01-05 DIAGNOSIS — Z91148 Patient's other noncompliance with medication regimen for other reason: Secondary | ICD-10-CM

## 2022-01-05 DIAGNOSIS — E1122 Type 2 diabetes mellitus with diabetic chronic kidney disease: Secondary | ICD-10-CM | POA: Diagnosis not present

## 2022-01-05 DIAGNOSIS — E1151 Type 2 diabetes mellitus with diabetic peripheral angiopathy without gangrene: Secondary | ICD-10-CM | POA: Diagnosis not present

## 2022-01-05 DIAGNOSIS — F09 Unspecified mental disorder due to known physiological condition: Secondary | ICD-10-CM

## 2022-01-05 DIAGNOSIS — E1159 Type 2 diabetes mellitus with other circulatory complications: Secondary | ICD-10-CM | POA: Diagnosis not present

## 2022-01-05 DIAGNOSIS — Z794 Long term (current) use of insulin: Secondary | ICD-10-CM | POA: Diagnosis not present

## 2022-01-05 DIAGNOSIS — E1140 Type 2 diabetes mellitus with diabetic neuropathy, unspecified: Secondary | ICD-10-CM

## 2022-01-05 DIAGNOSIS — G4701 Insomnia due to medical condition: Secondary | ICD-10-CM

## 2022-01-05 MED ORDER — GABAPENTIN 800 MG PO TABS
ORAL_TABLET | ORAL | 3 refills | Status: DC
Start: 2022-01-05 — End: 2022-09-03

## 2022-01-05 MED ORDER — NADOLOL 20 MG PO TABS: ORAL_TABLET | ORAL | 3 refills | Status: DC

## 2022-01-05 NOTE — Patient Instructions (Signed)
PLEASE STOP SMOKING   Due to recent changes in healthcare laws, you may see the results of your imaging and laboratory studies on MyChart before your provider has had a chance to review them.  We understand that in some cases there may be results that are confusing or concerning to you. Not all laboratory results come back in the same time frame and the provider may be waiting for multiple results in order to interpret others.  Please give [place] 48 hours in order for your provider to thoroughly review all the results before contacting the office for clarification of your results.  ++++++++++++++++++++++++++  Vit D  & Vit C 1,000 mg   are recommended to help protect  against the Covid-19 and other Corona viruses.    Also it's recommended  to take  Zinc 50 mg  to help  protect against the Covid-19   and best place to get  is also on [email or web address].[email or web address]  and don't pay more than 6-8 cents /pill !   +++++++++++++++++++++++++++++++++++++++ Recommend Adult Low Dose Aspirin or  coated  Aspirin 81 mg daily  To reduce risk of Colon Cancer 40 %,  Skin Cancer 26 % ,  Melanoma 46%  and  Pancreatic cancer 60% +++++++++++++++++++++++++++++++++++++++++ Vitamin D goal  is between 70-100.  Please make sure that you are taking your Vitamin D as directed.  It is very important as a natural anti-inflammatory  helping hair, skin, and nails, as well as reducing stroke and heart attack risk.  It helps your bones and helps with mood. It also decreases numerous cancer risks so please take it as directed.  Low Vit D is associated with a 200-300% higher risk for CANCER  and 200-300% higher risk for HEART   ATTACK  &  STROKE.   .....................................Marland Kitchen It is also associated with higher death rate at younger ages,  autoimmune diseases like Rheumatoid arthritis, Lupus, Multiple Sclerosis.    Also many other serious conditions, like depression, Alzheimer's Dementia, infertility, muscle aches, fatigue,  fibromyalgia - just to name a few. +++++++++++++++++++++++++++++++++++++++++ Recommend the book "The END of DIETING" by Dr Excell Seltzer  & the book "The END of DIABETES " by Dr Excell Seltzer At [email or web address] - get book & Audio CD's    Being diabetic has a  300% increased risk for heart attack, stroke, cancer, and alzheimer- type vascular dementia. It is very important that you work harder with diet by avoiding all foods that are white. Avoid white rice (brown & wild rice is OK), white potatoes (sweetpotatoes in moderation is OK), White bread or wheat bread or anything made out of white flour like bagels, donuts, rolls, buns, biscuits, cakes, pastries, cookies, pizza crust, and pasta (made from white flour & egg whites) - vegetarian pasta or spinach or wheat pasta is OK. Multigrain breads like Arnold's or Pepperidge Farm, or multigrain sandwich thins or flatbreads.  Diet, exercise and weight loss can reverse and cure diabetes in the early stages.  Diet, exercise and weight loss is very important in the control and prevention of complications of diabetes which affects every system in your body, ie. Brain - dementia/stroke, eyes - glaucoma/blindness, heart - heart attack/heart failure, kidneys - dialysis, stomach - gastric paralysis, intestines - malabsorption, nerves - severe painful neuritis, circulation - gangrene & loss of a leg(s), and finally cancer and Alzheimers.    I recommend avoid fried & greasy foods,  sweets/candy, white rice (brown or wild rice or Quinoa is OK), white potatoes (sweet  potatoes are OK) - anything made from white flour - bagels, doughnuts, rolls, buns, biscuits,white and wheat breads, pizza crust and traditional pasta made of white flour & egg white(vegetarian pasta or spinach or wheat pasta is OK).  Multi-grain bread is OK - like multi-grain flat bread or sandwich thins. Avoid alcohol in excess. Exercise is also important.    Eat all the vegetables you want - avoid meat, especially red  meat and dairy - especially cheese.  Cheese is the most concentrated form of trans-fats which is the worst thing to clog up our arteries. Veggie cheese is OK which can be found in the fresh produce section at [employer] or [employer] or [employer]  +++++++++++++++++++++++++++++++++++++++ DASH Eating Plan  DASH stands for "Dietary Approaches to Stop Hypertension."   The DASH eating plan is a healthy eating plan that has been shown to reduce high blood pressure (hypertension). Additional health benefits may include reducing the risk of type 2 diabetes mellitus, heart disease, and stroke. The DASH eating plan may also help with weight loss. WHAT DO I NEED TO KNOW ABOUT THE DASH EATING PLAN? For the DASH eating plan, you will follow these general guidelines: Choose foods with a percent daily value for sodium of less than 5% (as listed on the food label). Use salt-free seasonings or herbs instead of table salt or sea salt. Check with your health care provider or pharmacist before using salt substitutes. Eat lower-sodium products, often labeled as "lower sodium" or "no salt added." Eat fresh foods. Eat more vegetables, fruits, and low-fat dairy products. Choose whole grains. Look for the word "whole" as the first word in the ingredient list. Choose fish  Limit sweets, desserts, sugars, and sugary drinks. Choose heart-healthy fats. Eat veggie cheese  Eat more home-cooked food and less restaurant, buffet, and fast food. Limit fried foods. Cook foods using methods other than frying. Limit canned vegetables. If you do use them, rinse them well to decrease the sodium. When eating at a restaurant, ask that your food be prepared with less salt, or no salt if possible.                      WHAT FOODS CAN I EAT? Read Dr Fara Olden Fuhrman's books on The End of Dieting & The End of Diabetes  Grains Whole grain or whole wheat bread. Brown rice. Whole grain or whole wheat pasta. Quinoa, bulgur, and whole  grain cereals. Low-sodium cereals. Corn or whole wheat flour tortillas. Whole grain cornbread. Whole grain crackers. Low-sodium crackers.  Vegetables Fresh or frozen vegetables (raw, steamed, roasted, or grilled). Low-sodium or reduced-sodium tomato and vegetable juices. Low-sodium or reduced-sodium tomato sauce and paste. Low-sodium or reduced-sodium canned vegetables.   Fruits All fresh, canned (in natural juice), or frozen fruits.  Protein Products  All fish and seafood.  Dried beans, peas, or lentils. Unsalted nuts and seeds. Unsalted canned beans.  Dairy Low-fat dairy products, such as skim or 1% milk, 2% or reduced-fat cheeses, low-fat ricotta or cottage cheese, or plain low-fat yogurt. Low-sodium or reduced-sodium cheeses.  Fats and Oils Tub margarines without trans fats. Light or reduced-fat mayonnaise and salad dressings (reduced sodium). Avocado. Safflower, olive, or canola oils. Natural peanut or almond butter.  Other Unsalted popcorn and pretzels. The items listed above may not be a complete list of recommended foods or beverages. Contact your dietitian for more options.  +++++++++++++++  WHAT FOODS ARE NOT RECOMMENDED? Grains/ White flour or wheat flour White bread.  White pasta. White rice. Refined cornbread. Bagels and croissants. Crackers that contain trans fat.  Vegetables  Creamed or fried vegetables. Vegetables in a . Regular canned vegetables. Regular canned tomato sauce and paste. Regular tomato and vegetable juices.  Fruits Dried fruits. Canned fruit in light or heavy syrup. Fruit juice.  Meat and Other Protein Products Meat in general - RED meat & White meat.  Fatty cuts of meat. Ribs, chicken wings, all processed meats as bacon, sausage, bologna, salami, fatback, hot dogs, bratwurst and packaged luncheon meats.  Dairy Whole or 2% milk, cream, half-and-half, and cream cheese. Whole-fat or sweetened yogurt. Full-fat cheeses or blue cheese. Non-dairy creamers  and whipped toppings. Processed cheese, cheese spreads, or cheese curds.  Condiments Onion and garlic salt, seasoned salt, table salt, and sea salt. Canned and packaged gravies. Worcestershire sauce. Tartar sauce. Barbecue sauce. Teriyaki sauce. Soy sauce, including reduced sodium. Steak sauce. Fish sauce. Oyster sauce. Cocktail sauce. Horseradish. Ketchup and mustard. Meat flavorings and tenderizers. Bouillon cubes. Hot sauce. Tabasco sauce. Marinades. Taco seasonings. Relishes.  Fats and Oils Butter, stick margarine, lard, shortening and bacon fat. Coconut, palm kernel, or palm oils. Regular salad dressings.  Pickles and olives. Salted popcorn and pretzels.  The items listed above may not be a complete list of foods and beverages to avoid.

## 2022-01-06 LAB — COMPLETE METABOLIC PANEL WITH GFR
AG Ratio: 1.2 (calc) (ref 1.0–2.5)
ALT: 18 U/L (ref 6–29)
AST: 18 U/L (ref 10–35)
Albumin: 3.3 g/dL — ABNORMAL LOW (ref 3.6–5.1)
Alkaline phosphatase (APISO): 126 U/L (ref 37–153)
BUN: 16 mg/dL (ref 7–25)
CO2: 28 mmol/L (ref 20–32)
Calcium: 8.9 mg/dL (ref 8.6–10.4)
Chloride: 96 mmol/L — ABNORMAL LOW (ref 98–110)
Creat: 0.69 mg/dL (ref 0.60–1.00)
Globulin: 2.7 g/dL (calc) (ref 1.9–3.7)
Glucose, Bld: 174 mg/dL — ABNORMAL HIGH (ref 65–99)
Potassium: 4 mmol/L (ref 3.5–5.3)
Sodium: 131 mmol/L — ABNORMAL LOW (ref 135–146)
Total Bilirubin: 0.3 mg/dL (ref 0.2–1.2)
Total Protein: 6 g/dL — ABNORMAL LOW (ref 6.1–8.1)
eGFR: 93 mL/min/{1.73_m2} (ref >=60)

## 2022-01-06 LAB — CBC WITH DIFFERENTIAL/PLATELET
Absolute Monocytes: 757 cells/uL (ref 200–950)
Basophils Absolute: 87 cells/uL (ref 0–200)
Basophils Relative: 0.9 %
Eosinophils Absolute: 504 cells/uL — ABNORMAL HIGH (ref 15–500)
Eosinophils Relative: 5.2 %
HCT: 38.1 % (ref 35.0–45.0)
Hemoglobin: 13.3 g/dL (ref 11.7–15.5)
Lymphs Abs: 1775 cells/uL (ref 850–3900)
MCH: 31.7 pg (ref 27.0–33.0)
MCHC: 34.9 g/dL (ref 32.0–36.0)
MCV: 90.7 fL (ref 80.0–100.0)
MPV: 10.1 fL (ref 7.5–12.5)
Monocytes Relative: 7.8 %
Neutro Abs: 6577 cells/uL (ref 1500–7800)
Neutrophils Relative %: 67.8 %
Platelets: 248 10*3/uL (ref 140–400)
RBC: 4.2 10*6/uL (ref 3.80–5.10)
RDW: 11.5 % (ref 11.0–15.0)
Total Lymphocyte: 18.3 %
WBC: 9.7 10*3/uL (ref 3.8–10.8)

## 2022-01-06 LAB — LIPID PANEL
Cholesterol: 246 mg/dL — ABNORMAL HIGH (ref <200)
HDL: 61 mg/dL (ref >=50)
LDL Cholesterol (Calc): 149 mg/dL (calc) — ABNORMAL HIGH
Non-HDL Cholesterol (Calc): 185 mg/dL (calc) — ABNORMAL HIGH (ref <130)
Total CHOL/HDL Ratio: 4 (calc) (ref <5.0)
Triglycerides: 217 mg/dL — ABNORMAL HIGH (ref <150)

## 2022-01-06 LAB — HEMOGLOBIN A1C
Hgb A1c MFr Bld: 7 % of total Hgb — ABNORMAL HIGH (ref <5.7)
Mean Plasma Glucose: 154 mg/dL
eAG (mmol/L): 8.5 mmol/L

## 2022-01-06 LAB — VITAMIN D 25 HYDROXY (VIT D DEFICIENCY, FRACTURES): Vit D, 25-Hydroxy: 9 ng/mL — ABNORMAL LOW (ref 30–100)

## 2022-01-06 LAB — TSH: TSH: 0.77 mIU/L (ref 0.40–4.50)

## 2022-01-06 LAB — MAGNESIUM: Magnesium: 1.7 mg/dL (ref 1.5–2.5)

## 2022-01-06 NOTE — Progress Notes (Signed)
<><><><><><><><><><><><><><><><><><><><><><><><><><><><><><><><><> <><><><><><><><><><><><><><><><><><><><><><><><><><><><><><><><><>  -    Vitamin D  - Extremely Low  & high Risk for many medical Problems  ! - Vitamin D goal is between 70-100.   - Please  Vitamin D 5,000 units x 2 capsules = 10,000 units  /day   - It is very important as a natural anti-inflammatory and helping the  immune system protect against viral infections, like the Covid-19    helping hair, skin, and nails, as well as reducing stroke and  heart attack risk.   - It helps your bones and helps with mood.  - It also decreases numerous cancer risks so please  take it as directed.   - Low Vit D is associated with a 200-300% higher risk for  CANCER   and 200-300% higher risk for HEART   ATTACK  &  STROKE.    - It is also associated with higher death rate at younger ages,   autoimmune diseases like Rheumatoid arthritis, Lupus,  Multiple Sclerosis.     - Also many other serious conditions, like depression, Alzheimer's  Dementia, infertility, muscle aches, fatigue, fibromyalgia   - just to name a few. <><><><><><><><><><><><><><><><><><><><><><><><><><><><><><><><><> <><><><><><><><><><><><><><><><><><><><><><><><><><><><><><><><><>  - Glucose = 174  mg%  &  A1c = 7.0% - Both Too High  & are the Result of a BAD Diet !   - The cause is Bad Diet !  - A who will pay the Price  !  - Also Chol = 2646  is way too high !  - Recommend a stricter plant based  low cholesterol diet   - Cholesterol only comes from animal sources  - ie. meat, dairy, egg yolks  - Eat all the vegetables you want.  - Avoid meat, Avoid Meat, Avoid Meat , Avoid Meat !                                                               especially red meat - Beef AND Pork   - Avoid cheese & dairy - milk & ice cream.    - Cheese is the most concentrated form of trans-fats which  is the worst thing to clog up our arteries.   - Veggie  cheese is OK which can be found in the fresh produce section at                                                          [employer] or [employer] or [employer]  <><><><><><><><><><><><><><><><><><><><><><><><><><><><><><><><><> <><><><><><><><><><><><><><><><><><><><><><><><><><><><><><><><><>  -

## 2022-03-04 ENCOUNTER — Other Ambulatory Visit: Payer: Self-pay | Admitting: Nurse Practitioner

## 2022-03-04 DIAGNOSIS — Z79899 Other long term (current) drug therapy: Secondary | ICD-10-CM

## 2022-03-04 DIAGNOSIS — E1151 Type 2 diabetes mellitus with diabetic peripheral angiopathy without gangrene: Secondary | ICD-10-CM

## 2022-03-07 ENCOUNTER — Emergency Department (HOSPITAL_COMMUNITY): Payer: PPO

## 2022-03-07 ENCOUNTER — Emergency Department (HOSPITAL_COMMUNITY)
Admission: EM | Admit: 2022-03-07 | Discharge: 2022-03-08 | Disposition: A | Discharge status: Home / Self Care | Payer: PPO | Attending: Emergency Medicine | Admitting: Emergency Medicine

## 2022-03-07 DIAGNOSIS — Z7982 Long term (current) use of aspirin: Secondary | ICD-10-CM | POA: Diagnosis not present

## 2022-03-07 DIAGNOSIS — D72829 Elevated white blood cell count, unspecified: Secondary | ICD-10-CM | POA: Diagnosis not present

## 2022-03-07 DIAGNOSIS — G4489 Other headache syndrome: Secondary | ICD-10-CM | POA: Diagnosis not present

## 2022-03-07 DIAGNOSIS — I629 Nontraumatic intracranial hemorrhage, unspecified: Secondary | ICD-10-CM | POA: Diagnosis not present

## 2022-03-07 DIAGNOSIS — E875 Hyperkalemia: Secondary | ICD-10-CM | POA: Insufficient documentation

## 2022-03-07 DIAGNOSIS — R29810 Facial weakness: Secondary | ICD-10-CM | POA: Diagnosis not present

## 2022-03-07 DIAGNOSIS — R519 Headache, unspecified: Secondary | ICD-10-CM | POA: Insufficient documentation

## 2022-03-07 DIAGNOSIS — E1122 Type 2 diabetes mellitus with diabetic chronic kidney disease: Secondary | ICD-10-CM | POA: Insufficient documentation

## 2022-03-07 DIAGNOSIS — R200 Anesthesia of skin: Secondary | ICD-10-CM | POA: Insufficient documentation

## 2022-03-07 DIAGNOSIS — I959 Hypotension, unspecified: Secondary | ICD-10-CM | POA: Diagnosis not present

## 2022-03-07 DIAGNOSIS — R471 Dysarthria and anarthria: Secondary | ICD-10-CM

## 2022-03-07 DIAGNOSIS — N189 Chronic kidney disease, unspecified: Secondary | ICD-10-CM | POA: Insufficient documentation

## 2022-03-07 DIAGNOSIS — I129 Hypertensive chronic kidney disease with stage 1 through stage 4 chronic kidney disease, or unspecified chronic kidney disease: Secondary | ICD-10-CM | POA: Diagnosis not present

## 2022-03-07 DIAGNOSIS — R29818 Other symptoms and signs involving the nervous system: Secondary | ICD-10-CM | POA: Diagnosis not present

## 2022-03-07 DIAGNOSIS — R4701 Aphasia: Secondary | ICD-10-CM | POA: Diagnosis not present

## 2022-03-07 DIAGNOSIS — Z794 Long term (current) use of insulin: Secondary | ICD-10-CM | POA: Insufficient documentation

## 2022-03-07 DIAGNOSIS — I6782 Cerebral ischemia: Secondary | ICD-10-CM | POA: Diagnosis not present

## 2022-03-07 DIAGNOSIS — R4781 Slurred speech: Secondary | ICD-10-CM | POA: Diagnosis not present

## 2022-03-07 LAB — CBC WITH DIFFERENTIAL/PLATELET
Abs Immature Granulocytes: 0.12 10*3/uL — ABNORMAL HIGH (ref 0.00–0.07)
Basophils Absolute: 0.1 10*3/uL (ref 0.0–0.1)
Basophils Relative: 1 %
Eosinophils Absolute: 0.5 10*3/uL (ref 0.0–0.5)
Eosinophils Relative: 4 %
HCT: 40 % (ref 36.0–46.0)
Hemoglobin: 13.8 g/dL (ref 12.0–15.0)
Immature Granulocytes: 1 %
Lymphocytes Relative: 17 %
Lymphs Abs: 2.1 10*3/uL (ref 0.7–4.0)
MCH: 30.9 pg (ref 26.0–34.0)
MCHC: 34.5 g/dL (ref 30.0–36.0)
MCV: 89.7 fL (ref 80.0–100.0)
Monocytes Absolute: 1.1 10*3/uL — ABNORMAL HIGH (ref 0.1–1.0)
Monocytes Relative: 9 %
Neutro Abs: 8.2 10*3/uL — ABNORMAL HIGH (ref 1.7–7.7)
Neutrophils Relative %: 68 %
Platelets: 235 10*3/uL (ref 150–400)
RBC: 4.46 MIL/uL (ref 3.87–5.11)
RDW: 12.2 % (ref 11.5–15.5)
WBC: 12.2 10*3/uL — ABNORMAL HIGH (ref 4.0–10.5)
nRBC: 0 % (ref 0.0–0.2)

## 2022-03-07 LAB — COMPREHENSIVE METABOLIC PANEL
ALT: 16 U/L (ref 0–44)
AST: 29 U/L (ref 15–41)
Albumin: 2.6 g/dL — ABNORMAL LOW (ref 3.5–5.0)
Alkaline Phosphatase: 115 U/L (ref 38–126)
Anion gap: 7 (ref 5–15)
BUN: 21 mg/dL (ref 8–23)
CO2: 26 mmol/L (ref 22–32)
Calcium: 9.1 mg/dL (ref 8.9–10.3)
Chloride: 100 mmol/L (ref 98–111)
Creatinine, Ser: 0.96 mg/dL (ref 0.44–1.00)
GFR, Estimated: >60 mL/min (ref >60)
Glucose, Bld: 131 mg/dL — ABNORMAL HIGH (ref 70–99)
Potassium: 5.2 mmol/L — ABNORMAL HIGH (ref 3.5–5.1)
Sodium: 133 mmol/L — ABNORMAL LOW (ref 135–145)
Total Bilirubin: 0.5 mg/dL (ref 0.3–1.2)
Total Protein: 5.9 g/dL — ABNORMAL LOW (ref 6.5–8.1)

## 2022-03-07 LAB — CBG MONITORING, ED: Glucose-Capillary: 152 mg/dL — ABNORMAL HIGH (ref 70–99)

## 2022-03-07 MED ORDER — ACETAMINOPHEN 500 MG PO TABS
1000.0000 mg | ORAL_TABLET | Freq: Once | ORAL | Status: AC
  Administered 2022-03-07: 1000 mg via ORAL
  Filled 2022-03-07: qty 2

## 2022-03-07 NOTE — ED Notes (Signed)
Received verbal report from [place] at this time

## 2022-03-07 NOTE — ED Notes (Signed)
Remains in MRI 

## 2022-03-07 NOTE — ED Triage Notes (Signed)
Pt bib [facility] from home with complaints of slurred speech, facial droop, and aphasia since 03/05/22. Pt went to UC today for same and was sent here. Pt arrives with aphasia and right sided facial droop. AOx4

## 2022-03-07 NOTE — ED Provider Notes (Signed)
[facility] EMERGENCY DEPARTMENT Provider Note   CSN: [id number] Arrival date & time: 03/07/22  1641     History  Chief Complaint  Patient presents with   Aphasia    Sara Lamb is a 71 y.o. female PMH hypertension, hyperlipidemia, type 2 diabetes, CKD, liver cirrhosis who is presenting with aphasia.  Patient contributes the history.  She reports that 2 days ago, she noticed sudden onset slurred speech, difficulty "getting words out", and a slight facial droop.  She lives at home with her husband.  She did not present to the emergency department as she "does not trust hospitals".  She says that since it started, her symptoms are getting worse, which prompted her to go to an urgent care earlier today.  Patient was subsequently transferred here for additional evaluation.  Patient also notes some numbness/tingling in her left arm and a mild headache.  She denies any chest pain, difficulty breathing, abdominal pain, nausea, vomiting, diarrhea.  Review of systems otherwise positive for peripheral edema.  Home Medications Prior to Admission medications   Medication Sig Start Date End Date Taking? Authorizing Provider  acetaminophen (TYLENOL) 500 MG tablet Take 500 mg by mouth as directed. Take 2 tablets three times ad ay as needed    [provider]  ALPRAZolam Duanne Moron) 1 MG tablet Take  1/2 to 1 tablet   Daily    ONLY  if needed for Severe  Anxiety Attack &  limit to 5 days/week to avoid Addiction and Dementia. 10/06/21   Unk Pinto, MD  aspirin EC 81 MG tablet Take 81 mg by mouth daily.    [provider]  divalproex (DEPAKOTE) 250 MG DR tablet Take 1 tablet 3 x /day for Hereditary tremor or "Shaking Palsy" 10/30/21   Unk Pinto, MD  DULoxetine (CYMBALTA) 20 MG capsule Take  1 capsule  Daily  for Chronic Pain 10/06/21   Unk Pinto, MD  gabapentin (NEURONTIN) 100 MG capsule TAKE ONE CAPSULE BY MOUTH THREE TIMES DAILY 09/01/21   Alycia Rossetti,  NP  gabapentin (NEURONTIN) 800 MG tablet Take 1/2 to 1 tablet at Bedtime for Neuropathy & Sleep ( & continue 100 mg capsules 3 x /day ) 01/05/22   Unk Pinto, MD  glimepiride (AMARYL) 4 MG tablet TAKE ONE TABLET BY MOUTH TWICE DAILY with a meal FOR DIABETES 03/04/22   Alycia Rossetti, NP  hydrOXYzine (ATARAX) 25 MG tablet Take 1 tablet 3 x /day fr Nerves /Anxiety or 1 to 2 tablets at Bedtime as needed for Sleep 10/30/21   Unk Pinto, MD  Insulin Pen Needle (CAREFINE PEN NEEDLES) 32G X 4 MM MISC Use 1x a day 10/09/16   Philemon Kingdom, MD  LANTUS SOLOSTAR 100 UNIT/ML Solostar Pen Inject 23 Units into the skin at bedtime. 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  Physical Exam Updated Vital Signs BP (!) 159/53 (BP Location: Right Arm)   Pulse 70   Temp 98.4 F (36.9 C) (Oral)   Resp (!) 21   SpO2 95%  Physical Exam Vitals and nursing note reviewed.  Constitutional:      General: She is not in acute distress.    Appearance: Normal appearance. She is well-developed. She is not ill-appearing or diaphoretic.  HENT:     Head: Normocephalic and atraumatic.     Right Ear: External ear normal.     Left Ear: External ear normal.     Nose: Nose normal.     Mouth/Throat:     Mouth: Mucous membranes are moist.  Eyes:     Extraocular  Movements: Extraocular movements intact.     Pupils: Pupils are equal, round, and reactive to light.  Cardiovascular:     Rate and Rhythm: Normal rate and regular rhythm.     Heart sounds: No murmur heard. Pulmonary:     Effort: Pulmonary effort is normal. No respiratory distress.     Breath sounds: Normal breath sounds.  Abdominal:     General: There is no distension.     Palpations: Abdomen is soft.     Tenderness: There is no abdominal tenderness. There is no guarding.  Musculoskeletal:     Cervical back: Neck supple.     Right lower leg: Edema present.     Left lower leg: Edema present.  Skin:    General: Skin is warm and dry.  Neurological:     Mental Status: She is alert.     GCS: GCS eye subscore is 4. GCS verbal subscore is 4. GCS motor subscore is 6.     Cranial Nerves: Dysarthria and facial asymmetry present.     Sensory: Sensory deficit present.     Coordination: Coordination is intact. Finger-Nose-Finger Test normal.     Comments: Slight right sided facial droop. Halting, slurred speech.  Patient reports decreased sensation over left side of face compared to right.  Patient also notes decreased sensation over left upper extremity.  Full and equal strength in bilateral upper extremities.  Diminished strength right lower extremity compared to LLE, which patient reports is secondary to old injury.     ED Results / Procedures / Treatments   Labs (all labs ordered are listed, but only abnormal results are displayed) Labs Reviewed  CBG MONITORING, ED - Abnormal; Notable for the following components:      Result Value   Glucose-Capillary 152 (*)    All other components within normal limits  CBC WITH DIFFERENTIAL/PLATELET  COMPREHENSIVE METABOLIC PANEL    EKG None  Radiology CT Head Wo Contrast  Result Date: 03/07/2022 CLINICAL DATA:  Slurred speech and facial droop EXAM: CT HEAD WITHOUT CONTRAST TECHNIQUE: Contiguous axial images were obtained from the base of the  skull through the vertex without intravenous contrast. RADIATION DOSE REDUCTION: This exam was performed according to the departmental dose-optimization program which includes automated exposure control, adjustment of the mA and/or kV according to patient size and/or use of iterative reconstruction technique. COMPARISON:  MRI head 06/08/2015 and CT head 08/19/2019 FINDINGS: Brain: No intracranial hemorrhage, mass effect, or evidence of acute infarct. No hydrocephalus. No extra-axial fluid collection. Generalized cerebral atrophy. Ill-defined hypoattenuation within the cerebral white matter is nonspecific but consistent with chronic small vessel ischemic disease. Vascular: No hyperdense vessel. Atherosclerotic calcification of the intracranial internal carotid. Skull: No fracture or  focal lesion. Sinuses/Orbits: No acute finding. Paranasal sinuses and mastoid air cells are well aerated. Other: None. IMPRESSION: No acute intracranial abnormality. Electronically Signed   By: Placido Sou M.D.   On: 03/07/2022 17:58    Procedures Procedures    Medications Ordered in ED Medications - No data to display  ED Course/ Medical Decision Making/ A&P                           Medical Decision Making Amount and/or Complexity of Data Reviewed Labs: ordered. Radiology: ordered.  Risk OTC drugs.   Sara Lamb is a 71 y.o. female PMH hypertension, hyperlipidemia, type 2 diabetes, CKD, liver cirrhosis who is presenting with aphasia, slurred speech, and right sided facial droop concerning for stroke.    Vitals at presentation notable for hypertension.  Patient is hemodynamically stable, afebrile, satting well on room air.  Physical exam with multiple neurologic deficits including slight right-sided facial droop most notable around mouth.  Patient is aphasic with a halting, slurred speech.  Patient appears to have intact language comprehension, answers appropriately, however answers are short, punctuated by  frequent pauses, patient reports difficulty "getting the words out".  Patient also has sensory deficits over the left side of face compared to right and reports decree sensation over the left upper extremity. Normal heart sounds.  Lungs clear to auscultation bilaterally.  Abdomen is soft, nontender to palpation.  BLE pitting edema.  Initial differential includes but is not limited to: acute stroke (ischemic, hemorrhagic, LVO), hypoglycemia, altered mental status due to alternative causes (ie infectious, toxic, metabolic), encephalopathy  Lab work obtained, resulted notable for CBC with mild leukocytosis 12.2.  No anemia.  CMP demonstrates hyponatremia 133, potassium mildly elevated at 5.2, however with hemolysis.  No evidence of elevated LFTs or anion gap.  Fingerstick glucose 152.  CT head obtained, resulted per radiology read with no acute intracranial abnormalities.  I contacted neurology regarding patient's presentation.  They have recommended MRI brain.  If positive for evidence of stroke, they are recommending inpatient admission for additional treatment and evaluation.  If negative, they recommend discharge home with outpatient follow-up in their clinic in the next week.  Interventions include p.o. Tylenol  At the time of signout, the patient is awaiting results of her MRI brain, which will determine ultimate disposition as above.  The plan for this patient was discussed with Dr. Ashok Cordia, who voiced agreement and who oversaw evaluation and treatment of this patient.    Final Clinical Impression(s) / ED Diagnoses Final diagnoses:  Dysarthria    Rx / DC Orders ED Discharge Orders     None         Faylene Million, MD 03/08/22 [id number]    Lajean Saver, MD 03/08/22 [phone]

## 2022-03-08 ENCOUNTER — Other Ambulatory Visit: Payer: Self-pay

## 2022-03-08 ENCOUNTER — Encounter (HOSPITAL_COMMUNITY): Payer: Self-pay

## 2022-03-08 MED ORDER — IBUPROFEN 800 MG PO TABS
800.0000 mg | ORAL_TABLET | Freq: Once | ORAL | Status: AC
  Administered 2022-03-08: 800 mg via ORAL
  Filled 2022-03-08: qty 1

## 2022-03-09 ENCOUNTER — Telehealth: Payer: Self-pay

## 2022-03-09 NOTE — Telephone Encounter (Signed)
LM-03/09/22-Called pt. And completed ED discharge protocol. Pt. stated she went to ED for a severe headache, and face numbness. Pt. mentioned that this has happened before in the past and she thinks it was caused by stress. Pt. declined to go into further detail. Offered support, and provided pt. my contact info to call should she have any questions/concerns. Pt. verbalized understanding and agreed.  Total time spent: 15 min. (Other pt. Time, Pt. In [place])

## 2022-03-10 ENCOUNTER — Encounter: Payer: Self-pay | Admitting: Nurse Practitioner

## 2022-03-10 ENCOUNTER — Ambulatory Visit (INDEPENDENT_AMBULATORY_CARE_PROVIDER_SITE_OTHER): Payer: PPO | Admitting: Nurse Practitioner

## 2022-03-10 VITALS — BP 142/64 | HR 81 | Temp 97.7°F | Ht <= 58 in | Wt 135.0 lb

## 2022-03-10 DIAGNOSIS — E875 Hyperkalemia: Secondary | ICD-10-CM

## 2022-03-10 DIAGNOSIS — F09 Unspecified mental disorder due to known physiological condition: Secondary | ICD-10-CM | POA: Diagnosis not present

## 2022-03-10 DIAGNOSIS — E559 Vitamin D deficiency, unspecified: Secondary | ICD-10-CM

## 2022-03-10 DIAGNOSIS — N182 Chronic kidney disease, stage 2 (mild): Secondary | ICD-10-CM

## 2022-03-10 DIAGNOSIS — J449 Chronic obstructive pulmonary disease, unspecified: Secondary | ICD-10-CM | POA: Diagnosis not present

## 2022-03-10 DIAGNOSIS — Z794 Long term (current) use of insulin: Secondary | ICD-10-CM | POA: Diagnosis not present

## 2022-03-10 DIAGNOSIS — E1169 Type 2 diabetes mellitus with other specified complication: Secondary | ICD-10-CM

## 2022-03-10 DIAGNOSIS — Z79899 Other long term (current) drug therapy: Secondary | ICD-10-CM

## 2022-03-10 DIAGNOSIS — I10 Essential (primary) hypertension: Secondary | ICD-10-CM

## 2022-03-10 DIAGNOSIS — E1159 Type 2 diabetes mellitus with other circulatory complications: Secondary | ICD-10-CM

## 2022-03-10 DIAGNOSIS — E871 Hypo-osmolality and hyponatremia: Secondary | ICD-10-CM

## 2022-03-10 DIAGNOSIS — R471 Dysarthria and anarthria: Secondary | ICD-10-CM | POA: Diagnosis not present

## 2022-03-10 DIAGNOSIS — E785 Hyperlipidemia, unspecified: Secondary | ICD-10-CM | POA: Diagnosis not present

## 2022-03-10 DIAGNOSIS — E1122 Type 2 diabetes mellitus with diabetic chronic kidney disease: Secondary | ICD-10-CM

## 2022-03-10 MED ORDER — LISINOPRIL 2.5 MG PO TABS: ORAL_TABLET | ORAL | 0 refills | Status: DC

## 2022-03-10 NOTE — Progress Notes (Unsigned)
Hospital follow up  Assessment and Plan: Hospital visit follow up for:   1. Essential hypertension Discussed DASH (Dietary Approaches to Stop Hypertension) DASH diet is lower in sodium than a typical American diet. Cut back on foods that are high in saturated fat, cholesterol, and trans fats. Eat more whole-grain foods, fish, poultry, and nuts Remain active and exercise as tolerated daily.  Monitor BP at home-Call if greater than 130/80.  Check CMP/CBC  - lisinopril (ZESTRIL) 2.5 MG tablet; TAKE ONE TABLET BY MOUTH NIGHTLY FOR BLOOD PRESSURE  Dispense: 180 tablet; Refill: 0 - Urinalysis w microscopic + reflex cultur  2. Chronic obstructive pulmonary disease, unspecified COPD type ([place]) Continue inhalers Smoking cessation instruction/counseling given:  counseled patient on the dangers of tobacco use, advised patient to stop smoking, and reviewed strategies to maximize success   3. Hyperlipidemia associated with type 2 diabetes mellitus ([place]) Discussed lifestyle modifications. Recommended diet heavy in fruits and veggies, omega 3's. Decrease consumption of animal meats, cheeses, and dairy products. Remain active and exercise as tolerated. Continue to monitor.   4. Type 2 diabetes mellitus with other circulatory complication, with long-term current use of insulin [facility]) Education: Reviewed 'ABCs' of diabetes management  Discussed goals to be met and/or maintained include A1C (<7) Blood pressure (<130/80) Cholesterol (LDL <70) Continue Eye Exam yearly  Continue Dental Exam Q6 mo Discussed dietary recommendations Discussed Physical Activity recommendations Foot exam UTD Check A1C  - COMPLETE METABOLIC PANEL WITH GFR - Urinalysis w microscopic + reflex cultur  5. CKD stage 2 due to type 2 diabetes mellitus ([place]) Discussed how what you eat and drink can aide in kidney protection. Stay well hydrated. Avoid high salt foods. Avoid NSAIDS. Keep BP and BG well controlled.    Take medications as prescribed. Remain active and exercise as tolerated daily. Maintain weight.  Continue to monitor. Check CMP/GFR/Microablumin  6. Cognitive dysfunction Continue supplement Monitor levels  - Vitamin B12  7. Vitamin D deficiency Continue supplement Monitor levels  - VITAMIN D 25 Hydroxy (Vit-D Deficiency, Fractures)  8. Chronic hyponatremia Check CMP  9. Serum potassium elevated Check CMP  10. Medication management All medications discussed and reviewed in full. All questions and concerns regarding medications addressed.    - lisinopril (ZESTRIL) 2.5 MG tablet; TAKE ONE TABLET BY MOUTH NIGHTLY FOR BLOOD PRESSURE  Dispense: 180 tablet; Refill: 0 - COMPLETE METABOLIC PANEL WITH GFR - Magnesium - VITAMIN D 25 Hydroxy (Vit-D Deficiency, Fractures) - Vitamin B12 - Urinalysis w microscopic + reflex cultur   11.  Dysarthria Unresolved Refer to Neurology for further review and evaluation.  Orders Placed This Encounter  Procedures   Urine Culture   COMPLETE METABOLIC PANEL WITH GFR   Magnesium   VITAMIN D 25 Hydroxy (Vit-D Deficiency, Fractures)   Vitamin B12   Urinalysis w microscopic + reflex cultur   REFLEXIVE URINE CULTURE   Ambulatory referral to Neurology    Referral Priority:   Routine    Referral Type:   Consultation    Referral Reason:   Specialty Services Required    Requested Specialty:   Neurology    Number of Visits Requested:   1     Meds ordered this encounter  Medications   lisinopril (ZESTRIL) 2.5 MG tablet    Sig: TAKE ONE TABLET BY MOUTH NIGHTLY FOR BLOOD PRESSURE    Dispense:  180 tablet    Refill:  0   All medications were reviewed with patient and family and fully reconciled. All questions  answered fully, and patient and family members were encouraged to call the office with any further questions or concerns. Discussed goal to avoid readmission related to this diagnosis.   There are no discontinued medications.  Over  40 minutes of exam, counseling, chart review, and complex, high/moderate level critical decision making was performed this visit.   Future Appointments  Date Time Provider [place]  04/14/2022  2:00 PM Alycia Rossetti, NP [facility] None  06/23/2022  4:00 PM Alycia Rossetti, NP [facility] None     HPI 71 y.o.female presents alongside husband for follow up for transition from recent hospitalization ER stay. Admit date to the hospital was 03/07/22, patient was discharged from the hospital on 03/08/22 and our clinical staff contacted the office the day after discharge to set up a follow up appointment. The discharge summary, medications, and diagnostic test results were reviewed before meeting with the patient. The patient was admitted for:   Dysarthria.  Presented to ED for  slurred speech and left face tingling/numbness for past couple days.  Pts speech was of unusual quality, at times halting, and times mildly dysarthric, but not necessarily altered in a consistent way. No gross facial asymmetry/weakness noted in ER. Imaging ordered CT and MRI that were negative.  Neurology consulted but no follow ups.  Continues to have dysarthria today in clinic, however, inconsistent, similar to what was described in the ER visit.     She reports taking medications as directed.  Has hx of chronic hyponatremia.  Is prescribe Xanax and reports taking as directed as needed.    She does have right facial asymmetry present, husband reports this was p/t ER visit.  She denies hx of seizures.  She does have a hx of cognitive dysfunction and poor compliance with medication.    Home health is not involved.   Images while in the hospital: MR BRAIN WO CONTRAST  Result Date: 03/08/2022 CLINICAL DATA:  Initial evaluation for neuro deficit, stroke suspected. EXAM: MRI HEAD WITHOUT CONTRAST TECHNIQUE: Multiplanar, multiecho pulse sequences of the brain and surrounding structures were obtained without  intravenous contrast. COMPARISON:  Prior CT from earlier the same day. FINDINGS: Brain: Cerebral volume within normal limits for age. Mild chronic microvascular ischemic disease noted involving the pons. No evidence for acute or subacute ischemia. Gray-white matter differentiation maintained. No areas of chronic cortical infarction. No acute or chronic intracranial hemorrhage. No mass lesion, midline shift or mass effect. No hydrocephalus or extra-axial fluid collection. Pituitary gland and suprasellar region within normal limits. Vascular: Major intracranial vascular flow voids are maintained. Skull and upper cervical spine: Craniocervical junction within normal limits. Bone marrow signal intensity normal. No scalp soft tissue abnormality. Sinuses/Orbits: Globes orbital soft tissues within normal limits. Mild scattered mucosal thickening noted about the ethmoidal air cells and maxillary sinuses. No mastoid effusion. Other: None. IMPRESSION: 1. No acute intracranial infarct or other abnormality. 2. Mild chronic microvascular ischemic disease involving the pons. Electronically Signed   By: Jeannine Boga M.D.   On: 03/08/2022 00:47   CT Head Wo Contrast  Result Date: 03/07/2022 CLINICAL DATA:  Slurred speech and facial droop EXAM: CT HEAD WITHOUT CONTRAST TECHNIQUE: Contiguous axial images were obtained from the base of the skull through the vertex without intravenous contrast. RADIATION DOSE REDUCTION: This exam was performed according to the departmental dose-optimization program which includes automated exposure control, adjustment of the mA and/or kV according to patient size and/or use of iterative reconstruction technique. COMPARISON:  MRI head 06/08/2015  and CT head 08/19/2019 FINDINGS: Brain: No intracranial hemorrhage, mass effect, or evidence of acute infarct. No hydrocephalus. No extra-axial fluid collection. Generalized cerebral atrophy. Ill-defined hypoattenuation within the cerebral white  matter is nonspecific but consistent with chronic small vessel ischemic disease. Vascular: No hyperdense vessel. Atherosclerotic calcification of the intracranial internal carotid. Skull: No fracture or focal lesion. Sinuses/Orbits: No acute finding. Paranasal sinuses and mastoid air cells are well aerated. Other: None. IMPRESSION: No acute intracranial abnormality. Electronically Signed   By: Placido Sou M.D.   On: 03/07/2022 17:58     Current Outpatient Medications (Endocrine & Metabolic):    glimepiride (AMARYL) 4 MG tablet, TAKE ONE TABLET BY MOUTH TWICE DAILY with a meal FOR DIABETES   LANTUS SOLOSTAR 100 UNIT/ML Solostar Pen, Inject 23 Units into the skin at bedtime.  Current Outpatient Medications (Cardiovascular):    lisinopril (ZESTRIL) 2.5 MG tablet, TAKE ONE TABLET BY MOUTH NIGHTLY FOR BLOOD PRESSURE   nadolol (CORGARD) 20 MG tablet, Take 1 tablet Daily for BP & Hereditary Familial Essential Tremor   Current Outpatient Medications (Analgesics):    acetaminophen (TYLENOL) 500 MG tablet, Take 500 mg by mouth as directed. Take 2 tablets three times ad ay as needed   aspirin EC 81 MG tablet, Take 81 mg by mouth daily.   meloxicam (MOBIC) 15 MG tablet, Take one daily with food for 2 weeks, can take with tylenol, can not take with aleve, iburpofen, then as needed daily for pain   Current Outpatient Medications (Other):    ALPRAZolam (XANAX) 1 MG tablet, Take  1/2 to 1 tablet   Daily    ONLY  if needed for Severe  Anxiety Attack &  limit to 5 days/week to avoid Addiction and Dementia.   divalproex (DEPAKOTE) 250 MG DR tablet, Take 1 tablet 3 x /day for Hereditary tremor or "Shaking Palsy"   DULoxetine (CYMBALTA) 20 MG capsule, Take  1 capsule  Daily  for Chronic Pain   gabapentin (NEURONTIN) 100 MG capsule, TAKE ONE CAPSULE BY MOUTH THREE TIMES DAILY   gabapentin (NEURONTIN) 800 MG tablet, Take 1/2 to 1 tablet at Bedtime for Neuropathy & Sleep ( & continue 100 mg capsules 3 x /day )    hydrOXYzine (ATARAX) 25 MG tablet, Take 1 tablet 3 x /day fr Nerves /Anxiety or 1 to 2 tablets at Bedtime as needed for Sleep   Insulin Pen Needle (CAREFINE PEN NEEDLES) 32G X 4 MM MISC, Use 1x a day  Past Medical History:  Diagnosis Date   Anxiety    Anxiety disorder    Cirrhosis (HCC)    COPD (chronic obstructive pulmonary disease) ([place])    followed by pcp--  last exacerbation 11/ 2017   Coronary artery disease, non-occlusive 03/2018   Cardiac Cath: Moderate (60%) proximal-mid RCA (FFR 0.89 => NEGATIVE / not physiologically significant).  Normal EF and EDP.   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--  "gets real sharp stabbing pain over eye and speech screws up"  pt states takes excederin migraine and lies down (pt stated was told if she "if come to ER again they would send her to psych ward")   Hyperlipidemia    Hypertension    OA (osteoarthritis)    back, hips, knees, feet   Osteoporosis    Polyp of colon, hyperplastic    Pulmonary nodules  per CT chest 08-28-2016  -- bilateral upper lobe nodules   S/P right hip fracture 09/06/2019   Stroke [facility])    Tubular adenoma of colon    Type 2 diabetes mellitus ([place])    followed by pcp--  last A1c 9.1 on 08-01-2016   UTI (urinary tract infection)    Wears dentures      Allergies  Allergen Reactions   Actos [Pioglitazone] Other (See Comments)    Patient stated that it elevated her BGL   Glyburide Other (See Comments)    Patient stated that it elevated her BGL   Metformin And Related     "metformin caused mini-strokes" and ELEVATED BS and also GLYBURIDE, ONGLYZA, ACTOS, INVOKANA caused elevated BS   Onglyza [Saxagliptin] Other (See Comments)    Patient stated that it elevated her BGL   Rosuvastatin Diarrhea   Insulins Rash   Janumet [Sitagliptin-Metformin Hcl] Rash and Other (See Comments)    Shakes/ rash   Penicillins Rash and Other (See Comments)     ROS: all negative except above.   Physical Exam: Filed Weights   03/10/22 1557  Weight: 135 lb (61.2 kg)   Ht _0  (1.473 m)   Wt 135 lb (61.2 kg)   BMI 28.22 kg/m  General Appearance: Well nourished, in no apparent distress. Eyes: PERRLA, EOMs, conjunctiva no swelling or erythema Sinuses: No Frontal/maxillary tenderness ENT/Mouth: Ext aud canals clear, TMs without erythema, bulging. No erythema, swelling, or exudate on post pharynx.  Tonsils not swollen or erythematous. Hearing normal.  Neck: Supple, thyroid normal.  Respiratory: Respiratory effort normal, BS equal bilaterally without rales, rhonchi, wheezing or stridor.  Cardio: RRR with no MRGs. Brisk peripheral pulses without edema.  Abdomen: Soft, + BS.  Non tender, no guarding, rebound, hernias, masses. Lymphatics: Non tender without lymphadenopathy.  Musculoskeletal: Full ROM, 5/5 strength, normal gait.  Skin: Warm, dry without rashes, lesions, ecchymosis.  Neuro: Right side facial droop.  Right side weakness.  Dysarthria.  Normal muscle tone. Sensation intact.  Psych: Awake and oriented X 3, normal affect, Insight and Judgment appropriate.     Darrol Jump, NP 4:08 PM [facility]

## 2022-03-12 ENCOUNTER — Other Ambulatory Visit: Payer: Self-pay | Admitting: Nurse Practitioner

## 2022-03-12 LAB — URINALYSIS W MICROSCOPIC + REFLEX CULTURE
Bilirubin Urine: NEGATIVE
Ketones, ur: NEGATIVE
Nitrites, Initial: NEGATIVE
Specific Gravity, Urine: 1.009 (ref 1.001–1.035)
Squamous Epithelial / HPF: NONE SEEN /HPF (ref <=5)
pH: 6.5 (ref 5.0–8.0)

## 2022-03-12 LAB — COMPLETE METABOLIC PANEL WITH GFR
AG Ratio: 1.1 (calc) (ref 1.0–2.5)
ALT: 21 U/L (ref 6–29)
AST: 21 U/L (ref 10–35)
Albumin: 3 g/dL — ABNORMAL LOW (ref 3.6–5.1)
Alkaline phosphatase (APISO): 139 U/L (ref 37–153)
BUN: 14 mg/dL (ref 7–25)
CO2: 27 mmol/L (ref 20–32)
Calcium: 9.1 mg/dL (ref 8.6–10.4)
Chloride: 99 mmol/L (ref 98–110)
Creat: 0.88 mg/dL (ref 0.60–1.00)
Globulin: 2.8 g/dL (calc) (ref 1.9–3.7)
Glucose, Bld: 200 mg/dL — ABNORMAL HIGH (ref 65–99)
Potassium: 5.3 mmol/L (ref 3.5–5.3)
Sodium: 132 mmol/L — ABNORMAL LOW (ref 135–146)
Total Bilirubin: 0.3 mg/dL (ref 0.2–1.2)
Total Protein: 5.8 g/dL — ABNORMAL LOW (ref 6.1–8.1)
eGFR: 71 mL/min/{1.73_m2} (ref >=60)

## 2022-03-12 LAB — URINE CULTURE
MICRO NUMBER:: 13786633
SPECIMEN QUALITY:: ADEQUATE

## 2022-03-12 LAB — CULTURE INDICATED

## 2022-03-12 LAB — VITAMIN B12: Vitamin B-12: 462 pg/mL (ref 200–1100)

## 2022-03-12 LAB — MAGNESIUM: Magnesium: 1.9 mg/dL (ref 1.5–2.5)

## 2022-03-12 LAB — VITAMIN D 25 HYDROXY (VIT D DEFICIENCY, FRACTURES): Vit D, 25-Hydroxy: 7 ng/mL — ABNORMAL LOW (ref 30–100)

## 2022-03-12 MED ORDER — NITROFURANTOIN MONOHYD MACRO 100 MG PO CAPS: 100.0000 mg | ORAL_CAPSULE | Freq: Two times a day (BID) | ORAL | 0 refills | Status: AC

## 2022-03-18 ENCOUNTER — Encounter: Payer: PPO | Admitting: Adult Health

## 2022-04-14 ENCOUNTER — Encounter: Payer: PPO | Admitting: Nurse Practitioner

## 2022-04-21 ENCOUNTER — Other Ambulatory Visit: Payer: Self-pay | Admitting: Nurse Practitioner

## 2022-04-21 ENCOUNTER — Telehealth: Payer: Self-pay | Admitting: Nurse Practitioner

## 2022-04-21 MED ORDER — DOXYCYCLINE HYCLATE 100 MG PO CAPS: ORAL_CAPSULE | ORAL | 0 refills | Status: DC

## 2022-04-21 NOTE — Telephone Encounter (Signed)
Pt had a doxycycline prescription for some sores she was having, and they are back. Wanting a refill on them to go to [facility]

## 2022-04-30 ENCOUNTER — Encounter: Payer: PPO | Admitting: Nurse Practitioner

## 2022-06-01 NOTE — Progress Notes (Unsigned)
3 MONTH FOLLOW UP  Assessment:    Atherosclerosis of abdominal aorta (HCC) Control blood pressure, cholesterol, glucose, increase exercise.   CAD STOP SMOKING Follows with Cardiology, Dr Ellyn Hack. Control blood pressure, cholesterol, glucose, increase exercise.   Essential hypertension Monitor blood pressure at home; call if consistently over 130/80 Continue DASH diet.   Reminder to go to the ER if any CP, SOB, nausea, dizziness, severe HA, changes vision/speech, left arm numbness and tingling and jaw pain. 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 If hyperglycemia persists follow up in office with log Denies any barriers. Discussed dietary and exercise modifications. Schedule diabetes eye exam  -     glimepiride (AMARYL) 4 MG tablet; TAKE ONE TABLET BY MOUTH TWICE DAILY BEFORE LUNCH AND SUPPER -     Insulin Glargine (LANTUS SOLOSTAR) 100 UNIT/ML Solostar Pen; Inject 20-30 Units into the skin at bedtime.  -     Hemoglobin A1c  Liver cirrhosis secondary to NASH [facility]) Follows with GI for this. -     COMPLETE METABOLIC PANEL WITH GFR  Chronic obstructive pulmonary disease, unspecified COPD type ([place]) Denies sx; STOP smoking; monitor   Hyperlipidemia associated with type 2 diabetes mellitus (HCC)/Statin myopathy Statin for LDL goal <70 Discussed dietary and exercise modifications. -     Lipid panel  Tobacco use disorder Discussed smoking cessation.  Has reduced quantity.  Has been using gum to help.  Reports she has tried chantix, not ready to quit. Will continue to assess readiness. -lung cancer screening 06/2020, refuses another  Chronic hyponatremia Monitor;   -      COMPLETE METABOLIC PANEL WITH GFR  Anxiety Continue Cymbalta daily and Xanax 1 mg PRN,  Stress management techniques discussed, increase water, good sleep hygiene discussed, increase exercise, and increase veggies.    Vitamin D deficiency -    Defer, off of supplement; restart taking  Poor compliance with medication Discussed this at length with patient.   Tremor Continue Propranolol daily and Xanax PRN Strongly encouraged to stop smoking   Over 40 minutes of exam, counseling, chart review and critical decision making was performed Future Appointments  Date Time Provider [place]  06/02/2022  2:00 PM Alycia Rossetti, NP [facility] None  06/23/2022  4:00 PM Alycia Rossetti, NP [facility] None    Subjective:  Sara Lamb is a 71 y.o. MWF female who presents for 3 month follow up.  She has HTN, HLD, DMII with AA, NASH, Osteoporosis, Tobacco use, Vit D Defciency, COPD, chronic hyponatremia, anxiety and poor medication compliance.  She is very noncompliant with test, vaccines, and has an aversion to medication with " reactions" to the majority of medications.   She is s/p fall in Jan 2021 and fracture to her hip, continues to use walker, 1 step at home, can manage with handrail. Has handicap shower installed with rail. Denies falls since Jan. Completed PT. Follows with [facility], taking tylenol, gabapentin.   She is currently on Cymbalta for anxiety, depression and pain. She reports trazodone made her more irritated/kept her awake, endorses severe swelling with lexapro (reports attempted twice with similar results), has taken celexa, wellbutrin  She is noticing a worsening of tremors, currently on propranolol 20 mg BID. States the propranolol never really controlled her symptoms. She has noticed some worsening with the tremors when her blood sugars drop down below 80.  Also noted some numbness in right hand at that time  She has cirrhosis due to NASH, following  with GI Dr. Havery Moros, EGD 01/2017 showed no varices.   She has COPD by imaging, denies dyspnea or secretions, continues to smoke, reports has reduced from 2-2.5 to 1.5 packs recently. Last CT chest 06/2020 showed small nodules ~3.3 mm recommended 12 month follow up, emphysematous changes and aortic atherosclerosis.   BMI is There is no height or weight on file to calculate BMI., she has not been working on diet, reports does do daily stretch and strengthening exercises.  Wt Readings from Last 3 Encounters:  03/10/22 135 lb (61.2 kg)  03/07/22 139 lb (63 kg)  01/05/22 138 lb (62.6 kg)   Had cath 2019 showed Prox RCA to Mid RCA lesion is 60% stenosed. FFR negative- following with Dr. Ellyn Hack.  Had low risk perfusion test 06/2020.    Marland Kitchen Her blood pressure has been controlled at home, today their BP is    BP Readings from Last 3 Encounters:  03/10/22 (!) 142/64  03/08/22 (!) 139/53  01/05/22 130/78    She does not workout. She denies chest pain, shortness of breath, dizziness.   She has aortic atherosclerosis per CT 12/2016 She is not on cholesterol medication (prescribed rosuvastatin 20 mg MWF but admits not taking, is willing to restart) and denies myalgias. Her cholesterol is not at goal. The cholesterol last visit was:   Lab Results  Component Value Date   CHOL 246 (H) 01/05/2022   HDL 61 01/05/2022   LDLCALC 149 (H) 01/05/2022   TRIG 217 (H) 01/05/2022   CHOLHDL 4.0 01/05/2022   She has had diabetes for 20 years. She has been working on diet and exercise for diabetes, and denies hypoglycemia , nausea, paresthesia of the feet, polydipsia, polyuria, visual disturbances, vomiting and weight loss.  Hx of intolerance with metformin, actos, januvia, onglyza.  She is prescribed glimepiride 4 mg BID, lantus 23 units PM.  Fasting 91-130, most values are in low 100's. She is working on her diet, eating more salads with cheese, cucumber, tomatoes, [place] and boiled egg. Lab Results   Component Value Date   HGBA1C 7.0 (H) 01/05/2022   Last GFR: Lab Results  Component Value Date   GFRNONAA >60 03/07/2022   Patient is on Vitamin D supplement, was taking 1000 units three days a week, GI upset with higher doses, stopped taking recently  Lab Results  Component Value Date   VD25OH 7 (L) 03/10/2022      Medication Review: Current Outpatient Medications on File Prior to Visit  Medication Sig Dispense Refill   acetaminophen (TYLENOL) 500 MG tablet Take 500 mg by mouth as directed. Take 2 tablets three times ad ay as needed     ALPRAZolam (XANAX) 1 MG tablet Take  1/2 to 1 tablet   Daily    ONLY  if needed for Severe  Anxiety Attack &  limit to 5 days/week to avoid Addiction and Dementia. 30 tablet 0   aspirin EC 81 MG tablet Take 81 mg by mouth daily.     divalproex (DEPAKOTE) 250 MG DR tablet Take 1 tablet 3 x /day for Hereditary tremor or "Shaking Palsy" 270 tablet 1   doxycycline (VIBRAMYCIN) 100 MG capsule  Take 1 capsule 2 x/day with food for 2 weeks. 28 capsule 0   DULoxetine (CYMBALTA) 20 MG capsule Take  1 capsule  Daily  for Chronic Pain 90 capsule 3   gabapentin (NEURONTIN) 100 MG capsule TAKE ONE CAPSULE BY MOUTH THREE TIMES DAILY 270 capsule 1   gabapentin (NEURONTIN) 800 MG tablet Take 1/2 to 1 tablet at Bedtime for Neuropathy & Sleep ( & continue 100 mg capsules 3 x /day ) 90 tablet 3   glimepiride (AMARYL) 4 MG tablet TAKE ONE TABLET BY MOUTH TWICE DAILY with a meal FOR DIABETES 180 tablet 0   hydrOXYzine (ATARAX) 25 MG tablet Take 1 tablet 3 x /day fr Nerves /Anxiety or 1 to 2 tablets at Bedtime as needed for Sleep 270 tablet 0   Insulin Pen Needle (CAREFINE PEN NEEDLES) 32G X 4 MM MISC Use 1x a day 100 each 3   LANTUS SOLOSTAR 100 UNIT/ML Solostar Pen Inject 23 Units into the skin at bedtime. 15 mL 1   lisinopril (ZESTRIL) 2.5 MG tablet TAKE ONE TABLET BY MOUTH NIGHTLY FOR BLOOD PRESSURE 180 tablet 0   meloxicam (MOBIC) 15 MG tablet Take one daily with food  for 2 weeks, can take with tylenol, can not take with aleve, iburpofen, then as needed daily for pain 30 tablet 1   nadolol (CORGARD) 20 MG tablet Take 1 tablet Daily for BP & Hereditary Familial Essential Tremor 90 tablet 3   No current facility-administered medications on file prior to visit.    Allergies  Allergen Reactions   Actos [Pioglitazone] Other (See Comments)    Patient stated that it elevated her BGL   Glyburide Other (See Comments)    Patient stated that it elevated her BGL   Metformin And Related     "metformin caused mini-strokes" and ELEVATED BS and also GLYBURIDE, ONGLYZA, ACTOS, INVOKANA caused elevated BS   Onglyza [Saxagliptin] Other (See Comments)    Patient stated that it elevated her BGL   Rosuvastatin Diarrhea   Insulins Rash   Janumet [Sitagliptin-Metformin Hcl] Rash and Other (See Comments)    Shakes/ rash   Penicillins Rash and Other (See Comments)    Current Problems (verified) Patient Active Problem List   Diagnosis Date Noted   Rash and other nonspecific skin eruption 02/12/2021   CKD stage 2 due to type 2 diabetes mellitus ([place]) 02/11/2021   Overweight (BMI 25.0-29.9) 10/11/2020   Chest pain with low risk for cardiac etiology 06/24/2020   Type 2 diabetes mellitus with circulatory disorder ([place]) 11/24/2018   Chronic hyponatremia 03/18/2018   Coronary artery disease, non-occlusive: RCA 60%, FFR Negative. 02/16/2018   Cognitive dysfunction 03/27/2017   Liver cirrhosis secondary to NASH ([place]) 11/23/2016   Osteoporosis 05/27/2016   Atherosclerosis of abdominal aorta ([place]) - per CT 12/2016 04/30/2016   COPD (chronic obstructive pulmonary disease) ([place]) 04/30/2015   Tobacco use disorder 04/08/2015   Poor compliance with medication 11/05/2014   Vitamin D deficiency    Anxiety    Essential hypertension    Hyperlipidemia associated with type 2 diabetes mellitus ([place])     SURGICAL HISTORY She  has a past surgical history that includes Vaginal  hysterectomy (age 18); Bilateral salpingoophorectomy (1997); Knee arthroscopy (Right, 11/2014); Cholecystectomy (N/A, 09/30/2016); LEFT HEART CATH AND CORONARY ANGIOGRAPHY (N/A, 04/12/2018); INTRAVASCULAR PRESSURE WIRE/FFR STUDY (N/A, 04/12/2018); transthoracic echocardiogram (02/2018); NM MYOVIEW LTD (07/03/2020); and ORIF hip fracture (Right, 2021). FAMILY HISTORY Her family history includes Breast cancer (age of onset: 78) in her  sister; CAD in her maternal aunt, maternal uncle, and sister; CAD (age of onset: 63) in her mother; Cancer in her father; Coronary artery disease (age of onset: 78) in her brother; Diabetes in her brother, father, and mother; Heart attack in her maternal grandmother; Heart attack (age of onset: 74) in her sister; Hyperlipidemia in her brother, sister, and sister; Hypertension in her brother, father, and sister; Kidney Stones in her son; Stomach cancer in her mother; Stroke in her mother.  SOCIAL HISTORY She  reports that she has been smoking cigarettes. She started smoking about 54 years ago. She has a 106.00 pack-year smoking history. She has never used smokeless tobacco. She reports that she does not drink alcohol and does not use drugs.   Review of Systems  Constitutional:  Negative for malaise/fatigue and weight loss.  HENT:  Negative for hearing loss and tinnitus.   Eyes:  Negative for blurred vision and double vision.  Respiratory:  Positive for cough. Negative for sputum production, shortness of breath and wheezing.   Cardiovascular:  Negative for chest pain, palpitations, orthopnea, claudication, leg swelling and PND.  Gastrointestinal:  Negative for abdominal pain, blood in stool, constipation, diarrhea, heartburn, melena, nausea and vomiting.  Genitourinary: Negative.   Musculoskeletal:  Positive for joint pain (R hip and knees). Negative for falls and myalgias.  Skin:  Negative for rash.  Neurological:  Negative for dizziness, tingling, sensory change,  weakness and headaches.  Endo/Heme/Allergies:  Negative for polydipsia.  Psychiatric/Behavioral:  Negative for depression, memory loss, substance abuse and suicidal ideas. The patient is nervous/anxious. The patient does not have insomnia.   All other systems reviewed and are negative.    Objective:     There were no vitals filed for this visit.  There is no height or weight on file to calculate BMI.  General appearance: alert, no distress, WD/WN, female HEENT: normocephalic, sclerae anicteric, TMs pearly, nares patent, no discharge or erythema, pharynx normal Oral cavity: MMM, no lesions Neck: supple, no lymphadenopathy, no thyromegaly, no masses Heart: RRR, normal S1, S2, murmurs Lungs: CTA bilaterally, no wheezes, rhonchi, or rales Abdomen: +bs, soft, non tender, non distended, no masses, no hepatomegaly, no splenomegaly Musculoskeletal: no swelling, no obvious deformity, tender to light palpation to left lumbar. Extremities:1+ pitting edema of LE, no cyanosis Skin: Warm and dry, multiple ecchymoses on arms and legs Pulses: 2+ symmetric, upper and lower extremities, normal cap refill Neurological: alert, oriented x 3, CN2-12 intact, strength normal upper extremities and lower extremities, sensation normal throughout, DTRs 2+ throughout, gait slow steady with rolling walker Psychiatric: normal affect, behavior normal, pleasant     Alycia Rossetti, NP   06/01/2022

## 2022-06-02 ENCOUNTER — Encounter (HOSPITAL_BASED_OUTPATIENT_CLINIC_OR_DEPARTMENT_OTHER): Payer: Self-pay

## 2022-06-02 ENCOUNTER — Emergency Department (HOSPITAL_BASED_OUTPATIENT_CLINIC_OR_DEPARTMENT_OTHER): Payer: PPO | Admitting: Radiology

## 2022-06-02 ENCOUNTER — Encounter: Payer: Self-pay | Admitting: Nurse Practitioner

## 2022-06-02 ENCOUNTER — Other Ambulatory Visit: Payer: Self-pay

## 2022-06-02 ENCOUNTER — Ambulatory Visit (INDEPENDENT_AMBULATORY_CARE_PROVIDER_SITE_OTHER): Payer: PPO | Admitting: Nurse Practitioner

## 2022-06-02 VITALS — BP 144/60 | HR 77 | Temp 97.7°F | Ht <= 58 in | Wt 142.6 lb

## 2022-06-02 DIAGNOSIS — G4701 Insomnia due to medical condition: Secondary | ICD-10-CM

## 2022-06-02 DIAGNOSIS — E785 Hyperlipidemia, unspecified: Secondary | ICD-10-CM

## 2022-06-02 DIAGNOSIS — E871 Hypo-osmolality and hyponatremia: Secondary | ICD-10-CM

## 2022-06-02 DIAGNOSIS — E559 Vitamin D deficiency, unspecified: Secondary | ICD-10-CM

## 2022-06-02 DIAGNOSIS — Z91148 Patient's other noncompliance with medication regimen for other reason: Secondary | ICD-10-CM

## 2022-06-02 DIAGNOSIS — Z5321 Procedure and treatment not carried out due to patient leaving prior to being seen by health care provider: Secondary | ICD-10-CM | POA: Diagnosis not present

## 2022-06-02 DIAGNOSIS — E1169 Type 2 diabetes mellitus with other specified complication: Secondary | ICD-10-CM | POA: Diagnosis not present

## 2022-06-02 DIAGNOSIS — E1159 Type 2 diabetes mellitus with other circulatory complications: Secondary | ICD-10-CM

## 2022-06-02 DIAGNOSIS — M25562 Pain in left knee: Secondary | ICD-10-CM | POA: Diagnosis not present

## 2022-06-02 DIAGNOSIS — I700 Atherosclerosis of aorta: Secondary | ICD-10-CM

## 2022-06-02 DIAGNOSIS — Z79899 Other long term (current) drug therapy: Secondary | ICD-10-CM

## 2022-06-02 DIAGNOSIS — E1151 Type 2 diabetes mellitus with diabetic peripheral angiopathy without gangrene: Secondary | ICD-10-CM

## 2022-06-02 DIAGNOSIS — I10 Essential (primary) hypertension: Secondary | ICD-10-CM

## 2022-06-02 DIAGNOSIS — E1122 Type 2 diabetes mellitus with diabetic chronic kidney disease: Secondary | ICD-10-CM

## 2022-06-02 DIAGNOSIS — M7989 Other specified soft tissue disorders: Secondary | ICD-10-CM | POA: Diagnosis not present

## 2022-06-02 DIAGNOSIS — J449 Chronic obstructive pulmonary disease, unspecified: Secondary | ICD-10-CM | POA: Diagnosis not present

## 2022-06-02 DIAGNOSIS — R7309 Other abnormal glucose: Secondary | ICD-10-CM

## 2022-06-02 DIAGNOSIS — N182 Chronic kidney disease, stage 2 (mild): Secondary | ICD-10-CM

## 2022-06-02 DIAGNOSIS — K7460 Unspecified cirrhosis of liver: Secondary | ICD-10-CM

## 2022-06-02 DIAGNOSIS — M25552 Pain in left hip: Secondary | ICD-10-CM | POA: Diagnosis not present

## 2022-06-02 DIAGNOSIS — Z794 Long term (current) use of insulin: Secondary | ICD-10-CM

## 2022-06-02 DIAGNOSIS — R251 Tremor, unspecified: Secondary | ICD-10-CM

## 2022-06-02 DIAGNOSIS — F17200 Nicotine dependence, unspecified, uncomplicated: Secondary | ICD-10-CM | POA: Diagnosis not present

## 2022-06-02 DIAGNOSIS — L299 Pruritus, unspecified: Secondary | ICD-10-CM

## 2022-06-02 DIAGNOSIS — M818 Other osteoporosis without current pathological fracture: Secondary | ICD-10-CM | POA: Diagnosis not present

## 2022-06-02 MED ORDER — HYDROXYZINE HCL 25 MG PO TABS: ORAL_TABLET | ORAL | 0 refills | Status: DC

## 2022-06-02 MED ORDER — TRAZODONE HCL 50 MG PO TABS: ORAL_TABLET | ORAL | 3 refills | Status: DC

## 2022-06-02 MED ORDER — GLIMEPIRIDE 4 MG PO TABS: ORAL_TABLET | ORAL | 0 refills | Status: DC

## 2022-06-02 NOTE — Patient Instructions (Addendum)
If not given antibiotic at the ER notify the office and we will prescribe  [facility] [place] [phone]   Cellulitis, Adult  Cellulitis is a skin infection. The infected area is often warm, red, swollen, and sore. It occurs most often in the arms and lower legs. It is very important to get treated for this condition. What are the causes? This condition is caused by bacteria. The bacteria enter through a break in the skin, such as a cut, burn, insect bite, open sore, or crack. What increases the risk? This condition is more likely to occur in people who: Have a weak body defense system (immune system). Have open cuts, burns, bites, or scrapes on the skin. Are older than 71 years of age. Have a blood sugar problem (diabetes). Have a long-lasting (chronic) liver disease (cirrhosis) or kidney disease. Are very overweight (obese). Have a skin problem, such as: Itchy rash (eczema). Slow movement of blood in the veins (venous stasis). Fluid buildup below the skin (edema). Have been treated with high-energy rays (radiation). Use IV drugs. What are the signs or symptoms? Symptoms of this condition include: Skin that is: Red. Streaking. Spotting. Swollen. Sore or painful when you touch it. Warm. A fever. Chills. Blisters. How is this diagnosed? This condition is diagnosed based on: Medical history. Physical exam. Blood tests. Imaging tests. How is this treated? Treatment for this condition may include: Medicines to treat infections or allergies. Home care, such as: Rest. Placing cold or warm cloths (compresses) on the skin. Hospital care, if the condition is very bad. Follow these instructions at home: Medicines Take over-the-counter and prescription medicines only as told by your doctor. If you were prescribed an antibiotic medicine, take it as told by your doctor. Do not stop taking it even if you start to feel better. General  instructions  Drink enough fluid to keep your pee (urine) pale yellow. Do not touch or rub the infected area. Raise (elevate) the infected area above the level of your heart while you are sitting or lying down. Place cold or warm cloths on the area as told by your doctor. Keep all follow-up visits as told by your doctor. This is important. Contact a doctor if: You have a fever. You do not start to get better after 1-2 days of treatment. Your bone or joint under the infected area starts to hurt after the skin has healed. Your infection comes back. This can happen in the same area or another area. You have a swollen bump in the area. You have new symptoms. You feel ill and have muscle aches and pains. Get help right away if: Your symptoms get worse. You feel very sleepy. You throw up (vomit) or have watery poop (diarrhea) for a long time. You see red streaks coming from the area. Your red area gets larger. Your red area turns dark in color. These symptoms may represent a serious problem that is an emergency. Do not wait to see if the symptoms will go away. Get medical help right away. Call your local emergency services (911 in the [place] Do not drive yourself to the hospital. Summary Cellulitis is a skin infection. The area is often warm, red, swollen, and sore. This condition is treated with medicines, rest, and cold and warm cloths. Take all medicines only as told by your doctor. Tell your doctor if symptoms do not start to get better after 1-2 days of treatment. This information is not intended to replace  advice given to you by your health care provider. Make sure you discuss any questions you have with your health care provider. Document Revised: 04/23/2021 Document Reviewed: 04/24/2021 Elsevier Patient Education  [place].

## 2022-06-02 NOTE — ED Provider Triage Note (Signed)
Emergency Medicine Provider Triage Evaluation Note  Sara Lamb , a 71 y.o. female  was evaluated in triage.  Pt complains of L knee pain she states she fell onto oct 31st. No head injury or LOC no other pain. States some leg swelling but states this is not new and she is here for her knee pain. No SOB  Review of Systems  Positive: Knee pain left Negative: Fever   Physical Exam  BP (!) 154/117   Pulse 74   Temp 98 F (36.7 C)   Resp 17   Ht 4' 10"  (1.473 m)   Wt 64.4 kg   SpO2 100%   BMI 29.68 kg/m  Gen:   Awake, no distress   Resp:  Normal effort  MSK:   Moves extremities without difficulty  Other:  No crackles. Focal TTP of patella and L knee.   Medical Decision Making  Medically screening exam initiated at 6:01 PM.  Appropriate orders placed.  Sara Lamb was informed that the remainder of the evaluation will be completed by another provider, this initial triage assessment does not replace that evaluation, and the importance of remaining in the ED until their evaluation is complete.  Xray L knee and hip    Tedd Sias, [place] 06/02/22 1805

## 2022-06-02 NOTE — ED Triage Notes (Signed)
Pt reports almost falling onto L knee on halloween. Pt also reports BIL leg swelling.

## 2022-06-03 ENCOUNTER — Other Ambulatory Visit: Payer: Self-pay | Admitting: Nurse Practitioner

## 2022-06-03 ENCOUNTER — Telehealth: Payer: Self-pay | Admitting: Nurse Practitioner

## 2022-06-03 ENCOUNTER — Emergency Department (HOSPITAL_BASED_OUTPATIENT_CLINIC_OR_DEPARTMENT_OTHER)
Admission: EM | Admit: 2022-06-03 | Discharge: 2022-06-03 | Discharge status: Against Medical Advice | Payer: PPO | Attending: Emergency Medicine | Admitting: Emergency Medicine

## 2022-06-03 DIAGNOSIS — M25562 Pain in left knee: Secondary | ICD-10-CM

## 2022-06-03 DIAGNOSIS — L03116 Cellulitis of left lower limb: Secondary | ICD-10-CM

## 2022-06-03 MED ORDER — DOXYCYCLINE HYCLATE 100 MG PO TABS: 100.0000 mg | ORAL_TABLET | Freq: Two times a day (BID) | ORAL | 0 refills | Status: DC

## 2022-06-03 MED ORDER — TRAMADOL HCL 50 MG PO TABS: 50.0000 mg | ORAL_TABLET | Freq: Four times a day (QID) | ORAL | 0 refills | Status: DC | PRN

## 2022-06-03 NOTE — Telephone Encounter (Signed)
Advised patient of Tramadol rx. Patient states she will try to limit rx to once daily as needed for pain along with current regiment for pain.

## 2022-06-03 NOTE — Telephone Encounter (Signed)
Patient states that she was seen at [facility] ER for knee pain. That she was there until 11 p.m. last night. They gave her an x-ray but she doesn't know the results. She is in a lot of pain. She states that her paperwork states that if she wasn't give an antibiotic, that she needed to call her PCP to get one prescribed. I asked her why they didn't prescribe an antibiotic if that was their recommendation. She states that she never saw a Dr..Marland KitchenMarland Kitchen

## 2022-06-03 NOTE — Telephone Encounter (Signed)
I sent in Tramadol to use every 6 hours as needed, try to limit and use Tylenol in between

## 2022-06-03 NOTE — Telephone Encounter (Signed)
I am going to place her on Doxycycline 100 mg twice a day for 14 days, I would like to see her back in 1 week to reevaluate the knee. If she develops a fever, redness spreads or pain worsens she is to go back to the ER to be evaluated

## 2022-06-03 NOTE — Progress Notes (Signed)
PDMP is reviewed and appropriate She is needing pain medication for increased pain in her left knee due to cellulitis, had xray which showed no fractures. She had imaging at ER but left before evaluated by physician.  She is following up in 1 week or sooner if necessary.

## 2022-06-08 NOTE — Progress Notes (Unsigned)
Assessment and Plan:  There are no diagnoses linked to this encounter.    Further disposition pending results of labs. Discussed med's effects and SE's.   Over 30 minutes of exam, counseling, chart review, and critical decision making was performed.   Future Appointments  Date Time Provider [place]  06/09/2022 11:30 AM Alycia Rossetti, NP [facility] None  06/23/2022  4:00 PM Alycia Rossetti, NP [facility] None  07/02/2022  2:00 PM Alycia Rossetti, NP [facility] None  06/03/2023  2:00 PM Alycia Rossetti, NP [facility] None    ------------------------------------------------------------------------------------------------------------------   HPI There were no vitals taken for this visit. 71 y.o.female presents for  Past Medical History:  Diagnosis Date   Anxiety    Anxiety disorder    Cirrhosis ([place])    COPD (chronic obstructive pulmonary disease) ([place])    followed by pcp--  last exacerbation 11/ 2017   Coronary artery disease, non-occlusive 03/2018   Cardiac Cath: Moderate (60%) proximal-mid RCA (FFR 0.89 => NEGATIVE / not physiologically significant).  Normal EF and EDP.   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--  "gets real sharp stabbing pain over eye and speech screws up"  pt states takes excederin migraine and lies down (pt stated was told if she "if come to ER again they would send her to psych ward")   Hyperlipidemia    Hypertension    OA (osteoarthritis)    back, hips, knees, feet   Osteoporosis    Polyp of colon, hyperplastic    Pulmonary nodules    per CT chest 08-28-2016  -- bilateral upper lobe nodules   S/P right hip fracture 09/06/2019   Stroke [facility])    Tubular adenoma of colon    Type 2 diabetes mellitus ([place])    followed by pcp--  last A1c 9.1 on 08-01-2016   UTI (urinary tract infection)    Wears dentures      Allergies  Allergen Reactions   Actos  [Pioglitazone] Other (See Comments)    Patient stated that it elevated her BGL   Glyburide Other (See Comments)    Patient stated that it elevated her BGL   Metformin And Related     "metformin caused mini-strokes" and ELEVATED BS and also GLYBURIDE, ONGLYZA, ACTOS, INVOKANA caused elevated BS   Onglyza [Saxagliptin] Other (See Comments)    Patient stated that it elevated her BGL   Rosuvastatin Diarrhea   Insulins Rash   Janumet [Sitagliptin-Metformin Hcl] Rash and Other (See Comments)    Shakes/ rash   Penicillins Rash and Other (See Comments)    Current Outpatient Medications on File Prior to Visit  Medication Sig   acetaminophen (TYLENOL) 500 MG tablet Take 500 mg by mouth as directed. Take 2 tablets three times ad ay as needed   aspirin EC 81 MG tablet Take 81 mg by mouth daily.   divalproex (DEPAKOTE) 250 MG DR tablet Take 1 tablet 3 x /day for Hereditary tremor or "Shaking Palsy"   doxycycline (VIBRA-TABS) 100 MG tablet Take 1 tablet (100 mg total) by mouth 2 (two) times daily for 14 days.   DULoxetine (CYMBALTA) 20 MG capsule Take  1 capsule  Daily  for Chronic Pain   gabapentin (NEURONTIN) 100 MG capsule TAKE ONE CAPSULE BY MOUTH THREE TIMES DAILY   gabapentin (NEURONTIN) 800 MG tablet Take 1/2 to  1 tablet at Bedtime for Neuropathy & Sleep ( & continue 100 mg capsules 3 x /day )   glimepiride (AMARYL) 4 MG tablet TAKE ONE TABLET BY MOUTH TWICE DAILY with a meal FOR DIABETES   hydrOXYzine (ATARAX) 25 MG tablet Take 1 tablet 3 x /day fr Nerves /Anxiety or 1 to 2 tablets at Bedtime as needed for Sleep   Insulin Pen Needle (CAREFINE PEN NEEDLES) 32G X 4 MM MISC Use 1x a day   LANTUS SOLOSTAR 100 UNIT/ML Solostar Pen Inject 23 Units into the skin at bedtime.   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   traZODone (DESYREL) 50 MG tablet Take 1/2 to 1 tablet at Bedtime if needed for Sleep   No current facility-administered medications on file prior to visit.    ROS: all negative except above.   Physical Exam:  There were no vitals taken for this visit.  General Appearance: Well nourished, in no apparent distress. Eyes: PERRLA, EOMs, conjunctiva no swelling or erythema Sinuses: No Frontal/maxillary tenderness ENT/Mouth: Ext aud canals clear, TMs without erythema, bulging. No erythema, swelling, or exudate on post pharynx.  Tonsils not swollen or erythematous. Hearing normal.  Neck: Supple, thyroid normal.  Respiratory: Respiratory effort normal, BS equal bilaterally without rales, rhonchi, wheezing or stridor.  Cardio: RRR with no MRGs. Brisk peripheral pulses without edema.  Abdomen: Soft, + BS.  Non tender, no guarding, rebound, hernias, masses. Lymphatics: Non tender without lymphadenopathy.  Musculoskeletal: Full ROM, 5/5 strength, normal gait.  Skin: Warm, dry without rashes, lesions, ecchymosis.  Neuro: Cranial nerves intact. Normal muscle tone, no cerebellar symptoms. Sensation intact.  Psych: Awake and oriented X 3, normal affect, Insight and Judgment appropriate.     Alycia Rossetti, NP 12:59 PM [facility]

## 2022-06-09 ENCOUNTER — Emergency Department (HOSPITAL_BASED_OUTPATIENT_CLINIC_OR_DEPARTMENT_OTHER): Admit: 2022-06-09 | Discharge: 2022-06-09 | Disposition: A | Discharge status: Home / Self Care | Payer: PPO

## 2022-06-09 ENCOUNTER — Ambulatory Visit (INDEPENDENT_AMBULATORY_CARE_PROVIDER_SITE_OTHER): Payer: PPO | Admitting: Nurse Practitioner

## 2022-06-09 ENCOUNTER — Encounter (HOSPITAL_COMMUNITY): Payer: Self-pay

## 2022-06-09 ENCOUNTER — Other Ambulatory Visit: Payer: Self-pay

## 2022-06-09 ENCOUNTER — Encounter: Payer: Self-pay | Admitting: Nurse Practitioner

## 2022-06-09 ENCOUNTER — Emergency Department (HOSPITAL_COMMUNITY)
Admission: EM | Admit: 2022-06-09 | Discharge: 2022-06-10 | Disposition: A | Discharge status: Home / Self Care | Payer: PPO | Attending: Emergency Medicine | Admitting: Emergency Medicine

## 2022-06-09 VITALS — BP 160/80 | HR 84 | Temp 97.9°F | Resp 17 | Ht <= 58 in | Wt 147.0 lb

## 2022-06-09 DIAGNOSIS — Z7982 Long term (current) use of aspirin: Secondary | ICD-10-CM | POA: Diagnosis not present

## 2022-06-09 DIAGNOSIS — L03116 Cellulitis of left lower limb: Secondary | ICD-10-CM | POA: Insufficient documentation

## 2022-06-09 DIAGNOSIS — R52 Pain, unspecified: Secondary | ICD-10-CM

## 2022-06-09 DIAGNOSIS — E1165 Type 2 diabetes mellitus with hyperglycemia: Secondary | ICD-10-CM | POA: Insufficient documentation

## 2022-06-09 DIAGNOSIS — D72829 Elevated white blood cell count, unspecified: Secondary | ICD-10-CM | POA: Diagnosis not present

## 2022-06-09 DIAGNOSIS — Z794 Long term (current) use of insulin: Secondary | ICD-10-CM | POA: Diagnosis not present

## 2022-06-09 DIAGNOSIS — R600 Localized edema: Secondary | ICD-10-CM | POA: Diagnosis not present

## 2022-06-09 DIAGNOSIS — I872 Venous insufficiency (chronic) (peripheral): Secondary | ICD-10-CM | POA: Diagnosis not present

## 2022-06-09 DIAGNOSIS — R2242 Localized swelling, mass and lump, left lower limb: Secondary | ICD-10-CM | POA: Diagnosis present

## 2022-06-09 DIAGNOSIS — I10 Essential (primary) hypertension: Secondary | ICD-10-CM

## 2022-06-09 DIAGNOSIS — Z7984 Long term (current) use of oral hypoglycemic drugs: Secondary | ICD-10-CM | POA: Diagnosis not present

## 2022-06-09 DIAGNOSIS — M25562 Pain in left knee: Secondary | ICD-10-CM

## 2022-06-09 LAB — CBC WITH DIFFERENTIAL/PLATELET
Abs Immature Granulocytes: 0.07 10*3/uL (ref 0.00–0.07)
Basophils Absolute: 0.1 10*3/uL (ref 0.0–0.1)
Basophils Relative: 1 %
Eosinophils Absolute: 0.3 10*3/uL (ref 0.0–0.5)
Eosinophils Relative: 3 %
HCT: 40.3 % (ref 36.0–46.0)
Hemoglobin: 13.7 g/dL (ref 12.0–15.0)
Immature Granulocytes: 1 %
Lymphocytes Relative: 14 %
Lymphs Abs: 1.5 10*3/uL (ref 0.7–4.0)
MCH: 30.2 pg (ref 26.0–34.0)
MCHC: 34 g/dL (ref 30.0–36.0)
MCV: 88.8 fL (ref 80.0–100.0)
Monocytes Absolute: 0.6 10*3/uL (ref 0.1–1.0)
Monocytes Relative: 6 %
Neutro Abs: 8.5 10*3/uL — ABNORMAL HIGH (ref 1.7–7.7)
Neutrophils Relative %: 75 %
Platelets: 304 10*3/uL (ref 150–400)
RBC: 4.54 MIL/uL (ref 3.87–5.11)
RDW: 12 % (ref 11.5–15.5)
WBC: 11.1 10*3/uL — ABNORMAL HIGH (ref 4.0–10.5)
nRBC: 0 % (ref 0.0–0.2)

## 2022-06-09 LAB — COMPREHENSIVE METABOLIC PANEL
ALT: 13 U/L (ref 0–44)
AST: 19 U/L (ref 15–41)
Albumin: 2 g/dL — ABNORMAL LOW (ref 3.5–5.0)
Alkaline Phosphatase: 110 U/L (ref 38–126)
Anion gap: 5 (ref 5–15)
BUN: 18 mg/dL (ref 8–23)
CO2: 29 mmol/L (ref 22–32)
Calcium: 8.4 mg/dL — ABNORMAL LOW (ref 8.9–10.3)
Chloride: 98 mmol/L (ref 98–111)
Creatinine, Ser: 0.89 mg/dL (ref 0.44–1.00)
GFR, Estimated: >60 mL/min (ref >60)
Glucose, Bld: 241 mg/dL — ABNORMAL HIGH (ref 70–99)
Potassium: 4 mmol/L (ref 3.5–5.1)
Sodium: 132 mmol/L — ABNORMAL LOW (ref 135–145)
Total Bilirubin: 0.5 mg/dL (ref 0.3–1.2)
Total Protein: 5.8 g/dL — ABNORMAL LOW (ref 6.5–8.1)

## 2022-06-09 MED ORDER — DEXTROSE 5 % IV SOLN
1500.0000 mg | Freq: Once | INTRAVENOUS | Status: AC
  Administered 2022-06-09: 1500 mg via INTRAVENOUS
  Filled 2022-06-09: qty 75

## 2022-06-09 MED ORDER — HYDROCODONE-ACETAMINOPHEN 5-325 MG PO TABS: 1.0000 | ORAL_TABLET | ORAL | 0 refills | Status: DC | PRN

## 2022-06-09 MED ORDER — HYDROCODONE-ACETAMINOPHEN 5-325 MG PO TABS
1.0000 | ORAL_TABLET | Freq: Once | ORAL | Status: AC
  Administered 2022-06-09: 1 via ORAL
  Filled 2022-06-09: qty 1

## 2022-06-09 NOTE — ED Triage Notes (Signed)
Pt c/o left leg pain/swelling for over a week. Pt states her PCP told to come for a DVT rule out.

## 2022-06-09 NOTE — ED Provider Triage Note (Signed)
Emergency Medicine Provider Triage Evaluation Note  Sara Lamb , a 71 y.o. female  was evaluated in triage.  Pt complains of leg swelling and redness  Review of Systems  Positive: swelling  Negative: fever  Physical Exam  BP (!) 127/51   Pulse 83   Temp 97.9 F (36.6 C) (Oral)   Resp 18   SpO2 98%  Gen:   Awake, no distress   Resp:  Normal effort  MSK:   Moves extremities without difficulty  Other:    Medical Decision Making  Medically screening exam initiated at 1:43 PM.  Appropriate orders placed.  CAMELLIA POPESCU was informed that the remainder of the evaluation will be completed by another provider, this initial triage assessment does not replace that evaluation, and the importance of remaining in the ED until their evaluation is complete.      Fransico Meadow, [place] 06/09/22 1344

## 2022-06-09 NOTE — Progress Notes (Signed)
Left lower extremity venous duplex has been completed. Preliminary results can be found in CV Proc through chart review.  Results were given to [facility] PA.  06/09/22 2:16 PM Carlos Levering RVT

## 2022-06-09 NOTE — ED Provider Notes (Signed)
[place] DEPT Provider Note   CSN: [id number] Arrival date & time: 06/09/22  1310     History  Chief Complaint  Patient presents with   Left Leg Swelling    Sara Lamb is a 71 y.o. female.  HPI 71 year old female with a history of diabetes presents with left leg redness and pain.  She has been dealing with this for over a week.  Has been seeing her PCP and was put on doxycycline just under 1 week ago.  She has not had any fever.  Her legs have not been swollen besides chronic swelling.  She has chronic redness to her shins but that is not new or different.  Her knee does not hurt though it does hurt to bend her knee because of the pain he puts on the left lateral aspect of her leg.  Her doctor sent her here due to concern for possible DVT.  Home Medications Prior to Admission medications   Medication Sig Start Date End Date Taking? Authorizing Provider  HYDROcodone-acetaminophen (NORCO) 5-325 MG tablet Take 1 tablet by mouth every 4 (four) hours as needed. 06/09/22  Yes Sherwood Gambler, MD  acetaminophen (TYLENOL) 500 MG tablet Take 500 mg by mouth as directed. Take 2 tablets three times ad ay as needed    [provider]  aspirin EC 81 MG tablet Take 81 mg by mouth daily.    [provider]  divalproex (DEPAKOTE) 250 MG DR tablet Take 1 tablet 3 x /day for Hereditary tremor or "Shaking Palsy" 10/30/21   Unk Pinto, MD  doxycycline (VIBRA-TABS) 100 MG tablet Take 1 tablet (100 mg total) by mouth 2 (two) times daily for 14 days. 06/03/22 06/17/22  Alycia Rossetti, NP  DULoxetine (CYMBALTA) 20 MG capsule Take  1 capsule  Daily  for Chronic Pain 10/06/21   Unk Pinto, MD  gabapentin (NEURONTIN) 100 MG capsule TAKE ONE CAPSULE BY MOUTH THREE TIMES DAILY 09/01/21   Alycia Rossetti, NP  gabapentin (NEURONTIN) 800 MG tablet Take 1/2 to 1 tablet at Bedtime for Neuropathy & Sleep ( & continue 100 mg capsules 3 x /day ) 01/05/22    Unk Pinto, MD  glimepiride (AMARYL) 4 MG tablet TAKE ONE TABLET BY MOUTH TWICE DAILY with a meal FOR DIABETES 06/02/22   Alycia Rossetti, NP  hydrOXYzine (ATARAX) 25 MG tablet Take 1 tablet 3 x /day fr Nerves /Anxiety or 1 to 2 tablets at Bedtime as needed for Sleep 06/02/22   Alycia Rossetti, NP  Insulin Pen Needle (CAREFINE PEN NEEDLES) 32G X 4 MM MISC Use 1x a day 10/09/16   Philemon Kingdom, MD  LANTUS SOLOSTAR 100 UNIT/ML Solostar Pen Inject 23 Units into the skin at bedtime. 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  Cardiovascular:  Negative for leg swelling.  Musculoskeletal:  Positive  for myalgias.  Skin:  Positive for color change.    Physical Exam Updated Vital Signs BP (!) 184/70   Pulse 89   Temp 98.9 F (37.2 C) (Oral)   Resp 18   SpO2 99%  Physical Exam Vitals and nursing note reviewed.  Constitutional:      Appearance: She is well-developed.  HENT:     Head: Normocephalic and atraumatic.  Cardiovascular:     Rate and Rhythm: Normal rate and regular rhythm.     Pulses:          Dorsalis pedis pulses are 2+ on the left side.  Pulmonary:     Effort: Pulmonary effort is normal.  Musculoskeletal:     Left knee: Erythema present.     Right lower leg: Edema present.     Left lower leg: Edema present.       Legs:     Comments: Both lower legs are edematous  with mild stasis dermatitis.  Both knees have chronic decreased ROM.  Skin:    General: Skin is warm and dry.  Neurological:     Mental Status: She is alert.     ED Results / Procedures / Treatments   Labs (all labs ordered are listed, but only abnormal results are displayed) Labs Reviewed  CBC WITH DIFFERENTIAL/PLATELET - Abnormal; Notable for the following components:      Result Value   WBC 11.1 (*)    Neutro Abs 8.5 (*)    All other components within normal limits  COMPREHENSIVE METABOLIC PANEL - Abnormal; Notable for the following components:   Sodium 132 (*)    Glucose, Bld 241 (*)    Calcium 8.4 (*)    Total Protein 5.8 (*)    Albumin 2.0 (*)    All other components within normal limits    EKG None  Radiology VAS [place] LOWER EXTREMITY VENOUS (DVT) (ONLY [facility] & [facility])  Result Date: 06/09/2022  Lower Venous DVT Study Patient Name:  Sara Lamb Mckenney  Date of Exam:   06/09/2022 Medical Rec #: [id number]        Accession #:    [id number] Date of Birth: [birth year]        Patient Gender: F Patient Age:   84 years Exam Location:  [facility] Procedure:      VAS [place] LOWER EXTREMITY VENOUS (DVT) Referring Phys: LESLIE SOFIA --------------------------------------------------------------------------------  Indications: Pain.  Risk Factors: None identified. Limitations: Poor ultrasound/tissue interface, patient positioning, patient pain tolerance and body habitus. Comparison Study: No prior studies. Performing Technologist: Oliver Hum RVT  Examination Guidelines: A complete evaluation includes B-mode imaging, spectral Doppler, color Doppler, and power Doppler as needed of all accessible portions of each vessel. Bilateral testing is considered an integral part of a complete examination. Limited examinations for reoccurring indications may be performed as noted. The reflux portion of the exam is performed with the patient in reverse Trendelenburg.   +-----+---------------+---------+-----------+----------+--------------+ RIGHTCompressibilityPhasicitySpontaneityPropertiesThrombus Aging +-----+---------------+---------+-----------+----------+--------------+ CFV  Full           Yes      Yes                                 +-----+---------------+---------+-----------+----------+--------------+   +---------+---------------+---------+-----------+----------+-------------------+ LEFT     CompressibilityPhasicitySpontaneityPropertiesThrombus Aging      +---------+---------------+---------+-----------+----------+-------------------+ CFV      Full           Yes      Yes                                      +---------+---------------+---------+-----------+----------+-------------------+  SFJ      Full                                                             +---------+---------------+---------+-----------+----------+-------------------+ FV Prox  Full                                                             +---------+---------------+---------+-----------+----------+-------------------+ FV Mid                  Yes      Yes                                      +---------+---------------+---------+-----------+----------+-------------------+ FV Distal               Yes      Yes                                      +---------+---------------+---------+-----------+----------+-------------------+ PFV      Full                                                             +---------+---------------+---------+-----------+----------+-------------------+ POP      Full           Yes      Yes                                      +---------+---------------+---------+-----------+----------+-------------------+ PTV      Full                                                             +---------+---------------+---------+-----------+----------+-------------------+ PERO                                                   Patency shown with                                                        color doppler       +---------+---------------+---------+-----------+----------+-------------------+     *See table(s) above for measurements and observations.    Preliminary     Procedures Ultrasound ED Soft Tissue  Date/Time:  06/09/2022 11:16 PM  Performed by: Sherwood Gambler, MD Authorized by: Sherwood Gambler, MD   Procedure details:    Indications: evaluate for cellulitis     Transverse view:  Visualized   Longitudinal view:  Visualized   Images: archived   Location:    Location: lower extremity     Side:  Left Findings:     no abscess present    cellulitis present     Medications Ordered in ED Medications  dalbavancin (DALVANCE) 1,500 mg in dextrose 5 % 500 mL IVPB (has no administration in time range)  HYDROcodone-acetaminophen (NORCO/VICODIN) 5-325 MG per tablet 1 tablet (1 tablet Oral Given 06/09/22 2233)    ED Course/ Medical Decision Making/ A&P                           Medical Decision Making Amount and/or Complexity of Data Reviewed External Data Reviewed: notes. Labs:     Details: Mild leukocytosis.  Hyperglycemia without acidosis or AKI.  Risk Prescription drug management.   Patient presents with what appears to be left leg cellulitis.  She has some decreased range of motion in the left knee but is also present in the right knee and there is no apparent effusion.  I think septic arthritis is pretty unlikely.  It is not overlying the joint in general but is near her knee.  Ultrasound does not show any obvious fluid collection consistent with abscess.  She has been on doxycycline but with no improvement.  She does have pain but not pain out of proportion to make me suspect deep space infection.  She is not febrile or ill-appearing.  We will give pain control but I think given her history of diabetes and nonimprovement without need for emergent admission, dalbavancin  would be the best treatment from here.  We will give her an IV dose here and have her follow-up with ID and PCP.  We will give a short course of Norco for pain.        Final Clinical Impression(s) / ED Diagnoses Final diagnoses:  Left leg cellulitis    Rx / DC Orders ED Discharge Orders          Ordered    Ambulatory referral to Infectious Disease       Comments: Cellulitis patient:  Received dalbavancin on 06/09/2022.   06/09/22 2159    HYDROcodone-acetaminophen (NORCO) 5-325 MG tablet  Every 4 hours PRN        06/09/22 2231              Sherwood Gambler, MD 06/09/22 2317

## 2022-06-09 NOTE — Discharge Instructions (Addendum)
You were given a long acting IV antibiotic tonight (Dalvance). This will remain in your system and keep working for many days. STOP the Doxycycline. Follow up with your primary care physician, and you are also being referred to an Infectious Disease Specialist.   You are being prescribed Hydrocodone for pain. STOP the Tramadol. You may still take Ibuprofen and Tylenol.   If you develop fever, new or worsening pain, new or worsening redness, or any other new/concerning symptoms then return to the ER or call 911.

## 2022-06-10 ENCOUNTER — Telehealth: Payer: Self-pay

## 2022-06-10 NOTE — Telephone Encounter (Signed)
Patient called asking about taking her tylenol with her hydrocodone-acetaminophen 5/338m. I told her she did not need to take her normal dose of 10040mof tylenol since the hydrocodone has acetaminophen in it. She is going to monitor her pain level and [facility] then if her pain is getting worse.

## 2022-06-11 NOTE — Telephone Encounter (Signed)
Patient will call back if the pain gets worse.

## 2022-06-17 ENCOUNTER — Encounter: Payer: Self-pay | Admitting: Internal Medicine

## 2022-06-17 ENCOUNTER — Ambulatory Visit: Payer: PPO | Admitting: Internal Medicine

## 2022-06-17 ENCOUNTER — Other Ambulatory Visit: Payer: Self-pay

## 2022-06-17 VITALS — BP 149/82 | HR 77 | Temp 97.6°F | Wt 139.0 lb

## 2022-06-17 DIAGNOSIS — I878 Other specified disorders of veins: Secondary | ICD-10-CM | POA: Diagnosis not present

## 2022-06-17 DIAGNOSIS — K7460 Unspecified cirrhosis of liver: Secondary | ICD-10-CM | POA: Diagnosis not present

## 2022-06-17 DIAGNOSIS — L03116 Cellulitis of left lower limb: Secondary | ICD-10-CM | POA: Diagnosis not present

## 2022-06-17 MED ORDER — CEFADROXIL 500 MG PO CAPS: 1000.0000 mg | ORAL_CAPSULE | Freq: Two times a day (BID) | ORAL | 0 refills | Status: AC

## 2022-06-17 MED ORDER — DOXYCYCLINE HYCLATE 100 MG PO TABS: 100.0000 mg | ORAL_TABLET | Freq: Two times a day (BID) | ORAL | 0 refills | Status: AC

## 2022-06-17 NOTE — Progress Notes (Signed)
[facility]  Reason for Consult:cellulitis  Referring Provider: ed    Patient Active Problem List   Diagnosis Date Noted   Rash and other nonspecific skin eruption 02/12/2021   CKD stage 2 due to type 2 diabetes mellitus ([place]) 02/11/2021   Overweight (BMI 25.0-29.9) 10/11/2020   Chest pain with low risk for cardiac etiology 06/24/2020   Type 2 diabetes mellitus with circulatory disorder ([place]) 11/24/2018   Chronic hyponatremia 03/18/2018   Coronary artery disease, non-occlusive: RCA 60%, FFR Negative. 02/16/2018   Cognitive dysfunction 03/27/2017   Liver cirrhosis secondary to NASH ([place]) 11/23/2016   Osteoporosis 05/27/2016   Atherosclerosis of abdominal aorta ([place]) - per CT 12/2016 04/30/2016   COPD (chronic obstructive pulmonary disease) ([place]) 04/30/2015   Tobacco use disorder 04/08/2015   Poor compliance with medication 11/05/2014   Vitamin D deficiency    Anxiety    Essential hypertension    Hyperlipidemia associated with type 2 diabetes mellitus (HCC)       HPI: Sara Lamb is a 71 y.o. female dm2, cirrhosis, and chronic venous stasis referred by [facility] for cellulitis  She saw the ed on 11/14 for left lateral leg around the knee swelling/pain/redness. She was taking doxycycline before this without improvement  She received dalvance and now referred here  The redness is better and swelling as well but still warm and very painful still with difficulty flexing the knee  She has been following instruction to elevate legs and not put weight  No f/c  Feels well otherwise  Hx pcn allergy as a child but tolerate amox/ceftriaxone fine   Review of Systems: ROS  All other ros negative     Past Medical History:  Diagnosis Date   Anxiety    Anxiety disorder    Cirrhosis ([place])    COPD (chronic obstructive pulmonary disease) ([place])    followed by pcp--  last exacerbation 11/ 2017   Coronary artery disease, non-occlusive  03/2018   Cardiac Cath: Moderate (60%) proximal-mid RCA (FFR 0.89 => NEGATIVE / not physiologically significant).  Normal EF and EDP.   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--  "gets real sharp stabbing pain over eye and speech screws up"  pt states takes excederin migraine and lies down (pt stated was told if she "if come to ER again they would send her to psych ward")   Hyperlipidemia    Hypertension    OA (osteoarthritis)    back, hips, knees, feet   Osteoporosis    Polyp of colon, hyperplastic    Pulmonary nodules    per CT chest 08-28-2016  -- bilateral upper lobe nodules   S/P right hip fracture 09/06/2019   Stroke ([place])    Tubular adenoma of colon    Type 2 diabetes mellitus ([place])    followed by pcp--  last A1c 9.1 on 08-01-2016   UTI (urinary tract infection)    Wears dentures     Social History   Tobacco Use   Smoking status: Every Day    Packs/day: 2.00    Years: 53.00    Total pack years: 106.00    Types: Cigarettes    Start date: 1969   Smokeless tobacco: Never   Tobacco comments:    Has smoked up to 2.5 packs/day, currently 2  Vaping Use   Vaping Use: Never used  Substance Use Topics   Alcohol use: No    Alcohol/week: 0.0 standard drinks of alcohol   Drug use: No    Family History  Problem Relation Age of Onset   Diabetes Mother    Stroke Mother    Stomach cancer Mother    CAD Mother 52       PCI   Cancer Father        PROSTATE   Diabetes Father    Hypertension Father    Hyperlipidemia Sister    Hyperlipidemia Sister    Hypertension Sister    Heart attack Sister 57   CAD Sister    Breast cancer Sister 57   Hyperlipidemia Brother    Hypertension Brother    Diabetes Brother    Coronary artery disease Brother 23       CABG    Heart attack Maternal Grandmother    Kidney Stones Son    CAD Maternal Aunt    CAD Maternal Uncle    Colon cancer Neg Hx    Esophageal  cancer Neg Hx    Rectal cancer Neg Hx     Allergies  Allergen Reactions   Actos [Pioglitazone] Other (See Comments)    Patient stated that it elevated her BGL   Glyburide Other (See Comments)    Patient stated that it elevated her BGL   Metformin And Related     "metformin caused mini-strokes" and ELEVATED BS and also GLYBURIDE, ONGLYZA, ACTOS, INVOKANA caused elevated BS   Onglyza [Saxagliptin] Other (See Comments)    Patient stated that it elevated her BGL   Rosuvastatin Diarrhea   Insulins Rash   Janumet [Sitagliptin-Metformin Hcl] Rash and Other (See Comments)    Shakes/ rash   Penicillins Rash and Other (See Comments)    OBJECTIVE: Vitals:   06/17/22 1502  BP: (!) 149/82  Pulse: 77  Temp: 97.6 F (36.4 C)  TempSrc: Oral  SpO2: 94%  Weight: 139 lb (63 kg)   Body mass index is 29.05 kg/m.   Physical Exam General/constitutional: no distress, pleasant HEENT: Normocephalic, PER, Conj Clear, EOMI, Oropharynx clear Neck supple CV: rrr no mrg Lungs: clear to auscultation, normal respiratory effort Abd: Soft, Nontender Ext: bilateral le 2+ edema to thigh Skin/msk; appears to have decreased swelling based on wrinkling of skin left lateral knee, but still warm and exquisitely tender and mild erythema; no fluctuance; active rom overcomes pain and no joint effusion appreaciated Neuro: nonfocal  Lab: Lab Results  Component Value Date   WBC 11.1 (H) 06/09/2022   HGB 13.7 06/09/2022   HCT 40.3 06/09/2022   MCV 88.8 06/09/2022   PLT 304 65/99/3570   Last metabolic panel Lab Results  Component Value Date   GLUCOSE 241 (H) 06/09/2022   NA 132 (L) 06/09/2022   K 4.0 06/09/2022   CL 98 06/09/2022   CO2 29 06/09/2022   BUN 18 06/09/2022   CREATININE 0.89 06/09/2022   GFRNONAA >60 06/09/2022   CALCIUM 8.4 (L) 06/09/2022   PROT 5.8 (L) 06/09/2022   ALBUMIN 2.0 (L) 06/09/2022   BILITOT 0.5 06/09/2022   ALKPHOS 110 06/09/2022   AST 19 06/09/2022   ALT 13 06/09/2022    ANIONGAP 5 06/09/2022    Microbiology:  Serology:  Imaging:   Assessment/plan: Problem List Items Addressed This Visit   None Visit Diagnoses     Cellulitis of left lower extremity    -  Primary  Venous stasis       Hepatic cirrhosis, unspecified hepatic cirrhosis type, unspecified whether ascites present ([place])           Appears to be streptococcal process. I do not see any picture in beginning. Early staph process can present similarly and as there is still significant pain and some warmth/redness I will continue rx for her with doxy/cefadroxil 2 weeks  She has distant hx pcn allergy but had tolerated amox/ceftriaxone   F/u 1-2 weeks or sooner if increased sx  F/u pcp to get lymphedema clinic to do compression wrap of legs   I have spent a total of 65 minutes of face-to-face and non-face-to-face time, excluding clinical staff time, preparing to see patient, ordering tests and/or medications, and provide counseling the patient    Follow-up: Return in about 1 week (around 06/24/2022).  Jabier Mutton, [facility] 06/17/2022, 3:16 PM

## 2022-06-17 NOTE — Patient Instructions (Signed)
Let's continue 2 more weeks of oral antibiotics doxycycline and cefadroxil   Both are twice a day.   Make sure you drink plenty of water, stay upright 30 minute, and avoid vitamins, calcium, magnesium, iron, dairy products within 2 hours of taking doxycycline as they can prevent the absorption of doxycycline   Ask your primary care to get you to a lymphedema clinic or prescribe compression wrap (not stocking) to help treat the chronic swelling in your legs and prevent recurrent cellulitis

## 2022-06-23 ENCOUNTER — Encounter: Payer: Self-pay | Admitting: Nurse Practitioner

## 2022-06-23 ENCOUNTER — Ambulatory Visit (INDEPENDENT_AMBULATORY_CARE_PROVIDER_SITE_OTHER): Payer: PPO | Admitting: Nurse Practitioner

## 2022-06-23 VITALS — BP 150/78 | HR 77 | Temp 97.3°F | Ht <= 58 in | Wt 141.0 lb

## 2022-06-23 DIAGNOSIS — G250 Essential tremor: Secondary | ICD-10-CM

## 2022-06-23 DIAGNOSIS — I10 Essential (primary) hypertension: Secondary | ICD-10-CM

## 2022-06-23 DIAGNOSIS — K7460 Unspecified cirrhosis of liver: Secondary | ICD-10-CM

## 2022-06-23 DIAGNOSIS — E1159 Type 2 diabetes mellitus with other circulatory complications: Secondary | ICD-10-CM

## 2022-06-23 DIAGNOSIS — F419 Anxiety disorder, unspecified: Secondary | ICD-10-CM

## 2022-06-23 DIAGNOSIS — E871 Hypo-osmolality and hyponatremia: Secondary | ICD-10-CM | POA: Diagnosis not present

## 2022-06-23 DIAGNOSIS — N182 Chronic kidney disease, stage 2 (mild): Secondary | ICD-10-CM

## 2022-06-23 DIAGNOSIS — M818 Other osteoporosis without current pathological fracture: Secondary | ICD-10-CM | POA: Diagnosis not present

## 2022-06-23 DIAGNOSIS — Z0001 Encounter for general adult medical examination with abnormal findings: Secondary | ICD-10-CM | POA: Diagnosis not present

## 2022-06-23 DIAGNOSIS — E1122 Type 2 diabetes mellitus with diabetic chronic kidney disease: Secondary | ICD-10-CM

## 2022-06-23 DIAGNOSIS — E559 Vitamin D deficiency, unspecified: Secondary | ICD-10-CM | POA: Diagnosis not present

## 2022-06-23 DIAGNOSIS — R6889 Other general symptoms and signs: Secondary | ICD-10-CM | POA: Diagnosis not present

## 2022-06-23 DIAGNOSIS — J449 Chronic obstructive pulmonary disease, unspecified: Secondary | ICD-10-CM

## 2022-06-23 DIAGNOSIS — Z0000 Encounter for general adult medical examination without abnormal findings: Secondary | ICD-10-CM

## 2022-06-23 DIAGNOSIS — E1169 Type 2 diabetes mellitus with other specified complication: Secondary | ICD-10-CM | POA: Diagnosis not present

## 2022-06-23 DIAGNOSIS — Z79899 Other long term (current) drug therapy: Secondary | ICD-10-CM

## 2022-06-23 DIAGNOSIS — E785 Hyperlipidemia, unspecified: Secondary | ICD-10-CM

## 2022-06-23 DIAGNOSIS — F17200 Nicotine dependence, unspecified, uncomplicated: Secondary | ICD-10-CM | POA: Diagnosis not present

## 2022-06-23 DIAGNOSIS — I2510 Atherosclerotic heart disease of native coronary artery without angina pectoris: Secondary | ICD-10-CM

## 2022-06-23 DIAGNOSIS — R110 Nausea: Secondary | ICD-10-CM

## 2022-06-23 DIAGNOSIS — Z794 Long term (current) use of insulin: Secondary | ICD-10-CM

## 2022-06-23 DIAGNOSIS — Z91148 Patient's other noncompliance with medication regimen for other reason: Secondary | ICD-10-CM

## 2022-06-23 DIAGNOSIS — I700 Atherosclerosis of aorta: Secondary | ICD-10-CM

## 2022-06-23 DIAGNOSIS — K7581 Nonalcoholic steatohepatitis (NASH): Secondary | ICD-10-CM | POA: Diagnosis not present

## 2022-06-23 DIAGNOSIS — E1151 Type 2 diabetes mellitus with diabetic peripheral angiopathy without gangrene: Secondary | ICD-10-CM | POA: Diagnosis not present

## 2022-06-23 MED ORDER — ONDANSETRON HCL 4 MG PO TABS: 4.0000 mg | ORAL_TABLET | Freq: Every day | ORAL | 1 refills | Status: AC | PRN

## 2022-06-23 MED ORDER — DULOXETINE HCL 20 MG PO CPEP: ORAL_CAPSULE | ORAL | 3 refills | Status: DC

## 2022-06-23 MED ORDER — IPRATROPIUM-ALBUTEROL 0.5-2.5 (3) MG/3ML IN SOLN: 3.0000 mL | RESPIRATORY_TRACT | 3 refills | Status: AC | PRN

## 2022-06-23 MED ORDER — LANTUS SOLOSTAR 100 UNIT/ML ~~LOC~~ SOPN: 23.0000 [IU] | PEN_INJECTOR | Freq: Every day | SUBCUTANEOUS | 1 refills | Status: DC

## 2022-06-23 NOTE — Progress Notes (Signed)
MEDICARE ANNUAL WELLNESS VISIT AND 170monthFOLLOW UP  Assessment:   GDeoshawas seen today for medicare wellness.  Diagnoses and all orders for this visit: Has CPE  scheduled for next week will hold labs until then  Encounter for Medicare annual wellness exam Declines all vaccines, mammogram, colon cancer screening, DEXA follow up despite discussion of risks. She is requesting minimal interventions. She has also refused further low dose CT scans  Atherosclerosis of abdominal aorta (HCC) Control blood pressure, cholesterol, glucose, increase exercise.   CAD STOP SMOKING Continue medications: Aspirin 839m Lisinopril 70m52mnadolol 39m32mllows with Cardiology, Dr HardEllyn Hacktrol blood pressure, cholesterol, glucose, increase exercise.   Essential hypertension Monitor blood pressure at home; call if consistently over 130/80 Continue DASH diet.   Reminder to go to the ER if any CP, SOB, nausea, dizziness, severe HA, changes vision/speech, left arm numbness and tingling and jaw pain. 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 Don't take Glimepiride if blood sugar is less than 120 Long discussion of eating more regularly to help control blood sugars Discussed glucose monitoring at length, non-compliant with checking. Denies any barriers despite questioning. Discussed dietary and exercise modifications.  Tremor Continue nadolol and monitor symptoms  [facility] to [facility]H [facility].  Nausea Related to antibiotic use Zofran as needed  Chronic obstructive pulmonary disease, unspecified COPD type (HCC)[place] sx; STOP smoking; monitor   Hyperlipidemia associated with type 2 diabetes mellitus (HCC)/ Statin intolerance Had tried Rosuvastatin and claims it made her sick, refuses statin  medication Discussed dietary and exercise modifications. -     Lipid panel  Osteoporosis, unspecified osteoporosis type, unspecified pathological fracture presence Due for DEXA scan -states she had with her insurance company Discussed Calcium and Vit D supplementation  Tobacco use disorder Discussed smoking cessation.  Reports she is smoking 2 packs a day  She is not ready to quit -lung cancer screening with low dose CT discussed as recommended by guidelines based on age, number of pack year history.  Discussed risks of screening including but not limited to false positives on xray, further testing or consultation with specialist, and possible false negative CT as well. Refuses another CT scan.   Chronic hyponatremia Monitor;    Anxiety Stress management techniques discussed, increase water, good sleep hygiene discussed, increase exercise, and increase veggies.  Continue Alprazolam 1/2-1 tab 2-3 times a day. Limit to 5 days a week to avoid addiction and increased risk of dementia  Vitamin D deficiency Continue Vit D supplementation to maintain value in therapeutic level of 60-100   Poor compliance with medication Discussed this at length with patient.   Left leg pain/Streptococcal infection Continue antibiotics and follow with infectious disease  Edema Strongly encourages to keep legs elevated and use Ace wraps daily for compression Refused referral to lymphedema clinic  Over 40 minutes of exam, counseling, chart review and critical decision making was performed Future Appointments  Date Time Provider DepaSudley/11/2021  2:45 PM Vu, Jabier Mutton [facility]  07/02/2022  2:00 PM WilkAlycia Rossetti [facility] None  12/22/2022  4:00 PM WilkAlycia Rossetti [facility] None  06/03/2023  2:00 PM WilkAlycia Rossetti [facility] None     Plan:   During the course of  the visit the patient was educated and counseled about appropriate screening and preventive services  including:   Pneumococcal vaccine  Prevnar 13 Influenza vaccine Td vaccine Screening electrocardiogram Bone densitometry screening Colorectal cancer screening Diabetes screening Glaucoma screening Nutrition counseling  Advanced directives: requested   Subjective:  Sara Lamb is a 71 y.o. MWF female who presents for Medicare Annual Wellness Visit and 3 month follow up.  She has HTN, HLD, DMII with AA, NASH, Osteoporosis, Tobacco use, Vit D Defciency, COPD, chronic hyponatremia, anxiety and poor medication compliance.  She is very noncompliant with test, vaccines, and has an aversion to medication with " reactions" to the majority of medications.   She is on Doxycycline and Cefadroxil x 2 weeks and is following with infectious disease and returns to clinic on 06/30/22. Believes she has streptococcal process.  She is still having pain in her left leg, redness and warmth are improving. She is doing exercises given to her by doctor.  She is having nausea due to antibiotics.   She uses WD40 on her hip and states it helps the pain.   She is on cymbalta for pain and mood. Uses Hydroxyzine 50m  tab TID for nerves/anxiety. 1-2 tabs at bedtime for sleep. She also has trazodone  She has cirrhosis due to NASH, has RUQ pain, following with GI Dr. AHavery Moros EGD 01/2017, no varices.   She has COPD by imaging, denies dyspnea or secretions, continues to smoke, reports has reduced from 2  packs recently. CT Lung Cancer screen 07/23/20 benign appearance- aortic atherosclerosis and emphysema. She refuses another CT lung cancer screening  BMI is Body mass index is 29.47 kg/m., she has not been working on diet and exercise. She states she is not normally hungry, will only eat 1-2 meals a day. 1 is normally a salad Wt Readings from Last 3 Encounters:  06/23/22 141 lb (64 kg)  06/17/22 139 lb (63 kg)  06/09/22 147 lb (66.7 kg)   Had cath 2019 showed Prox RCA to Mid RCA lesion is 60% stenosed. FFR  negative- following with Dr. HEllyn Hack Myoview negative for ischemia . Her blood pressure has been controlled at home, today their BP is BP: (!) 150/78 BP Readings from Last 3 Encounters:  06/23/22 (!) 150/78  06/17/22 (!) 149/82  06/09/22 (!) 163/89    She does not workout. She denies shortness of breath, dizziness.   She has aortic atherosclerosis per CT 12/2016 She is not on cholesterol medication. Her cholesterol is at goal. Refuses statin medication. The cholesterol last visit was:   Lab Results  Component Value Date   CHOL 246 (H) 01/05/2022   HDL 61 01/05/2022   LDLCALC 149 (H) 01/05/2022   TRIG 217 (H) 01/05/2022   CHOLHDL 4.0 01/05/2022   She has had diabetes for 20 years. She has not been working on diet and exercise for diabetes, and denies hyperglycemia, hypoglycemia , nausea, paresthesia of the feet, polydipsia, polyuria, visual disturbances, vomiting and weight loss. She is prescribed glimepiride 4 mg BID, lantus 23 units PM. Fasting range 70-80's , has not been holding her glimepiride Lab Results  Component Value Date   HGBA1C 7.0 (H) 01/05/2022   Last GFR: Lab Results  Component Value Date   GFRNONAA >60 06/09/2022   Patient is on Vitamin D supplement, taking 1000 units three days a week, GI upset with higher doses.   Lab Results  Component Value Date   VD25OH 7 (L) 03/10/2022      Medication  Review: Current Outpatient Medications on File Prior to Visit  Medication Sig Dispense Refill   acetaminophen (TYLENOL) 500 MG tablet Take 500 mg by mouth as directed. Take 2 tablets three times ad ay as needed     aspirin EC 81 MG tablet Take 81 mg by mouth daily.     cefadroxil (DURICEF) 500 MG capsule Take 2 capsules (1,000 mg total) by mouth 2 (two) times daily for 14 days. 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 28 tablet 0   gabapentin (NEURONTIN) 100 MG capsule TAKE ONE CAPSULE BY MOUTH THREE TIMES DAILY 270 capsule 1   gabapentin (NEURONTIN) 800 MG tablet Take 1/2 to 1 tablet at Bedtime for Neuropathy & Sleep ( & continue 100 mg capsules 3 x /day ) 90 tablet 3   glimepiride (AMARYL) 4 MG tablet TAKE ONE TABLET BY MOUTH TWICE DAILY with a meal FOR DIABETES 180 tablet 0   HYDROcodone-acetaminophen (NORCO) 5-325 MG tablet Take 1 tablet by mouth every 4 (four) hours as needed. 10 tablet 0   hydrOXYzine (ATARAX) 25 MG tablet Take 1 tablet 3 x /day fr Nerves /Anxiety or 1 to 2 tablets at Bedtime as needed for Sleep 270 tablet 0   Insulin Pen Needle (CAREFINE PEN NEEDLES) 32G X 4 MM MISC Use 1x a day 100 each 3   lisinopril (ZESTRIL) 2.5 MG tablet TAKE ONE TABLET BY MOUTH NIGHTLY FOR BLOOD PRESSURE 180 tablet 0   nadolol (CORGARD) 20 MG tablet Take 1 tablet Daily for BP & Hereditary Familial Essential Tremor 90 tablet 3   traZODone (DESYREL) 50 MG tablet Take 1/2 to 1 tablet at Bedtime if needed for Sleep 90 tablet 3   No current facility-administered medications on file prior to visit.    Allergies  Allergen Reactions   Actos [Pioglitazone] Other (See Comments)    Patient stated that it elevated her BGL   Glyburide Other (See Comments)    Patient stated that it elevated her BGL   Metformin And Related     "metformin caused mini-strokes" and ELEVATED BS and also GLYBURIDE, ONGLYZA, ACTOS, INVOKANA caused elevated BS   Onglyza [Saxagliptin] Other (See Comments)    Patient stated that it elevated her BGL   Rosuvastatin Diarrhea   Insulins Rash   Janumet [Sitagliptin-Metformin Hcl] Rash and Other (See Comments)    Shakes/ rash   Penicillins Rash and Other (See Comments)    Current Problems (verified) Patient Active Problem List   Diagnosis Date Noted   Rash and other nonspecific skin eruption 02/12/2021   CKD stage 2 due to type 2 diabetes mellitus  ([place]) 02/11/2021   Overweight (BMI 25.0-29.9) 10/11/2020   Chest pain with low risk for cardiac etiology 06/24/2020   Type 2 diabetes mellitus with circulatory disorder ([place]) 11/24/2018   Chronic hyponatremia 03/18/2018   Coronary artery disease, non-occlusive: RCA 60%, FFR Negative. 02/16/2018   Cognitive dysfunction 03/27/2017   Liver cirrhosis secondary to NASH ([place]) 11/23/2016   Osteoporosis 05/27/2016   Atherosclerosis of abdominal aorta ([place]) - per CT 12/2016 04/30/2016   COPD (chronic obstructive pulmonary disease) ([place]) 04/30/2015   Tobacco use disorder 04/08/2015   Poor compliance with medication 11/05/2014   Vitamin D deficiency    Anxiety  Essential hypertension    Hyperlipidemia associated with type 2 diabetes mellitus ([place])     Screening Tests Immunization History  Administered Date(s) Administered   Hep A / Hep B 01/07/2017, 02/08/2017, 07/09/2017   Td 06/26/2001   Tdap 04/30/2015   Preventative care: Last colonoscopy: 12/2016, polyps, Dr. Havery Moros, 3 year follow up due , pt refuses EGD: 01/2017 gastritis, no varices  Last mammogram: 05/25/2016 OVERDUE - declines, would not pursue treatment if positive  Last pap smear/pelvic exam: remote, declines  DEXA: 04/2016 - R fem T -3.2, declines follow up at this time 05/27/21 had DEXA at home on finger -2 osteopenia  MRI brain 2016 CT chest 10/2017 IMPRESSION: 1. Multiple scattered millimetric peripheral pulmonary nodules are unchanged and considered benign. 2. Aortic atherosclerosis (ICD10-170.0). Three-vessel coronary artery calcification. 3. Cirrhosis.  Prior vaccinations: DECLINES ALL VACCINES TD or Tdap: 2016   Names of Other Physician/Practitioners you currently use: 1. [facility] here for primary care 2. none, eye doctor, encouraged to see eye doctor - she states will call Dr. Gilford Rile in [place]  07/06/22 is scheduled 3. Dentures dentist  Patient Care  Team: Unk Pinto, MD as PCP - General (Internal Medicine) Leonie Man, MD as PCP - Cardiology (Cardiology) Kinsinger, Arta Bruce, MD as Consulting Physician (General Surgery) Philemon Kingdom, MD as Consulting Physician (Internal Medicine) Armbruster, Carlota Raspberry, MD as Consulting Physician (Gastroenterology) [name], [facility] (Inactive) as Pharmacist (Pharmacist)  SURGICAL HISTORY She  has a past surgical history that includes Vaginal hysterectomy (age 85); Bilateral salpingoophorectomy (1997); Knee arthroscopy (Right, 11/2014); Cholecystectomy (N/A, 09/30/2016); LEFT HEART CATH AND CORONARY ANGIOGRAPHY (N/A, 04/12/2018); INTRAVASCULAR PRESSURE WIRE/FFR STUDY (N/A, 04/12/2018); transthoracic echocardiogram (02/2018); NM MYOVIEW LTD (07/03/2020); and ORIF hip fracture (Right, 2021). FAMILY HISTORY Her family history includes Breast cancer (age of onset: 80) in her sister; CAD in her maternal aunt, maternal uncle, and sister; CAD (age of onset: 47) in her mother; Cancer in her father; Coronary artery disease (age of onset: 43) in her brother; Diabetes in her brother, father, and mother; Heart attack in her maternal grandmother; Heart attack (age of onset: 62) in her sister; Hyperlipidemia in her brother, sister, and sister; Hypertension in her brother, father, and sister; Kidney Stones in her son; Stomach cancer in her mother; Stroke in her mother.  SOCIAL HISTORY She  reports that she has been smoking cigarettes. She started smoking about 54 years ago. She has a 106.00 pack-year smoking history. She has never used smokeless tobacco. She reports that she does not drink alcohol and does not use drugs.   MEDICARE WELLNESS OBJECTIVES: Physical activity: Current Exercise Habits: The patient does not participate in regular exercise at present, Exercise limited by: orthopedic condition(s) Cardiac risk factors: Cardiac Risk Factors include: advanced age (>45mn, >>46women);diabetes  mellitus;dyslipidemia;hypertension;sedentary lifestyle;smoking/ tobacco exposure Depression/mood screen:      06/23/2022    3:58 PM  Depression screen PHQ 2/9  Decreased Interest 1  Down, Depressed, Hopeless 1  PHQ - 2 Score 2  Altered sleeping 1  Tired, decreased energy 1  Change in appetite 1  Feeling bad or failure about yourself  2  Trouble concentrating 0  Moving slowly or fidgety/restless 0  Suicidal thoughts 0  PHQ-9 Score 7  Difficult doing work/chores Somewhat difficult    ADLs:     06/23/2022    4:00 PM  In your present state of health, do you have any difficulty performing the following activities:  Hearing? 0  Vision?  0  Difficulty concentrating or making decisions? 0  Walking or climbing stairs? 1  Dressing or bathing? 1  Doing errands, shopping? 1     Cognitive Testing  Alert? Yes  Normal Appearance?Yes  Oriented to person? Yes  Place? Yes   Time? Yes  Recall of three objects?  Yes  Can perform simple calculations? Yes  Displays appropriate judgment?Yes  Can read the correct time from a watch face?Yes  EOL planning: Does Patient Have a Medical Advance Directive?: Yes Type of Advance Directive: Healthcare Power of Attorney, Living will Does patient want to make changes to medical advance directive?: No - Patient declined Copy of [place] in Chart?: No - copy requested  Review of Systems  Constitutional:  Negative for malaise/fatigue and weight loss.  HENT:  Negative for hearing loss and tinnitus.   Eyes:  Negative for blurred vision and double vision.  Respiratory:  Negative for cough, sputum production, shortness of breath and wheezing.   Cardiovascular:  Negative for chest pain, palpitations, orthopnea, claudication, leg swelling and PND.  Gastrointestinal:  Negative for abdominal pain, blood in stool, constipation, diarrhea, heartburn, melena, nausea and vomiting.  Genitourinary: Negative.   Musculoskeletal:  Positive for falls  and joint pain (right hip). Negative for myalgias.       Pain in left knee  Skin:  Negative for rash.  Neurological:  Negative for dizziness, tingling, sensory change, weakness and headaches.  Endo/Heme/Allergies:  Negative for polydipsia.  Psychiatric/Behavioral:  Negative for depression, memory loss, substance abuse and suicidal ideas. The patient is nervous/anxious. The patient does not have insomnia.   All other systems reviewed and are negative.    Objective:     Today's Vitals   06/23/22 1521  BP: (!) 150/78  Pulse: 77  Temp: (!) 97.3 F (36.3 C)  SpO2: 95%  Weight: 141 lb (64 kg)  Height: 4' 10"  (1.473 m)   Body mass index is 29.47 kg/m.  General appearance: alert, no distress, WD/WN, female HEENT: normocephalic, sclerae anicteric, TMs pearly, nares patent, no discharge or erythema, pharynx normal Oral cavity: MMM, no lesions Neck: supple, no lymphadenopathy, no thyromegaly, no masses Heart: RRR, normal S1, S2, murmurs Lungs: CTA bilaterally, no wheezes, rhonchi, or rales Abdomen: +bs, soft, non tender, non distended, no masses, no hepatomegaly, no splenomegaly Musculoskeletal: no swelling, no obvious deformity Extremities: 2+ pitting edema of lower legs bilaterally, mild redness and warmth of left knee.  Pulses: diminished in lower extremities due to swelling Neurological: alert, oriented x 3, CN2-12 intact, strength normal upper extremities.  left lower extremity and right lower extremity has decreased strength, sensation normal throughout, DTRs 2+ throughout, 3+ patellar reflex, no cerebellar signs, Antalgic gait uses walker Psychiatric: normal affect, behavior normal, pleasant   Medicare Attestation I have personally reviewed: The patient's medical and social history Their use of alcohol, tobacco or illicit drugs Their current medications and supplements The patient's functional ability including ADLs,fall risks, home safety risks, cognitive, and hearing and  visual impairment Diet and physical activities Evidence for depression or mood disorders  The patient's weight, height, BMI, and visual acuity have been recorded in the chart.  I have made referrals, counseling, and provided education to the patient based on review of the above and I have provided the patient with a written personalized care plan for preventive services.     Alycia Rossetti, NP   06/23/2022

## 2022-06-23 NOTE — Patient Instructions (Signed)
If blood sugar is less than 120 do not take glimiperide Continue Lantus at night   Diabetes Mellitus and Nutrition, Adult When you have diabetes, or diabetes mellitus, it is very important to have healthy eating habits because your blood sugar (glucose) levels are greatly affected by what you eat and drink. Eating healthy foods in the right amounts, at about the same times every day, can help you: Manage your blood glucose. Lower your risk of heart disease. Improve your blood pressure. Reach or maintain a healthy weight. What can affect my meal plan? Every person with diabetes is different, and each person has different needs for a meal plan. Your health care provider may recommend that you work with a dietitian to make a meal plan that is best for you. Your meal plan may vary depending on factors such as: The calories you need. The medicines you take. Your weight. Your blood glucose, blood pressure, and cholesterol levels. Your activity level. Other health conditions you have, such as heart or kidney disease. How do carbohydrates affect me? Carbohydrates, also called carbs, affect your blood glucose level more than any other type of food. Eating carbs raises the amount of glucose in your blood. It is important to know how many carbs you can safely have in each meal. This is different for every person. Your dietitian can help you calculate how many carbs you should have at each meal and for each snack. How does alcohol affect me? Alcohol can cause a decrease in blood glucose (hypoglycemia), especially if you use insulin or take certain diabetes medicines by mouth. Hypoglycemia can be a life-threatening condition. Symptoms of hypoglycemia, such as sleepiness, dizziness, and confusion, are similar to symptoms of having too much alcohol. Do not drink alcohol if: Your health care provider tells you not to drink. You are pregnant, may be pregnant, or are planning to become pregnant. If you drink  alcohol: Limit how much you have to: 0-1 drink a day for women. 0-2 drinks a day for men. Know how much alcohol is in your drink. In the [place] one drink equals one 12 oz bottle of beer (355 mL), one 5 oz glass of wine (148 mL), or one 1 oz glass of hard liquor (44 mL). Keep yourself hydrated with water, diet soda, or unsweetened iced tea. Keep in mind that regular soda, juice, and other mixers may contain a lot of sugar and must be counted as carbs. What are tips for following this plan?  Reading food labels Start by checking the serving size on the Nutrition Facts label of packaged foods and drinks. The number of calories and the amount of carbs, fats, and other nutrients listed on the label are based on one serving of the item. Many items contain more than one serving per package. Check the total grams (g) of carbs in one serving. Check the number of grams of saturated fats and trans fats in one serving. Choose foods that have a low amount or none of these fats. Check the number of milligrams (mg) of salt (sodium) in one serving. Most people should limit total sodium intake to less than 2,300 mg per day. Always check the nutrition information of foods labeled as "low-fat" or "nonfat." These foods may be higher in added sugar or refined carbs and should be avoided. Talk to your dietitian to identify your daily goals for nutrients listed on the label. Shopping Avoid buying canned, pre-made, or processed foods. These foods tend to be high in fat,  sodium, and added sugar. Shop around the outside edge of the grocery store. This is where you will most often find fresh fruits and vegetables, bulk grains, fresh meats, and fresh dairy products. Cooking Use low-heat cooking methods, such as baking, instead of high-heat cooking methods, such as deep frying. Cook using healthy oils, such as olive, canola, or sunflower oil. Avoid cooking with butter, cream, or high-fat meats. Meal planning Eat meals and  snacks regularly, preferably at the same times every day. Avoid going long periods of time without eating. Eat foods that are high in fiber, such as fresh fruits, vegetables, beans, and whole grains. Eat 4-6 oz (112-168 g) of lean protein each day, such as lean meat, chicken, fish, eggs, or tofu. One ounce (oz) (28 g) of lean protein is equal to: 1 oz (28 g) of meat, chicken, or fish. 1 egg.  cup (62 g) of tofu. Eat some foods each day that contain healthy fats, such as avocado, nuts, seeds, and fish. What foods should I eat? Fruits Berries. Apples. Oranges. Peaches. Apricots. Plums. Grapes. Mangoes. Papayas. Pomegranates. Kiwi. Cherries. Vegetables Leafy greens, including lettuce, spinach, kale, chard, collard greens, mustard greens, and cabbage. Beets. Cauliflower. Broccoli. Carrots. Green beans. Tomatoes. Peppers. Onions. Cucumbers. Brussels sprouts. Grains Whole grains, such as whole-wheat or whole-grain bread, crackers, tortillas, cereal, and pasta. Unsweetened oatmeal. Quinoa. Brown or wild rice. Meats and other proteins Seafood. Poultry without skin. Lean cuts of poultry and beef. Tofu. Nuts. Seeds. Dairy Low-fat or fat-free dairy products such as milk, yogurt, and cheese. The items listed above may not be a complete list of foods and beverages you can eat and drink. Contact a dietitian for more information. What foods should I avoid? Fruits Fruits canned with syrup. Vegetables Canned vegetables. Frozen vegetables with butter or cream sauce. Grains Refined white flour and flour products such as bread, pasta, snack foods, and cereals. Avoid all processed foods. Meats and other proteins Fatty cuts of meat. Poultry with skin. Breaded or fried meats. Processed meat. Avoid saturated fats. Dairy Full-fat yogurt, cheese, or milk. Beverages Sweetened drinks, such as soda or iced tea. The items listed above may not be a complete list of foods and beverages you should avoid. Contact a  dietitian for more information. Questions to ask a health care provider Do I need to meet with a certified diabetes care and education specialist? Do I need to meet with a dietitian? What number can I call if I have questions? When are the best times to check my blood glucose? Where to find more information: American Diabetes Association: diabetes.org Academy of Nutrition and Dietetics: eatright.[employer] of Diabetes and Digestive and Kidney Diseases: [employer] Association of Diabetes Care & Education Specialists: diabeteseducator.org Summary It is important to have healthy eating habits because your blood sugar (glucose) levels are greatly affected by what you eat and drink. It is important to use alcohol carefully. A healthy meal plan will help you manage your blood glucose and lower your risk of heart disease. Your health care provider may recommend that you work with a dietitian to make a meal plan that is best for you. This information is not intended to replace advice given to you by your health care provider. Make sure you discuss any questions you have with your health care provider. Document Revised: 02/14/2020 Document Reviewed: 02/14/2020 Elsevier Patient Education  [place].

## 2022-06-24 ENCOUNTER — Telehealth: Payer: Self-pay

## 2022-06-24 NOTE — Telephone Encounter (Signed)
Ipratropium-Albuterol solution prior auth completed and submitted.

## 2022-06-25 NOTE — Telephone Encounter (Signed)
Medication approved

## 2022-06-30 ENCOUNTER — Ambulatory Visit (INDEPENDENT_AMBULATORY_CARE_PROVIDER_SITE_OTHER): Payer: PPO | Admitting: Internal Medicine

## 2022-06-30 ENCOUNTER — Encounter: Payer: Self-pay | Admitting: Internal Medicine

## 2022-06-30 ENCOUNTER — Other Ambulatory Visit: Payer: Self-pay

## 2022-06-30 VITALS — BP 181/91 | HR 58 | Temp 97.7°F | Wt 144.0 lb

## 2022-06-30 DIAGNOSIS — L03116 Cellulitis of left lower limb: Secondary | ICD-10-CM

## 2022-06-30 DIAGNOSIS — I878 Other specified disorders of veins: Secondary | ICD-10-CM

## 2022-06-30 MED ORDER — TRIAMCINOLONE ACETONIDE 0.5 % EX OINT: 1.0000 | TOPICAL_OINTMENT | Freq: Two times a day (BID) | CUTANEOUS | 1 refills | Status: AC

## 2022-06-30 NOTE — Patient Instructions (Signed)
Your cellulitis is gone. You can stop your antibiotics   Chronic leg swelling put you at risk for more infection. See your doctor regarding control of chronic leg swelling (have your regular doctor refer you to lymphedema clinic or show you how to compression wrap your legs  --- not stocking)  I will prescribe you kenalog (triamcinolone) cream -- you can try this if you have some redness/mild discomfort of your swollen legs area. If the redness is rapidly spreading or you have fever/chill then it is cellulitis and you'll need antibiotics for   With regard to your weakness, you need to start working on legs/hips strengthening exercise. Ask your regular doctor to prescribe/refer you to physical therapy. In the mean time, you can consider getting a elastic band to start exercising your legs/hips   No need to follow up with our id clinic.  See your regular doctor in follow up for ongoing issue with strength, leg swelling

## 2022-06-30 NOTE — Progress Notes (Signed)
[name]   Patient Active Problem List   Diagnosis Date Noted   Rash and other nonspecific skin eruption 02/12/2021   CKD stage 2 due to type 2 diabetes mellitus ([place]) 02/11/2021   Overweight (BMI 25.0-29.9) 10/11/2020   Chest pain with low risk for cardiac etiology 06/24/2020   Type 2 diabetes mellitus with circulatory disorder ([place]) 11/24/2018   Chronic hyponatremia 03/18/2018   Coronary artery disease, non-occlusive: RCA 60%, FFR Negative. 02/16/2018   Cognitive dysfunction 03/27/2017   Liver cirrhosis secondary to NASH ([place]) 11/23/2016   Osteoporosis 05/27/2016   Atherosclerosis of abdominal aorta ([place]) - per CT 12/2016 04/30/2016   COPD (chronic obstructive pulmonary disease) ([place]) 04/30/2015   Tobacco use disorder 04/08/2015   Poor compliance with medication 11/05/2014   Vitamin D deficiency    Anxiety    Essential hypertension    Hyperlipidemia associated with type 2 diabetes mellitus ([place])    Cc - f/u cellulitis left LE   HPI: Sara Lamb is a 71 y.o. female dm2, cirrhosis, and chronic venous stasis referred by [facility] for cellulitis  06/30/22 id f/u Pain gone Weak bilateral legs Chronic swelling stable Feels well Still taking doxy/cefadroxil She had started and still do stretching exercise for left knee and the stiffness had resolved  ----- Initial encounter 11/22: She saw the ed on 11/14 for left lateral leg around the knee swelling/pain/redness. She was taking doxycycline before this without improvement  She received dalvance and now referred here  The redness is better and swelling as well but still warm and very painful still with difficulty flexing the knee  She has been following instruction to elevate legs and not put weight  No f/c  Feels well otherwise  Hx pcn allergy as a child but tolerate amox/ceftriaxone fine   Review of Systems: ROS  All other ros negative     Past Medical History:   Diagnosis Date   Anxiety    Anxiety disorder    Cirrhosis ([place])    COPD (chronic obstructive pulmonary disease) ([place])    followed by pcp--  last exacerbation 11/ 2017   Coronary artery disease, non-occlusive 03/2018   Cardiac Cath: Moderate (60%) proximal-mid RCA (FFR 0.89 => NEGATIVE / not physiologically significant).  Normal EF and EDP.   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--  "gets real sharp stabbing pain over eye and speech screws up"  pt states takes excederin migraine and lies down (pt stated was told if she "if come to ER again they would send her to psych ward")   Hyperlipidemia    Hypertension    OA (osteoarthritis)    back, hips, knees, feet   Osteoporosis    Polyp of colon, hyperplastic    Pulmonary nodules    per CT chest 08-28-2016  -- bilateral upper lobe nodules   S/P right hip fracture 09/06/2019   Stroke ([place])    Tubular adenoma of colon    Type 2 diabetes mellitus ([place])    followed by pcp--  last A1c 9.1 on 08-01-2016   UTI (urinary tract infection)    Wears dentures     Social History   Tobacco Use   Smoking status: Every Day    Packs/day: 2.00    Years: 53.00    Total pack  years: 106.00    Types: Cigarettes    Start date: 1969   Smokeless tobacco: Never   Tobacco comments:    Has smoked up to 2.5 packs/day, currently 2  Vaping Use   Vaping Use: Never used  Substance Use Topics   Alcohol use: No    Alcohol/week: 0.0 standard drinks of alcohol   Drug use: No    Family History  Problem Relation Age of Onset   Diabetes Mother    Stroke Mother    Stomach cancer Mother    CAD Mother 30       PCI   Cancer Father        PROSTATE   Diabetes Father    Hypertension Father    Hyperlipidemia Sister    Hyperlipidemia Sister    Hypertension Sister    Heart attack Sister 54   CAD Sister    Breast cancer Sister 93   Hyperlipidemia Brother    Hypertension Brother     Diabetes Brother    Coronary artery disease Brother 35       CABG    Heart attack Maternal Grandmother    Kidney Stones Son    CAD Maternal Aunt    CAD Maternal Uncle    Colon cancer Neg Hx    Esophageal cancer Neg Hx    Rectal cancer Neg Hx     Allergies  Allergen Reactions   Actos [Pioglitazone] Other (See Comments)    Patient stated that it elevated her BGL   Glyburide Other (See Comments)    Patient stated that it elevated her BGL   Metformin And Related     "metformin caused mini-strokes" and ELEVATED BS and also GLYBURIDE, ONGLYZA, ACTOS, INVOKANA caused elevated BS   Onglyza [Saxagliptin] Other (See Comments)    Patient stated that it elevated her BGL   Rosuvastatin Diarrhea   Insulins Rash   Janumet [Sitagliptin-Metformin Hcl] Rash and Other (See Comments)    Shakes/ rash   Penicillins Rash and Other (See Comments)    OBJECTIVE: Vitals:   06/30/22 1428  BP: (!) 171/84  Pulse: (!) 58  Temp: 97.7 F (36.5 C)  TempSrc: Oral  SpO2: 100%  Weight: 144 lb (65.3 kg)   Body mass index is 30.1 kg/m.   Physical Exam General/constitutional: no distress, pleasant; walks with fww HEENT: Normocephalic, PER, Conj Clear, EOMI, Oropharynx clear Neck supple CV: rrr no mrg Lungs: clear to auscultation, normal respiratory effort Abd: Soft, Nontender Ext: 2+ edema to thighs bilaterally symmetric Skin: No Rash except spotty mild erythema bilateral distal LE -- nontender and no warmth bilateral knees Neuro: nonfocal outside of 4/5 proximal muscle bilateral LE (used arms to push up to stand from sitting) and also use arms to lift legs up in sitting position beyond 100 degrees MSK: no peripheral joint swelling/tenderness/warmth; back spines nontender     Lab: Lab Results  Component Value Date   WBC 11.1 (H) 06/09/2022   HGB 13.7 06/09/2022   HCT 40.3 06/09/2022   MCV 88.8 06/09/2022   PLT 304 87/56/4332   Last metabolic panel Lab Results  Component Value Date    GLUCOSE 241 (H) 06/09/2022   NA 132 (L) 06/09/2022   K 4.0 06/09/2022   CL 98 06/09/2022   CO2 29 06/09/2022   BUN 18 06/09/2022   CREATININE 0.89 06/09/2022   GFRNONAA >60 06/09/2022   CALCIUM 8.4 (L) 06/09/2022   PROT 5.8 (L) 06/09/2022   ALBUMIN 2.0 (L) 06/09/2022  BILITOT 0.5 06/09/2022   ALKPHOS 110 06/09/2022   AST 19 06/09/2022   ALT 13 06/09/2022   ANIONGAP 5 06/09/2022    Microbiology:  Serology:  Imaging:   Assessment/plan: Problem List Items Addressed This Visit   None Visit Diagnoses     Venous stasis    -  Primary   Cellulitis of left lower extremity          Appears to be streptococcal process. I do not see any picture in beginning. Early staph process can present similarly and as there is still significant pain and some warmth/redness I will continue rx for her with doxy/cefadroxil 2 weeks  She has distant hx pcn allergy but had tolerated amox/ceftriaxone   F/u 1-2 weeks or sooner if increased sx  F/u pcp to get lymphedema clinic to do compression wrap of legs  ------------ 06/30/22 id assessment Cellulitis resolved She has proximal leg weakness Advise resistance band strengthening for LE but also advise to see pcp to refer to pt/ot Advise to discuss referral to lymphedema clinic or start compression wrap for bilateral LE edema to reduce risk recurrent cellulitis Kenalog 0.5 % ointment rx'ed to use when potential venous stasis dermatitis occurs (discuss this vs cellulitis difference)  No need to f/u ID clinic  Follow-up: No follow-ups on file.  Jabier Mutton, [facility] 06/30/2022, 2:34 PM

## 2022-07-02 ENCOUNTER — Ambulatory Visit (INDEPENDENT_AMBULATORY_CARE_PROVIDER_SITE_OTHER): Payer: PPO | Admitting: Nurse Practitioner

## 2022-07-02 ENCOUNTER — Encounter: Payer: Self-pay | Admitting: Nurse Practitioner

## 2022-07-02 VITALS — BP 170/70 | HR 65 | Temp 97.9°F | Resp 18 | Ht <= 58 in | Wt 146.2 lb

## 2022-07-02 DIAGNOSIS — Z0000 Encounter for general adult medical examination without abnormal findings: Secondary | ICD-10-CM

## 2022-07-02 DIAGNOSIS — Z79899 Other long term (current) drug therapy: Secondary | ICD-10-CM | POA: Diagnosis not present

## 2022-07-02 DIAGNOSIS — Z136 Encounter for screening for cardiovascular disorders: Secondary | ICD-10-CM

## 2022-07-02 DIAGNOSIS — R0600 Dyspnea, unspecified: Secondary | ICD-10-CM

## 2022-07-02 DIAGNOSIS — E785 Hyperlipidemia, unspecified: Secondary | ICD-10-CM | POA: Diagnosis not present

## 2022-07-02 DIAGNOSIS — Z794 Long term (current) use of insulin: Secondary | ICD-10-CM | POA: Diagnosis not present

## 2022-07-02 DIAGNOSIS — Z91148 Patient's other noncompliance with medication regimen for other reason: Secondary | ICD-10-CM

## 2022-07-02 DIAGNOSIS — E559 Vitamin D deficiency, unspecified: Secondary | ICD-10-CM

## 2022-07-02 DIAGNOSIS — Z0001 Encounter for general adult medical examination with abnormal findings: Secondary | ICD-10-CM

## 2022-07-02 DIAGNOSIS — R600 Localized edema: Secondary | ICD-10-CM

## 2022-07-02 DIAGNOSIS — E1159 Type 2 diabetes mellitus with other circulatory complications: Secondary | ICD-10-CM

## 2022-07-02 DIAGNOSIS — E1169 Type 2 diabetes mellitus with other specified complication: Secondary | ICD-10-CM

## 2022-07-02 DIAGNOSIS — J449 Chronic obstructive pulmonary disease, unspecified: Secondary | ICD-10-CM

## 2022-07-02 DIAGNOSIS — I700 Atherosclerosis of aorta: Secondary | ICD-10-CM

## 2022-07-02 DIAGNOSIS — E1151 Type 2 diabetes mellitus with diabetic peripheral angiopathy without gangrene: Secondary | ICD-10-CM

## 2022-07-02 DIAGNOSIS — I10 Essential (primary) hypertension: Secondary | ICD-10-CM | POA: Diagnosis not present

## 2022-07-02 DIAGNOSIS — N182 Chronic kidney disease, stage 2 (mild): Secondary | ICD-10-CM | POA: Diagnosis not present

## 2022-07-02 DIAGNOSIS — K7460 Unspecified cirrhosis of liver: Secondary | ICD-10-CM

## 2022-07-02 DIAGNOSIS — M818 Other osteoporosis without current pathological fracture: Secondary | ICD-10-CM

## 2022-07-02 DIAGNOSIS — Z1329 Encounter for screening for other suspected endocrine disorder: Secondary | ICD-10-CM | POA: Diagnosis not present

## 2022-07-02 DIAGNOSIS — E1122 Type 2 diabetes mellitus with diabetic chronic kidney disease: Secondary | ICD-10-CM | POA: Diagnosis not present

## 2022-07-02 DIAGNOSIS — F17200 Nicotine dependence, unspecified, uncomplicated: Secondary | ICD-10-CM

## 2022-07-02 DIAGNOSIS — F419 Anxiety disorder, unspecified: Secondary | ICD-10-CM

## 2022-07-02 DIAGNOSIS — E871 Hypo-osmolality and hyponatremia: Secondary | ICD-10-CM

## 2022-07-02 MED ORDER — FUROSEMIDE 40 MG PO TABS: ORAL_TABLET | ORAL | 11 refills | Status: DC

## 2022-07-02 NOTE — Patient Instructions (Addendum)
Wrap legs firmly with ACE wraps at the beginning of each day and take off at bedtime Take 1 tab of Furosemide daily until evaluated by cardiology Go to the ER if any chest pain, shortness of breath, nausea, dizziness, severe HA, changes vision/speech  Peripheral Edema  Peripheral edema is swelling that is caused by a buildup of fluid. Peripheral edema most often affects the lower legs, ankles, and feet. It can also develop in the arms, hands, and face. The area of the body that has peripheral edema will look swollen. It may also feel heavy or warm. Your clothes may start to feel tight. Pressing on the area may make a temporary dent in your skin (pitting edema). You may not be able to move your swollen arm or leg as much as usual. There are many causes of peripheral edema. It can happen because of a complication of other conditions such as heart failure, kidney disease, or a problem with your circulation. It also can be a side effect of certain medicines or happen because of an infection. It often happens to women during pregnancy. Sometimes, the cause is not known. Follow these instructions at home: Managing pain, stiffness, and swelling  Raise (elevate) your legs while you are sitting or lying down. Move around often to prevent stiffness and to reduce swelling. Do not sit or stand for long periods of time. Do not wear tight clothing. Do not wear garters on your upper legs. Exercise your legs to get your circulation going. This helps to move the fluid back into your blood vessels, and it may help the swelling go down. Wear compression stockings as told by your health care provider. These stockings help to prevent blood clots and reduce swelling in your legs. It is important that these are the correct size. These stockings should be prescribed by your doctor to prevent possible injuries. If elastic bandages or wraps are recommended, use them as told by your health care provider. Medicines Take  over-the-counter and prescription medicines only as told by your health care provider. Your health care provider may prescribe medicine to help your body get rid of excess water (diuretic). Take this medicine if you are told to take it. General instructions Eat a low-salt (low-sodium) diet as told by your health care provider. Sometimes, eating less salt may reduce swelling. Pay attention to any changes in your symptoms. Moisturize your skin daily to help prevent skin from cracking and draining. Keep all follow-up visits. This is important. Contact a health care provider if: You have a fever. You have swelling in only one leg. You have increased swelling, redness, or pain in one or both of your legs. You have drainage or sores at the area where you have edema. Get help right away if: You have edema that starts suddenly or is getting worse, especially if you are pregnant or have a medical condition. You develop shortness of breath, especially when you are lying down. You have pain in your chest or abdomen. You feel weak. You feel like you will faint. These symptoms may be an emergency. Get help right away. Call 911. Do not wait to see if the symptoms will go away. Do not drive yourself to the hospital. Summary Peripheral edema is swelling that is caused by a buildup of fluid. Peripheral edema most often affects the lower legs, ankles, and feet. Move around often to prevent stiffness and to reduce swelling. Do not sit or stand for long periods of time. Pay attention to  any changes in your symptoms. Contact a health care provider if you have edema that starts suddenly or is getting worse, especially if you are pregnant or have a medical condition. Get help right away if you develop shortness of breath, especially when lying down. This information is not intended to replace advice given to you by your health care provider. Make sure you discuss any questions you have with your health care  provider. Document Revised: 03/17/2021 Document Reviewed: 03/17/2021 Elsevier Patient Education  [place].

## 2022-07-02 NOTE — Progress Notes (Signed)
CPE  Assessment:   Sara Lamb was seen today for CPE  Diagnoses and all orders for this visit:  Encounter for Annual Physical Exam with abnormal findings Due annually  Health Maintenance reviewed Healthy lifestyle reviewed and goals set  Declines all vaccines, mammogram, colon cancer screening, DEXA follow up despite discussion of risks. She is requesting minimal interventions. She is agreeable for low dose CT scan for lung cancer screening.   Atherosclerosis of abdominal aorta ([place]) - per CT 12/2016 Control blood pressure, cholesterol, glucose, increase exercise.  STOP SMOKING Refer back to cardiology for evaluation - EKG   CAD STOP SMOKING Continue medications, denies angina Follows with Cardiology, Sara Lamb. Hasn't seen for 18 months Control blood pressure, cholesterol, glucose, increase exercise.  Pitting edema of lower legs  Essential hypertension Monitor blood pressure at home; call if consistently over 130/80 Continue DASH diet.   Reminder to go to the ER if any CP, SOB, nausea, dizziness, severe HA, changes vision/speech, left arm numbness and tingling and jaw pain.  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 Schedule diabetes eye exam  - plan Foot exam completed -     Hemoglobin A1c -     Lipid panel  Liver cirrhosis secondary to NASH (HCC) Declines GI follow up at this time; working on weight loss, low processed carbohydrate diet for NASH management -     COMPLETE METABOLIC PANEL WITH GFR  Chronic obstructive pulmonary disease, unspecified COPD type ([place]) Denies sx; STOP smoking; denies sx; monitor   Hyperlipidemia associated with type 2 diabetes mellitus ([place]) Titrate as tolerated for LDL goal <70 Several agents with reported SE/intolerances, has not tried other statins - low dose  pravastatin discussed, she is receptive, sent in to try;  Discussed dietary and exercise modifications. -     Lipid panel  Osteoporosis, unspecified osteoporosis type, unspecified pathological fracture presence Due for DEXA scan -patient declines at this time  Discussed Calcium and Vit D supplementation Weight bearing exercise encouraged STOP SMOKING  Tobacco use disorder Discussed smoking cessation.  Reports she is smoking 2 packs a day.  Has been using gum to help.  Reports she has tried chantix, not ready to quit. Encouraged to taper down.  Will continue to assess readiness. -continue low dose annual CT screening - ordered today, last was 2021  Chronic hyponatremia Monitor;  -     COMPLETE METABOLIC PANEL WITH GFR  Anxiety Improved with Cymbalta; reviewed goal to avoid daily use of xanax, continue to taper, declines alternative med option for sleep today Close follow up 3 months Stress management techniques discussed, increase water, good sleep hygiene discussed, increase exercise, and increase veggies.    Vitamin D deficiency -     VITAMIN D 25 Hydroxy (Vit-D Deficiency, Fractures)  Poor compliance with medication Discussed this at length with patient.  Denies current barriers  Localized edema Begin Furosemide 40 mg 1 tab daily Wrap legs in Ace wraps firmly at beginning of each day and take off at bedtime Follow up with Sara. Ellyn Lamb office tomorrow Go to the ER if any chest pain, shortness of breath, nausea, dizziness, severe HA, changes vision/speech, legs start to weep   Medication Management - CBC. TSH. Magnesium   Over 40 minutes of exam, counseling, chart review and critical decision making was performed Future Appointments  Date Time Provider [place]  12/22/2022  4:00 PM Sara Rossetti, Sara [facility] None  07/06/2023  2:00 PM Sara Rossetti, Sara [facility] None     Subjective:  Sara Lamb is a 71 y.o. MWF female who presents for CPE. She  has Vitamin D deficiency; Anxiety; Essential hypertension; Hyperlipidemia associated with type 2 diabetes mellitus ([place]); Poor compliance with medication; Tobacco use disorder; COPD (chronic obstructive pulmonary disease) ([place]); Atherosclerosis of abdominal aorta ([place]) - per CT 12/2016; Osteoporosis; Liver cirrhosis secondary to NASH ([place]); Cognitive dysfunction; Coronary artery disease, non-occlusive: RCA 60%, FFR Negative.; Chronic hyponatremia; Type 2 diabetes mellitus with circulatory disorder ([place]); Chest pain with low risk for cardiac etiology; Overweight (BMI 25.0-29.9); CKD stage 2 due to type 2 diabetes mellitus ([place]); and Rash and other nonspecific skin eruption on their problem list.  She is married, 1 son. She declines most cancer screenings, would decline treatment. Also declines vaccines. Has hx of numerous intolerances with meds.   She is s/p fall in Jan 2021 and fracture to her hip, continues to use walker, 1 step at home, can manage with handrail, completed PT. Has handicap shower installed with rail. She is on gabapentin 1109m that is helping. Denies falls since.  [facility], also takes flexeril and voltaren.   She is on cymbalta for mood and chronic pain and has noted some improvement. Has weaned off Xanax She reports trazodone made her more irritated/kept her awake.   She has cirrhosis due to NASH, has seen GI Sara. AHavery Lamb EGD 01/2017, no varices. Denies alcohol intake.   Continues to smoke, reports has reduced from 2-2.5 to 1 ppd.. Last CT chest 07/23/2020 showed small nodules, aortic atherosclerosis, emphysematous changes. She does have dyspnea on exertion  BMI is Body mass index is 30.56 kg/m., she has been working on diet, keeping a close log, admits minimal exercise due to pain. She admits no water, drinks mostly coke zero with lots of ice.  Wt Readings from Last 3 Encounters:  07/02/22 146 lb 3.2 oz (66.3 kg)  06/30/22 144  lb (65.3 kg)  06/23/22 141 lb (64 kg)   Had cath 2019 showed Prox RCA to Mid RCA lesion is 60% stenosed. FFR negative- following with Sara. HEllyn Lamb Perfusion study 06/2020 was low risk.  She is having increased swelling of lower extremities bilaterally, fatigue and dyspnea on exertion. Has not been doing ACE wraps as prescribed  Her blood pressure has been controlled at home, today their BP is BP: (!) 170/70 . She is currently on Nadolol 20 mg QD, lisinopril 2.5 mg QD BP Readings from Last 3 Encounters:  07/02/22 (!) 170/70  06/30/22 (!) 181/91  06/23/22 (!) 150/78  She does not workout. She denies shortness of breath, dizziness.    She has aortic atherosclerosis per CT 12/2016 and several since.  She is not on cholesterol medication alleging several intolerances (most recently rosuvastatin (diarrhea) and denies myalgias. She has failed welchol and colespitaol in the Her cholesterol is not at goal. The cholesterol last visit was:   Lab Results  Component Value Date   CHOL 246 (H) 01/05/2022   HDL 61 01/05/2022   LDLCALC 149 (H) 01/05/2022   TRIG 217 (H) 01/05/2022   CHOLHDL 4.0 01/05/2022   She has had diabetes for 20 years. She has not been working on diet and exercise for diabetes, and denies hyperglycemia, hypoglycemia , nausea, paresthesia of the feet, polydipsia, polyuria, visual disturbances, vomiting and weight loss. She is prescribed  glimepiride 4 mg BID, lantus 23 units PM Reports fasting BS this AM was 103 Lab Results  Component Value Date   HGBA1C 7.0 (H) 01/05/2022   Last GFR: Lab Results  Component Value Date   GFRNONAA >60 06/09/2022   Patient is on Vitamin D supplement, taking 1000 units three days a week, GI upset with higher doses.   Lab Results  Component Value Date   VD25OH 7 (L) 03/10/2022       Medication Review: Current Outpatient Medications on File Prior to Visit  Medication Sig Dispense Refill   acetaminophen (TYLENOL) 500 MG tablet Take 500 mg by  mouth as directed. Take 2 tablets three times ad ay as needed     aspirin EC 81 MG tablet Take 81 mg by mouth daily.     divalproex (DEPAKOTE) 250 MG Sara tablet Take 1 tablet 3 x /day for Hereditary tremor or "Shaking Palsy" 270 tablet 1   DULoxetine (CYMBALTA) 20 MG capsule Take  1 capsule  Daily  for Chronic Pain 90 capsule 3   gabapentin (NEURONTIN) 100 MG capsule TAKE ONE CAPSULE BY MOUTH THREE TIMES DAILY 270 capsule 1   gabapentin (NEURONTIN) 800 MG tablet Take 1/2 to 1 tablet at Bedtime for Neuropathy & Sleep ( & continue 100 mg capsules 3 x /day ) 90 tablet 3   glimepiride (AMARYL) 4 MG tablet TAKE ONE TABLET BY MOUTH TWICE DAILY with a meal FOR DIABETES 180 tablet 0   HYDROcodone-acetaminophen (NORCO) 5-325 MG tablet Take 1 tablet by mouth every 4 (four) hours as needed. 10 tablet 0   hydrOXYzine (ATARAX) 25 MG tablet Take 1 tablet 3 x /day fr Nerves /Anxiety or 1 to 2 tablets at Bedtime as needed for Sleep 270 tablet 0   insulin glargine (LANTUS SOLOSTAR) 100 UNIT/ML Solostar Pen Inject 23 Units into the skin at bedtime. 15 mL 1   Insulin Pen Needle (CAREFINE PEN NEEDLES) 32G X 4 MM MISC Use 1x a day 100 each 3   ipratropium-albuterol (DUONEB) 0.5-2.5 (3) MG/3ML SOLN Take 3 mLs by nebulization every 4 (four) hours as needed. Max:6 doses per day 540 mL 3   lisinopril (ZESTRIL) 2.5 MG tablet TAKE ONE TABLET BY MOUTH NIGHTLY FOR BLOOD PRESSURE 180 tablet 0   nadolol (CORGARD) 20 MG tablet Take 1 tablet Daily for BP & Hereditary Familial Essential Tremor 90 tablet 3   ondansetron (ZOFRAN) 4 MG tablet Take 1 tablet (4 mg total) by mouth daily as needed for nausea or vomiting. 30 tablet 1   traZODone (DESYREL) 50 MG tablet Take 1/2 to 1 tablet at Bedtime if needed for Sleep 90 tablet 3   triamcinolone ointment (KENALOG) 0.5 % Apply 1 Application topically 2 (two) times daily. 15 g 1   No current facility-administered medications on file prior to visit.    Allergies  Allergen Reactions    Actos [Pioglitazone] Other (See Comments)    Patient stated that it elevated her BGL   Glyburide Other (See Comments)    Patient stated that it elevated her BGL   Metformin And Related     "metformin caused mini-strokes" and ELEVATED BS and also GLYBURIDE, ONGLYZA, ACTOS, INVOKANA caused elevated BS   Onglyza [Saxagliptin] Other (See Comments)    Patient stated that it elevated her BGL   Rosuvastatin Diarrhea   Insulins Rash   Janumet [Sitagliptin-Metformin Hcl] Rash and Other (See Comments)    Shakes/ rash   Penicillins Rash and Other (See Comments)  Current Problems (verified) Patient Active Problem List   Diagnosis Date Noted   Rash and other nonspecific skin eruption 02/12/2021   CKD stage 2 due to type 2 diabetes mellitus ([place]) 02/11/2021   Overweight (BMI 25.0-29.9) 10/11/2020   Chest pain with low risk for cardiac etiology 06/24/2020   Type 2 diabetes mellitus with circulatory disorder ([place]) 11/24/2018   Chronic hyponatremia 03/18/2018   Coronary artery disease, non-occlusive: RCA 60%, FFR Negative. 02/16/2018   Cognitive dysfunction 03/27/2017   Liver cirrhosis secondary to NASH ([place]) 11/23/2016   Osteoporosis 05/27/2016   Atherosclerosis of abdominal aorta ([place]) - per CT 12/2016 04/30/2016   COPD (chronic obstructive pulmonary disease) ([place]) 04/30/2015   Tobacco use disorder 04/08/2015   Poor compliance with medication 11/05/2014   Vitamin D deficiency    Anxiety    Essential hypertension    Hyperlipidemia associated with type 2 diabetes mellitus ([place])     Screening Tests Immunization History  Administered Date(s) Administered   Hep A / Hep B 01/07/2017, 02/08/2017, 07/09/2017   Td 06/26/2001   Tdap 04/30/2015   Preventative care: Last colonoscopy: 12/2016, polyps, Sara. Havery Lamb, 3 year follow up due , however she adamantly declines, understands risks of progresion to cancer EGD: 01/2017 gastritis, no varices  Last mammogram: 05/25/2016 OVERDUE - declines,  would not pursue treatment if positive  Last pap smear/pelvic exam: remote, declines  DEXA: 04/2016 - R fem T -3.2, declines follow up at this time   MRI brain 2016 CT chest 07/23/2020 - order follow up at [facility]  Prior vaccinations: DECLINES ALL VACCINES TD or Tdap: 2016   Names of Other Physician/Practitioners you currently use: 1. [facility] here for primary care 2. none, eye doctor, encouraged to see eye doctor - she states will call Sara. Gilford Rile in [place] for diabetes eye exam  3.  Dentures dentist  Patient Care Team: Unk Pinto, MD as PCP - General (Internal Medicine) Leonie Man, MD as PCP - Cardiology (Cardiology) Kinsinger, Arta Bruce, MD as Consulting Physician (General Surgery) Philemon Kingdom, MD as Consulting Physician (Internal Medicine) Armbruster, Carlota Raspberry, MD as Consulting Physician (Gastroenterology) [name], [facility] (Inactive) as Pharmacist (Pharmacist)  SURGICAL HISTORY She  has a past surgical history that includes Vaginal hysterectomy (age 42); Bilateral salpingoophorectomy (1997); Knee arthroscopy (Right, 11/2014); Cholecystectomy (N/A, 09/30/2016); LEFT HEART CATH AND CORONARY ANGIOGRAPHY (N/A, 04/12/2018); INTRAVASCULAR PRESSURE WIRE/FFR STUDY (N/A, 04/12/2018); transthoracic echocardiogram (02/2018); NM MYOVIEW LTD (07/03/2020); and ORIF hip fracture (Right, 2021). FAMILY HISTORY Her family history includes Breast cancer (age of onset: 37) in her sister; CAD in her maternal aunt, maternal uncle, and sister; CAD (age of onset: 65) in her mother; Cancer in her father; Coronary artery disease (age of onset: 25) in her brother; Diabetes in her brother, father, and mother; Heart attack in her maternal grandmother; Heart attack (age of onset: 44) in her sister; Hyperlipidemia in her brother, sister, and sister; Hypertension in her brother, father, and sister; Kidney Stones in her son; Stomach cancer in her mother; Stroke in  her mother.  SOCIAL HISTORY She  reports that she has been smoking cigarettes. She started smoking about 54 years ago. She has a 106.00 pack-year smoking history. She has never used smokeless tobacco. She reports that she does not drink alcohol and does not use drugs.   Review of Systems  Constitutional:  Positive for malaise/fatigue. Negative for weight loss.  HENT:  Negative for hearing loss and tinnitus.   Eyes:  Negative  for blurred vision and double vision.  Respiratory:  Positive for shortness of breath (on exertion) and wheezing (occasional from smoking). Negative for cough and sputum production.   Cardiovascular:  Positive for leg swelling. Negative for chest pain, palpitations, orthopnea, claudication and PND.  Gastrointestinal:  Negative for abdominal pain, blood in stool, constipation, diarrhea, heartburn, melena, nausea and vomiting.  Genitourinary: Negative.   Musculoskeletal:  Positive for joint pain (chronic hip s/p fracture). Negative for falls and myalgias.  Skin:  Negative for rash.  Neurological:  Negative for dizziness, tingling, sensory change, weakness and headaches.  Endo/Heme/Allergies:  Negative for polydipsia.  Psychiatric/Behavioral: Negative.  Negative for depression, memory loss, substance abuse and suicidal ideas. The patient is not nervous/anxious and does not have insomnia.   All other systems reviewed and are negative.    Objective:     Today's Vitals   07/02/22 1401  BP: (!) 170/70  Pulse: 65  Resp: 18  Temp: 97.9 F (36.6 C)  SpO2: 98%  Weight: 146 lb 3.2 oz (66.3 kg)  Height: 4' 10"  (1.473 m)   Body mass index is 30.56 kg/m.  General appearance: alert, no distress, WD/WN, female HEENT: normocephalic, sclerae anicteric, TMs pearly, nares patent, no discharge or erythema, pharynx normal Oral cavity: MMM, no lesions Neck: supple, no lymphadenopathy, no thyromegaly, no masses Heart: RRR, normal S1, S2, murmurs Lungs: CTA bilaterally, no  wheezes, rhonchi, or rales Abdomen: +bs, soft, obese abdomen, non tender, non distended, no palpable masses Musculoskeletal: no swelling, no obvious deformity, slow antalgic gait with walker.  Extremities: 2+ pitting edema bil lower legs,and lower arms/hands,red discoloration, dry flaky skin Pulses: 1+ lower extremities,  Neurological: alert, oriented x 3, CN2-12 intact, strength normal upper extremities/ right lower extremity and decreased in lower left extremity, sensation normal to monofilament in bil feet,  no cerebellar signs, gait slow antalgic with walker.  Psychiatric: normal affect, behavior normal, pleasant  Skin: Dry , flaky, redness of left knee has resolved Breasts: declines GU: defer, no concerns  EKG:  NSR, no ST changes AAA: Defer to cardiology  Sara Rossetti, Sara   07/02/2022

## 2022-07-03 ENCOUNTER — Encounter: Payer: Self-pay | Admitting: Nurse Practitioner

## 2022-07-03 ENCOUNTER — Ambulatory Visit: Payer: PPO | Attending: Nurse Practitioner | Admitting: Nurse Practitioner

## 2022-07-03 ENCOUNTER — Other Ambulatory Visit: Payer: Self-pay | Admitting: *Deleted

## 2022-07-03 ENCOUNTER — Other Ambulatory Visit: Payer: Self-pay | Admitting: Nurse Practitioner

## 2022-07-03 VITALS — BP 156/76 | HR 69 | Ht <= 58 in | Wt 145.0 lb

## 2022-07-03 DIAGNOSIS — R0600 Dyspnea, unspecified: Secondary | ICD-10-CM | POA: Diagnosis not present

## 2022-07-03 DIAGNOSIS — I5033 Acute on chronic diastolic (congestive) heart failure: Secondary | ICD-10-CM | POA: Diagnosis not present

## 2022-07-03 DIAGNOSIS — I10 Essential (primary) hypertension: Secondary | ICD-10-CM

## 2022-07-03 DIAGNOSIS — I2510 Atherosclerotic heart disease of native coronary artery without angina pectoris: Secondary | ICD-10-CM

## 2022-07-03 DIAGNOSIS — R609 Edema, unspecified: Secondary | ICD-10-CM

## 2022-07-03 DIAGNOSIS — D72820 Lymphocytosis (symptomatic): Secondary | ICD-10-CM

## 2022-07-03 DIAGNOSIS — I5031 Acute diastolic (congestive) heart failure: Secondary | ICD-10-CM

## 2022-07-03 MED ORDER — SPIRONOLACTONE 25 MG PO TABS: 25.0000 mg | ORAL_TABLET | Freq: Every day | ORAL | 11 refills | Status: DC

## 2022-07-03 MED ORDER — FUROSEMIDE 40 MG PO TABS: 40.0000 mg | ORAL_TABLET | Freq: Every day | ORAL | 11 refills | Status: DC

## 2022-07-03 NOTE — Patient Instructions (Signed)
Medication Instructions:   TAKE ENTERIC COATED Asprin one (1) tablet by mouth ( 81 mg) daily.   START Aldactone one (1) tablet by mouth ( 25 mg) daily.   TAKE Lasix one (1) tablet by mouth ( 40 mg) daily.   *If you need a refill on your cardiac medications before your next appointment, please call your pharmacy*   Lab Work:  Your physician recommends that you return for lab work on Wednesday, December 20 You can come in on the day of your appointment anytime between 7:30-4:30   If you have labs (blood work) drawn today and your tests are completely normal, you will receive your results only by: [place] (if you have MyChart) OR A paper copy in the mail If you have any lab test that is abnormal or we need to change your treatment, we will call you to review the results.   Testing/Procedures:  Your physician has requested that you have an echocardiogram. Echocardiography is a painless test that uses sound waves to create images of your heart. It provides your doctor with information about the size and shape of your heart and how well your heart's chambers and valves are working. This procedure takes approximately one hour. There are no restrictions for this procedure. Please do NOT wear cologne, perfume, aftershave, or lotions (deodorant is allowed). Please arrive 15 minutes prior to your appointment time.    Follow-Up: At [facility], you and your health needs are our priority.  As part of our continuing mission to provide you with exceptional heart care, we have created designated Provider Care Teams.  These Care Teams include your primary Cardiologist (physician) and Advanced Practice Providers (APPs -  Physician Assistants and Nurse Practitioners) who all work together to provide you with the care you need, when you need it.  We recommend signing up for the patient portal called "MyChart".  Sign up information is provided on this After Visit Summary.  MyChart is  used to connect with patients for Virtual Visits (Telemedicine).  Patients are able to view lab/test results, encounter notes, upcoming appointments, etc.  Non-urgent messages can be sent to your provider as well.   To learn more about what you can do with MyChart, go to [email or web address].    Your next appointment:   4 week(s)  The format for your next appointment:   In Person  Provider:   Diona Browner, NP        Other Instructions  DASH Eating Plan DASH stands for Dietary Approaches to Stop Hypertension. The DASH eating plan is a healthy eating plan that has been shown to: Reduce high blood pressure (hypertension). Reduce your risk for type 2 diabetes, heart disease, and stroke. Help with weight loss. What are tips for following this plan? Reading food labels Check food labels for the amount of salt (sodium) per serving. Choose foods with less than 5 percent of the Daily Value of sodium. Generally, foods with less than 300 milligrams (mg) of sodium per serving fit into this eating plan. To find whole grains, look for the word "whole" as the first word in the ingredient list. Shopping Buy products labeled as "low-sodium" or "no salt added." Buy fresh foods. Avoid canned foods and pre-made or frozen meals. Cooking Avoid adding salt when cooking. Use salt-free seasonings or herbs instead of table salt or sea salt. Check with your health care provider or pharmacist before using salt substitutes. Do not fry foods. Cook foods using healthy methods such as  baking, boiling, grilling, roasting, and broiling instead. Cook with heart-healthy oils, such as olive, canola, avocado, soybean, or sunflower oil. Meal planning  Eat a balanced diet that includes: 4 or more servings of fruits and 4 or more servings of vegetables each day. Try to fill one-half of your plate with fruits and vegetables. 6-8 servings of whole grains each day. Less than 6 oz (170 g) of lean meat, poultry, or fish each  day. A 3-oz (85-g) serving of meat is about the same size as a deck of cards. One egg equals 1 oz (28 g). 2-3 servings of low-fat dairy each day. One serving is 1 cup (237 mL). 1 serving of nuts, seeds, or beans 5 times each week. 2-3 servings of heart-healthy fats. Healthy fats called omega-3 fatty acids are found in foods such as walnuts, flaxseeds, fortified milks, and eggs. These fats are also found in cold-water fish, such as sardines, salmon, and mackerel. Limit how much you eat of: Canned or prepackaged foods. Food that is high in trans fat, such as some fried foods. Food that is high in saturated fat, such as fatty meat. Desserts and other sweets, sugary drinks, and other foods with added sugar. Full-fat dairy products. Do not salt foods before eating. Do not eat more than 4 egg yolks a week. Try to eat at least 2 vegetarian meals a week. Eat more home-cooked food and less restaurant, buffet, and fast food. Lifestyle When eating at a restaurant, ask that your food be prepared with less salt or no salt, if possible. If you drink alcohol: Limit how much you use to: 0-1 drink a day for women who are not pregnant. 0-2 drinks a day for men. Be aware of how much alcohol is in your drink. In the [place] one drink equals one 12 oz bottle of beer (355 mL), one 5 oz glass of wine (148 mL), or one 1 oz glass of hard liquor (44 mL). General information Avoid eating more than 2,300 mg of salt a day. If you have hypertension, you may need to reduce your sodium intake to 1,500 mg a day. Work with your health care provider to maintain a healthy body weight or to lose weight. Ask what an ideal weight is for you. Get at least 30 minutes of exercise that causes your heart to beat faster (aerobic exercise) most days of the week. Activities may include walking, swimming, or biking. Work with your health care provider or dietitian to adjust your eating plan to your individual calorie needs. What foods  should I eat? Fruits All fresh, dried, or frozen fruit. Canned fruit in natural juice (without added sugar). Vegetables Fresh or frozen vegetables (raw, steamed, roasted, or grilled). Low-sodium or reduced-sodium tomato and vegetable juice. Low-sodium or reduced-sodium tomato sauce and tomato paste. Low-sodium or reduced-sodium canned vegetables. Grains Whole-grain or whole-wheat bread. Whole-grain or whole-wheat pasta. Brown rice. [place]. Bulgur. Whole-grain and low-sodium cereals. Pita bread. Low-fat, low-sodium crackers. Whole-wheat flour tortillas. Meats and other proteins Skinless chicken or [place]. Ground chicken or [place]. Pork with fat trimmed off. Fish and seafood. Egg whites. Dried beans, peas, or lentils. Unsalted nuts, nut butters, and seeds. Unsalted canned beans. Lean cuts of beef with fat trimmed off. Low-sodium, lean precooked or cured meat, such as sausages or meat loaves. Dairy Low-fat (1%) or fat-free (skim) milk. Reduced-fat, low-fat, or fat-free cheeses. Nonfat, low-sodium ricotta or cottage cheese. Low-fat or nonfat yogurt. Low-fat, low-sodium cheese. Fats and oils Soft margarine without trans fats.  Vegetable oil. Reduced-fat, low-fat, or light mayonnaise and salad dressings (reduced-sodium). Canola, safflower, olive, avocado, soybean, and sunflower oils. Avocado. Seasonings and condiments Herbs. Spices. Seasoning mixes without salt. Other foods Unsalted popcorn and pretzels. Fat-free sweets. The items listed above may not be a complete list of foods and beverages you can eat. Contact a dietitian for more information. What foods should I avoid? Fruits Canned fruit in a light or heavy syrup. Fried fruit. Fruit in cream or butter sauce. Vegetables Creamed or fried vegetables. Vegetables in a cheese sauce. Regular canned vegetables (not low-sodium or reduced-sodium). Regular canned tomato sauce and paste (not low-sodium or reduced-sodium). Regular tomato and  vegetable juice (not low-sodium or reduced-sodium). Angie Fava. Olives. Grains Baked goods made with fat, such as croissants, muffins, or some breads. Dry pasta or rice meal packs. Meats and other proteins Fatty cuts of meat. Ribs. Fried meat. Berniece Salines. Bologna, salami, and other precooked or cured meats, such as sausages or meat loaves. Fat from the back of a pig (fatback). Bratwurst. Salted nuts and seeds. Canned beans with added salt. Canned or smoked fish. Whole eggs or egg yolks. Chicken or [place] with skin. Dairy Whole or 2% milk, cream, and half-and-half. Whole or full-fat cream cheese. Whole-fat or sweetened yogurt. Full-fat cheese. Nondairy creamers. Whipped toppings. Processed cheese and cheese spreads. Fats and oils Butter. Stick margarine. Lard. Shortening. Ghee. Bacon fat. Tropical oils, such as coconut, palm kernel, or palm oil. Seasonings and condiments Onion salt, garlic salt, seasoned salt, table salt, and sea salt. Worcestershire sauce. Tartar sauce. Barbecue sauce. Teriyaki sauce. Soy sauce, including reduced-sodium. Steak sauce. Canned and packaged gravies. Fish sauce. Oyster sauce. Cocktail sauce. Store-bought horseradish. Ketchup. Mustard. Meat flavorings and tenderizers. Bouillon cubes. Hot sauces. Pre-made or packaged marinades. Pre-made or packaged taco seasonings. Relishes. Regular salad dressings. Other foods Salted popcorn and pretzels. The items listed above may not be a complete list of foods and beverages you should avoid. Contact a dietitian for more information. Where to find more information National Heart, Lung, and Blood Institute: https://wilson-eaton.com/ American Heart Association: www.heart.org Academy of Nutrition and Dietetics: www.eatright.[place]: www.kidney.org Summary The DASH eating plan is a healthy eating plan that has been shown to reduce high blood pressure (hypertension). It may also reduce your risk for type 2 diabetes, heart disease,  and stroke. When on the DASH eating plan, aim to eat more fresh fruits and vegetables, whole grains, lean proteins, low-fat dairy, and heart-healthy fats. With the DASH eating plan, you should limit salt (sodium) intake to 2,300 mg a day. If you have hypertension, you may need to reduce your sodium intake to 1,500 mg a day. Work with your health care provider or dietitian to adjust your eating plan to your individual calorie needs. This information is not intended to replace advice given to you by your health care provider. Make sure you discuss any questions you have with your health care provider. Document Revised: 06/16/2019 Document Reviewed: 06/16/2019 Elsevier Patient Education  [place].   For your  leg edema you  should do  the following 1. Leg elevation - I recommend the Lounge Dr. Leg rest.  See below for details  2. Salt restriction  -  Use potassium chloride instead of regular salt as a salt substitute. 3. Walk regularly 4. Compression hose - [employer] store  5. Weight loss    Available on [email or web address] Or  Go to [email or web address]    Heart Failure Education: Weigh yourself EVERY morning after  you go to the bathroom but before you eat or drink anything. Write this number down in a weight log/diary. If you gain 3 pounds overnight or 5 pounds in a week, call the office. Take your medicines as prescribed. If you have concerns about your medications, please call us before you stop taking them.  Eat low salt foods--Limit salt (sodium) to 2000 mg per day. This will help prevent your body from holding onto fluid. Read food labels as many processed foods have a lot of sodium, especially canned goods and prepackaged meats. If you would like some assistance choosing low sodium foods, we would be happy to set you up with a nutritionist. Stay as active as you can everyday. Staying active will give you more energy and make your muscles stronger. Start with 5 minutes at a time and  work your way up to 30 minutes a day. Break up your activities--do some in the morning and some in the afternoon. Start with 3 days per week and work your way up to 5 days as you can.  If you have chest pain, feel short of breath, dizzy, or lightheaded, STOP. If you don't feel better after a short rest, call 911. If you do feel better, call the office to let us know you have symptoms with exercise. Limit all fluids for the day to less than 2 liters. Fluid includes all drinks, coffee, juice, ice chips, soup, jello, and all other liquids.   Important Information About Sugar

## 2022-07-03 NOTE — Progress Notes (Signed)
Cardiology Office Note:    Date:  07/03/2022   ID:  Sara Lamb, DOB 04/29/1951, MRN [id number]  PCP:  Unk Pinto, MD   [facility] Providers Cardiologist:  Glenetta Hew, MD     Referring MD: Unk Pinto, MD   Chief Complaint:   History of Present Illness:    Sara Lamb is a pleasant 71 y.o. female with a hx of moderate nonobstructive CAD, HTN, type 2 diabetes, HLD, former tobacco abuse  Referred to cardiology by PCP for evaluation of chest pain.  She describes sharp central left-sided chest pain over the previous few weeks associated with dyspnea.  Cardiac cath 03/2018 revealed proximal RCA to mid RCA lesion 60% stenosed, negative FFR.  LV 65% by visual estimate, normal LVEDP. Medical management, start asa 81 mg daily.   Last cardiology clinic visit was 09/03/2020 with Dr. Ellyn Hack at which time blood pressures were labile.  She reported no further episodes of chest pain.  Walking with a walker due to limitation from hips and knees.  She was advised to stop Imdur after completing her current prescription.  Permission to hold lisinopril if BP remains low.  Advised to return in 12 months for follow-up.  Today, she is here with her husband for evaluation of edema. Recently treated for cellulitis of left knee. Has been immobile for > 1 month, mostly resting in the bed. Feels that she is swollen all over and has gained weight. Is not very mobile, has some dyspnea on exertion. No orthopnea, PND. Has always slept on 2 pillows. Symptoms started 10/31. Has had multiple ED and provider office visits. Saw PCP 07/02/22 and advised to start Lasix 40 mg - 1/2 to 1 tablet daily as needed. She denies chest pain, shortness of breath, lower extremity edema, fatigue, palpitations, melena, hematuria, hemoptysis, diaphoresis, weakness, presyncope, syncope, orthopnea, and PND.   Past Medical History:  Diagnosis Date   Anxiety    Anxiety disorder    Cirrhosis ([place])    COPD (chronic  obstructive pulmonary disease) ([place])    followed by pcp--  last exacerbation 11/ 2017   Coronary artery disease, non-occlusive 03/2018   Cardiac Cath: Moderate (60%) proximal-mid RCA (FFR 0.89 => NEGATIVE / not physiologically significant).  Normal EF and EDP.   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--  "gets real sharp stabbing pain over eye and speech screws up"  pt states takes excederin migraine and lies down (pt stated was told if she "if come to ER again they would send her to psych ward")   Hyperlipidemia    Hypertension    OA (osteoarthritis)    back, hips, knees, feet   Osteoporosis    Polyp of colon, hyperplastic    Pulmonary nodules    per CT chest 08-28-2016  -- bilateral upper lobe nodules   S/P right hip fracture 09/06/2019   Stroke ([place])    Tubular adenoma of colon    Type 2 diabetes mellitus ([place])    followed by pcp--  last A1c 9.1 on 08-01-2016   UTI (urinary tract infection)    Wears dentures     Past Surgical History:  Procedure Laterality Date   BILATERAL SALPINGOOPHORECTOMY  1997   via Laparoscopy   CHOLECYSTECTOMY N/A 09/30/2016   Procedure: LAPAROSCOPIC CHOLECYSTECTOMY;  Surgeon: Mickeal Skinner, MD;  Location: [facility];  Service: General;  Laterality: N/A;   INTRAVASCULAR PRESSURE WIRE/FFR STUDY N/A 04/12/2018   Procedure: INTRAVASCULAR PRESSURE WIRE/FFR STUDY;  Surgeon: Leonie Man, MD;  Location: [place] [facility];  Service: Cardiovascular;  Laterality: N/A;   KNEE ARTHROSCOPY Right 11/2014   LEFT HEART CATH AND CORONARY ANGIOGRAPHY N/A 04/12/2018   Procedure: LEFT HEART CATH AND CORONARY ANGIOGRAPHY;  Surgeon: Leonie Man, MD;  Location: [place];  Service: Cardiovascular;  Laterality: N/A;   NM MYOVIEW LTD  07/03/2020    EF>65%.  No EKG changes.  LOW RISK.  No ischemia or infarction.   ORIF HIP FRACTURE Right 2021   [facility], Dr. Ian Malkin   TRANSTHORACIC ECHOCARDIOGRAM  02/2018    Normal LV function.  EF 60 to 65%.  No RWMMA.  GRII DD.  Moderate LA dilation.   VAGINAL HYSTERECTOMY  age 62    Current Medications: Current Meds  Medication Sig   acetaminophen (TYLENOL) 500 MG tablet Take 500 mg by mouth as directed. Take 2 tablets three times ad ay as needed   aspirin EC 81 MG tablet Take 81 mg by mouth daily.   divalproex (DEPAKOTE) 250 MG DR tablet Take 1 tablet 3 x /day for Hereditary tremor or "Shaking Palsy"   DULoxetine (CYMBALTA) 20 MG capsule Take  1 capsule  Daily  for Chronic Pain   gabapentin (NEURONTIN) 100 MG capsule TAKE ONE CAPSULE BY MOUTH THREE TIMES DAILY   gabapentin (NEURONTIN) 800 MG tablet Take 1/2 to 1 tablet at Bedtime for Neuropathy & Sleep ( & continue 100 mg capsules 3 x /day )   glimepiride (AMARYL) 4 MG tablet TAKE ONE TABLET BY MOUTH TWICE DAILY with a meal FOR DIABETES   HYDROcodone-acetaminophen (NORCO) 5-325 MG tablet Take 1 tablet by mouth every 4 (four) hours as needed.   hydrOXYzine (ATARAX) 25 MG tablet Take 1 tablet 3 x /day fr Nerves /Anxiety or 1 to 2 tablets at Bedtime as needed for Sleep   insulin glargine (LANTUS SOLOSTAR) 100 UNIT/ML Solostar Pen Inject 23 Units into the skin at bedtime.   Insulin Pen Needle (CAREFINE PEN NEEDLES) 32G X 4 MM MISC Use 1x a day   ipratropium-albuterol (DUONEB) 0.5-2.5 (3) MG/3ML SOLN Take 3 mLs by nebulization every 4 (four) hours as needed. Max:6 doses per day   lisinopril (ZESTRIL) 2.5 MG tablet TAKE ONE TABLET BY MOUTH NIGHTLY FOR BLOOD PRESSURE   nadolol (CORGARD) 20 MG tablet Take 1 tablet Daily for BP & Hereditary Familial Essential Tremor   ondansetron (ZOFRAN) 4 MG tablet Take 1 tablet (4 mg total) by mouth daily as needed for nausea or vomiting.   spironolactone (ALDACTONE) 25 MG tablet Take 1 tablet (25 mg total) by mouth daily.   traZODone (DESYREL) 50 MG tablet Take 1/2 to 1 tablet at Bedtime if needed for Sleep    triamcinolone ointment (KENALOG) 0.5 % Apply 1 Application topically 2 (two) times daily.   [DISCONTINUED] furosemide (LASIX) 40 MG tablet Take 1/2 - 1 tab daily for swelling in legs     Allergies:   Actos [pioglitazone], Glyburide, Metformin and related, Onglyza [saxagliptin], Rosuvastatin, Insulins, Janumet [sitagliptin-metformin hcl], and Penicillins   Social History   Socioeconomic History   Marital status: Married    Spouse name: Not on file   Number of children: Not on file   Years of education: Not on file   Highest education level: Not on file  Occupational History   Not  on file  Tobacco Use   Smoking status: Every Day    Packs/day: 2.00    Years: 53.00    Total pack years: 106.00    Types: Cigarettes    Start date: 1969   Smokeless tobacco: Never   Tobacco comments:    Has smoked up to 2.5 packs/day, currently 2  Vaping Use   Vaping Use: Never used  Substance and Sexual Activity   Alcohol use: No    Alcohol/week: 0.0 standard drinks of alcohol   Drug use: No   Sexual activity: Not on file  Other Topics Concern   Not on file  Social History Narrative   Not on file   Social Determinants of Health   Financial Resource Strain: Not on file  Food Insecurity: Not on file  Transportation Needs: No Transportation Needs (05/13/2021)   PRAPARE - Hydrologist (Medical): No    Lack of Transportation (Non-Medical): No  Physical Activity: Inactive (10/28/2021)   Exercise Vital Sign    Days of Exercise per Week: 0 days    Minutes of Exercise per Session: 0 min  Stress: Not on file  Social Connections: Not on file     Family History: The patient's family history includes Breast cancer (age of onset: 44) in her sister; CAD in her maternal aunt, maternal uncle, and sister; CAD (age of onset: 63) in her mother; Cancer in her father; Coronary artery disease (age of onset: 76) in her brother; Diabetes in her brother, father, and mother; Heart attack  in her maternal grandmother; Heart attack (age of onset: 15) in her sister; Hyperlipidemia in her brother, sister, and sister; Hypertension in her brother, father, and sister; Kidney Stones in her son; Stomach cancer in her mother; Stroke in her mother. There is no history of Colon cancer, Esophageal cancer, or Rectal cancer.  ROS:   Please see the history of present illness.    + bilateral LE edema + weight gain All other systems reviewed and are negative.  Labs/Other Studies Reviewed:    The following studies were reviewed today:  LHC 04/12/18 Prox RCA to Mid RCA lesion is 60% stenosed. FFR negative (0 29). The left ventricular systolic function is normal. The left ventricular ejection fraction is greater than 65% by visual estimate. LV end diastolic pressure is normal. There is no mitral valve regurgitation with notable mitral annular calcification   Moderate single-vessel disease with FFR negative lesion proximal to mid RCA.   Plan: Discharge home after bedrest and TR band removal Will likely titrate medications -continue Imdur. Aggressive risk factor modification Smoking cessation counseling   Recommend Aspirin 54m daily for moderate CAD  Recent Labs: 07/02/2022: ALT 28; BUN 17; Creat 0.73; Hemoglobin 14.1; Magnesium 1.6; Platelets 324; Potassium 4.6; Sodium 136; TSH 1.27  Recent Lipid Panel    Component Value Date/Time   CHOL 199 07/02/2022 1454   TRIG 161 (H) 07/02/2022 1454   HDL 58 07/02/2022 1454   CHOLHDL 3.4 07/02/2022 1454   VLDL 39 (H) 11/23/2016 1300   LDLCALC 113 (H) 07/02/2022 1454     Risk Assessment/Calculations:      Physical Exam:    VS:  BP (!) 156/76   Pulse 69   Ht 4' 10"  (1.473 m)   Wt 145 lb (65.8 kg)   SpO2 99%   BMI 30.31 kg/m     Wt Readings from Last 3 Encounters:  07/03/22 145 lb (65.8 kg)  07/02/22 146 lb 3.2 oz (  66.3 kg)  06/30/22 144 lb (65.3 kg)     GEN:  Well nourished, well developed in no acute distress HEENT:  Normal NECK: No JVD; No carotid bruits CARDIAC: RRR, no murmurs, rubs, gallops RESPIRATORY:  Clear to auscultation without rales, wheezing or rhonchi  ABDOMEN: Soft, non-tender, non-distended MUSCULOSKELETAL:  Significant non-pitting bilateral LE edema with erythema and scaling of skin.  2+ pedal pulses, equal bilaterally SKIN: Warm and dry NEUROLOGIC:  Alert and oriented x 3 PSYCHIATRIC:  Normal affect   EKG:  EKG is not ordered today.  Reviewed EKG from PCP on 07/02/22 which revealed NSR at 64 bpm, no ST abnormality.  Diagnoses:    1. Essential hypertension   2. Dyspnea, unspecified type   3. Coronary artery disease, non-occlusive   4. Acute on chronic diastolic (congestive) heart failure ([place])   5. Edema, unspecified type    Assessment and Plan:     Acute on chronic HFpEF/DOE/Edema: Grade 2 diastolic dysfunction, normal LVEF on echo 02/2018.  Significant bilateral LE edema, weight gain, DOE x 6 weeks. She denies orthopnea, PND. Scr and electrolytes stable on lab work 12/7. Will increase furosemide to 40 mg daily.  Will add spironolactone 25 mg daily. Continue lisinopril 2.5 mg daily and get repeat bmet in 2 weeks. Will get echocardiogram for evaluation of heart and valve function. Encouraged leg elevation and compression, sodium restriction < 2000 mg daily, and continue to monitor weight. Limit fluids to 2 L daily. Would favor up-titration of ACEi or change to ARNI at next office visit if indicated and if kidney function is stable.   Hypertension: BP is elevated today. Has not been monitoring on a consistent basis but has had multiple medical visits recently and BP has been consistently elevated. Adding spironolactone 25 mg daily. Will have her take Lasix 40 mg daily and continue lisinopril 2.5 mg daily.   CAD without angina: Moderate single vessel nonobstructive CAD on cath 2019.  She is not having chest pain.  Symptoms felt to correlate with HFpEF as noted above.  Will get echo for  evaluation of wall motion abnormality. Asks about taking Pepcid with aspirin. Encouraged her to switch to Outlook.     Disposition: 3-4 weeks with Dr. Ellyn Hack or APP  Medication Adjustments/Labs and Tests Ordered: Current medicines are reviewed at length with the patient today.  Concerns regarding medicines are outlined above.  Orders Placed This Encounter  Procedures   Basic Metabolic Panel (BMET)   ECHOCARDIOGRAM COMPLETE   Meds ordered this encounter  Medications   furosemide (LASIX) 40 MG tablet    Sig: Take 1 tablet (40 mg total) by mouth daily.    Dispense:  30 tablet    Refill:  11   spironolactone (ALDACTONE) 25 MG tablet    Sig: Take 1 tablet (25 mg total) by mouth daily.    Dispense:  30 tablet    Refill:  11    There are no Patient Instructions on file for this visit.   Signed, Emmaline Life, NP  07/03/2022 3:14 PM    [place]

## 2022-07-04 LAB — HEMOGLOBIN A1C
Hgb A1c MFr Bld: 7.8 % of total Hgb — ABNORMAL HIGH (ref <5.7)
Mean Plasma Glucose: 177 mg/dL
eAG (mmol/L): 9.8 mmol/L

## 2022-07-04 LAB — URINALYSIS W MICROSCOPIC + REFLEX CULTURE
Bilirubin Urine: NEGATIVE
Ketones, ur: NEGATIVE
Nitrites, Initial: NEGATIVE
Specific Gravity, Urine: 1.013 (ref 1.001–1.035)
Squamous Epithelial / HPF: NONE SEEN /HPF (ref <=5)
pH: 6.5 (ref 5.0–8.0)

## 2022-07-04 LAB — COMPLETE METABOLIC PANEL WITH GFR
AG Ratio: 0.8 (calc) — ABNORMAL LOW (ref 1.0–2.5)
ALT: 28 U/L (ref 6–29)
AST: 35 U/L (ref 10–35)
Albumin: 2.2 g/dL — ABNORMAL LOW (ref 3.6–5.1)
Alkaline phosphatase (APISO): 129 U/L (ref 37–153)
BUN: 17 mg/dL (ref 7–25)
CO2: 35 mmol/L — ABNORMAL HIGH (ref 20–32)
Calcium: 8.4 mg/dL — ABNORMAL LOW (ref 8.6–10.4)
Chloride: 96 mmol/L — ABNORMAL LOW (ref 98–110)
Creat: 0.73 mg/dL (ref 0.60–1.00)
Globulin: 2.8 g/dL (calc) (ref 1.9–3.7)
Glucose, Bld: 56 mg/dL — ABNORMAL LOW (ref 65–99)
Potassium: 4.6 mmol/L (ref 3.5–5.3)
Sodium: 136 mmol/L (ref 135–146)
Total Bilirubin: 0.3 mg/dL (ref 0.2–1.2)
Total Protein: 5 g/dL — ABNORMAL LOW (ref 6.1–8.1)
eGFR: 88 mL/min/{1.73_m2} (ref >=60)

## 2022-07-04 LAB — CBC WITH DIFFERENTIAL/PLATELET
Absolute Monocytes: 704 cells/uL (ref 200–950)
Basophils Absolute: 115 cells/uL (ref 0–200)
Basophils Relative: 0.9 %
Eosinophils Absolute: 205 cells/uL (ref 15–500)
Eosinophils Relative: 1.6 %
HCT: 43.4 % (ref 35.0–45.0)
Hemoglobin: 14.1 g/dL (ref 11.7–15.5)
Lymphs Abs: 2611 cells/uL (ref 850–3900)
MCH: 29.1 pg (ref 27.0–33.0)
MCHC: 32.5 g/dL (ref 32.0–36.0)
MCV: 89.5 fL (ref 80.0–100.0)
MPV: 9.4 fL (ref 7.5–12.5)
Monocytes Relative: 5.5 %
Neutro Abs: 9165 cells/uL — ABNORMAL HIGH (ref 1500–7800)
Neutrophils Relative %: 71.6 %
Platelets: 324 10*3/uL (ref 140–400)
RBC: 4.85 10*6/uL (ref 3.80–5.10)
RDW: 11.7 % (ref 11.0–15.0)
Total Lymphocyte: 20.4 %
WBC: 12.8 10*3/uL — ABNORMAL HIGH (ref 3.8–10.8)

## 2022-07-04 LAB — URINE CULTURE
MICRO NUMBER:: 14286999
Result:: NO GROWTH
SPECIMEN QUALITY:: ADEQUATE

## 2022-07-04 LAB — TSH: TSH: 1.27 mIU/L (ref 0.40–4.50)

## 2022-07-04 LAB — MAGNESIUM: Magnesium: 1.6 mg/dL (ref 1.5–2.5)

## 2022-07-04 LAB — MICROALBUMIN / CREATININE URINE RATIO
Creatinine, Urine: 38 mg/dL (ref 20–275)
Microalb Creat Ratio: 5176 mcg/mg creat — ABNORMAL HIGH (ref <30)
Microalb, Ur: 196.7 mg/dL

## 2022-07-04 LAB — LIPID PANEL
Cholesterol: 199 mg/dL (ref <200)
HDL: 58 mg/dL (ref >=50)
LDL Cholesterol (Calc): 113 mg/dL (calc) — ABNORMAL HIGH
Non-HDL Cholesterol (Calc): 141 mg/dL (calc) — ABNORMAL HIGH (ref <130)
Total CHOL/HDL Ratio: 3.4 (calc) (ref <5.0)
Triglycerides: 161 mg/dL — ABNORMAL HIGH (ref <150)

## 2022-07-04 LAB — CULTURE INDICATED

## 2022-07-04 LAB — VITAMIN D 25 HYDROXY (VIT D DEFICIENCY, FRACTURES): Vit D, 25-Hydroxy: 7 ng/mL — ABNORMAL LOW (ref 30–100)

## 2022-07-15 ENCOUNTER — Ambulatory Visit (HOSPITAL_COMMUNITY): Payer: PPO | Attending: Nurse Practitioner

## 2022-07-15 ENCOUNTER — Ambulatory Visit: Payer: PPO

## 2022-07-15 DIAGNOSIS — R0600 Dyspnea, unspecified: Secondary | ICD-10-CM

## 2022-07-15 DIAGNOSIS — R609 Edema, unspecified: Secondary | ICD-10-CM | POA: Diagnosis not present

## 2022-07-15 DIAGNOSIS — I2510 Atherosclerotic heart disease of native coronary artery without angina pectoris: Secondary | ICD-10-CM | POA: Diagnosis not present

## 2022-07-15 DIAGNOSIS — I10 Essential (primary) hypertension: Secondary | ICD-10-CM

## 2022-07-15 DIAGNOSIS — I5033 Acute on chronic diastolic (congestive) heart failure: Secondary | ICD-10-CM

## 2022-07-15 LAB — ECHOCARDIOGRAM COMPLETE
Area-P 1/2: 3.86 cm2
MV M vel: 4.9 m/s
MV Peak grad: 96 mmHg
MV VTI: 1.39 cm2
Radius: 0.7 cm
S' Lateral: 2.2 cm

## 2022-07-16 LAB — BASIC METABOLIC PANEL
BUN/Creatinine Ratio: 19 (ref 12–28)
BUN: 16 mg/dL (ref 8–27)
CO2: 30 mmol/L — ABNORMAL HIGH (ref 20–29)
Calcium: 8.9 mg/dL (ref 8.7–10.3)
Chloride: 94 mmol/L — ABNORMAL LOW (ref 96–106)
Creatinine, Ser: 0.84 mg/dL (ref 0.57–1.00)
Glucose: 186 mg/dL — ABNORMAL HIGH (ref 70–99)
Potassium: 4.4 mmol/L (ref 3.5–5.2)
Sodium: 134 mmol/L (ref 134–144)
eGFR: 74 mL/min/{1.73_m2} (ref >59)

## 2022-07-22 ENCOUNTER — Ambulatory Visit (INDEPENDENT_AMBULATORY_CARE_PROVIDER_SITE_OTHER): Payer: PPO

## 2022-07-22 DIAGNOSIS — D72820 Lymphocytosis (symptomatic): Secondary | ICD-10-CM | POA: Diagnosis not present

## 2022-07-23 LAB — BASIC METABOLIC PANEL WITH GFR
BUN: 15 mg/dL (ref 7–25)
CO2: 33 mmol/L — ABNORMAL HIGH (ref 20–32)
Calcium: 8.7 mg/dL (ref 8.6–10.4)
Chloride: 94 mmol/L — ABNORMAL LOW (ref 98–110)
Creat: 0.7 mg/dL (ref 0.60–1.00)
Glucose, Bld: 97 mg/dL (ref 65–99)
Potassium: 4.3 mmol/L (ref 3.5–5.3)
Sodium: 134 mmol/L — ABNORMAL LOW (ref 135–146)
eGFR: 92 mL/min/{1.73_m2} (ref >=60)

## 2022-07-23 LAB — CBC WITH DIFFERENTIAL/PLATELET
Absolute Monocytes: 668 cells/uL (ref 200–950)
Basophils Absolute: 107 cells/uL (ref 0–200)
Basophils Relative: 1.2 %
Eosinophils Absolute: 481 cells/uL (ref 15–500)
Eosinophils Relative: 5.4 %
HCT: 40.1 % (ref 35.0–45.0)
Hemoglobin: 13.5 g/dL (ref 11.7–15.5)
Lymphs Abs: 1673 cells/uL (ref 850–3900)
MCH: 29.7 pg (ref 27.0–33.0)
MCHC: 33.7 g/dL (ref 32.0–36.0)
MCV: 88.3 fL (ref 80.0–100.0)
MPV: 9.9 fL (ref 7.5–12.5)
Monocytes Relative: 7.5 %
Neutro Abs: 5972 cells/uL (ref 1500–7800)
Neutrophils Relative %: 67.1 %
Platelets: 290 10*3/uL (ref 140–400)
RBC: 4.54 10*6/uL (ref 3.80–5.10)
RDW: 12.6 % (ref 11.0–15.0)
Total Lymphocyte: 18.8 %
WBC: 8.9 10*3/uL (ref 3.8–10.8)

## 2022-07-23 NOTE — Progress Notes (Signed)
Patient is aware of lab results. -e welch

## 2022-07-28 ENCOUNTER — Ambulatory Visit: Payer: PPO | Admitting: Nurse Practitioner

## 2022-07-28 NOTE — Progress Notes (Deleted)
Office Visit    Patient Name: Sara Lamb Date of Encounter: 07/28/2022  Primary Care Provider:  Unk Pinto, MD Primary Cardiologist:  Glenetta Hew, MD  Chief Complaint    72 year old female with a history of moderate nonobstructive CAD, chronic diastolic heart failure, mitral valve regurgitation, hypertension, hyperlipidemia, type 2 diabetes, and former tobacco use who presents for follow-up related to heart failure.  Past Medical History    Past Medical History:  Diagnosis Date   Anxiety    Anxiety disorder    Cirrhosis ([place])    COPD (chronic obstructive pulmonary disease) ([place])    followed by pcp--  last exacerbation 11/ 2017   Coronary artery disease, non-occlusive 03/2018   Cardiac Cath: Moderate (60%) proximal-mid RCA (FFR 0.89 => NEGATIVE / not physiologically significant).  Normal EF and EDP.   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--  "gets real sharp stabbing pain over eye and speech screws up"  pt states takes excederin migraine and lies down (pt stated was told if she "if come to ER again they would send her to psych ward")   Hyperlipidemia    Hypertension    OA (osteoarthritis)    back, hips, knees, feet   Osteoporosis    Polyp of colon, hyperplastic    Pulmonary nodules    per CT chest 08-28-2016  -- bilateral upper lobe nodules   S/P right hip fracture 09/06/2019   Stroke ([place])    Tubular adenoma of colon    Type 2 diabetes mellitus ([place])    followed by pcp--  last A1c 9.1 on 08-01-2016   UTI (urinary tract infection)    Wears dentures    Past Surgical History:  Procedure Laterality Date   BILATERAL SALPINGOOPHORECTOMY  1997   via Laparoscopy   CHOLECYSTECTOMY N/A 09/30/2016   Procedure: LAPAROSCOPIC CHOLECYSTECTOMY;  Surgeon: Mickeal Skinner, MD;  Location: [facility];  Service: General;  Laterality: N/A;   INTRAVASCULAR PRESSURE WIRE/FFR  STUDY N/A 04/12/2018   Procedure: INTRAVASCULAR PRESSURE WIRE/FFR STUDY;  Surgeon: Leonie Man, MD;  Location: [facility];  Service: Cardiovascular;  Laterality: N/A;   KNEE ARTHROSCOPY Right 11/2014   LEFT HEART CATH AND CORONARY ANGIOGRAPHY N/A 04/12/2018   Procedure: LEFT HEART CATH AND CORONARY ANGIOGRAPHY;  Surgeon: Leonie Man, MD;  Location: [facility];  Service: Cardiovascular;  Laterality: N/A;   NM MYOVIEW LTD  07/03/2020    EF>65%.  No EKG changes.  LOW RISK.  No ischemia or infarction.   ORIF HIP FRACTURE Right 2021   [facility], Dr. Ian Malkin   TRANSTHORACIC ECHOCARDIOGRAM  02/2018    Normal LV function.  EF 60 to 65%.  No RWMMA.  GRII DD.  Moderate LA dilation.   VAGINAL HYSTERECTOMY  age 68    Allergies  Allergies  Allergen Reactions   Actos [Pioglitazone] Other (See Comments)    Patient stated that it elevated her BGL   Glyburide Other (See Comments)    Patient stated that it elevated her BGL   Metformin And Related     "metformin caused mini-strokes" and ELEVATED BS and also GLYBURIDE, ONGLYZA, ACTOS, INVOKANA caused elevated BS   Onglyza [Saxagliptin] Other (See Comments)    Patient stated that it elevated her BGL   Rosuvastatin Diarrhea   Insulins Rash   Janumet [  Sitagliptin-Metformin Hcl] Rash and Other (See Comments)    Shakes/ rash   Penicillins Rash and Other (See Comments)    History of Present Illness    72 year old female with the above past medical history including moderate nonobstructive CAD, chronic diastolic heart failure, mitral valve regurgitation, hypertension, hyperlipidemia, type 2 diabetes, and former tobacco use.  She was initially referred to cardiology by her PCP for the evaluation of chest pain.  Cardiac catheterization 03/2018 revealed proximal RCA to mid RCA lesion with 60% stenosis, negative FFR, LV EF 65% by visual estimate, normal LVEDP.  Medical management was advised.  He was last seen in the office on  07/03/2022 and noted a 1 to 73-monthhistory of lower extremity edema, weight gain.  She was started on furosemide and spironolactone.  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  She presents today for follow-up.  Since her last visit  Chronic diastolic heart failure: Nonobstructive CAD: Mitral valve regurgitation:  Hypertension: Hyperlipidemia: Type 2 diabetes: Disposition:  Home Medications    Current Outpatient Medications  Medication Sig Dispense Refill   acetaminophen (TYLENOL) 500 MG tablet Take 500 mg by mouth as directed. Take 2 tablets three times ad ay as needed     aspirin EC 81 MG tablet Take 81 mg by mouth daily.     divalproex (DEPAKOTE) 250 MG DR tablet Take 1 tablet 3 x /day for Hereditary tremor or "Shaking Palsy" 270 tablet 1   DULoxetine (CYMBALTA) 20 MG capsule Take  1 capsule  Daily  for Chronic Pain 90 capsule 3   furosemide (LASIX) 40 MG tablet Take 1 tablet (40 mg total) by mouth daily. 30 tablet 11   gabapentin (NEURONTIN) 100 MG capsule TAKE ONE CAPSULE BY MOUTH THREE TIMES DAILY 270 capsule 1   gabapentin (NEURONTIN) 800 MG tablet Take 1/2 to 1 tablet at Bedtime for Neuropathy & Sleep ( & continue 100 mg capsules 3 x /day ) 90 tablet 3   glimepiride (AMARYL) 4 MG tablet TAKE ONE TABLET BY MOUTH TWICE DAILY with a meal FOR DIABETES 180 tablet 0   HYDROcodone-acetaminophen (NORCO) 5-325 MG tablet Take 1 tablet by mouth every 4 (four) hours as needed. 10 tablet 0   hydrOXYzine (ATARAX) 25 MG tablet Take 1 tablet 3 x /day fr Nerves /Anxiety or 1 to 2 tablets at Bedtime as needed for Sleep 270 tablet 0   insulin glargine (LANTUS SOLOSTAR) 100 UNIT/ML Solostar Pen Inject 23 Units into the skin at bedtime. 15 mL 1    Insulin Pen Needle (CAREFINE PEN NEEDLES) 32G X 4 MM MISC Use 1x a day 100 each 3   ipratropium-albuterol (DUONEB) 0.5-2.5 (3) MG/3ML SOLN Take 3 mLs by nebulization every 4 (four) hours as needed. Max:6 doses per day 540 mL 3   lisinopril (ZESTRIL) 2.5 MG tablet TAKE ONE TABLET BY MOUTH NIGHTLY FOR BLOOD PRESSURE 180 tablet 0   nadolol (CORGARD) 20 MG tablet Take 1 tablet Daily for BP & Hereditary Familial Essential Tremor 90 tablet 3   ondansetron (ZOFRAN) 4 MG tablet Take 1 tablet (4 mg total) by mouth daily as needed for nausea or vomiting. 30 tablet 1   spironolactone (ALDACTONE) 25 MG tablet Take 1 tablet (25 mg total) by mouth daily. 30 tablet 11   traZODone (  DESYREL) 50 MG tablet Take 1/2 to 1 tablet at Bedtime if needed for Sleep 90 tablet 3   triamcinolone ointment (KENALOG) 0.5 % Apply 1 Application topically 2 (two) times daily. 15 g 1   No current facility-administered medications for this visit.     Review of Systems    ***.  All other systems reviewed and are otherwise negative except as noted above.    Physical Exam    VS:  There were no vitals taken for this visit. , BMI There is no height or weight on file to calculate BMI.     GEN: Well nourished, well developed, in no acute distress. HEENT: normal. Neck: Supple, no JVD, carotid bruits, or masses. Cardiac: RRR, no murmurs, rubs, or gallops. No clubbing, cyanosis, edema.  Radials/DP/PT 2+ and equal bilaterally.  Respiratory:  Respirations regular and unlabored, clear to auscultation bilaterally. GI: Soft, nontender, nondistended, BS + x 4. MS: no deformity or atrophy. Skin: warm and dry, no rash. Neuro:  Strength and sensation are intact. Psych: Normal affect.  Accessory Clinical Findings    ECG personally reviewed by me today - *** - no acute changes.   Lab Results  Component Value Date   WBC 8.9 07/22/2022   HGB 13.5 07/22/2022   HCT 40.1 07/22/2022   MCV 88.3 07/22/2022   PLT 290 07/22/2022   Lab  Results  Component Value Date   CREATININE 0.70 07/22/2022   BUN 15 07/22/2022   NA 134 (L) 07/22/2022   K 4.3 07/22/2022   CL 94 (L) 07/22/2022   CO2 33 (H) 07/22/2022   Lab Results  Component Value Date   ALT 28 07/02/2022   AST 35 07/02/2022   ALKPHOS 110 06/09/2022   BILITOT 0.3 07/02/2022   Lab Results  Component Value Date   CHOL 199 07/02/2022   HDL 58 07/02/2022   LDLCALC 113 (H) 07/02/2022   TRIG 161 (H) 07/02/2022   CHOLHDL 3.4 07/02/2022    Lab Results  Component Value Date   HGBA1C 7.8 (H) 07/02/2022    Assessment & Plan    1.  ***  No BP recorded.  {Refresh Note OR Click here to enter BP  :1}***   Lenna Sciara, NP 07/28/2022, 6:06 AM

## 2022-08-07 ENCOUNTER — Ambulatory Visit: Payer: PPO | Attending: Nurse Practitioner | Admitting: Nurse Practitioner

## 2022-08-07 ENCOUNTER — Encounter: Payer: Self-pay | Admitting: Nurse Practitioner

## 2022-08-07 VITALS — BP 136/70 | HR 92 | Ht <= 58 in | Wt 132.2 lb

## 2022-08-07 DIAGNOSIS — I2510 Atherosclerotic heart disease of native coronary artery without angina pectoris: Secondary | ICD-10-CM

## 2022-08-07 DIAGNOSIS — I10 Essential (primary) hypertension: Secondary | ICD-10-CM | POA: Diagnosis not present

## 2022-08-07 DIAGNOSIS — Z794 Long term (current) use of insulin: Secondary | ICD-10-CM | POA: Diagnosis not present

## 2022-08-07 DIAGNOSIS — I340 Nonrheumatic mitral (valve) insufficiency: Secondary | ICD-10-CM | POA: Diagnosis not present

## 2022-08-07 DIAGNOSIS — E785 Hyperlipidemia, unspecified: Secondary | ICD-10-CM

## 2022-08-07 DIAGNOSIS — E1159 Type 2 diabetes mellitus with other circulatory complications: Secondary | ICD-10-CM | POA: Diagnosis not present

## 2022-08-07 DIAGNOSIS — I5032 Chronic diastolic (congestive) heart failure: Secondary | ICD-10-CM

## 2022-08-07 MED ORDER — LISINOPRIL 5 MG PO TABS: 5.0000 mg | ORAL_TABLET | Freq: Every day | ORAL | 3 refills | Status: DC

## 2022-08-07 NOTE — Patient Instructions (Signed)
Medication Instructions:  Increase Lisinopril 5 mg daily  *If you need a refill on your cardiac medications before your next appointment, please call your pharmacy*   Lab Work: Your physician recommends that you return for lab work in: 2 weeks. BMET  If you have labs (blood work) drawn today and your tests are completely normal, you will receive your results only by: [place] (if you have MyChart) OR A paper copy in the mail If you have any lab test that is abnormal or we need to change your treatment, we will call you to review the results.   Testing/Procedures: NONE ordered at this time of appointment    Follow-Up: At [facility], you and your health needs are our priority.  As part of our continuing mission to provide you with exceptional heart care, we have created designated Provider Care Teams.  These Care Teams include your primary Cardiologist (physician) and Advanced Practice Providers (APPs -  Physician Assistants and Nurse Practitioners) who all work together to provide you with the care you need, when you need it.  We recommend signing up for the patient portal called "MyChart".  Sign up information is provided on this After Visit Summary.  MyChart is used to connect with patients for Virtual Visits (Telemedicine).  Patients are able to view lab/test results, encounter notes, upcoming appointments, etc.  Non-urgent messages can be sent to your provider as well.   To learn more about what you can do with MyChart, go to [email or web address].    Your next appointment:   2 month(s) Diona Browner DNP) 6 months ( Dr. Ellyn Hack)  Provider:   Diona Browner, NP        Other Instructions

## 2022-08-07 NOTE — Progress Notes (Signed)
Office Visit    Patient Name: Sara Lamb Date of Encounter: 08/07/2022  Primary Care Provider:  Unk Pinto, MD Primary Cardiologist:  Glenetta Hew, MD  Chief Complaint    72 year old female with a history of moderate nonobstructive CAD, chronic diastolic heart failure, mitral valve regurgitation, hypertension, hyperlipidemia, type 2 diabetes, and former tobacco use who presents for follow-up related to CAD and heart failure.  Past Medical History    Past Medical History:  Diagnosis Date   Anxiety    Anxiety disorder    Cirrhosis ([place])    COPD (chronic obstructive pulmonary disease) ([place])    followed by pcp--  last exacerbation 11/ 2017   Coronary artery disease, non-occlusive 03/2018   Cardiac Cath: Moderate (60%) proximal-mid RCA (FFR 0.89 => NEGATIVE / not physiologically significant).  Normal EF and EDP.   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--  "gets real sharp stabbing pain over eye and speech screws up"  pt states takes excederin migraine and lies down (pt stated was told if she "if come to ER again they would send her to psych ward")   Hyperlipidemia    Hypertension    OA (osteoarthritis)    back, hips, knees, feet   Osteoporosis    Polyp of Lamb, hyperplastic    Pulmonary nodules    per CT chest 08-28-2016  -- bilateral upper lobe nodules   S/P right hip fracture 09/06/2019   Stroke ([place])    Tubular adenoma of Lamb    Type 2 diabetes mellitus ([place])    followed by pcp--  last A1c 9.1 on 08-01-2016   UTI (urinary tract infection)    Wears dentures    Past Surgical History:  Procedure Laterality Date   BILATERAL SALPINGOOPHORECTOMY  1997   via Laparoscopy   CHOLECYSTECTOMY N/A 09/30/2016   Procedure: LAPAROSCOPIC CHOLECYSTECTOMY;  Surgeon: Mickeal Skinner, MD;  Location[facility];  Service: General;  Laterality: N/A;   INTRAVASCULAR PRESSURE  WIRE/FFR STUDY N/A 04/12/2018   Procedure: INTRAVASCULAR PRESSURE WIRE/FFR STUDY;  Surgeon: Leonie Man, MD;  Location: [facility];  Service: Cardiovascular;  Laterality: N/A;   KNEE ARTHROSCOPY Right 11/2014   LEFT HEART CATH AND CORONARY ANGIOGRAPHY N/A 04/12/2018   Procedure: LEFT HEART CATH AND CORONARY ANGIOGRAPHY;  Surgeon: Leonie Man, MD;  Location: [facility];  Service: Cardiovascular;  Laterality: N/A;   NM MYOVIEW LTD  07/03/2020    EF>65%.  No EKG changes.  LOW RISK.  No ischemia or infarction.   ORIF HIP FRACTURE Right 2021   [facility], Dr. Ian Malkin   TRANSTHORACIC ECHOCARDIOGRAM  02/2018    Normal LV function.  EF 60 to 65%.  No RWMMA.  GRII DD.  Moderate LA dilation.   VAGINAL HYSTERECTOMY  age 58    Allergies  Allergies  Allergen Reactions   Actos [Pioglitazone] Other (See Comments)    Patient stated that it elevated her BGL   Glyburide Other (See Comments)    Patient stated that it elevated her BGL   Metformin And Related     "metformin caused mini-strokes" and ELEVATED BS and also GLYBURIDE, ONGLYZA, ACTOS, INVOKANA caused elevated BS   Onglyza [Saxagliptin] Other (See Comments)    Patient stated that it elevated her BGL   Rosuvastatin Diarrhea   Insulins Rash  Janumet [Sitagliptin-Metformin Hcl] Rash and Other (See Comments)    Shakes/ rash   Penicillins Rash and Other (See Comments)   Spironolactone Rash     Labs/Other Studies Reviewed    The following studies were reviewed today:  LHC 04/12/18 Prox RCA to Mid RCA lesion is 60% stenosed. FFR negative (0 29). The left ventricular systolic function is normal. The left ventricular ejection fraction is greater than 65% by visual estimate. LV end diastolic pressure is normal. There is no mitral valve regurgitation with notable mitral annular calcification   Moderate single-vessel disease with FFR negative lesion proximal to mid RCA.   Plan: Discharge home after bedrest and  TR band removal Will likely titrate medications -continue Imdur. Aggressive risk factor modification Smoking cessation counseling   Recommend Aspirin 58m daily for moderate CAD    Recent Labs: 07/02/2022: ALT 28; Magnesium 1.6; TSH 1.27 07/22/2022: BUN 15; Creat 0.70; Hemoglobin 13.5; Platelets 290; Potassium 4.3; Sodium 134  Recent Lipid Panel    Component Value Date/Time   CHOL 199 07/02/2022 1454   TRIG 161 (H) 07/02/2022 1454   HDL 58 07/02/2022 1454   CHOLHDL 3.4 07/02/2022 1454   VLDL 39 (H) 11/23/2016 1300   LDLCALC 113 (H) 07/02/2022 1454    History of Present Illness    72year old female with the above past medical history including moderate nonobstructive CAD, chronic diastolic heart failure, mitral valve regurgitation, hypertension, hyperlipidemia, type 2 diabetes, and former tobacco use.   She was initially referred to cardiology by her PCP for the evaluation of chest pain. Cardiac catheterization in 03/2018 revealed proximal RCA to mid RCA lesion with 60% stenosis, negative FFR, LV EF 65% by visual estimate, normal LVEDP.  Medical management was advised.  She was last seen in the office on 07/03/2022 and noted a 1 to 251-monthistory of lower extremity edema, weight gain.  She was started on furosemide and spironolactone.  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   She presents today for follow-up accompanied by her husband.  Since her last visit he has been stable from a cardiac standpoint.  She notes significant improvement in her bilateral lower extremity edema.  She was unable to tolerate spironolactone as it caused a rash. She continues to take her Lasix.  She has stable chronic dyspnea, she denies chest pain.  She  continues to smoke 1 pack/day.  We discussed the results of her echocardiogram.  Patient states she is not interested in having surgery to repair her mitral valve.  Home Medications    Current Outpatient Medications  Medication Sig Dispense Refill   acetaminophen (TYLENOL) 500 MG tablet Take 500 mg by mouth as directed. Take 2 tablets three times ad ay as needed     aspirin EC 81 MG tablet Take 81 mg by mouth daily.     divalproex (DEPAKOTE) 250 MG DR tablet Take 1 tablet 3 x /day for Hereditary tremor or "Shaking Palsy" 270 tablet 1   DULoxetine (CYMBALTA) 20 MG capsule Take  1 capsule  Daily  for Chronic Pain 90 capsule 3   furosemide (LASIX) 40 MG tablet Take 1 tablet (40 mg total) by mouth daily. 30 tablet 11   gabapentin (NEURONTIN) 100 MG capsule TAKE ONE CAPSULE BY MOUTH THREE TIMES DAILY 270 capsule  1   gabapentin (NEURONTIN) 800 MG tablet Take 1/2 to 1 tablet at Bedtime for Neuropathy & Sleep ( & continue 100 mg capsules 3 x /day ) 90 tablet 3   glimepiride (AMARYL) 4 MG tablet TAKE ONE TABLET BY MOUTH TWICE DAILY with a meal FOR DIABETES 180 tablet 0   HYDROcodone-acetaminophen (NORCO) 5-325 MG tablet Take 1 tablet by mouth every 4 (four) hours as needed. 10 tablet 0   hydrOXYzine (ATARAX) 25 MG tablet Take 1 tablet 3 x /day fr Nerves /Anxiety or 1 to 2 tablets at Bedtime as needed for Sleep 270 tablet 0   insulin glargine (LANTUS SOLOSTAR) 100 UNIT/ML Solostar Pen Inject 23 Units into the skin at bedtime. 15 mL 1   Insulin Pen Needle (CAREFINE PEN NEEDLES) 32G X 4 MM MISC Use 1x a day 100 each 3   ipratropium-albuterol (DUONEB) 0.5-2.5 (3) MG/3ML SOLN Take 3 mLs by nebulization every 4 (four) hours as needed. Max:6 doses per day 540 mL 3   lisinopril (ZESTRIL) 5 MG tablet Take 1 tablet (5 mg total) by mouth daily. 90 tablet 3   nadolol (CORGARD) 20 MG tablet Take 1 tablet Daily for BP & Hereditary Familial Essential Tremor 90 tablet 3   ondansetron (ZOFRAN) 4 MG tablet Take 1 tablet  (4 mg total) by mouth daily as needed for nausea or vomiting. 30 tablet 1   traZODone (DESYREL) 50 MG tablet Take 1/2 to 1 tablet at Bedtime if needed for Sleep 90 tablet 3   No current facility-administered medications for this visit.     Review of Systems    She denies chest pain, palpitations, pnd, orthopnea, n, v, dizziness, syncope, weight gain, or early satiety. All other systems reviewed and are otherwise negative except as noted above.   Physical Exam    VS:  BP 136/70   Pulse 92   Ht _0  (1.473 m)   Wt 132 lb 3.2 oz (60 kg)   SpO2 99%   BMI 27.63 kg/m   GEN: Well nourished, well developed, in no acute distress. HEENT: normal. Neck: Supple, no JVD, carotid bruits, or masses. Cardiac: RRR, no murmurs, rubs, or gallops. No clubbing, cyanosis, edema.  Radials/DP/PT 2+ and equal bilaterally.  Respiratory:  Respirations regular and unlabored, clear to auscultation bilaterally. GI: Soft, nontender, nondistended, BS + x 4. MS: no deformity or atrophy. Skin: warm and dry, no rash. Neuro:  Strength and sensation are intact. Psych: Normal affect.  Accessory Clinical Findings    ECG personally reviewed by me today -No EKG in office today.   Lab Results  Component Value Date   WBC 8.9 07/22/2022   HGB 13.5 07/22/2022   HCT 40.1 07/22/2022   MCV 88.3 07/22/2022   PLT 290 07/22/2022   Lab Results  Component Value Date   CREATININE 0.70 07/22/2022   BUN 15 07/22/2022   NA 134 (L) 07/22/2022   K 4.3 07/22/2022   CL 94 (L) 07/22/2022   CO2 33 (H) 07/22/2022   Lab Results  Component Value Date   ALT 28 07/02/2022   AST 35 07/02/2022   ALKPHOS 110 06/09/2022   BILITOT 0.3 07/02/2022   Lab Results  Component Value Date   CHOL 199 07/02/2022   HDL 58 07/02/2022   LDLCALC 113 (H) 07/02/2022   TRIG 161 (H) 07/02/2022   CHOLHDL 3.4 07/02/2022    Lab Results  Component Value Date   HGBA1C 7.8 (H) 07/02/2022    Assessment & Plan  1. 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  She was started on Lasix and spironolactone.  She did not tolerate spironolactone as it caused a rash.  Her swelling has improved significantly with Lasix.  She has nonpitting bilateral lower extremity edema.  She denies worsening dyspnea, PND, orthopnea, weight gain.  Continue Lasix.  2. Nonobstructive CAD: Cath in 03/2018 revealed proximal RCA to mid RCA lesion with 60% stenosis, negative FFR, LV EF 65% by visual estimate, normal LVEDP.  Medical management was advised.  Stable chronic dyspnea likely in the setting of COPD, denies chest pain.  Continue aspirin, lisinopril.  She declines statin therapy.  3. 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  She has had recent symptoms of heart failure as above.  We discussed possible TEE, however, patient declines any further testing at this time as she states she is not interested in surgery.  4. Hypertension: BP slightly elevated in office today.  She notes intermittently elevated BP at home with SBP in the 140s to 150s.  Will increase lisinopril to 5 mg daily.  Will check BMET in 2 weeks.  Otherwise, continue current antihypertensive regimen.   5. Hyperlipidemia: LDL was 113 in 06/2022.  She declines statin therapy.  Possible referral to lipid clinic Pharm.D., however, patient declines today.  6. Type 2 diabetes: A1c was 7.8 in 06/2022.  Monitored and managed per PCP.    7. Disposition: Follow-up in 2 months with APP, 6 months with Dr. Ellyn Hack.     Lenna Sciara, NP 08/07/2022, 5:30 PM

## 2022-08-11 ENCOUNTER — Telehealth: Payer: Self-pay

## 2022-08-11 NOTE — Telephone Encounter (Signed)
Pt was seen in office on 08/07/2022 by Diona Browner NP. Pt was advised to complete lab work in 2 weeks. Pt only wants to complete labs at her PCP office. Lab orders were faxed to Dr. Idell Pickles office at [phone]. Dr. Idell Pickles office is aware labs are needed and will call pt to schedule a time to complete labs. Dr. Idell Pickles office number is [phone].

## 2022-08-12 NOTE — Telephone Encounter (Signed)
Pt pcp office called stating pt wants to know if its okay for her to have labs 08/31/22 which isn't exactly two weeks but that day is more convenient for her. She ask that RN give pt a call to let her know if this is okay or not.

## 2022-08-12 NOTE — Telephone Encounter (Signed)
Noted. Spoke with Sara Lamb. Sara Lamb is aware that it's ok to complete labs on 08/31/22 ok per Diona Browner, NP.

## 2022-08-12 NOTE — Telephone Encounter (Signed)
Will forward to emily to make sure it is okay

## 2022-08-13 ENCOUNTER — Other Ambulatory Visit: Payer: Self-pay | Admitting: Nurse Practitioner

## 2022-08-13 DIAGNOSIS — L299 Pruritus, unspecified: Secondary | ICD-10-CM

## 2022-08-18 ENCOUNTER — Other Ambulatory Visit: Payer: Self-pay | Admitting: Nurse Practitioner

## 2022-08-18 DIAGNOSIS — E1122 Type 2 diabetes mellitus with diabetic chronic kidney disease: Secondary | ICD-10-CM

## 2022-08-31 ENCOUNTER — Other Ambulatory Visit: Payer: Self-pay

## 2022-08-31 ENCOUNTER — Ambulatory Visit (INDEPENDENT_AMBULATORY_CARE_PROVIDER_SITE_OTHER): Payer: PPO

## 2022-08-31 DIAGNOSIS — E1151 Type 2 diabetes mellitus with diabetic peripheral angiopathy without gangrene: Secondary | ICD-10-CM

## 2022-08-31 DIAGNOSIS — E1122 Type 2 diabetes mellitus with diabetic chronic kidney disease: Secondary | ICD-10-CM

## 2022-08-31 DIAGNOSIS — N182 Chronic kidney disease, stage 2 (mild): Secondary | ICD-10-CM | POA: Diagnosis not present

## 2022-08-31 DIAGNOSIS — Z79899 Other long term (current) drug therapy: Secondary | ICD-10-CM

## 2022-08-31 MED ORDER — GLIMEPIRIDE 4 MG PO TABS: ORAL_TABLET | ORAL | 0 refills | Status: DC

## 2022-08-31 NOTE — Progress Notes (Signed)
The patient came in today for repeat lab work. She voices no new complaints or concerns.

## 2022-09-01 LAB — BASIC METABOLIC PANEL WITH GFR
BUN/Creatinine Ratio: 22 (calc) (ref 6–22)
BUN: 22 mg/dL (ref 7–25)
CO2: 31 mmol/L (ref 20–32)
Calcium: 8.6 mg/dL (ref 8.6–10.4)
Chloride: 97 mmol/L — ABNORMAL LOW (ref 98–110)
Creat: 1.01 mg/dL — ABNORMAL HIGH (ref 0.60–1.00)
Glucose, Bld: 140 mg/dL — ABNORMAL HIGH (ref 65–99)
Potassium: 4.7 mmol/L (ref 3.5–5.3)
Sodium: 135 mmol/L (ref 135–146)
eGFR: 60 mL/min/{1.73_m2} (ref >=60)

## 2022-09-02 ENCOUNTER — Telehealth: Payer: Self-pay | Admitting: Nurse Practitioner

## 2022-09-02 NOTE — Telephone Encounter (Signed)
Patient states She is out of Gabapentin and its too soon to refill. She has been taking 2 pills in AM, 2 pills in the afternoon, and 2 pills at night.Marland KitchenMarland KitchenWould like a call from the nurse.

## 2022-09-02 NOTE — Telephone Encounter (Signed)
She can only take 1 tab three times a day of the 800 mg Gabapentin.  Max dose is 2400 mg a day

## 2022-09-03 ENCOUNTER — Other Ambulatory Visit: Payer: Self-pay | Admitting: Nurse Practitioner

## 2022-09-03 DIAGNOSIS — G4701 Insomnia due to medical condition: Secondary | ICD-10-CM

## 2022-09-03 DIAGNOSIS — E1140 Type 2 diabetes mellitus with diabetic neuropathy, unspecified: Secondary | ICD-10-CM

## 2022-09-03 MED ORDER — GABAPENTIN 800 MG PO TABS: ORAL_TABLET | ORAL | 3 refills | Status: DC

## 2022-09-03 NOTE — Telephone Encounter (Signed)
Patient aware it has been refilled. Told to take as directed and Hinton Dyer will not refill it early again.

## 2022-09-03 NOTE — Telephone Encounter (Signed)
I refilled her prescription , take as directed

## 2022-09-23 ENCOUNTER — Other Ambulatory Visit: Payer: Self-pay | Admitting: Nurse Practitioner

## 2022-10-06 ENCOUNTER — Ambulatory Visit: Payer: PPO | Attending: Nurse Practitioner | Admitting: Nurse Practitioner

## 2022-10-06 VITALS — BP 172/72 | HR 85 | Ht <= 58 in | Wt 139.4 lb

## 2022-10-06 DIAGNOSIS — I10 Essential (primary) hypertension: Secondary | ICD-10-CM | POA: Diagnosis not present

## 2022-10-06 DIAGNOSIS — Z720 Tobacco use: Secondary | ICD-10-CM

## 2022-10-06 DIAGNOSIS — I5032 Chronic diastolic (congestive) heart failure: Secondary | ICD-10-CM | POA: Diagnosis not present

## 2022-10-06 DIAGNOSIS — Z794 Long term (current) use of insulin: Secondary | ICD-10-CM | POA: Diagnosis not present

## 2022-10-06 DIAGNOSIS — E785 Hyperlipidemia, unspecified: Secondary | ICD-10-CM | POA: Diagnosis not present

## 2022-10-06 DIAGNOSIS — E1159 Type 2 diabetes mellitus with other circulatory complications: Secondary | ICD-10-CM

## 2022-10-06 DIAGNOSIS — I2510 Atherosclerotic heart disease of native coronary artery without angina pectoris: Secondary | ICD-10-CM | POA: Diagnosis not present

## 2022-10-06 DIAGNOSIS — I340 Nonrheumatic mitral (valve) insufficiency: Secondary | ICD-10-CM | POA: Diagnosis not present

## 2022-10-06 NOTE — Patient Instructions (Signed)
Medication Instructions:  Your physician recommends that you continue on your current medications as directed. Please refer to the Current Medication list given to you today.   *If you need a refill on your cardiac medications before your next appointment, please call your pharmacy*   Lab Work: NONE ordered at this time of appointment   If you have labs (blood work) drawn today and your tests are completely normal, you will receive your results only by: [place] (if you have MyChart) OR A paper copy in the mail If you have any lab test that is abnormal or we need to change your treatment, we will call you to review the results.   Testing/Procedures: NONE ordered at this time of appointment     Follow-Up: At [facility], you and your health needs are our priority.  As part of our continuing mission to provide you with exceptional heart care, we have created designated Provider Care Teams.  These Care Teams include your primary Cardiologist (physician) and Advanced Practice Providers (APPs -  Physician Assistants and Nurse Practitioners) who all work together to provide you with the care you need, when you need it.  We recommend signing up for the patient portal called "MyChart".  Sign up information is provided on this After Visit Summary.  MyChart is used to connect with patients for Virtual Visits (Telemedicine).  Patients are able to view lab/test results, encounter notes, upcoming appointments, etc.  Non-urgent messages can be sent to your provider as well.   To learn more about what you can do with MyChart, go to [email or web address].    Your next appointment:   2 month(s) Diona Browner NP) 6-7 months (Dr. Ellyn Hack)  Provider:   Glenetta Hew, MD  or Diona Browner, NP        Other Instructions Monitor Blood pressure. Report blood pressure consistently greater than 140/80.

## 2022-10-06 NOTE — Progress Notes (Unsigned)
Office Visit    Patient Name: Sara Lamb Date of Encounter: 10/06/2022  Primary Care Provider:  Unk Pinto, MD Primary Cardiologist:  Glenetta Hew, MD  Chief Complaint    72 year old female with a history of moderate nonobstructive CAD, chronic diastolic heart failure, mitral valve regurgitation, hypertension, hyperlipidemia, type 2 diabetes, and former tobacco use who presents for follow-up related to CAD and heart failure.   Past Medical History    Past Medical History:  Diagnosis Date   Anxiety    Anxiety disorder    Cirrhosis ([place])    COPD (chronic obstructive pulmonary disease) ([place])    followed by pcp--  last exacerbation 11/ 2017   Coronary artery disease, non-occlusive 03/2018   Cardiac Cath: Moderate (60%) proximal-mid RCA (FFR 0.89 => NEGATIVE / not physiologically significant).  Normal EF and EDP.   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--  "gets real sharp stabbing pain over eye and speech screws up"  pt states takes excederin migraine and lies down (pt stated was told if she "if come to ER again they would send her to psych ward")   Hyperlipidemia    Hypertension    OA (osteoarthritis)    back, hips, knees, feet   Osteoporosis    Polyp of colon, hyperplastic    Pulmonary nodules    per CT chest 08-28-2016  -- bilateral upper lobe nodules   S/P right hip fracture 09/06/2019   Stroke ([place])    Tubular adenoma of colon    Type 2 diabetes mellitus ([place])    followed by pcp--  last A1c 9.1 on 08-01-2016   UTI (urinary tract infection)    Wears dentures    Past Surgical History:  Procedure Laterality Date   BILATERAL SALPINGOOPHORECTOMY  1997   via Laparoscopy   CHOLECYSTECTOMY N/A 09/30/2016   Procedure: LAPAROSCOPIC CHOLECYSTECTOMY;  Surgeon: Mickeal Skinner, MD;  Location: [facility];  Service: General;  Laterality: N/A;   INTRAVASCULAR PRESSURE  WIRE/FFR STUDY N/A 04/12/2018   Procedure: INTRAVASCULAR PRESSURE WIRE/FFR STUDY;  Surgeon: Leonie Man, MD;  Location: [facility];  Service: Cardiovascular;  Laterality: N/A;   KNEE ARTHROSCOPY Right 11/2014   LEFT HEART CATH AND CORONARY ANGIOGRAPHY N/A 04/12/2018   Procedure: LEFT HEART CATH AND CORONARY ANGIOGRAPHY;  Surgeon: Leonie Man, MD;  Location: [place] [facility];  Service: Cardiovascular;  Laterality: N/A;   NM MYOVIEW LTD  07/03/2020    EF>65%.  No EKG changes.  LOW RISK.  No ischemia or infarction.   ORIF HIP FRACTURE Right 2021   [facility], Dr. Ian Malkin   TRANSTHORACIC ECHOCARDIOGRAM  02/2018    Normal LV function.  EF 60 to 65%.  No RWMMA.  GRII DD.  Moderate LA dilation.   VAGINAL HYSTERECTOMY  age 89    Allergies  Allergies  Allergen Reactions   Actos [Pioglitazone] Other (See Comments)    Patient stated that it elevated her BGL   Glyburide Other (See Comments)    Patient stated that it elevated her BGL   Metformin And Related     "metformin caused mini-strokes" and ELEVATED BS and also GLYBURIDE, ONGLYZA, ACTOS, INVOKANA caused elevated BS   Onglyza [Saxagliptin] Other (See Comments)    Patient stated that it elevated her BGL   Rosuvastatin Diarrhea   Insulins Rash  Janumet [Sitagliptin-Metformin Hcl] Rash and Other (See Comments)    Shakes/ rash   Penicillins Rash and Other (See Comments)   Spironolactone Rash     Labs/Other Studies Reviewed    The following studies were reviewed today:  LHC 04/12/18 Prox RCA to Mid RCA lesion is 60% stenosed. FFR negative (0 29). The left ventricular systolic function is normal. The left ventricular ejection fraction is greater than 65% by visual estimate. LV end diastolic pressure is normal. There is no mitral valve regurgitation with notable mitral annular calcification   Moderate single-vessel disease with FFR negative lesion proximal to mid RCA.   Plan: Discharge home after bedrest and  TR band removal Will likely titrate medications -continue Imdur. Aggressive risk factor modification Smoking cessation counseling   Recommend Aspirin '81mg'$  daily for moderate CAD  Recent Labs: 07/02/2022: ALT 28; Magnesium 1.6; TSH 1.27 07/22/2022: Hemoglobin 13.5; Platelets 290 08/31/2022: BUN 22; Creat 1.01; Potassium 4.7; Sodium 135  Recent Lipid Panel    Component Value Date/Time   CHOL 199 07/02/2022 1454   TRIG 161 (H) 07/02/2022 1454   HDL 58 07/02/2022 1454   CHOLHDL 3.4 07/02/2022 1454   VLDL 39 (H) 11/23/2016 1300   LDLCALC 113 (H) 07/02/2022 1454    History of Present Illness    72 year old female with the above past medical history including moderate nonobstructive CAD, chronic diastolic heart failure, mitral valve regurgitation, hypertension, hyperlipidemia, type 2 diabetes, and former tobacco use.   She was initially referred to cardiology by her PCP for the evaluation of chest pain. Cardiac catheterization in 03/2018 revealed proximal RCA to mid RCA lesion with 60% stenosis, negative FFR, LV EF 65% by visual estimate, normal LVEDP.  Medical management was advised.  At her follow-up visit in 06/2022 she noted a 1 to 19-monthhistory of lower extremity edema, weight gain.  She was started on furosemide and spironolactone.  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  She was last seen in the office on 08/07/2022 and was stable from a cardiac standpoint.  She noted significant improvement in her lower extremity edema with Lasix. She declined further testing/surgery for her mitral valve regurgitation.  BP was mildly elevated. Unfortunately,  she did not tolerate spironolactone due to rash. Her lisinopril was increased to 5 mg daily.    She presents today for follow-up accompanied by her husband.  Since her last visit she has been stable from a cardiac standpoint. Her BP is elevated.  She smoked a cigarette prior to coming into the office today.  He has been generally well-controlled at home.  She notes a mild headache, otherwise, she reports feeling well.  Home Medications    Current Outpatient Medications  Medication Sig Dispense Refill   acetaminophen (TYLENOL) 500 MG tablet Take 500 mg by mouth as directed. Take 2 tablets three times ad ay as needed     aspirin EC 81 MG tablet Take 81 mg by mouth daily.     divalproex (DEPAKOTE) 250 MG DR tablet Take 1 tablet 3 x /day for Hereditary tremor or "Shaking Palsy" 270 tablet 1   DULoxetine (CYMBALTA) 20 MG capsule Take  1 capsule  Daily  for Chronic Pain 90 capsule 3   furosemide (LASIX) 40 MG tablet Take 1 tablet (40 mg total)  by mouth daily. 30 tablet 11   gabapentin (NEURONTIN) 800 MG tablet Take 1/2 to 1 tablet at Bedtime for Neuropathy & Sleep ( & continue 100 mg capsules 3 x /day ) 90 tablet 3   glimepiride (AMARYL) 4 MG tablet TAKE ONE TABLET BY MOUTH TWICE DAILY with a meal FOR DIABETES 180 tablet 0   hydrOXYzine (ATARAX) 25 MG tablet Take 1 tablet by mouth 3 times daily for Nerves /Anxiety or 1 to 2 tablets at Bedtime as needed for Sleep 270 tablet 0   insulin glargine (LANTUS SOLOSTAR) 100 UNIT/ML Solostar Pen Inject 23 Units into the skin at bedtime. 15 mL 1   Insulin Pen Needle (CAREFINE PEN NEEDLES) 32G X 4 MM MISC Use 1x a day 100 each 3   ipratropium-albuterol (DUONEB) 0.5-2.5 (3) MG/3ML SOLN Take 3 mLs by nebulization every 4 (four) hours as needed. Max:6 doses per day 540 mL 3   lisinopril (ZESTRIL) 5 MG tablet Take 1 tablet (5 mg total) by mouth daily. 90 tablet 3   nadolol (CORGARD) 20 MG tablet Take 1 tablet Daily for BP & Hereditary Familial Essential Tremor 90 tablet 3   ondansetron (ZOFRAN) 4 MG tablet Take 1 tablet (4 mg total) by mouth daily as needed for  nausea or vomiting. 30 tablet 1   traZODone (DESYREL) 50 MG tablet Take 1/2 to 1 tablet at Bedtime if needed for Sleep 90 tablet 3   No current facility-administered medications for this visit.     Review of Systems    She denies chest pain, palpitations, dyspnea, pnd, orthopnea, n, v, dizziness, syncope, edema, weight gain, or early satiety. All other systems reviewed and are otherwise negative except as noted above.   Physical Exam    VS:  BP (!) 172/72   Pulse 85   Ht '4\' 10"'$  (1.473 m)   Wt 139 lb 6.4 oz (63.2 kg)   SpO2 99%   BMI 29.13 kg/m   GEN: Well nourished, well developed, in no acute distress. HEENT: normal. Neck: Supple, no JVD, carotid bruits, or masses. Cardiac: RRR, no murmurs, rubs, or gallops. No clubbing, cyanosis, edema.  Radials/DP/PT 2+ and equal bilaterally.  Respiratory:  Respirations regular and unlabored, clear to auscultation bilaterally. GI: Soft, nontender, nondistended, BS + x 4. MS: no deformity or atrophy. Skin: warm and dry, no rash. Neuro:  Strength and sensation are intact. Psych: Normal affect.  Accessory Clinical Findings    ECG personally reviewed by me today - No EKG in office today.  Lab Results  Component Value Date   WBC 8.9 07/22/2022   HGB 13.5 07/22/2022   HCT 40.1 07/22/2022   MCV 88.3 07/22/2022   PLT 290 07/22/2022   Lab Results  Component Value Date   CREATININE 1.01 (H) 08/31/2022   BUN 22 08/31/2022   NA 135 08/31/2022   K 4.7 08/31/2022   CL 97 (L) 08/31/2022   CO2 31 08/31/2022   Lab Results  Component Value Date   ALT 28 07/02/2022   AST 35 07/02/2022   ALKPHOS 110 06/09/2022   BILITOT 0.3 07/02/2022   Lab Results  Component Value Date   CHOL 199 07/02/2022   HDL 58 07/02/2022   LDLCALC 113 (H) 07/02/2022   TRIG 161 (H) 07/02/2022   CHOLHDL 3.4 07/02/2022    Lab Results  Component Value Date   HGBA1C 7.8 (H) 07/02/2022    Assessment & Plan    1. 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  She was started on Lasix and spironolactone.  She did not tolerate spironolactone as it caused a rash.  Her swelling has improved significantly with Lasix.  Euvolemic and well compensated on exam.  Continue Lasix.   2. Nonobstructive CAD: Cath in 03/2018 revealed proximal RCA to mid RCA lesion with 60% stenosis, negative FFR, LV EF 65% by visual estimate, normal LVEDP. Medical management was advised.  Stable chronic dyspnea likely in the setting of COPD, denies chest pain.  Continue aspirin, lisinopril.  She declines statin therapy.   3. 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  She has had recent symptoms of heart failure as above. Patient declines any further testing at this time as she states she is not interested in pursuing surgery.   4. Hypertension: Elevated in office today. Generally well-controlled at home.  Will have her keep a BP log and report BP consistent greater than 140/80.  If BP remains elevated below goal, consider transitioning from lisinopril to olmesartan.  Otherwise, continue current antihypertensive regimen.    5. Hyperlipidemia: LDL was 113 in 06/2022.  She declines statin therapy or referral to lipid clinic pharmD.   6. Type 2 diabetes: A1c was 7.8 in 06/2022.  Monitored and managed per PCP.    7. Tobacco use: She continues to smoke. Full cessation advised.   8. Disposition: Follow-up in 2 months with APP, 6 months with Dr. Ellyn Hack.  Lenna Sciara, NP 10/08/2022, 11:53 AM

## 2022-10-07 ENCOUNTER — Telehealth: Payer: Self-pay | Admitting: Cardiology

## 2022-10-07 NOTE — Telephone Encounter (Signed)
Pt c/o medication issue:  1. Name of Medication: lisinopril (ZESTRIL) 5 MG tablet   2. How are you currently taking this medication (dosage and times per day)? Take 1 tablet (5 mg total) by mouth daily.   3. Are you having a reaction (difficulty breathing--STAT)? No  4. What is your medication issue? Pt hasn't taken their medication yet, but yesterday they had an appointment and had spoken about changing the Lisinopril to another medication. Pt is unsure if they should go ahead and take the Lisinopril or if the need to get a different medication

## 2022-10-07 NOTE — Telephone Encounter (Signed)
Patient was not sure if she should take her lisinopril this morning.  I  noted that per her last OV    Will have her keep a BP log and report BP consistent greater than 140/80. If BP remains elevated below goal, consider transitioning from lisinopril to olmesartan. Patient will keep a log and call or upload results in approx a week.  She will continue the lisinopril until advised other wise.

## 2022-10-08 ENCOUNTER — Encounter: Payer: Self-pay | Admitting: Nurse Practitioner

## 2022-10-12 NOTE — Progress Notes (Unsigned)
MEDICARE ANNUAL WELLNESS VISIT AND 66month FOLLOW UP  Assessment:   Sara Lamb was seen today for medicare wellness.  Diagnoses and all orders for this visit: Has CPE  scheduled for next week will hold labs until then  Encounter for Medicare annual wellness exam Declines all vaccines, mammogram, colon cancer screening, DEXA follow up despite discussion of risks. She is requesting minimal interventions. She has also refused further low dose CT scans  Atherosclerosis of abdominal aorta (HCC) Control blood pressure, cholesterol, glucose, increase exercise.   Chronic diastolic heart failure(HCC) Continue medications Continue to follow with cardiology  CAD STOP SMOKING Continue medications: Aspirin 81mg , Lisinopril 5mg , nadolol 20mg  Follows with Cardiology, Sara Lamb Control blood pressure, cholesterol, glucose, increase exercise.   Essential hypertension Monitor blood pressure at home; call if consistently over 130/80 Continue DASH diet.   Reminder to go to the ER if any CP, SOB, nausea, dizziness, severe HA, changes vision/speech, left arm numbness and tingling and jaw pain. 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 Don't take Glimepiride if blood sugar is less than 120 Long discussion of eating more regularly to help control blood sugars Discussed glucose monitoring at length, non-compliant with checking. Denies any barriers despite questioning. Discussed dietary and exercise modifications.  Tremor Continue nadolol and monitor symptoms  Liver cirrhosis secondary to NASH Sara Lamb) Follows with GI for this.   Chronic obstructive pulmonary disease, unspecified COPD type (Sara Lamb) Denies sx; STOP smoking; monitor   Hyperlipidemia associated with type 2 diabetes mellitus (HCC)/ Statin intolerance Had tried Rosuvastatin and  claims it made her sick, refuses statin medication Discussed dietary and exercise modifications. -     Lipid panel  Osteoporosis, unspecified osteoporosis type, unspecified pathological fracture presence Due for DEXA scan -states she had with her insurance company Discussed Calcium and Vit D supplementation  Tobacco use disorder Discussed smoking cessation.  Reports she is smoking 1 pack a day  She is not ready to quit -lung cancer screening with low dose CT discussed as recommended by guidelines based on age, number of pack year history.  Discussed risks of screening including but not limited to false positives on xray, further testing or consultation with specialist, and possible false negative CT as well. Refuses another CT scan.   Chronic hyponatremia Monitor;   Diabetic painful peripheral neuropathy(HCC) Use walker always Continue Gabapentin as directed Be very careful with ambulation   Anxiety Stress management techniques discussed, increase water, good sleep hygiene discussed, increase exercise, and increase veggies.  Continue Sara Lamb 1/2-1 tab 2-3 times a day. Limit to 5 days a week to avoid addiction and increased risk of dementia  Vitamin D deficiency Continue Vit D supplementation to maintain value in therapeutic level of 60-100   Poor compliance with medication Discussed this at length with patient.   Edema Strongly encourages to keep legs elevated and use Ace wraps daily for compression Takes Lasix 40 mg daily, did not take today because had long drive to office  Over 40 minutes of exam, counseling, chart review and critical decision making was performed Future Appointments  Date Time Provider [place]  12/09/2022  1:30 PM Sara Sciara, Sara Lamb [facility] None  07/06/2023  2:00 PM Sara Rossetti, Sara Lamb [facility] None     Plan:   During the course of the visit the  patient was educated and counseled about appropriate screening and preventive services  including:   Pneumococcal vaccine  Prevnar 13 Influenza vaccine Td vaccine Screening electrocardiogram Bone densitometry screening Colorectal cancer screening Diabetes screening Glaucoma screening Nutrition counseling  Advanced directives: requested   Subjective:  Sara Lamb is a 72 y.o. MWF female who presents for Medicare Annual Wellness Visit and 3 month follow up.  She has HTN, HLD, DMII with AA, NASH, Osteoporosis, Tobacco use, Vit D Defciency, COPD, chronic hyponatremia, anxiety and poor medication compliance.  She is very noncompliant with test, vaccines, and has an aversion to medication with "reactions" to the majority of medications.   She uses WD40 on her hip and states it helps the pain. She does use Gabapentin for her painful peripheral neuropathy, 800 mg at bedtime and 100 mg TID.  She is on cymbalta for pain and mood. Uses Hydroxyzine 25mg   tab TID for nerves/anxiety. 1-2 tabs at bedtime for sleep. She also has trazodone  She has cirrhosis due to NASH, has RUQ pain, following with GI Sara Lamb, EGD 01/2017, no varices.   She has COPD by imaging, denies dyspnea or secretions, continues to smoke, reports has reduced from 2  packs to 1 pack per day.  CT Lung Cancer screen 07/23/20 benign appearance- aortic atherosclerosis and emphysema. She refuses another CT lung cancer screening  BMI is Body mass index is 29.97 kg/m., she has not been working on diet and exercise. She states she is not normally hungry, will only eat 1-2 meals a day. 1 is normally a salad Wt Readings from Last 3 Encounters:  10/13/22 143 lb 6.4 oz (65 kg)  10/06/22 139 lb 6.4 oz (63.2 kg)  08/31/22 139 lb (63 kg)   She is followed by cardiology for chronic diastolic heart failure, last visit 10/06/22. She was started on Lasix which did improve edema. She could not tolerate spironolactone due to rash. She continues aspirin and lisinopril. Had cath 2019 showed Prox RCA to Mid RCA lesion is 60%  stenosed. FFR negative- following with Sara. Ellyn Lamb. Myoview negative for ischemia . Her blood pressure has been controlled at home running 140's/ 70's, today their BP is BP: (!) 148/78. Just smoked a cigarette before she came in for visit.  BP Readings from Last 3 Encounters:  10/13/22 (!) 148/78  10/06/22 (!) 172/72  08/31/22 138/70   She does not workout. She denies shortness of breath, dizziness.   She has aortic atherosclerosis per CT 12/2016 She is not on cholesterol medication. Her cholesterol is at goal. Refuses statin medication. The cholesterol last visit was:   Lab Results  Component Value Date   CHOL 199 07/02/2022   HDL 58 07/02/2022   LDLCALC 113 (H) 07/02/2022   TRIG 161 (H) 07/02/2022   CHOLHDL 3.4 07/02/2022   She has had diabetes for 20 years. She has not been working on diet and exercise for diabetes, and denies hyperglycemia, hypoglycemia , nausea, paresthesia of the feet, polydipsia, polyuria, visual disturbances, vomiting and weight loss. She is prescribed glimepiride 4 mg BID taken with food, lantus 23 units PM. Fasting range 60-120's , night blood sugars 120's Lab Results  Component Value Date   HGBA1C 7.8 (H) 07/02/2022   Last GFR: Lab Results  Component Value Date   EGFR 60 08/31/2022    Patient is on Vitamin D supplement, taking 1000 units three days a week, GI upset with higher doses.   Lab Results  Component Value Date   VD25OH  7 (L) 07/02/2022      Medication Review: Current Outpatient Medications on File Prior to Visit  Medication Sig Dispense Refill   acetaminophen (TYLENOL) 500 MG tablet Take 500 mg by mouth as directed. Take 2 tablets three times ad ay as needed     aspirin EC 81 MG tablet Take 81 mg by mouth daily.     furosemide (LASIX) 40 MG tablet Take 1 tablet (40 mg total) by mouth daily. 30 tablet 11   gabapentin (NEURONTIN) 800 MG tablet Take 1/2 to 1 tablet at Bedtime for Neuropathy & Sleep ( & continue 100 mg capsules 3 x /day ) 90  tablet 3   glimepiride (AMARYL) 4 MG tablet TAKE ONE TABLET BY MOUTH TWICE DAILY with a meal FOR DIABETES 180 tablet 0   insulin glargine (LANTUS SOLOSTAR) 100 UNIT/ML Solostar Pen Inject 23 Units into the skin at bedtime. 15 mL 1   Insulin Pen Needle (CAREFINE PEN NEEDLES) 32G X 4 MM MISC Use 1x a day 100 each 3   ipratropium-albuterol (DUONEB) 0.5-2.5 (3) MG/3ML SOLN Take 3 mLs by nebulization every 4 (four) hours as needed. Max:6 doses per day 540 mL 3   lisinopril (ZESTRIL) 5 MG tablet Take 1 tablet (5 mg total) by mouth daily. 90 tablet 3   nadolol (CORGARD) 20 MG tablet Take 1 tablet Daily for BP & Hereditary Familial Essential Tremor 90 tablet 3   ondansetron (ZOFRAN) 4 MG tablet Take 1 tablet (4 mg total) by mouth daily as needed for nausea or vomiting. 30 tablet 1   traZODone (DESYREL) 50 MG tablet Take 1/2 to 1 tablet at Bedtime if needed for Sleep 90 tablet 3   No current facility-administered medications on file prior to visit.    Allergies  Allergen Reactions   Actos [Pioglitazone] Other (See Comments)    Patient stated that it elevated her BGL   Glyburide Other (See Comments)    Patient stated that it elevated her BGL   Metformin And Related     "metformin caused mini-strokes" and ELEVATED BS and also GLYBURIDE, ONGLYZA, ACTOS, INVOKANA caused elevated BS   Onglyza [Saxagliptin] Other (See Comments)    Patient stated that it elevated her BGL   Rosuvastatin Diarrhea   Insulins Rash   Janumet [Sitagliptin-Metformin Hcl] Rash and Other (See Comments)    Shakes/ rash   Penicillins Rash and Other (See Comments)   Spironolactone Rash    Current Problems (verified) Patient Active Problem List   Diagnosis Date Noted   Rash and other nonspecific skin eruption 02/12/2021   CKD stage 2 due to type 2 diabetes mellitus ([place]) 02/11/2021   Overweight (BMI 25.0-29.9) 10/11/2020   Chest pain with low risk for cardiac etiology 06/24/2020   Type 2 diabetes mellitus with circulatory  disorder ([place]) 11/24/2018   Chronic hyponatremia 03/18/2018   Coronary artery disease, non-occlusive: RCA 60%, FFR Negative. 02/16/2018   Cognitive dysfunction 03/27/2017   Liver cirrhosis secondary to NASH ([place]) 11/23/2016   Osteoporosis 05/27/2016   Atherosclerosis of abdominal aorta ([place]) - per CT 12/2016 04/30/2016   COPD (chronic obstructive pulmonary disease) ([place]) 04/30/2015   Tobacco use disorder 04/08/2015   Poor compliance with medication 11/05/2014   Vitamin D deficiency    Anxiety    Essential hypertension    Hyperlipidemia associated with type 2 diabetes mellitus ([place])     Screening Tests Immunization History  Administered Date(s) Administered   Hep A / Hep B 01/07/2017, 02/08/2017, 07/09/2017   Td  06/26/2001   Tdap 04/30/2015   Health Maintenance  Topic Date Due   COVID-19 Vaccine (1) Never done   Pneumonia Vaccine 51+ Years old (1 of 2 - PCV) Never done   OPHTHALMOLOGY EXAM  Never done   INFLUENZA VACCINE  10/25/2022 (Originally 02/24/2022)   MAMMOGRAM  06/24/2023 (Originally 05/25/2018)   COLONOSCOPY (Pts 45-45yrs Insurance coverage will need to be confirmed)  06/24/2023 (Originally 01/12/2020)   HEMOGLOBIN A1C  01/01/2023   FOOT EXAM  06/24/2023   Diabetic kidney evaluation - Urine ACR  07/03/2023   Diabetic kidney evaluation - eGFR measurement  09/01/2023   Medicare Annual Wellness (AWV)  10/13/2023   DTaP/Tdap/Td (3 - Td or Tdap) 04/29/2025   DEXA SCAN  Completed   Hepatitis C Screening  Completed   HPV VACCINES  Aged Out   Lung Cancer Screening  Discontinued   Zoster Vaccines- Shingrix  Discontinued     MRI brain 2016 CT chest 10/2017 IMPRESSION: 1. Multiple scattered millimetric peripheral pulmonary nodules are unchanged and considered benign. 2. Aortic atherosclerosis (ICD10-170.0). Three-vessel coronary artery calcification. 3. Cirrhosis.  Prior vaccinations: DECLINES ALL VACCINES TD or Tdap: 2016   Names of Other Physician/Practitioners you  currently use: 1. [facility] here for primary care 2. eye doctor,  Sara. Milta Deiters Ward 07/06/22 is scheduled 3. Dentures dentist  Patient Care Team: Unk Pinto, MD as PCP - General (Internal Medicine) Leonie Man, MD as PCP - Cardiology (Cardiology) Kinsinger, Arta Bruce, MD as Consulting Physician (General Surgery) Philemon Kingdom, MD as Consulting Physician (Internal Medicine) Armbruster, Carlota Raspberry, MD as Consulting Physician (Gastroenterology) [name], [facility] (Inactive) as Pharmacist (Pharmacist)  SURGICAL HISTORY She  has a past surgical history that includes Vaginal hysterectomy (age 25); Bilateral salpingoophorectomy (1997); Knee arthroscopy (Right, 11/2014); Cholecystectomy (N/A, 09/30/2016); LEFT HEART CATH AND CORONARY ANGIOGRAPHY (N/A, 04/12/2018); INTRAVASCULAR PRESSURE WIRE/FFR STUDY (N/A, 04/12/2018); transthoracic echocardiogram (02/2018); NM MYOVIEW LTD (07/03/2020); and ORIF hip fracture (Right, 2021). FAMILY HISTORY Her family history includes Breast cancer (age of onset: 31) in her sister; CAD in her maternal aunt, maternal uncle, and sister; CAD (age of onset: 81) in her mother; Cancer in her father; Coronary artery disease (age of onset: 53) in her brother; Diabetes in her brother, father, and mother; Heart attack in her maternal grandmother; Heart attack (age of onset: 24) in her sister; Hyperlipidemia in her brother, sister, and sister; Hypertension in her brother, father, and sister; Kidney Stones in her son; Stomach cancer in her mother; Stroke in her mother.  SOCIAL HISTORY She  reports that she has been smoking cigarettes. She started smoking about 55 years ago. She has a 106.00 pack-year smoking history. She has never used smokeless tobacco. She reports that she does not drink alcohol and does not use drugs.   MEDICARE WELLNESS OBJECTIVES: Physical activity: Current Exercise Habits: The patient does not participate in  regular exercise at present, Exercise limited by: orthopedic condition(s);respiratory conditions(s);neurologic condition(s) Cardiac risk factors: Cardiac Risk Factors include: advanced age (>71men, >61 women);diabetes mellitus;dyslipidemia;hypertension;smoking/ tobacco exposure;sedentary lifestyle Depression/mood screen:      10/13/2022    2:48 PM  Depression screen PHQ 2/9  Decreased Interest 1  Down, Depressed, Hopeless 0  PHQ - 2 Score 1    ADLs:     10/13/2022    2:48 PM 06/23/2022    4:00 PM  In your present state of health, do you have any difficulty performing the following activities:  Hearing? 0 0  Vision? 0  0  Difficulty concentrating or making decisions? 0 0  Walking or climbing stairs? 1 1  Dressing or bathing? 1 1  Doing errands, shopping? 0 1     Cognitive Testing  Alert? Yes  Normal Appearance?Yes  Oriented to person? Yes  Place? Yes   Time? Yes  Recall of three objects?  Yes  Can perform simple calculations? Yes  Displays appropriate judgment?Yes  Can read the correct time from a watch face?Yes  EOL planning: Does Patient Have a Medical Advance Directive?: Yes Type of Advance Directive: Healthcare Power of Attorney, Living will Does patient want to make changes to medical advance directive?: No - Patient declined Copy of [place] in Chart?: No - copy requested  Review of Systems  Constitutional:  Negative for malaise/fatigue and weight loss.  HENT:  Negative for hearing loss and tinnitus.   Eyes:  Negative for blurred vision and double vision.  Respiratory:  Negative for cough, sputum production, shortness of breath and wheezing.   Cardiovascular:  Positive for leg swelling. Negative for chest pain, palpitations, orthopnea, claudication and PND.  Gastrointestinal:  Negative for abdominal pain, blood in stool, constipation, diarrhea, heartburn, melena, nausea and vomiting.  Genitourinary: Negative.   Musculoskeletal:  Positive for falls  and joint pain (right hip). Negative for myalgias.       Pain in left knee  Skin:  Negative for rash.  Neurological:  Negative for dizziness, tingling, sensory change, weakness and headaches.  Endo/Heme/Allergies:  Negative for polydipsia.  Psychiatric/Behavioral:  Negative for depression, memory loss, substance abuse and suicidal ideas. The patient is nervous/anxious. The patient does not have insomnia.   All other systems reviewed and are negative.    Objective:     Today's Vitals   10/13/22 1414  BP: (!) 148/78  Pulse: 84  Temp: 97.7 F (36.5 C)  SpO2: 97%  Weight: 143 lb 6.4 oz (65 kg)  Height: 4\' 10"  (1.473 m)   Body mass index is 29.97 kg/m.  General appearance: alert, no distress, WD/WN, female HEENT: normocephalic, sclerae anicteric, TMs pearly, nares patent, no discharge or erythema, pharynx normal Oral cavity: MMM, no lesions Neck: supple, no lymphadenopathy, no thyromegaly, no masses Heart: RRR, normal S1, S2, murmurs Lungs: CTA bilaterally, no wheezes, rhonchi, or rales Abdomen: +bs, soft, non tender, non distended, no masses, no hepatomegaly, no splenomegaly Musculoskeletal: no swelling, no obvious deformity Extremities: 2+ pitting edema of lower legs bilaterally.  Pulses: diminished in lower extremities due to swelling Neurological: alert, oriented x 3, CN2-12 intact, strength normal upper extremities.  left lower extremity and right lower extremity has decreased strength, sensation decreased in feet bilaterally to monofilament, Antalgic gait uses walker Psychiatric: normal affect, behavior normal, pleasant   Medicare Attestation I have personally reviewed: The patient's medical and social history Their use of alcohol, tobacco or illicit drugs Their current medications and supplements The patient's functional ability including ADLs,fall risks, home safety risks, cognitive, and hearing and visual impairment Diet and physical activities Evidence for depression  or mood disorders  The patient's weight, height, BMI, and visual acuity have been recorded in the chart.  I have made referrals, counseling, and provided education to the patient based on review of the above and I have provided the patient with a written personalized care plan for preventive services.     Sara Rossetti, Sara Lamb   10/13/2022

## 2022-10-13 ENCOUNTER — Other Ambulatory Visit: Payer: Self-pay | Admitting: Internal Medicine

## 2022-10-13 ENCOUNTER — Ambulatory Visit (INDEPENDENT_AMBULATORY_CARE_PROVIDER_SITE_OTHER): Payer: PPO | Admitting: Nurse Practitioner

## 2022-10-13 ENCOUNTER — Encounter: Payer: Self-pay | Admitting: Nurse Practitioner

## 2022-10-13 VITALS — BP 148/78 | HR 84 | Temp 97.7°F | Ht <= 58 in | Wt 143.4 lb

## 2022-10-13 DIAGNOSIS — Z79899 Other long term (current) drug therapy: Secondary | ICD-10-CM

## 2022-10-13 DIAGNOSIS — I700 Atherosclerosis of aorta: Secondary | ICD-10-CM

## 2022-10-13 DIAGNOSIS — R6889 Other general symptoms and signs: Secondary | ICD-10-CM | POA: Diagnosis not present

## 2022-10-13 DIAGNOSIS — E1122 Type 2 diabetes mellitus with diabetic chronic kidney disease: Secondary | ICD-10-CM | POA: Diagnosis not present

## 2022-10-13 DIAGNOSIS — G250 Essential tremor: Secondary | ICD-10-CM

## 2022-10-13 DIAGNOSIS — Z0000 Encounter for general adult medical examination without abnormal findings: Secondary | ICD-10-CM

## 2022-10-13 DIAGNOSIS — E1169 Type 2 diabetes mellitus with other specified complication: Secondary | ICD-10-CM | POA: Diagnosis not present

## 2022-10-13 DIAGNOSIS — M818 Other osteoporosis without current pathological fracture: Secondary | ICD-10-CM | POA: Diagnosis not present

## 2022-10-13 DIAGNOSIS — K7581 Nonalcoholic steatohepatitis (NASH): Secondary | ICD-10-CM | POA: Diagnosis not present

## 2022-10-13 DIAGNOSIS — E1159 Type 2 diabetes mellitus with other circulatory complications: Secondary | ICD-10-CM | POA: Diagnosis not present

## 2022-10-13 DIAGNOSIS — Z794 Long term (current) use of insulin: Secondary | ICD-10-CM

## 2022-10-13 DIAGNOSIS — E1151 Type 2 diabetes mellitus with diabetic peripheral angiopathy without gangrene: Secondary | ICD-10-CM

## 2022-10-13 DIAGNOSIS — E559 Vitamin D deficiency, unspecified: Secondary | ICD-10-CM

## 2022-10-13 DIAGNOSIS — E1140 Type 2 diabetes mellitus with diabetic neuropathy, unspecified: Secondary | ICD-10-CM | POA: Diagnosis not present

## 2022-10-13 DIAGNOSIS — E785 Hyperlipidemia, unspecified: Secondary | ICD-10-CM

## 2022-10-13 DIAGNOSIS — N182 Chronic kidney disease, stage 2 (mild): Secondary | ICD-10-CM | POA: Diagnosis not present

## 2022-10-13 DIAGNOSIS — Z91148 Patient's other noncompliance with medication regimen for other reason: Secondary | ICD-10-CM

## 2022-10-13 DIAGNOSIS — E871 Hypo-osmolality and hyponatremia: Secondary | ICD-10-CM

## 2022-10-13 DIAGNOSIS — R600 Localized edema: Secondary | ICD-10-CM

## 2022-10-13 DIAGNOSIS — L299 Pruritus, unspecified: Secondary | ICD-10-CM

## 2022-10-13 DIAGNOSIS — F17200 Nicotine dependence, unspecified, uncomplicated: Secondary | ICD-10-CM | POA: Diagnosis not present

## 2022-10-13 DIAGNOSIS — Z0001 Encounter for general adult medical examination with abnormal findings: Secondary | ICD-10-CM

## 2022-10-13 DIAGNOSIS — J449 Chronic obstructive pulmonary disease, unspecified: Secondary | ICD-10-CM | POA: Diagnosis not present

## 2022-10-13 DIAGNOSIS — I10 Essential (primary) hypertension: Secondary | ICD-10-CM

## 2022-10-13 DIAGNOSIS — I5032 Chronic diastolic (congestive) heart failure: Secondary | ICD-10-CM

## 2022-10-13 DIAGNOSIS — F419 Anxiety disorder, unspecified: Secondary | ICD-10-CM

## 2022-10-13 DIAGNOSIS — K7460 Unspecified cirrhosis of liver: Secondary | ICD-10-CM

## 2022-10-13 MED ORDER — DIVALPROEX SODIUM 250 MG PO DR TAB: DELAYED_RELEASE_TABLET | ORAL | 1 refills | Status: DC

## 2022-10-13 MED ORDER — GABAPENTIN 100 MG PO CAPS: 100.0000 mg | ORAL_CAPSULE | Freq: Three times a day (TID) | ORAL | 0 refills | Status: DC

## 2022-10-13 MED ORDER — HYDROXYZINE HCL 25 MG PO TABS: ORAL_TABLET | ORAL | 0 refills | Status: DC

## 2022-10-13 MED ORDER — DULOXETINE HCL 20 MG PO CPEP: ORAL_CAPSULE | ORAL | 3 refills | Status: DC

## 2022-10-13 NOTE — Patient Instructions (Signed)

## 2022-10-14 LAB — TSH: TSH: 1.88 mIU/L (ref 0.40–4.50)

## 2022-10-14 LAB — CBC WITH DIFFERENTIAL/PLATELET
Absolute Monocytes: 881 cells/uL (ref 200–950)
Basophils Absolute: 89 cells/uL (ref 0–200)
Basophils Relative: 0.9 %
Eosinophils Absolute: 653 cells/uL — ABNORMAL HIGH (ref 15–500)
Eosinophils Relative: 6.6 %
HCT: 38.5 % (ref 35.0–45.0)
Hemoglobin: 12.7 g/dL (ref 11.7–15.5)
Lymphs Abs: 1495 cells/uL (ref 850–3900)
MCH: 29.5 pg (ref 27.0–33.0)
MCHC: 33 g/dL (ref 32.0–36.0)
MCV: 89.5 fL (ref 80.0–100.0)
MPV: 10.1 fL (ref 7.5–12.5)
Monocytes Relative: 8.9 %
Neutro Abs: 6782 cells/uL (ref 1500–7800)
Neutrophils Relative %: 68.5 %
Platelets: 231 10*3/uL (ref 140–400)
RBC: 4.3 10*6/uL (ref 3.80–5.10)
RDW: 12.8 % (ref 11.0–15.0)
Total Lymphocyte: 15.1 %
WBC: 9.9 10*3/uL (ref 3.8–10.8)

## 2022-10-14 LAB — COMPLETE METABOLIC PANEL WITH GFR
AG Ratio: 1 (calc) (ref 1.0–2.5)
ALT: 14 U/L (ref 6–29)
AST: 19 U/L (ref 10–35)
Albumin: 2.8 g/dL — ABNORMAL LOW (ref 3.6–5.1)
Alkaline phosphatase (APISO): 107 U/L (ref 37–153)
BUN: 16 mg/dL (ref 7–25)
CO2: 29 mmol/L (ref 20–32)
Calcium: 8.5 mg/dL — ABNORMAL LOW (ref 8.6–10.4)
Chloride: 99 mmol/L (ref 98–110)
Creat: 0.77 mg/dL (ref 0.60–1.00)
Globulin: 2.9 g/dL (calc) (ref 1.9–3.7)
Glucose, Bld: 82 mg/dL (ref 65–99)
Potassium: 4.6 mmol/L (ref 3.5–5.3)
Sodium: 137 mmol/L (ref 135–146)
Total Bilirubin: 0.2 mg/dL (ref 0.2–1.2)
Total Protein: 5.7 g/dL — ABNORMAL LOW (ref 6.1–8.1)
eGFR: 82 mL/min/{1.73_m2} (ref >=60)

## 2022-10-14 LAB — LIPID PANEL
Cholesterol: 235 mg/dL — ABNORMAL HIGH (ref <200)
HDL: 69 mg/dL (ref >=50)
LDL Cholesterol (Calc): 139 mg/dL (calc) — ABNORMAL HIGH
Non-HDL Cholesterol (Calc): 166 mg/dL (calc) — ABNORMAL HIGH (ref <130)
Total CHOL/HDL Ratio: 3.4 (calc) (ref <5.0)
Triglycerides: 142 mg/dL (ref <150)

## 2022-10-14 LAB — HEMOGLOBIN A1C
Hgb A1c MFr Bld: 7.6 % of total Hgb — ABNORMAL HIGH (ref <5.7)
Mean Plasma Glucose: 171 mg/dL
eAG (mmol/L): 9.5 mmol/L

## 2022-10-14 LAB — MAGNESIUM: Magnesium: 1.8 mg/dL (ref 1.5–2.5)

## 2022-11-19 ENCOUNTER — Ambulatory Visit (INDEPENDENT_AMBULATORY_CARE_PROVIDER_SITE_OTHER): Payer: PPO | Admitting: Nurse Practitioner

## 2022-11-19 ENCOUNTER — Encounter: Payer: Self-pay | Admitting: Nurse Practitioner

## 2022-11-19 VITALS — BP 142/88 | HR 96 | Temp 97.7°F | Ht <= 58 in | Wt 138.4 lb

## 2022-11-19 DIAGNOSIS — E1151 Type 2 diabetes mellitus with diabetic peripheral angiopathy without gangrene: Secondary | ICD-10-CM

## 2022-11-19 DIAGNOSIS — J438 Other emphysema: Secondary | ICD-10-CM

## 2022-11-19 DIAGNOSIS — Z79899 Other long term (current) drug therapy: Secondary | ICD-10-CM

## 2022-11-19 DIAGNOSIS — R062 Wheezing: Secondary | ICD-10-CM | POA: Diagnosis not present

## 2022-11-19 DIAGNOSIS — J069 Acute upper respiratory infection, unspecified: Secondary | ICD-10-CM

## 2022-11-19 DIAGNOSIS — F17200 Nicotine dependence, unspecified, uncomplicated: Secondary | ICD-10-CM | POA: Diagnosis not present

## 2022-11-19 DIAGNOSIS — R053 Chronic cough: Secondary | ICD-10-CM

## 2022-11-19 DIAGNOSIS — I700 Atherosclerosis of aorta: Secondary | ICD-10-CM | POA: Diagnosis not present

## 2022-11-19 MED ORDER — GLIMEPIRIDE 4 MG PO TABS: ORAL_TABLET | ORAL | 0 refills | Status: DC

## 2022-11-19 MED ORDER — AZITHROMYCIN 250 MG PO TABS: ORAL_TABLET | ORAL | 1 refills | Status: DC

## 2022-11-19 MED ORDER — PROMETHAZINE-DM 6.25-15 MG/5ML PO SYRP
5.0000 mL | ORAL_SOLUTION | Freq: Four times a day (QID) | ORAL | 0 refills | Status: DC | PRN
Start: 2022-11-19 — End: 2023-01-27

## 2022-11-19 MED ORDER — LANTUS SOLOSTAR 100 UNIT/ML ~~LOC~~ SOPN: 23.0000 [IU] | PEN_INJECTOR | Freq: Every day | SUBCUTANEOUS | 1 refills | Status: DC

## 2022-11-19 NOTE — Patient Instructions (Signed)

## 2022-11-19 NOTE — Progress Notes (Signed)
Assessment and Plan:  Sara Lamb was seen today for an episodic visit..  Diagnoses and all order for this visit:  Upper respiratory tract infection, unspecified type Stay well hydrated to keep mucus thin and productive. Continue Ipratropium Albuterol nebulizer Start Z-Pak   - azithromycin (ZITHROMAX) 250 MG tablet; Take 2 tablets on  Day 1,  followed by 1 tablet  daily for 4 more days    for Sinusitis  /Bronchitis  Dispense: 6 each; Refill: 1  Other emphysema/Tobacco use Discussed smoking cessation. Patient not ready to quit Smoking cessation instruction/counseling given:  counseled patient on the dangers of tobacco use, advised patient to stop smoking, and reviewed strategies to maximize success  Wheezing Continue nebulizer tmt Report to ER or call 911 for any increase in difficulty breathing.  Chronic cough Start Promethazine cough syrup as directed. Stay well hydrated.  - promethazine-dextromethorphan (PROMETHAZINE-DM) 6.25-15 MG/5ML syrup; Take 5 mLs by mouth 4 (four) times daily as needed for cough.  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  Dispense: 15 mL; Refill: 1  Medication management All medications discussed and reviewed in full. All questions and concerns regarding medications addressed.    - glimepiride (AMARYL) 4 MG tablet; TAKE ONE TABLET BY MOUTH TWICE DAILY with a meal FOR  DIABETES  Dispense: 180 tablet; Refill: 0   Notify office for further evaluation and treatment, questions or concerns if s/s fail to improve. The risks and benefits of my recommendations, as well as other treatment options were discussed with the patient today. Questions were answered.  Further disposition pending results of labs. Discussed med's effects and SE's.    Over 15 minutes of exam, counseling, chart review, and critical decision making was performed.   Future Appointments  Date Time Provider Department Center  12/09/2022  1:30 PM Joylene Grapes, NP [facility] None  01/22/2023 10:30 AM Raynelle Dick, NP [facility] None  07/06/2023  2:00 PM Raynelle Dick, NP [facility] None    ------------------------------------------------------------------------------------------------------------------   HPI BP (!) 142/88   Pulse 96   Temp 97.7 F (36.5 C)   Ht 4\' 10"  (1.473 m)   Wt 138 lb 6.4 oz (62.8 kg)   SpO2 97%   BMI 28.93 kg/m    Patient complains of symptoms of a URI, possible sinusitis. Symptoms include achiness, congestion, cough described as productive, nasal congestion, sneezing, sore throat, and wheezing. Onset of symptoms was 2 weeks ago, after attending nieces birthday party and has been unchanged since that time. Treatment to date: cough suppressants.  She is a current every day smoker.  She has a hx of COPD.  Has been using in home nebulizer tmt with some effectiveness.  Denies fever, chills, increase in SOB.  She has a hx of DM2.  Currently treated with Glimepiride and Insulin Lantus 23 U.  Feels BG is well controlled.  Deferring tmt with steroids d/t causing increase in BG.  She is requesting refills for medications at this  time.   Lab Results  Component Value Date   HGBA1C 7.6 (H) 10/13/2022     Past Medical History:  Diagnosis Date   Anxiety    Anxiety disorder    Cirrhosis    COPD (chronic obstructive pulmonary disease)    followed by pcp--   last exacerbation 11/ 2017   Coronary artery disease, non-occlusive 03/2018   Cardiac Cath: Moderate (60%) proximal-mid RCA (FFR 0.89 => NEGATIVE / not physiologically significant).  Normal EF and EDP.   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--  "gets real sharp stabbing pain over eye and speech screws up"  pt states takes excederin migraine and lies down (pt stated was told if she "if come to ER again they would send her to psych ward")   Hyperlipidemia    Hypertension    OA (osteoarthritis)    back, hips, knees, feet   Osteoporosis    Polyp of colon, hyperplastic    Pulmonary nodules    per CT chest 08-28-2016  -- bilateral upper lobe nodules   S/P right hip fracture 09/06/2019   Stroke    Tubular adenoma of colon    Type 2 diabetes mellitus    followed by pcp--  last A1c 9.1 on 08-01-2016   UTI (urinary tract infection)    Wears dentures      Allergies  Allergen Reactions   Actos [Pioglitazone] Other (See Comments)    Patient stated that it elevated her BGL   Glyburide Other (See Comments)    Patient stated that it elevated her BGL   Metformin And Related     "metformin caused mini-strokes" and ELEVATED BS and also GLYBURIDE, ONGLYZA, ACTOS, INVOKANA caused elevated BS   Onglyza [Saxagliptin] Other (See Comments)    Patient stated that it elevated her BGL   Rosuvastatin Diarrhea   Insulins Rash   Janumet [Sitagliptin-Metformin Hcl] Rash and Other (See Comments)    Shakes/ rash   Penicillins Rash and Other (See Comments)   Spironolactone Rash    Current Outpatient Medications on File Prior to Visit  Medication Sig   acetaminophen (TYLENOL) 500 MG tablet Take 500 mg by mouth as directed. Take 2 tablets three times ad ay as needed   aspirin EC 81 MG tablet Take 81 mg by mouth daily.   divalproex (DEPAKOTE) 250 MG DR tablet Take 1 tablet 3 x /day for Hereditary tremor or "Shaking Palsy"    DULoxetine (CYMBALTA) 20 MG capsule Take  1 capsule  Daily  for Chronic Pain   furosemide (LASIX) 40 MG tablet Take 1 tablet (40 mg total) by mouth daily.   gabapentin (NEURONTIN) 100 MG capsule Take 1 capsule (100 mg total) by mouth 3 (three) times daily.   gabapentin (NEURONTIN) 800 MG tablet Take 1/2 to 1 tablet at Bedtime for Neuropathy & Sleep ( & continue 100 mg capsules 3 x /day )   hydrOXYzine (ATARAX) 25 MG tablet Take 1 tablet by mouth 3 times daily for Nerves /Anxiety or 1 to 2 tablets at Bedtime as needed for Sleep   Insulin Pen Needle (CAREFINE PEN NEEDLES) 32G X 4 MM MISC Use 1x a day   ipratropium-albuterol (DUONEB) 0.5-2.5 (3) MG/3ML SOLN Take 3 mLs by nebulization every 4 (four) hours as needed. Max:6 doses per day   lisinopril (ZESTRIL) 5 MG tablet Take 1 tablet (  5 mg total) by mouth daily.   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   traZODone (DESYREL) 50 MG tablet Take 1/2 to 1 tablet at Bedtime if needed for Sleep   No current facility-administered medications on file prior to visit.    ROS: all negative except what is noted in the HPI.   Physical Exam:  BP (!) 142/88   Pulse 96   Temp 97.7 F (36.5 C)   Ht  (1.473 m)   Wt 138 lb 6.4 oz (62.8 kg)   SpO2 97%   BMI 28.93 kg/m   General Appearance: NAD.  Awake, conversant and cooperative. Eyes: PERRLA, EOMs intact.  Sclera white.  Conjunctiva without erythema. Sinuses: No frontal/maxillary tenderness.  No nasal discharge. Nares patent.  ENT/Mouth: Ext aud canals clear.  Bilateral TMs w/DOL and without erythema or bulging. Hearing intact.  Posterior pharynx without swelling or exudate.  Tonsils without swelling or erythema.  Neck: Supple.  No masses, nodules or thyromegaly. Respiratory: Effort is regular with non-labored breathing. Breath sounds  are equal bilaterally with rales and wheezing scattered throughout anterior and posterior lung fields upon inspiration and expiration. Cardio: RRR with no MRGs. Brisk peripheral pulses without edema.  Abdomen: Active BS in all four quadrants.  Soft and non-tender without guarding, rebound tenderness, hernias or masses. Lymphatics: Non tender without lymphadenopathy.  Musculoskeletal: Full ROM, 5/5 strength, normal ambulation.  No clubbing or cyanosis. Skin: Appropriate color for ethnicity. Warm without rashes, lesions, ecchymosis, ulcers.  Neuro: CN II-XII grossly normal. Normal muscle tone without cerebellar symptoms and intact sensation.   Psych: AO X 3,  appropriate mood and affect, insight and judgment.     Adela Glimpse, NP 5:04 PM [facility]

## 2022-12-09 ENCOUNTER — Ambulatory Visit: Payer: PPO | Admitting: Nurse Practitioner

## 2022-12-10 ENCOUNTER — Other Ambulatory Visit: Payer: Self-pay | Admitting: Nurse Practitioner

## 2022-12-10 DIAGNOSIS — G4701 Insomnia due to medical condition: Secondary | ICD-10-CM

## 2022-12-10 MED ORDER — TRAZODONE HCL 100 MG PO TABS
ORAL_TABLET | ORAL | 2 refills | Status: DC
Start: 2022-12-10 — End: 2023-06-08

## 2022-12-16 ENCOUNTER — Other Ambulatory Visit: Payer: Self-pay | Admitting: Nurse Practitioner

## 2022-12-16 DIAGNOSIS — L299 Pruritus, unspecified: Secondary | ICD-10-CM

## 2022-12-22 ENCOUNTER — Ambulatory Visit: Payer: PPO | Admitting: Nurse Practitioner

## 2023-01-22 ENCOUNTER — Ambulatory Visit: Payer: PPO | Admitting: Nurse Practitioner

## 2023-01-22 ENCOUNTER — Encounter: Payer: Self-pay | Admitting: Nurse Practitioner

## 2023-01-22 ENCOUNTER — Ambulatory Visit: Payer: PPO | Attending: Nurse Practitioner | Admitting: Nurse Practitioner

## 2023-01-22 VITALS — BP 144/74 | HR 74 | Ht <= 58 in | Wt 136.6 lb

## 2023-01-22 DIAGNOSIS — E785 Hyperlipidemia, unspecified: Secondary | ICD-10-CM

## 2023-01-22 DIAGNOSIS — R0600 Dyspnea, unspecified: Secondary | ICD-10-CM | POA: Diagnosis not present

## 2023-01-22 DIAGNOSIS — I5032 Chronic diastolic (congestive) heart failure: Secondary | ICD-10-CM | POA: Diagnosis not present

## 2023-01-22 DIAGNOSIS — I2510 Atherosclerotic heart disease of native coronary artery without angina pectoris: Secondary | ICD-10-CM | POA: Diagnosis not present

## 2023-01-22 DIAGNOSIS — E1159 Type 2 diabetes mellitus with other circulatory complications: Secondary | ICD-10-CM

## 2023-01-22 DIAGNOSIS — I340 Nonrheumatic mitral (valve) insufficiency: Secondary | ICD-10-CM

## 2023-01-22 DIAGNOSIS — I10 Essential (primary) hypertension: Secondary | ICD-10-CM

## 2023-01-22 DIAGNOSIS — Z794 Long term (current) use of insulin: Secondary | ICD-10-CM

## 2023-01-22 DIAGNOSIS — Z720 Tobacco use: Secondary | ICD-10-CM | POA: Diagnosis not present

## 2023-01-22 MED ORDER — FUROSEMIDE 40 MG PO TABS
ORAL_TABLET | ORAL | 3 refills | Status: AC
Start: 2023-01-22 — End: ?

## 2023-01-22 NOTE — Progress Notes (Signed)
Office Visit    Patient Name: Sara Lamb Date of Encounter: 01/22/2023  Primary Care Provider:  Lucky Cowboy, MD Primary Cardiologist:  Bryan Lemma, MD  Chief Complaint    72 year old female with a history of moderate nonobstructive CAD, chronic diastolic heart failure, mitral valve regurgitation, hypertension, hyperlipidemia, type 2 diabetes, and former tobacco use who presents for follow-up related to CAD and heart failure.   Past Medical History    Past Medical History:  Diagnosis Date   Anxiety    Anxiety disorder    Cirrhosis (HCC)    COPD (chronic obstructive pulmonary disease) (HCC)    followed by pcp--  last exacerbation 11/ 2017   Coronary artery disease, non-occlusive 03/2018   Cardiac Cath: Moderate (60%) proximal-mid RCA (FFR 0.89 => NEGATIVE / not physiologically significant).  Normal EF and EDP.   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--  "gets real sharp stabbing pain over eye and speech screws up"  pt states takes excederin migraine and lies down (pt stated was told if she "if come to ER again they would send her to psych ward")   Hyperlipidemia    Hypertension    OA (osteoarthritis)    back, hips, knees, feet   Osteoporosis    Polyp of colon, hyperplastic    Pulmonary nodules    per CT chest 08-28-2016  -- bilateral upper lobe nodules   S/P right hip fracture 09/06/2019   Stroke (HCC)    Tubular adenoma of colon    Type 2 diabetes mellitus (HCC)    followed by pcp--  last A1c 9.1 on 08-01-2016   UTI (urinary tract infection)    Wears dentures    Past Surgical History:  Procedure Laterality Date   BILATERAL SALPINGOOPHORECTOMY  1997   via Laparoscopy   CHOLECYSTECTOMY N/A 09/30/2016   Procedure: LAPAROSCOPIC CHOLECYSTECTOMY;  Surgeon: Rodman Pickle, MD;  Location: [facility];  Service: General;  Laterality: N/A;   CORONARY PRESSURE/FFR  STUDY N/A 04/12/2018   Procedure: INTRAVASCULAR PRESSURE WIRE/FFR STUDY;  Surgeon: Marykay Lex, MD;  Location: [facility] INVASIVE CV LAB;  Service: Cardiovascular;  Laterality: N/A;   KNEE ARTHROSCOPY Right 11/2014   LEFT HEART CATH AND CORONARY ANGIOGRAPHY N/A 04/12/2018   Procedure: LEFT HEART CATH AND CORONARY ANGIOGRAPHY;  Surgeon: Marykay Lex, MD;  Location: [facility] INVASIVE CV LAB;  Service: Cardiovascular;  Laterality: N/A;   NM MYOVIEW LTD  07/03/2020    EF>65%.  No EKG changes.  LOW RISK.  No ischemia or infarction.   ORIF HIP FRACTURE Right 2021   [facility], Dr. Lucius Conn   TRANSTHORACIC ECHOCARDIOGRAM  02/2018    Normal LV function.  EF 60 to 65%.  No RWMMA.  GRII DD.  Moderate LA dilation.   VAGINAL HYSTERECTOMY  age 54    Allergies  Allergies  Allergen Reactions   Actos [Pioglitazone] Other (See Comments)    Patient stated that it elevated her BGL   Glyburide Other (See Comments)    Patient stated that it elevated her BGL   Metformin And Related     "metformin caused mini-strokes" and ELEVATED BS and also GLYBURIDE, ONGLYZA, ACTOS, INVOKANA caused elevated BS   Onglyza [Saxagliptin] Other (See Comments)    Patient stated that it elevated her BGL   Rosuvastatin Diarrhea   Insulins Rash  Janumet [Sitagliptin-Metformin Hcl] Rash and Other (See Comments)    Shakes/ rash   Penicillins Rash and Other (See Comments)   Spironolactone Rash     Labs/Other Studies Reviewed    The following studies were reviewed today:  Cardiac Studies & Procedures   CARDIAC CATHETERIZATION  CARDIAC CATHETERIZATION 04/12/2018  Narrative Images from the original result were not included.   Prox RCA to Mid RCA lesion is 60% stenosed. FFR negative (0 29).  The left ventricular systolic function is normal. The left ventricular ejection fraction is greater than 65% by visual estimate.  LV end diastolic pressure is normal.  There is no mitral valve regurgitation with notable mitral  annular calcification  Moderate single-vessel disease with FFR negative lesion proximal to mid RCA.  Plan: Discharge home after bedrest and TR band removal  Will likely titrate medications -continue Imdur.  Aggressive risk factor modification  Smoking cessation counseling  Recommend Aspirin 81mg  daily for moderate CAD.    Bryan Lemma, M.D., M.S. Interventional Cardiologist  Pager # [phone] Phone # [phone] [address] [place], [place] [address]  Findings Coronary Findings Diagnostic  Dominance: Right  Left Anterior Descending The vessel exhibits minimal luminal irregularities.  Lateral First Diagonal Branch Vessel is small in size.  First Septal Branch Vessel is small in size.  Second Diagonal Branch Vessel is small in size.  Second Septal Branch Vessel is small in size.  Third Diagonal Branch Vessel is small in size.  Left Circumflex Vessel is normal in caliber. The vessel exhibits minimal luminal irregularities. Ost Cx lesion is 30% stenosed. The lesion is focal and smooth. Prox Cx to Mid Cx lesion is 20% stenosed.  First Obtuse Marginal Branch Vessel is small in size.  Left Posterior Atrioventricular Artery Vessel is small in size.  Right Coronary Artery Prox RCA to Mid RCA lesion is 60% stenosed. The lesion is tubular, concentric and smooth. Pressure wire/FFR was performed on the lesion. FFR: 0.89. 6 [place] JR4 guide catheter: After additional 2000 IV heparin was administered to achieve an ACT &gt; 250 Sec, a Comet FFR wire used; adenosine infused standard rate 140 mg/KG/min for a total of 2 minutes. --&gt;  Baseline FFR 0.98.  Final FFR 0.89.  Acute Marginal Branch Vessel is moderate in size.  Right Posterior Descending Artery Vessel is moderate in size. Vessel is angiographically normal.  Right Posterior Atrioventricular Artery Vessel is angiographically normal.  Second Right Posterolateral Branch Vessel is small in  size.  Intervention  No interventions have been documented.   STRESS TESTS  MYOCARDIAL PERFUSION IMAGING 07/03/2020  Narrative  The left ventricular ejection fraction is hyperdynamic (>65%).  Nuclear stress EF: 80%.  There was no ST segment deviation noted during stress.  The study is normal.  This is a low risk study.   ECHOCARDIOGRAM  ECHOCARDIOGRAM COMPLETE 07/15/2022  Narrative ECHOCARDIOGRAM REPORT    Patient Name:   Sara Lamb Date of Exam: 07/15/2022 Medical Rec #:  [id number]       Height:       58.0 in Accession #:    [id number]      Weight:       145.0 lb Date of Birth:  [birth year]       BSA:          1.588 m Patient Age:    71 years        BP:           156/76 mmHg Patient Gender: F  HR:           68 bpm. Exam Location:  [address]  Procedure: 2D Echo, Cardiac Doppler, Color Doppler, Strain Analysis and 3D Echo  Indications:    Edema R60.9  History:        Patient has prior history of Echocardiogram examinations, most recent 03/03/2018. COPD; Risk Factors:Hypertension, Diabetes and Dyslipidemia.  Sonographer:    Thurman Coyer RDCS Referring Phys: [id number] George SWINYER  IMPRESSIONS   1. Left ventricular ejection fraction, by estimation, is 60 to 65%. Left ventricular ejection fraction by 3D volume is 61 %. The left ventricle has normal function. The left ventricle has no regional wall motion abnormalities. Left ventricular diastolic parameters are consistent with Grade II diastolic dysfunction (pseudonormalization). Elevated left atrial pressure. The average left ventricular global longitudinal strain is -18.2 %. The global longitudinal strain is normal. 2. Right ventricular systolic function is mildly reduced. The right ventricular size is normal. There is moderately elevated pulmonary artery systolic pressure. The estimated right ventricular systolic pressure is 50.5 mmHg. 3. Left atrial size was mildly dilated. 4. The mitral  valve is rheumatic. Moderate to severe mitral valve regurgitation. Mild mitral stenosis. The mean mitral valve gradient is 7.0 mmHg with average heart rate of 66 bpm. Moderate mitral annular calcification. 5. Tricuspid valve regurgitation is mild to moderate. 6. The aortic valve is tricuspid. Aortic valve regurgitation is not visualized. No aortic stenosis is present. 7. The inferior vena cava is dilated in size with >50% respiratory variability, suggesting right atrial pressure of 8 mmHg.  Comparison(s): Prior images reviewed side by side. Changes from prior study are noted. Mitral insufficiency has worsened since 2019. This probably explains the increased gradients across the mitral valve, which is only mildly stenotic.  FINDINGS Left Ventricle: Left ventricular ejection fraction, by estimation, is 60 to 65%. Left ventricular ejection fraction by 3D volume is 61 %. The left ventricle has normal function. The left ventricle has no regional wall motion abnormalities. The average left ventricular global longitudinal strain is -18.2 %. The global longitudinal strain is normal. The left ventricular internal cavity size was normal in size. There is borderline concentric left ventricular hypertrophy. Left ventricular diastolic parameters are consistent with Grade II diastolic dysfunction (pseudonormalization). Elevated left atrial pressure.  Right Ventricle: The right ventricular size is normal. No increase in right ventricular wall thickness. Right ventricular systolic function is mildly reduced. There is moderately elevated pulmonary artery systolic pressure. The tricuspid regurgitant velocity is 3.26 m/s, and with an assumed right atrial pressure of 8 mmHg, the estimated right ventricular systolic pressure is 50.5 mmHg.  Left Atrium: Left atrial size was mildly dilated.  Right Atrium: Right atrial size was normal in size.  Pericardium: There is no evidence of pericardial effusion.  Mitral Valve:  The mitral valve is rheumatic. There is moderate thickening of the mitral valve leaflet(s). There is mild calcification of the mitral valve leaflet(s). Moderate mitral annular calcification. Moderate to severe mitral valve regurgitation, with anteriorly-directed jet. Mild mitral valve stenosis. MV peak gradient, 20.8 mmHg. The mean mitral valve gradient is 7.0 mmHg with average heart rate of 66 bpm.  Tricuspid Valve: The tricuspid valve is normal in structure. Tricuspid valve regurgitation is mild to moderate.  Aortic Valve: The aortic valve is tricuspid. Aortic valve regurgitation is not visualized. No aortic stenosis is present.  Pulmonic Valve: The pulmonic valve was grossly normal. Pulmonic valve regurgitation is trivial.  Aorta: The aortic root and ascending aorta are structurally normal,  with no evidence of dilitation.  Venous: The inferior vena cava is dilated in size with greater than 50% respiratory variability, suggesting right atrial pressure of 8 mmHg.  IAS/Shunts: No atrial level shunt detected by color flow Doppler.   LEFT VENTRICLE PLAX 2D LVIDd:         3.90 cm         Diastology LVIDs:         2.20 cm         LV e' medial:    4.95 cm/s LV PW:         1.10 cm         LV E/e' medial:  46.5 LV IVS:        1.10 cm         LV e' lateral:   4.93 cm/s LVOT diam:     1.95 cm         LV E/e' lateral: 46.7 LV SV:         75 LV SV Index:   47              2D LVOT Area:     2.99 cm        Longitudinal Strain 2D Strain GLS  -18.2 % Avg:  3D Volume EF LV 3D EF:    Left ventricul ar ejection fraction by 3D volume is 61 %.  3D Volume EF: 3D EF:        61 % LV EDV:       74 ml LV ESV:       29 ml LV SV:        45 ml  RIGHT VENTRICLE RV Basal diam:  3.40 cm RV Mid diam:    2.80 cm RV S prime:     7.31 cm/s TAPSE (M-mode): 1.4 cm  LEFT ATRIUM             Index        RIGHT ATRIUM           Index LA diam:        4.00 cm 2.52 cm/m   RA Area:     13.20 cm LA Vol  (A2C):   78.6 ml 49.48 ml/m  RA Volume:   27.50 ml  17.31 ml/m LA Vol (A4C):   57.9 ml 36.45 ml/m LA Biplane Vol: 68.0 ml 42.81 ml/m AORTIC VALVE LVOT Vmax:   102.00 cm/s LVOT Vmean:  72.150 cm/s LVOT VTI:    0.252 m  AORTA Ao Root diam: 2.80 cm Ao Asc diam:  2.80 cm  MITRAL VALVE                  TRICUSPID VALVE MV Area (PHT): 3.86 cm       TR Peak grad:   42.5 mmHg MV Area VTI:   1.39 cm       TR Vmax:        326.00 cm/s MV Peak grad:  20.8 mmHg MV Mean grad:  7.0 mmHg       SHUNTS MV Vmax:       2.28 m/s       Systemic VTI:  0.25 m MV Vmean:      123.0 cm/s     Systemic Diam: 1.95 cm MV Decel Time: 197 msec MR Peak grad:    96.0 mmHg MR Mean grad:    62.5 mmHg MR Vmax:         490.00 cm/s MR Vmean:  376.5 cm/s MR PISA:         3.08 cm MR PISA Eff ROA: 25 mm MR PISA Radius:  0.70 cm MV E velocity: 230.00 cm/s MV A velocity: 146.00 cm/s MV E/A ratio:  1.58  Mihai Croitoru MD Electronically signed by Thurmon Fair MD Signature Date/Time: 07/15/2022/4:05:38 PM    Final            Recent Labs: 10/13/2022: ALT 14; BUN 16; Creat 0.77; Hemoglobin 12.7; Magnesium 1.8; Platelets 231; Potassium 4.6; Sodium 137; TSH 1.88  Recent Lipid Panel    Component Value Date/Time   CHOL 235 (H) 10/13/2022 1504   TRIG 142 10/13/2022 1504   HDL 69 10/13/2022 1504   CHOLHDL 3.4 10/13/2022 1504   VLDL 39 (H) 11/23/2016 1300   LDLCALC 139 (H) 10/13/2022 1504    History of Present Illness    72 year old female with the above past medical history including moderate nonobstructive CAD, chronic diastolic heart failure, mitral valve regurgitation, hypertension, hyperlipidemia, type 2 diabetes, and former tobacco use.   She was initially referred to cardiology by her PCP for the evaluation of chest pain. Cardiac catheterization in 03/2018 revealed proximal RCA to mid RCA lesion with 60% stenosis, negative FFR, LV EF 65% by visual estimate, normal LVEDP.  Medical management was  advised.  At her follow-up visit in 06/2022 she noted a 1 to 8-month history of lower extremity edema, weight gain.  She was started on furosemide and spironolactone.  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  She declined further testing/surgery for her mitral valve regurgitation.  Unfortunately,  she did not tolerate spironolactone due to rash. Therefore, lisinopril was increased to 5 mg daily.  She was last seen in the office on 10/06/2022 and was stable from cardiac standpoint.  BP was somewhat elevated.  She was advised to keep a BP log and notify our office of ongoing elevated BP.   She presents today for follow-up accompanied by her husband.  Since her last visit she has been stable overall from a cardiac standpoint.  She does note that she fell at home the other day when getting up from toilet.  She was sitting on the toilet for over an hour and her legs felt weak.  She denies presyncope, syncope.  She hit her right arm and has had some swelling to this arm since  She denies any other injuries.  She has had some increased bilateral lower extremity edema, she denies chest pain, dyspnea, PND, orthopnea, weight gain.   Home Medications    Current Outpatient Medications  Medication Sig Dispense Refill   acetaminophen (TYLENOL) 500 MG tablet Take 500 mg by mouth as directed. Take 2 tablets three times ad ay as needed     aspirin EC 81 MG tablet Take 81 mg by mouth daily.     divalproex (DEPAKOTE) 250 MG DR tablet Take 1 tablet 3 x /day for Hereditary tremor or "Shaking Palsy" 270 tablet 1   DULoxetine (CYMBALTA) 20 MG capsule Take  1 capsule  Daily  for Chronic Pain 90 capsule 3   gabapentin (NEURONTIN) 100 MG capsule Take 1 capsule (100 mg total)  by mouth 3 (three) times daily. 270 capsule 0   glimepiride (AMARYL) 4 MG tablet TAKE ONE TABLET BY MOUTH  TWICE DAILY with a meal FOR DIABETES 180 tablet 0   hydrOXYzine (ATARAX) 25 MG tablet Take 1 tablet by mouth 3 times daily for Nerves /Anxiety or 1 to 2 tablets at Bedtime as needed for Sleep 270 tablet 0   insulin glargine (LANTUS SOLOSTAR) 100 UNIT/ML Solostar Pen Inject 23 Units into the skin at bedtime. 15 mL 1   Insulin Pen Needle (CAREFINE PEN NEEDLES) 32G X 4 MM MISC Use 1x a day 100 each 3   lisinopril (ZESTRIL) 5 MG tablet Take 1 tablet (5 mg total) by mouth daily. 90 tablet 3   nadolol (CORGARD) 20 MG tablet Take 1 tablet Daily for BP & Hereditary Familial Essential Tremor 90 tablet 3   ondansetron (ZOFRAN) 4 MG tablet Take 1 tablet (4 mg total) by mouth daily as needed for nausea or vomiting. 30 tablet 1   traZODone (DESYREL) 100 MG tablet Take 1 tablet at Bedtime as needed for Sleep 90 tablet 2   azithromycin (ZITHROMAX) 250 MG tablet Take 2 tablets on  Day 1,  followed by 1 tablet  daily for 4 more days    for Sinusitis  /Bronchitis (Patient not taking: Reported on 01/22/2023) 6 each 1   furosemide (LASIX) 40 MG tablet Furosemide (Lasix) 40 mg twice daily for 3 days, then resume 40 mg daily. May take an additional 40 mg daily if needed for weight gain of 3 lb overnight or 5 lb in 1 week or shortness of breath. 180 tablet 3   gabapentin (NEURONTIN) 800 MG tablet Take 1/2 to 1 tablet at Bedtime for Neuropathy & Sleep ( & continue 100 mg capsules 3 x /day ) (Patient not taking: Reported on 01/22/2023) 90 tablet 3   ipratropium-albuterol (DUONEB) 0.5-2.5 (3) MG/3ML SOLN Take 3 mLs by nebulization every 4 (four) hours as needed. Max:6 doses per day (Patient not taking: Reported on 01/22/2023) 540 mL 3   promethazine-dextromethorphan (PROMETHAZINE-DM) 6.25-15 MG/5ML syrup Take 5 mLs by mouth 4 (four) times daily as needed for cough. (Patient not taking: Reported on 01/22/2023) 240 mL 0   No  current facility-administered medications for this visit.     Review of Systems    She denies chest pain, palpitations, dyspnea, pnd, orthopnea, n, v, dizziness, syncope, weight gain, or early satiety. All other systems reviewed and are otherwise negative except as noted above.   Physical Exam    VS:  BP (!) 144/74 (BP Location: Left Arm, Patient Position: Sitting, Cuff Size: Normal)   Pulse 74   Ht 4\' 10"  (1.473 m)   Wt 136 lb 9.6 oz (62 kg)   SpO2 100%   BMI 28.55 kg/m   GEN: Well nourished, well developed, in no acute distress. HEENT: normal. Neck: Supple, no JVD, carotid bruits, or masses. Cardiac: RRR, 2/6 murmur, no rubs, or gallops. No clubbing, cyanosis, 1+ pitting bilateral pedal edema.  Radials/DP/PT 2+ and equal bilaterally.  Respiratory:  Respirations regular and unlabored, clear to auscultation bilaterally. GI: Soft, nontender, nondistended, BS + x 4. MS: no deformity or atrophy. Skin: warm and dry, no rash. Neuro:  Strength and sensation are intact. Psych: Normal affect.  Accessory Clinical Findings    ECG personally reviewed by me today -   No EKG in office today.   Lab Results  Component Value Date   WBC 9.9 10/13/2022   HGB 12.7 10/13/2022   HCT 38.5 10/13/2022   MCV 89.5 10/13/2022   PLT 231 10/13/2022   Lab Results  Component Value  Date   CREATININE 0.77 10/13/2022   BUN 16 10/13/2022   NA 137 10/13/2022   K 4.6 10/13/2022   CL 99 10/13/2022   CO2 29 10/13/2022   Lab Results  Component Value Date   ALT 14 10/13/2022   AST 19 10/13/2022   ALKPHOS 110 06/09/2022   BILITOT 0.2 10/13/2022   Lab Results  Component Value Date   CHOL 235 (H) 10/13/2022   HDL 69 10/13/2022   LDLCALC 139 (H) 10/13/2022   TRIG 142 10/13/2022   CHOLHDL 3.4 10/13/2022    Lab Results  Component Value Date   HGBA1C 7.6 (H) 10/13/2022    Assessment & Plan    1. 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  She was started on Lasix and spironolactone.  She did not tolerate spironolactone as it caused a rash.  She has noticed recent increase in bilateral lower extremity edema.  She denies any dyspnea, PND, orthopnea, weight gain.  Will increase Lasix to 80 mg daily x 3 days followed by Lasix 40 mg daily.  She may take an additional  Lasix 40 mg daily as needed for swelling, weight gain thereafter. Reinforced sodium and fluid recommendations. She has follow-up scheduled with her PCP next week.  She states she can have a BMET drawn at that visit.     2. Nonobstructive CAD: Cath in 03/2018 revealed proximal RCA to mid RCA lesion with 60% stenosis, negative FFR, LV EF 65% by visual estimate, normal LVEDP. Medical management was advised.  Stable with no anginal symptoms. No indication for ischemic evaluation.  Continue aspirin, lisinopril.  She declines statin therapy.   3. 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  She has had recent symptoms of heart failure as above. Patient declines any further testing at this time as she states she is not interested in pursuing surgery.    4. Hypertension: Slightly elevated in office today. Generally well-controlled at home.  Continue to monitor BP and report BP consistent greater than 140/80.  If BP remains elevated below goal, consider transitioning from lisinopril to olmesartan.  Otherwise, continue current antihypertensive regimen.    5. Hyperlipidemia: LDL was 139 in 09/2022.  She declines statin therapy or referral to lipid clinic pharmD.   6. Type 2 diabetes: A1c was 7.6 in 09/2022.  Monitored and managed per PCP.      7. Tobacco use: She continues to smoke. Full cessation advised.    8. Disposition: Follow-up in 6 months, sooner if needed.  Joylene Grapes, NP 01/22/2023, 6:21 PM

## 2023-01-22 NOTE — Patient Instructions (Signed)
Medication Instructions:  Increase Furosemide (Lasix) 40 mg twice daily for 3 days, then resume 40 mg daily. May take an additional 40 mg daily if needed for weight gain of 3 lb overnight or 5 lb in 1 week or shortness of breath.  *If you need a refill on your cardiac medications before your next appointment, please call your pharmacy*   Lab Work: BMET in 1 week at PCP office.  Testing/Procedures: NONE ordered at this time of appointment     Follow-Up: At [facility], you and your health needs are our priority.  As part of our continuing mission to provide you with exceptional heart care, we have created designated Provider Care Teams.  These Care Teams include your primary Cardiologist (physician) and Advanced Practice Providers (APPs -  Physician Assistants and Nurse Practitioners) who all work together to provide you with the care you need, when you need it.  We recommend signing up for the patient portal called "MyChart".  Sign up information is provided on this After Visit Summary.  MyChart is used to connect with patients for Virtual Visits (Telemedicine).  Patients are able to view lab/test results, encounter notes, upcoming appointments, etc.  Non-urgent messages can be sent to your provider as well.   To learn more about what you can do with MyChart, go to [email or web address].    Your next appointment:   6 month(s)  Provider:   Bryan Lemma, MD

## 2023-01-26 NOTE — Progress Notes (Unsigned)
Assessment and Plan:  Diagnoses and all orders for this visit:  Atherosclerosis of abdominal aorta(HCC) Control BP, blood sugars, cholesterol and wight  Type 2 diabetes mellitus with other circulatory complication, with long-term current use of insulin (HCC) Continue medications, diet Continue to monitor blood sugars Follow up in 4 weeks for physical  Hyperlipidemia associated with type 2 diabetes mellitus (HCC) Continue diet Statin intolerance  CKD stage 2 due to type 2 diabetes mellitus (HCC) Increase fluids, avoid NSAIDS, monitor sugars, will monitor  Chronic obstructive pulmonary disease, unspecified COPD type (HCC) Strongly encouraged to decrease and stop smoking Smoking 2 PPD, not interested currently in cessation Will order chest CT at next visit as had to cut visit short to send to the ER  Tobacco use disorder Strongly encouraged to decrease and stop smoking Smoking 2 PPD, not interested currently in cessation Will order chest CT at next visit as had to cut visit short to send to the ER  Essential hypertension - continue medications, DASH diet, exercise and monitor at home. Call if greater than 130/80.   Other osteoporosis, unspecified pathological fracture presence Limited activity, does not take VIT D or calcium Declines further DEXA  Poor compliance with medication Discussed importance of following medication treatment plan  Medication management -     glimepiride (AMARYL) 4 MG tablet; TAKE ONE TABLET BY MOUTH TWICE DAILY with a meal FOR DIABETES  Type 2 diabetes mellitus with atherosclerosis of aorta (HCC) -     glimepiride (AMARYL) 4 MG tablet; TAKE ONE TABLET BY MOUTH TWICE DAILY with a meal FOR DIABETES  Insomnia due to medical condition -     traZODone (DESYREL) 50 MG tablet; Take 1/2 to 1 tablet at Bedtime if needed for Sleep      Further disposition pending results of labs. Discussed med's effects and SE's.   Over 30 minutes of exam, counseling,  chart review, and critical decision making was performed.   Future Appointments  Date Time Provider Department Center  01/27/2023  4:00 PM Raynelle Dick, NP [facility] None  07/06/2023  2:00 PM Raynelle Dick, NP [facility] None    ------------------------------------------------------------------------------------------------------------------   HPI There were no vitals taken for this visit. 72 y.o.female presents for  On 05/26/22 she was walking and had to catch herself and twisted her knee.  It is swollen red and very painful. She has tried voltaren gel and meloxicam with no relief of pain.  Very difficult to walk due to pain.  She is currently on Lisinopril 2.5 mg QD and Nadolol 20 mg QD and BP's have been controlled. She is in a lot of pain today due to her leg. BP Readings from Last 3 Encounters:  01/22/23 (!) 144/74  11/19/22 (!) 142/88  10/13/22 (!) 148/78   She is currently on Lantus 23 units once a day at bedtime and glimipiride 4 mg BID Lab Results  Component Value Date   HGBA1C 7.6 (H) 10/13/2022    She continues to have issues with insomnia- is no longer using Alprazolam but does want to go back on Trazodone as it worked in the past.   She is have horrible itching of her skin due to insulin allergy.  Has scratched several sores on her upper shoulders- is out of hydroxyzine but this does help.   Past Medical History:  Diagnosis Date   Anxiety    Anxiety disorder    Cirrhosis (HCC)    COPD (chronic obstructive pulmonary disease) (HCC)    followed  by pcp--  last exacerbation 11/ 2017   Coronary artery disease, non-occlusive 03/2018   Cardiac Cath: Moderate (60%) proximal-mid RCA (FFR 0.89 => NEGATIVE / not physiologically significant).  Normal EF and EDP.   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--  "gets real sharp stabbing pain over eye and speech screws up"  pt states takes  excederin migraine and lies down (pt stated was told if she "if come to ER again they would send her to psych ward")   Hyperlipidemia    Hypertension    OA (osteoarthritis)    back, hips, knees, feet   Osteoporosis    Polyp of colon, hyperplastic    Pulmonary nodules    per CT chest 08-28-2016  -- bilateral upper lobe nodules   S/P right hip fracture 09/06/2019   Stroke [facility])    Tubular adenoma of colon    Type 2 diabetes mellitus (HCC)    followed by pcp--  last A1c 9.1 on 08-01-2016   UTI (urinary tract infection)    Wears dentures      Allergies  Allergen Reactions   Actos [Pioglitazone] Other (See Comments)    Patient stated that it elevated her BGL   Glyburide Other (See Comments)    Patient stated that it elevated her BGL   Metformin And Related     "metformin caused mini-strokes" and ELEVATED BS and also GLYBURIDE, ONGLYZA, ACTOS, INVOKANA caused elevated BS   Onglyza [Saxagliptin] Other (See Comments)    Patient stated that it elevated her BGL   Rosuvastatin Diarrhea   Insulins Rash   Janumet [Sitagliptin-Metformin Hcl] Rash and Other (See Comments)    Shakes/ rash   Penicillins Rash and Other (See Comments)   Spironolactone Rash    Current Outpatient Medications on File Prior to Visit  Medication Sig   acetaminophen (TYLENOL) 500 MG tablet Take 500 mg by mouth as directed. Take 2 tablets three times ad ay as needed   aspirin EC 81 MG tablet Take 81 mg by mouth daily.   azithromycin (ZITHROMAX) 250 MG tablet Take 2 tablets on  Day 1,  followed by 1 tablet  daily for 4 more days    for Sinusitis  /Bronchitis (Patient not taking: Reported on 01/22/2023)   divalproex (DEPAKOTE) 250 MG DR tablet Take 1 tablet 3 x /day for Hereditary tremor or "Shaking Palsy"   DULoxetine (CYMBALTA) 20 MG capsule Take  1 capsule  Daily  for Chronic Pain   furosemide (LASIX) 40 MG tablet Furosemide (Lasix) 40 mg twice daily for 3 days, then resume 40 mg daily. May take an additional 40 mg  daily if needed for weight gain of 3 lb overnight or 5 lb in 1 week or shortness of breath.   gabapentin (NEURONTIN) 100 MG capsule Take 1 capsule (100 mg total) by mouth 3 (three) times daily.   gabapentin (NEURONTIN) 800 MG tablet Take 1/2 to 1 tablet at Bedtime for Neuropathy & Sleep ( & continue 100 mg capsules 3 x /day ) (Patient not taking: Reported on 01/22/2023)   glimepiride (AMARYL) 4 MG tablet TAKE ONE TABLET BY MOUTH TWICE DAILY with a meal FOR DIABETES   hydrOXYzine (ATARAX) 25 MG tablet Take 1 tablet by mouth 3 times daily for Nerves /Anxiety or 1 to 2 tablets at Bedtime as needed for Sleep   insulin  glargine (LANTUS SOLOSTAR) 100 UNIT/ML Solostar Pen Inject 23 Units into the skin at bedtime.   Insulin Pen Needle (CAREFINE PEN NEEDLES) 32G X 4 MM MISC Use 1x a day   ipratropium-albuterol (DUONEB) 0.5-2.5 (3) MG/3ML SOLN Take 3 mLs by nebulization every 4 (four) hours as needed. Max:6 doses per day (Patient not taking: Reported on 01/22/2023)   lisinopril (ZESTRIL) 5 MG tablet Take 1 tablet (5 mg total) by mouth daily.   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   promethazine-dextromethorphan (PROMETHAZINE-DM) 6.25-15 MG/5ML syrup Take 5 mLs by mouth 4 (four) times daily as needed for cough. (Patient not taking: Reported on 01/22/2023)   traZODone (DESYREL) 100 MG tablet Take 1 tablet at Bedtime as needed for Sleep   No current facility-administered medications on file prior to visit.    ROS: all negative except above.   Physical Exam:  There were no vitals taken for this visit.  General Appearance: Well nourished, in no apparent distress. Eyes: PERRLA, EOMs, conjunctiva no swelling or erythema Sinuses: No Frontal/maxillary tenderness ENT/Mouth: Ext aud canals clear, TMs without erythema, bulging. No erythema,  swelling, or exudate on post pharynx.  Tonsils not swollen or erythematous. Hearing normal.  Neck: Supple, thyroid normal.  Respiratory: Respiratory effort normal, BS equal bilaterally without rales, rhonchi, wheezing or stridor.  Cardio: RRR with no MRGs. Brisk peripheral pulses without edema.  Abdomen: Soft, + BS.  Non tender, no guarding, rebound, hernias, masses. Lymphatics: Non tender without lymphadenopathy.  Musculoskeletal:Decrease ROM of left lower leg. Very tender over knee and above- unable to tolerate a complete exam  Skin: Warm, dry. Erythema and warmth of left knee. Several sores on shoulders that are scabbed with surrounding erythema Neuro: Cranial nerves intact. Normal muscle tone, no cerebellar symptoms. Sensation intact.  Psych: Awake and oriented X 3, normal affect, Insight and Judgment appropriate.     Raynelle Dick, NP 8:37 AM [facility]

## 2023-01-27 ENCOUNTER — Ambulatory Visit (INDEPENDENT_AMBULATORY_CARE_PROVIDER_SITE_OTHER): Payer: PPO | Admitting: Nurse Practitioner

## 2023-01-27 ENCOUNTER — Encounter: Payer: Self-pay | Admitting: Nurse Practitioner

## 2023-01-27 VITALS — BP 194/76 | HR 88 | Temp 97.5°F | Ht <= 58 in | Wt 135.8 lb

## 2023-01-27 DIAGNOSIS — J449 Chronic obstructive pulmonary disease, unspecified: Secondary | ICD-10-CM | POA: Diagnosis not present

## 2023-01-27 DIAGNOSIS — F17200 Nicotine dependence, unspecified, uncomplicated: Secondary | ICD-10-CM | POA: Diagnosis not present

## 2023-01-27 DIAGNOSIS — E1159 Type 2 diabetes mellitus with other circulatory complications: Secondary | ICD-10-CM

## 2023-01-27 DIAGNOSIS — I700 Atherosclerosis of aorta: Secondary | ICD-10-CM | POA: Diagnosis not present

## 2023-01-27 DIAGNOSIS — N182 Chronic kidney disease, stage 2 (mild): Secondary | ICD-10-CM

## 2023-01-27 DIAGNOSIS — J438 Other emphysema: Secondary | ICD-10-CM

## 2023-01-27 DIAGNOSIS — G250 Essential tremor: Secondary | ICD-10-CM | POA: Diagnosis not present

## 2023-01-27 DIAGNOSIS — Z79899 Other long term (current) drug therapy: Secondary | ICD-10-CM | POA: Diagnosis not present

## 2023-01-27 DIAGNOSIS — E1122 Type 2 diabetes mellitus with diabetic chronic kidney disease: Secondary | ICD-10-CM

## 2023-01-27 DIAGNOSIS — E785 Hyperlipidemia, unspecified: Secondary | ICD-10-CM | POA: Diagnosis not present

## 2023-01-27 DIAGNOSIS — I10 Essential (primary) hypertension: Secondary | ICD-10-CM | POA: Diagnosis not present

## 2023-01-27 DIAGNOSIS — E1151 Type 2 diabetes mellitus with diabetic peripheral angiopathy without gangrene: Secondary | ICD-10-CM | POA: Diagnosis not present

## 2023-01-27 DIAGNOSIS — Z91148 Patient's other noncompliance with medication regimen for other reason: Secondary | ICD-10-CM | POA: Diagnosis not present

## 2023-01-27 DIAGNOSIS — E1140 Type 2 diabetes mellitus with diabetic neuropathy, unspecified: Secondary | ICD-10-CM

## 2023-01-27 DIAGNOSIS — I5032 Chronic diastolic (congestive) heart failure: Secondary | ICD-10-CM

## 2023-01-27 DIAGNOSIS — K7581 Nonalcoholic steatohepatitis (NASH): Secondary | ICD-10-CM | POA: Diagnosis not present

## 2023-01-27 DIAGNOSIS — R809 Proteinuria, unspecified: Secondary | ICD-10-CM

## 2023-01-27 DIAGNOSIS — K7460 Unspecified cirrhosis of liver: Secondary | ICD-10-CM

## 2023-01-27 DIAGNOSIS — M818 Other osteoporosis without current pathological fracture: Secondary | ICD-10-CM

## 2023-01-27 DIAGNOSIS — E1169 Type 2 diabetes mellitus with other specified complication: Secondary | ICD-10-CM | POA: Diagnosis not present

## 2023-01-27 DIAGNOSIS — E559 Vitamin D deficiency, unspecified: Secondary | ICD-10-CM

## 2023-01-27 DIAGNOSIS — E1129 Type 2 diabetes mellitus with other diabetic kidney complication: Secondary | ICD-10-CM

## 2023-01-27 DIAGNOSIS — Z794 Long term (current) use of insulin: Secondary | ICD-10-CM

## 2023-01-27 LAB — CBC WITH DIFFERENTIAL/PLATELET
Absolute Monocytes: 680 cells/uL (ref 200–950)
HCT: 37.7 % (ref 35.0–45.0)
Hemoglobin: 12.4 g/dL (ref 11.7–15.5)
MCH: 30.1 pg (ref 27.0–33.0)
MCV: 91.5 fL (ref 80.0–100.0)
Neutrophils Relative %: 62.9 %
Platelets: 176 10*3/uL (ref 140–400)
RDW: 12.9 % (ref 11.0–15.0)
Total Lymphocyte: 19.9 %
WBC: 8 10*3/uL (ref 3.8–10.8)

## 2023-01-27 MED ORDER — DIVALPROEX SODIUM 250 MG PO DR TAB
DELAYED_RELEASE_TABLET | ORAL | 1 refills | Status: DC
Start: 2023-01-27 — End: 2023-07-14

## 2023-01-27 MED ORDER — OLMESARTAN MEDOXOMIL 40 MG PO TABS
40.0000 mg | ORAL_TABLET | Freq: Every day | ORAL | 11 refills | Status: DC
Start: 2023-01-27 — End: 2023-07-07

## 2023-01-27 MED ORDER — LANTUS SOLOSTAR 100 UNIT/ML ~~LOC~~ SOPN
23.0000 [IU] | PEN_INJECTOR | Freq: Every day | SUBCUTANEOUS | 1 refills | Status: DC
Start: 2023-01-27 — End: 2023-08-18

## 2023-01-27 NOTE — Patient Instructions (Addendum)
Stop Lisinopril and start Olmesartan 40 mg daily  Continue Nadolol 20 mg 1 tab daily  Olmesartan Tablets What is this medication? OLMESARTAN (all mi SAR tan) treats high blood pressure. It works by relaxing blood vessels, which decreases the amount of work the heart has to do. It belongs to a group of medications called ARBs. This medicine may be used for other purposes; ask your health care provider or pharmacist if you have questions. COMMON BRAND NAME(S): Benicar What should I tell my care team before I take this medication? They need to know if you have any of these conditions: If you are on a special diet, such as a low-salt diet Kidney or liver disease An unusual or allergic reaction to olmesartan, other medications, foods, dyes, or preservatives Pregnant or trying to get pregnant Breast-feeding How should I use this medication? Take this medication by mouth. Take it as directed on the prescription label at the same time every day. You can take it with or without food. If it upsets your stomach, take it with food. Keep taking it unless your care team tells you to stop. Talk to your care team about the use of this medication in children. While it may be prescribed for children as young as 6 years for selected conditions, precautions do apply. Overdosage: If you think you have taken too much of this medicine contact a poison control center or emergency room at once. NOTE: This medicine is only for you. Do not share this medicine with others. What if I miss a dose? If you miss a dose, take it as soon as you can. If it is almost time for your next dose, take only that dose. Do not take double or extra doses. What may interact with this medication? This medication may interact with the following: Blood pressure medications Diuretics, especially triamterene, spironolactone or amiloride Potassium salts or potassium supplements This list may not describe all possible interactions. Give your  health care provider a list of all the medicines, herbs, non-prescription drugs, or dietary supplements you use. Also tell them if you smoke, drink alcohol, or use illegal drugs. Some items may interact with your medicine. What should I watch for while using this medication? Visit your care team for regular checks on your progress. Check your blood pressure as directed. Ask your care team what your blood pressure should be and when you should contact them. Call your care team if you notice an irregular or fast heart beat. Women should inform their care team if they wish to become pregnant or think they might be pregnant. There is a potential for serious side effects to an unborn child, particularly in the second or third trimester. Talk to your care team or pharmacist for more information. You may get drowsy or dizzy. Do not drive, use machinery, or do anything that needs mental alertness until you know how this medication affects you. Do not stand or sit up quickly, especially if you are an older patient. This reduces the risk of dizzy or fainting spells. Alcohol can make you more drowsy and dizzy. Avoid alcoholic drinks. Avoid salt substitutes unless you are told otherwise by your care team. Do not treat yourself for coughs, colds, or pain while you are taking this medication without asking your care team for advice. Some ingredients may increase your blood pressure. What side effects may I notice from receiving this medication? Side effects that you should report to your care team as soon as possible: Allergic reactions--skin  rash, itching, hives, swelling of the face, lips, tongue, or throat High potassium level--muscle weakness, fast or irregular heartbeat Kidney injury--decrease in the amount of urine, swelling of the ankles, hands, or feet Low blood pressure--dizziness, feeling faint or lightheaded, blurry vision Severe diarrhea Side effects that usually do not require medical attention (report  to your care team if they continue or are bothersome): Dizziness Fatigue Headache This list may not describe all possible side effects. Call your doctor for medical advice about side effects. You may report side effects to FDA at 1-800-FDA-1088. Where should I keep my medication? Keep out of the reach of children and pets. Store at room temperature between 20 and 25 degrees C (68 and 77 degrees F). Throw away any unused medication after the expiration date. NOTE: This sheet is a summary. It may not cover all possible information. If you have questions about this medicine, talk to your doctor, pharmacist, or health care provider.  2024 Elsevier/Gold Standard (2021-08-07 00:00:00)

## 2023-01-28 LAB — HEMOGLOBIN A1C
Hgb A1c MFr Bld: 6.4 % of total Hgb — ABNORMAL HIGH (ref <5.7)
Mean Plasma Glucose: 137 mg/dL
eAG (mmol/L): 7.6 mmol/L

## 2023-01-28 LAB — COMPLETE METABOLIC PANEL WITH GFR
AG Ratio: 1 (calc) (ref 1.0–2.5)
ALT: 14 U/L (ref 6–29)
AST: 20 U/L (ref 10–35)
Albumin: 3 g/dL — ABNORMAL LOW (ref 3.6–5.1)
Alkaline phosphatase (APISO): 91 U/L (ref 37–153)
BUN: 17 mg/dL (ref 7–25)
CO2: 31 mmol/L (ref 20–32)
Calcium: 8.9 mg/dL (ref 8.6–10.4)
Chloride: 99 mmol/L (ref 98–110)
Creat: 0.74 mg/dL (ref 0.60–1.00)
Globulin: 3 g/dL (calc) (ref 1.9–3.7)
Glucose, Bld: 91 mg/dL (ref 65–99)
Potassium: 4.5 mmol/L (ref 3.5–5.3)
Sodium: 135 mmol/L (ref 135–146)
Total Bilirubin: 0.2 mg/dL (ref 0.2–1.2)
Total Protein: 6 g/dL — ABNORMAL LOW (ref 6.1–8.1)
eGFR: 86 mL/min/{1.73_m2} (ref >=60)

## 2023-01-28 LAB — URINALYSIS, ROUTINE W REFLEX MICROSCOPIC
Bacteria, UA: NONE SEEN /HPF
Bilirubin Urine: NEGATIVE
Glucose, UA: NEGATIVE
Hyaline Cast: NONE SEEN /LPF
Ketones, ur: NEGATIVE
Nitrite: NEGATIVE
Specific Gravity, Urine: 1.007 (ref 1.001–1.035)
Squamous Epithelial / HPF: NONE SEEN /HPF (ref <=5)
WBC, UA: >=60 /HPF — AB (ref 0–5)
pH: 7 (ref 5.0–8.0)

## 2023-01-28 LAB — LIPID PANEL
Cholesterol: 243 mg/dL — ABNORMAL HIGH (ref <200)
HDL: 56 mg/dL (ref >=50)
LDL Cholesterol (Calc): 155 mg/dL (calc) — ABNORMAL HIGH
Non-HDL Cholesterol (Calc): 187 mg/dL (calc) — ABNORMAL HIGH (ref <130)
Total CHOL/HDL Ratio: 4.3 (calc) (ref <5.0)
Triglycerides: 181 mg/dL — ABNORMAL HIGH (ref <150)

## 2023-01-28 LAB — TSH: TSH: 1.32 mIU/L (ref 0.40–4.50)

## 2023-01-28 LAB — MICROALBUMIN / CREATININE URINE RATIO
Creatinine, Urine: 12 mg/dL — ABNORMAL LOW (ref 20–275)
Microalb Creat Ratio: 7092 mg/g creat — ABNORMAL HIGH (ref <30)
Microalb, Ur: 85.1 mg/dL

## 2023-01-28 LAB — CBC WITH DIFFERENTIAL/PLATELET
Basophils Absolute: 72 cells/uL (ref 0–200)
Basophils Relative: 0.9 %
Eosinophils Absolute: 624 cells/uL — ABNORMAL HIGH (ref 15–500)
Eosinophils Relative: 7.8 %
Lymphs Abs: 1592 cells/uL (ref 850–3900)
MCHC: 32.9 g/dL (ref 32.0–36.0)
MPV: 9.4 fL (ref 7.5–12.5)
Monocytes Relative: 8.5 %
Neutro Abs: 5032 cells/uL (ref 1500–7800)
RBC: 4.12 10*6/uL (ref 3.80–5.10)

## 2023-01-28 LAB — MAGNESIUM: Magnesium: 1.9 mg/dL (ref 1.5–2.5)

## 2023-02-01 MED ORDER — KERENDIA 10 MG PO TABS
10.0000 mg | ORAL_TABLET | Freq: Every day | ORAL | 2 refills | Status: DC
Start: 2023-02-01 — End: 2023-03-02

## 2023-02-01 NOTE — Addendum Note (Signed)
Addended by: Anda Kraft E on: 02/01/2023 04:20 PM   Modules accepted: Orders

## 2023-02-02 ENCOUNTER — Telehealth: Payer: Self-pay

## 2023-02-02 NOTE — Telephone Encounter (Signed)
Prior [place] for [employer] completed and submitted.

## 2023-02-04 NOTE — Telephone Encounter (Signed)
Prior auth approved through 02/02/24

## 2023-02-17 ENCOUNTER — Other Ambulatory Visit: Payer: Self-pay | Admitting: Nurse Practitioner

## 2023-02-17 DIAGNOSIS — L299 Pruritus, unspecified: Secondary | ICD-10-CM

## 2023-03-01 NOTE — Progress Notes (Unsigned)
Assessment and Plan:  There are no diagnoses linked to this encounter.    Further disposition pending results of labs. Discussed med's effects and SE's.   Over 30 minutes of exam, counseling, chart review, and critical decision making was performed.   Future Appointments  Date Time Provider Department Center  03/02/2023  3:45 PM Raynelle Dick, NP [facility] None  07/06/2023  2:00 PM Raynelle Dick, NP [facility] None    ------------------------------------------------------------------------------------------------------------------   HPI There were no vitals taken for this visit. 72 y.o.female presents for  Past Medical History:  Diagnosis Date   Anxiety    Anxiety disorder    Cirrhosis (HCC)    COPD (chronic obstructive pulmonary disease) (HCC)    followed by pcp--  last exacerbation 11/ 2017   Coronary artery disease, non-occlusive 03/2018   Cardiac Cath: Moderate (60%) proximal-mid RCA (FFR 0.89 => NEGATIVE / not physiologically significant).  Normal EF and EDP.   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--  "gets real sharp stabbing pain over eye and speech screws up"  pt states takes excederin migraine and lies down (pt stated was told if she "if come to ER again they would send her to psych ward")   Hyperlipidemia    Hypertension    OA (osteoarthritis)    back, hips, knees, feet   Osteoporosis    Polyp of colon, hyperplastic    Pulmonary nodules    per CT chest 08-28-2016  -- bilateral upper lobe nodules   S/P right hip fracture 09/06/2019   Stroke [facility])    Tubular adenoma of colon    Type 2 diabetes mellitus (HCC)    followed by pcp--  last A1c 9.1 on 08-01-2016   UTI (urinary tract infection)    Wears dentures      Allergies  Allergen Reactions   Actos [Pioglitazone] Other (See Comments)    Patient stated that it elevated her BGL   Glyburide Other (See Comments)     Patient stated that it elevated her BGL   Metformin And Related     "metformin caused mini-strokes" and ELEVATED BS and also GLYBURIDE, ONGLYZA, ACTOS, INVOKANA caused elevated BS   Onglyza [Saxagliptin] Other (See Comments)    Patient stated that it elevated her BGL   Rosuvastatin Diarrhea   Insulins Rash   Janumet [Sitagliptin-Metformin Hcl] Rash and Other (See Comments)    Shakes/ rash   Penicillins Rash and Other (See Comments)   Spironolactone Rash    Current Outpatient Medications on File Prior to Visit  Medication Sig   acetaminophen (TYLENOL) 500 MG tablet Take 500 mg by mouth as directed. Take 2 tablets three times ad ay as needed   aspirin EC 81 MG tablet Take 81 mg by mouth daily.   divalproex (DEPAKOTE) 250 MG DR tablet Take 1 tablet 3 x /day for Hereditary tremor or "Shaking Palsy"   DULoxetine (CYMBALTA) 20 MG capsule Take  1 capsule  Daily  for Chronic Pain   Finerenone (KERENDIA) 10 MG TABS Take 1 tablet (10 mg total) by mouth daily.   furosemide (LASIX) 40 MG tablet Furosemide (Lasix) 40 mg twice daily for 3 days, then resume 40 mg daily. May take an additional 40 mg daily if needed for weight gain of 3 lb overnight or 5 lb in 1 week or shortness of breath.  gabapentin (NEURONTIN) 100 MG capsule Take 1 capsule (100 mg total) by mouth 3 (three) times daily.   glimepiride (AMARYL) 4 MG tablet TAKE ONE TABLET BY MOUTH TWICE DAILY with a meal FOR DIABETES   hydrOXYzine (ATARAX) 25 MG tablet Take 1 tablet by mouth 3 times daily for Nerves /Anxiety or 1 to 2 tablets at Bedtime as needed for Sleep   insulin glargine (LANTUS SOLOSTAR) 100 UNIT/ML Solostar Pen Inject 23 Units into the skin at bedtime.   Insulin Pen Needle (CAREFINE PEN NEEDLES) 32G X 4 MM MISC Use 1x a day   ipratropium-albuterol (DUONEB) 0.5-2.5 (3) MG/3ML SOLN Take 3 mLs by nebulization every 4 (four) hours as needed. Max:6 doses per day   nadolol (CORGARD) 20 MG tablet Take 1 tablet Daily for BP & Hereditary  Familial Essential Tremor   olmesartan (BENICAR) 40 MG tablet Take 1 tablet (40 mg total) by mouth daily.   ondansetron (ZOFRAN) 4 MG tablet Take 1 tablet (4 mg total) by mouth daily as needed for nausea or vomiting.   traZODone (DESYREL) 100 MG tablet Take 1 tablet at Bedtime as needed for Sleep   No current facility-administered medications on file prior to visit.    ROS: all negative except above.   Physical Exam:  There were no vitals taken for this visit.  General Appearance: Well nourished, in no apparent distress. Eyes: PERRLA, EOMs, conjunctiva no swelling or erythema Sinuses: No Frontal/maxillary tenderness ENT/Mouth: Ext aud canals clear, TMs without erythema, bulging. No erythema, swelling, or exudate on post pharynx.  Tonsils not swollen or erythematous. Hearing normal.  Neck: Supple, thyroid normal.  Respiratory: Respiratory effort normal, BS equal bilaterally without rales, rhonchi, wheezing or stridor.  Cardio: RRR with no MRGs. Brisk peripheral pulses without edema.  Abdomen: Soft, + BS.  Non tender, no guarding, rebound, hernias, masses. Lymphatics: Non tender without lymphadenopathy.  Musculoskeletal: Full ROM, 5/5 strength, normal gait.  Skin: Warm, dry without rashes, lesions, ecchymosis.  Neuro: Cranial nerves intact. Normal muscle tone, no cerebellar symptoms. Sensation intact.  Psych: Awake and oriented X 3, normal affect, Insight and Judgment appropriate.     Raynelle Dick, NP 1:19 PM [facility]

## 2023-03-02 ENCOUNTER — Ambulatory Visit (INDEPENDENT_AMBULATORY_CARE_PROVIDER_SITE_OTHER): Payer: PPO | Admitting: Nurse Practitioner

## 2023-03-02 ENCOUNTER — Encounter: Payer: Self-pay | Admitting: Nurse Practitioner

## 2023-03-02 VITALS — BP 138/72 | HR 88 | Temp 97.5°F | Ht <= 58 in | Wt 141.8 lb

## 2023-03-02 DIAGNOSIS — E1129 Type 2 diabetes mellitus with other diabetic kidney complication: Secondary | ICD-10-CM | POA: Diagnosis not present

## 2023-03-02 DIAGNOSIS — I700 Atherosclerosis of aorta: Secondary | ICD-10-CM

## 2023-03-02 DIAGNOSIS — E1122 Type 2 diabetes mellitus with diabetic chronic kidney disease: Secondary | ICD-10-CM | POA: Diagnosis not present

## 2023-03-02 DIAGNOSIS — Z79899 Other long term (current) drug therapy: Secondary | ICD-10-CM

## 2023-03-02 DIAGNOSIS — R809 Proteinuria, unspecified: Secondary | ICD-10-CM

## 2023-03-02 DIAGNOSIS — E1151 Type 2 diabetes mellitus with diabetic peripheral angiopathy without gangrene: Secondary | ICD-10-CM | POA: Diagnosis not present

## 2023-03-02 DIAGNOSIS — I10 Essential (primary) hypertension: Secondary | ICD-10-CM | POA: Diagnosis not present

## 2023-03-02 DIAGNOSIS — E1140 Type 2 diabetes mellitus with diabetic neuropathy, unspecified: Secondary | ICD-10-CM

## 2023-03-02 DIAGNOSIS — N182 Chronic kidney disease, stage 2 (mild): Secondary | ICD-10-CM

## 2023-03-02 MED ORDER — GLIMEPIRIDE 4 MG PO TABS
ORAL_TABLET | ORAL | 0 refills | Status: DC
Start: 2023-03-02 — End: 2023-06-02

## 2023-03-02 MED ORDER — KERENDIA 10 MG PO TABS
10.0000 mg | ORAL_TABLET | Freq: Every day | ORAL | 2 refills | Status: DC
Start: 2023-03-02 — End: 2023-07-07

## 2023-03-02 MED ORDER — GABAPENTIN 100 MG PO CAPS
100.0000 mg | ORAL_CAPSULE | Freq: Three times a day (TID) | ORAL | 0 refills | Status: DC
Start: 2023-03-02 — End: 2023-07-07

## 2023-03-02 MED ORDER — DULOXETINE HCL 20 MG PO CPEP
ORAL_CAPSULE | ORAL | 3 refills | Status: AC
Start: 2023-03-02 — End: ?

## 2023-03-02 NOTE — Patient Instructions (Signed)
Microalbumin Test- elevated so begin [place] 1 tab daily Why am I having this test? Albumin is a protein in the body that helps regulate how much fluid is in the blood. Normally, when the kidneys filter waste from the bloodstream, waste leaves the body through urine, and important substances, such as albumin, remain in the blood. If the kidneys are damaged, they may no longer be able to keep albumin in the blood. This leads to small amounts of albumin (microalbumin, MA) in the urine. You may have this test: If you have signs of kidney damage, such as foamy urine or swelling in the hands, feet, abdomen, or face. To help evaluate kidney function after other kidney test results have been normal. To monitor treatment for diabetes, also called diabetes mellitus, especially if your blood sugar (glucose) has been poorly controlled or you have signs of kidney damage. MA in the urine is a common complication of diabetes. To monitor for complications caused by high blood pressure (hypertension). To help diagnose cardiovascular disease or a heart attack. What is being tested? This test measures the amount of MA in your urine. You may have your creatinine level tested at the same time as MA testing. Creatinine is a waste product that the kidneys filter out of the blood. Testing creatinine levels provides more information about kidney function. What kind of sample is taken?  A urine sample is required for this test. You may be asked to collect urine samples at home over a period of 24 hours. How do I collect samples at home? When collecting a urine sample at home, make sure you: Use supplies and instructions that you received from the lab. Collect urine only in the germ-free (sterile) cup that you received from the lab. Do not let any toilet paper or stool (feces) get into the cup. Refrigerate the sample until you can return it to the lab. Return the sample(s) to the lab as instructed. How do I prepare for  this test? Follow instructions from your health care provider about changing or stopping your regular medicines. This is especially important if you are taking diabetes medicines or blood thinners. Tell a health care provider about: Any medical conditions you have. All medicines you are taking, including vitamins, herbs, eye drops, creams, and over-the-counter medicines. Whether you are pregnant or may be pregnant. How are the results reported? Your test results will be reported as a value that indicates how much MA is in your urine. This may be given as: Milligrams of MA per deciliter of urine (mg/dL). Milligrams of MA per gram of creatinine (mg/g creatinine), if creatinine was also tested. Your health care provider will compare your results to normal ranges that were established after testing a large group of people (reference ranges). Reference ranges may vary among labs and hospitals. For this test, common reference ranges are: MA only: 0-2 mg/dL. MA and creatinine: Men: 0-17 mg/g creatinine. Women: 0-25 mg/g creatinine. What do the results mean? A result that is within the reference range means that you have a normal amount of MA in your urine. This may mean that: Your kidneys are functioning normally. Your diabetes or hypertension treatment is working effectively. Results that are higher than the reference range may mean that you have: Poorly controlled diabetes. Your treatment plan may need to be adjusted. Narrowing of the arteries caused by plaque buildup (atherosclerosis). Kidney damage caused by hypertension or diabetes (nephropathy). Talk with your health care provider about what your results mean. Questions to  ask your health care provider Ask your health care provider, or the department that is doing the test: When will my results be ready? How will I get my results? What are my treatment options? What other tests do I need? What are my next steps? Summary Albumin is a  protein that helps regulate how much fluid is in your blood. If your kidneys are damaged, they may no longer be able to keep albumin in your blood. This leads to small amounts of albumin (microalbumin, MA) in your urine. Results showing high MA levels may indicate nephropathy, which is kidney damage from diabetes or high blood pressure. Talk with your health care provider about what your results mean. This information is not intended to replace advice given to you by your health care provider. Make sure you discuss any questions you have with your health care provider. Document Revised: 10/06/2019 Document Reviewed: 10/06/2019 Elsevier Patient Education  2024 [employer].

## 2023-04-28 ENCOUNTER — Other Ambulatory Visit: Payer: Self-pay | Admitting: Nurse Practitioner

## 2023-04-28 DIAGNOSIS — L299 Pruritus, unspecified: Secondary | ICD-10-CM

## 2023-05-17 ENCOUNTER — Other Ambulatory Visit: Payer: Self-pay | Admitting: Nurse Practitioner

## 2023-05-17 DIAGNOSIS — E1140 Type 2 diabetes mellitus with diabetic neuropathy, unspecified: Secondary | ICD-10-CM

## 2023-06-02 ENCOUNTER — Other Ambulatory Visit: Payer: Self-pay | Admitting: Nurse Practitioner

## 2023-06-02 DIAGNOSIS — Z79899 Other long term (current) drug therapy: Secondary | ICD-10-CM

## 2023-06-02 DIAGNOSIS — E1159 Type 2 diabetes mellitus with other circulatory complications: Secondary | ICD-10-CM

## 2023-06-03 ENCOUNTER — Encounter: Payer: PPO | Admitting: Nurse Practitioner

## 2023-06-08 ENCOUNTER — Other Ambulatory Visit: Payer: Self-pay | Admitting: Nurse Practitioner

## 2023-06-08 DIAGNOSIS — G4701 Insomnia due to medical condition: Secondary | ICD-10-CM

## 2023-07-05 NOTE — Progress Notes (Unsigned)
CPE  Assessment:   Sara Lamb was seen today for CPE  Diagnoses and all orders for this visit:  Encounter for Annual Physical Exam with abnormal findings Due annually  Health Maintenance reviewed Healthy lifestyle reviewed and goals set  Declines all vaccines, mammogram, colon cancer screening, DEXA follow up despite discussion of risks. She is requesting minimal interventions. She is agreeable for low dose CT scan for lung cancer screening.   Atherosclerosis of abdominal aorta (HCC) - per CT 12/2016 Control blood pressure, cholesterol, glucose, increase exercise.  STOP SMOKING Refer back to cardiology for evaluation - EKG   CAD STOP SMOKING Continue medications, denies angina Follows with Cardiology, Dr Herbie Baltimore. Hasn't seen for 18 months Control blood pressure, cholesterol, glucose, increase exercise.  Pitting edema of lower legs  Essential hypertension Monitor blood pressure at home; call if consistently over 130/80 Continue DASH diet.   Reminder to go to the ER if any CP, SOB, nausea, dizziness, severe HA, changes vision/speech, left arm numbness and tingling and jaw pain.  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 Schedule diabetes eye exam  - plan Foot exam completed -     Hemoglobin A1c -     Lipid panel  Liver cirrhosis secondary to NASH (HCC) Declines GI follow up at this time; working on weight loss, low processed carbohydrate diet for NASH management -     COMPLETE METABOLIC PANEL WITH GFR  Chronic obstructive pulmonary disease, unspecified COPD type (HCC) Denies sx; STOP smoking; denies sx; monitor   Hyperlipidemia associated with type 2 diabetes mellitus (HCC) Titrate as tolerated for LDL goal <70 Several agents with reported SE/intolerances, has not tried other statins - low dose  pravastatin discussed, she is receptive, sent in to try;  Discussed dietary and exercise modifications. -     Lipid panel  Osteoporosis, unspecified osteoporosis type, unspecified pathological fracture presence Due for DEXA scan -patient declines at this time  Discussed Calcium and Vit D supplementation Weight bearing exercise encouraged STOP SMOKING  Tobacco use disorder Discussed smoking cessation.  Reports she is smoking 2 packs a day.  Has been using gum to help.  Reports she has tried chantix, not ready to quit. Encouraged to taper down.  Will continue to assess readiness. -continue low dose annual CT screening - ordered today, last was 2021  Chronic hyponatremia Monitor;  -     COMPLETE METABOLIC PANEL WITH GFR  Anxiety Improved with Cymbalta; reviewed goal to avoid daily use of xanax, continue to taper, declines alternative med option for sleep today Close follow up 3 months Stress management techniques discussed, increase water, good sleep hygiene discussed, increase exercise, and increase veggies.    Vitamin D deficiency -     VITAMIN D 25 Hydroxy (Vit-D Deficiency, Fractures)  Poor compliance with medication Discussed this at length with patient.  Denies current barriers  Localized edema Begin Furosemide 40 mg 1 tab daily Wrap legs in Ace wraps firmly at beginning of each day and take off at bedtime Follow up with Dr. Herbie Baltimore office tomorrow Go to the ER if any chest pain, shortness of breath, nausea, dizziness, severe HA, changes vision/speech, legs start to weep   Medication Management - CBC. TSH. Magnesium   Over 40 minutes of exam, counseling, chart review and critical decision making was performed Future Appointments  Date Time Provider Department Center  07/06/2023  2:00 PM Raynelle Dick, NP [facility] None  07/05/2024  2:00 PM Raynelle Dick, NP [facility] None     Subjective:  Sara Lamb is a 72 y.o. MWF female who presents for CPE.  She has Vitamin D deficiency; Anxiety; Essential hypertension; Hyperlipidemia associated with type 2 diabetes mellitus (HCC); Poor compliance with medication; Tobacco use disorder; COPD (chronic obstructive pulmonary disease) (HCC); Atherosclerosis of abdominal aorta (HCC) - per CT 12/2016; Osteoporosis; Liver cirrhosis secondary to NASH (HCC); Cognitive dysfunction; Coronary artery disease, non-occlusive: RCA 60%, FFR Negative.; Chronic hyponatremia; Type 2 diabetes mellitus with circulatory disorder (HCC); Chest pain with low risk for cardiac etiology; Overweight (BMI 25.0-29.9); CKD stage 2 due to type 2 diabetes mellitus (HCC); and Rash and other nonspecific skin eruption on their problem list.  She is married, 1 son. She declines most cancer screenings, would decline treatment. Also declines vaccines. Has hx of numerous intolerances with meds.   She is s/p fall in Jan 2021 and fracture to her hip, continues to use walker, 1 step at home, can manage with handrail, completed PT. Has handicap shower installed with rail. She is on gabapentin 100mg  that is helping. Denies falls since.  Completed PT. Was released by [facility] ortho, taking tylenol, also takes flexeril and voltaren.   She is on cymbalta for mood and chronic pain and has noted some improvement. Has weaned off Xanax She reports trazodone made her more irritated/kept her awake.   She has cirrhosis due to NASH, has seen GI Dr. Adela Lank, EGD 01/2017, no varices. Denies alcohol intake.   Continues to smoke, reports has reduced from 2-2.5 to 1 ppd.. Last CT chest 07/23/2020 showed small nodules, aortic atherosclerosis, emphysematous changes. She does have dyspnea on exertion  BMI is There is no height or weight on file to calculate BMI., she has been working on diet, keeping a close log, admits minimal exercise due to pain. She admits no water, drinks mostly coke zero with lots of ice.  Wt Readings from Last 3 Encounters:  03/02/23 141 lb 12.8  oz (64.3 kg)  01/27/23 135 lb 12.8 oz (61.6 kg)  01/22/23 136 lb 9.6 oz (62 kg)   Had cath 2019 showed Prox RCA to Mid RCA lesion is 60% stenosed. FFR negative- following with Dr. Herbie Baltimore. Perfusion study 06/2020 was low risk.  She is having increased swelling of lower extremities bilaterally, fatigue and dyspnea on exertion. Has not been doing ACE wraps as prescribed  Her blood pressure has been controlled at home, today their BP is   . She is currently on Nadolol 20 mg QD, lisinopril 2.5 mg QD BP Readings from Last 3 Encounters:  03/02/23 138/72  01/27/23 (!) 194/76  01/22/23 (!) 144/74  She does not workout. She denies shortness of breath, dizziness.    She has aortic atherosclerosis per CT 12/2016 and several since.  She is not on cholesterol medication alleging several intolerances (most recently rosuvastatin (diarrhea) and denies myalgias. She has failed welchol and colespitaol in the Her cholesterol is not at goal. The cholesterol last visit was:   Lab Results  Component Value Date   CHOL 243 (H) 01/27/2023   HDL 56 01/27/2023   LDLCALC 155 (H) 01/27/2023   TRIG 181 (H) 01/27/2023   CHOLHDL 4.3 01/27/2023   She has had diabetes for 20 years. She has not been working on diet and exercise for diabetes, and denies hyperglycemia, hypoglycemia , nausea, paresthesia of the feet, polydipsia, polyuria, visual disturbances,  vomiting and weight loss. She is prescribed glimepiride 4 mg BID, lantus 23 units PM Reports fasting BS this AM was 103 Lab Results  Component Value Date   HGBA1C 6.4 (H) 01/27/2023   Last GFR: Lab Results  Component Value Date   GFRNONAA >60 06/09/2022   Patient is on Vitamin D supplement, taking 1000 units three days a week, GI upset with higher doses.   Lab Results  Component Value Date   VD25OH 7 (L) 07/02/2022       Medication Review: Current Outpatient Medications on File Prior to Visit  Medication Sig Dispense Refill   acetaminophen (TYLENOL) 500  MG tablet Take 500 mg by mouth as directed. Take 2 tablets three times ad ay as needed     aspirin EC 81 MG tablet Take 81 mg by mouth daily.     divalproex (DEPAKOTE) 250 MG DR tablet Take 1 tablet 3 x /day for Hereditary tremor or "Shaking Palsy" 270 tablet 1   DULoxetine (CYMBALTA) 20 MG capsule Take  1 capsule  Daily  for Chronic Pain 90 capsule 3   Finerenone (KERENDIA) 10 MG TABS Take 1 tablet (10 mg total) by mouth daily. 30 tablet 2   furosemide (LASIX) 40 MG tablet Furosemide (Lasix) 40 mg twice daily for 3 days, then resume 40 mg daily. May take an additional 40 mg daily if needed for weight gain of 3 lb overnight or 5 lb in 1 week or shortness of breath. 180 tablet 3   gabapentin (NEURONTIN) 100 MG capsule Take 1 capsule (100 mg total) by mouth 3 (three) times daily. 270 capsule 0   glimepiride (AMARYL) 4 MG tablet TAKE ONE TABLET BY MOUTH TWICE DAILY with a meal FOR DIABETES 180 tablet 0   hydrOXYzine (ATARAX) 25 MG tablet Take 1 tablet by mouth 3 times daily for Nerves /Anxiety or 1 to 2 tablets at Bedtime as needed for Sleep 270 tablet 0   insulin glargine (LANTUS SOLOSTAR) 100 UNIT/ML Solostar Pen Inject 23 Units into the skin at bedtime. 15 mL 1   Insulin Pen Needle (CAREFINE PEN NEEDLES) 32G X 4 MM MISC Use 1x a day 100 each 3   ipratropium-albuterol (DUONEB) 0.5-2.5 (3) MG/3ML SOLN Take 3 mLs by nebulization every 4 (four) hours as needed. Max:6 doses per day 540 mL 3   nadolol (CORGARD) 20 MG tablet Take 1 tablet Daily for BP & Hereditary Familial Essential Tremor 90 tablet 3   olmesartan (BENICAR) 40 MG tablet Take 1 tablet (40 mg total) by mouth daily. 30 tablet 11   traZODone (DESYREL) 100 MG tablet TAKE 1/2 TO 1 TABLET BY MOUTH AT BEDTIME AS NEEDED FOR SLEEP 90 tablet 2   No current facility-administered medications on file prior to visit.    Allergies  Allergen Reactions   Actos [Pioglitazone] Other (See Comments)    Patient stated that it elevated her BGL   Glyburide  Other (See Comments)    Patient stated that it elevated her BGL   Metformin And Related     "metformin caused mini-strokes" and ELEVATED BS and also GLYBURIDE, ONGLYZA, ACTOS, INVOKANA caused elevated BS   Onglyza [Saxagliptin] Other (See Comments)    Patient stated that it elevated her BGL   Rosuvastatin Diarrhea   Insulins Rash   Janumet [Sitagliptin-Metformin Hcl] Rash and Other (See Comments)    Shakes/ rash   Penicillins Rash and Other (See Comments)   Spironolactone Rash    Current Problems (verified) Patient Active Problem  List   Diagnosis Date Noted   Rash and other nonspecific skin eruption 02/12/2021   CKD stage 2 due to type 2 diabetes mellitus (HCC) 02/11/2021   Overweight (BMI 25.0-29.9) 10/11/2020   Chest pain with low risk for cardiac etiology 06/24/2020   Type 2 diabetes mellitus with circulatory disorder (HCC) 11/24/2018   Chronic hyponatremia 03/18/2018   Coronary artery disease, non-occlusive: RCA 60%, FFR Negative. 02/16/2018   Cognitive dysfunction 03/27/2017   Liver cirrhosis secondary to NASH (HCC) 11/23/2016   Osteoporosis 05/27/2016   Atherosclerosis of abdominal aorta (HCC) - per CT 12/2016 04/30/2016   COPD (chronic obstructive pulmonary disease) (HCC) 04/30/2015   Tobacco use disorder 04/08/2015   Poor compliance with medication 11/05/2014   Vitamin D deficiency    Anxiety    Essential hypertension    Hyperlipidemia associated with type 2 diabetes mellitus (HCC)     Screening Tests Immunization History  Administered Date(s) Administered   Hep A / Hep B 01/07/2017, 02/08/2017, 07/09/2017   Td 06/26/2001   Tdap 04/30/2015   Preventative care: Last colonoscopy: 12/2016, polyps, Dr. Adela Lank, 3 year follow up due , however she adamantly declines, understands risks of progresion to cancer EGD: 01/2017 gastritis, no varices  Last mammogram: 05/25/2016 OVERDUE - declines, would not pursue treatment if positive  Last pap smear/pelvic exam: remote,  declines  DEXA: 04/2016 - R fem T -3.2, declines follow up at this time   MRI brain 2016 CT chest 07/23/2020 - order follow up at [facility]  Prior vaccinations: DECLINES ALL VACCINES TD or Tdap: 2016   Names of Other Physician/Practitioners you currently use: 1. [facility] here for primary care 2. none, eye doctor, encouraged to see eye doctor - she states will call Dr. Dan Humphreys in [place] for diabetes eye exam  3.  Dentures dentist  Patient Care Team: Lucky Cowboy, MD as PCP - General (Internal Medicine) Marykay Lex, MD as PCP - Cardiology (Cardiology) Kinsinger, De Blanch, MD as Consulting Physician (General Surgery) Carlus Pavlov, MD as Consulting Physician (Internal Medicine) Armbruster, Willaim Rayas, MD as Consulting Physician (Gastroenterology) Lajoyce Corners, [facility] (Inactive) as Pharmacist (Pharmacist)  SURGICAL HISTORY She  has a past surgical history that includes Vaginal hysterectomy (age 44); Bilateral salpingoophorectomy (1997); Knee arthroscopy (Right, 11/2014); Cholecystectomy (N/A, 09/30/2016); LEFT HEART CATH AND CORONARY ANGIOGRAPHY (N/A, 04/12/2018); CORONARY PRESSURE/FFR STUDY (N/A, 04/12/2018); transthoracic echocardiogram (02/2018); NM MYOVIEW LTD (07/03/2020); and ORIF hip fracture (Right, 2021). FAMILY HISTORY Her family history includes Breast cancer (age of onset: 46) in her sister; CAD in her maternal aunt, maternal uncle, and sister; CAD (age of onset: 23) in her mother; Cancer in her father; Coronary artery disease (age of onset: 22) in her brother; Diabetes in her brother, father, and mother; Heart attack in her maternal grandmother; Heart attack (age of onset: 58) in her sister; Hyperlipidemia in her brother, sister, and sister; Hypertension in her brother, father, and sister; Kidney Stones in her son; Stomach cancer in her mother; Stroke in her mother.  SOCIAL HISTORY She  reports that she has been smoking cigarettes. She  started smoking about 55 years ago. She has a 111.9 pack-year smoking history. She has never used smokeless tobacco. She reports that she does not drink alcohol and does not use drugs.   Review of Systems  Constitutional:  Positive for malaise/fatigue. Negative for weight loss.  HENT:  Negative for hearing loss and tinnitus.   Eyes:  Negative for blurred vision and double vision.  Respiratory:  Positive for shortness of breath (on exertion) and wheezing (occasional from smoking). Negative for cough and sputum production.   Cardiovascular:  Positive for leg swelling. Negative for chest pain, palpitations, orthopnea, claudication and PND.  Gastrointestinal:  Negative for abdominal pain, blood in stool, constipation, diarrhea, heartburn, melena, nausea and vomiting.  Genitourinary: Negative.   Musculoskeletal:  Positive for joint pain (chronic hip s/p fracture). Negative for falls and myalgias.  Skin:  Negative for rash.  Neurological:  Negative for dizziness, tingling, sensory change, weakness and headaches.  Endo/Heme/Allergies:  Negative for polydipsia.  Psychiatric/Behavioral: Negative.  Negative for depression, memory loss, substance abuse and suicidal ideas. The patient is not nervous/anxious and does not have insomnia.   All other systems reviewed and are negative.    Objective:     There were no vitals filed for this visit.  There is no height or weight on file to calculate BMI.  General appearance: alert, no distress, WD/WN, female HEENT: normocephalic, sclerae anicteric, TMs pearly, nares patent, no discharge or erythema, pharynx normal Oral cavity: MMM, no lesions Neck: supple, no lymphadenopathy, no thyromegaly, no masses Heart: RRR, normal S1, S2, murmurs Lungs: CTA bilaterally, no wheezes, rhonchi, or rales Abdomen: +bs, soft, obese abdomen, non tender, non distended, no palpable masses Musculoskeletal: no swelling, no obvious deformity, slow antalgic gait with walker.   Extremities: 2+ pitting edema bil lower legs,and lower arms/hands,red discoloration, dry flaky skin Pulses: 1+ lower extremities,  Neurological: alert, oriented x 3, CN2-12 intact, strength normal upper extremities/ right lower extremity and decreased in lower left extremity, sensation normal to monofilament in bil feet,  no cerebellar signs, gait slow antalgic with walker.  Psychiatric: normal affect, behavior normal, pleasant  Skin: Dry , flaky, redness of left knee has resolved Breasts: declines GU: defer, no concerns  EKG:  NSR, no ST changes AAA: Defer to cardiology  Raynelle Dick, NP   07/05/2023

## 2023-07-06 ENCOUNTER — Encounter: Payer: PPO | Admitting: Nurse Practitioner

## 2023-07-06 DIAGNOSIS — E871 Hypo-osmolality and hyponatremia: Secondary | ICD-10-CM

## 2023-07-06 DIAGNOSIS — I700 Atherosclerosis of aorta: Secondary | ICD-10-CM

## 2023-07-06 DIAGNOSIS — Z0001 Encounter for general adult medical examination with abnormal findings: Secondary | ICD-10-CM

## 2023-07-06 DIAGNOSIS — M818 Other osteoporosis without current pathological fracture: Secondary | ICD-10-CM

## 2023-07-06 DIAGNOSIS — E559 Vitamin D deficiency, unspecified: Secondary | ICD-10-CM

## 2023-07-06 DIAGNOSIS — E1122 Type 2 diabetes mellitus with diabetic chronic kidney disease: Secondary | ICD-10-CM

## 2023-07-06 DIAGNOSIS — Z794 Long term (current) use of insulin: Secondary | ICD-10-CM

## 2023-07-06 DIAGNOSIS — Z79899 Other long term (current) drug therapy: Secondary | ICD-10-CM

## 2023-07-06 DIAGNOSIS — F17200 Nicotine dependence, unspecified, uncomplicated: Secondary | ICD-10-CM

## 2023-07-06 DIAGNOSIS — I5032 Chronic diastolic (congestive) heart failure: Secondary | ICD-10-CM

## 2023-07-06 DIAGNOSIS — E1169 Type 2 diabetes mellitus with other specified complication: Secondary | ICD-10-CM

## 2023-07-06 DIAGNOSIS — R809 Proteinuria, unspecified: Secondary | ICD-10-CM

## 2023-07-06 DIAGNOSIS — Z136 Encounter for screening for cardiovascular disorders: Secondary | ICD-10-CM

## 2023-07-06 DIAGNOSIS — R600 Localized edema: Secondary | ICD-10-CM

## 2023-07-06 DIAGNOSIS — F419 Anxiety disorder, unspecified: Secondary | ICD-10-CM

## 2023-07-06 DIAGNOSIS — I10 Essential (primary) hypertension: Secondary | ICD-10-CM

## 2023-07-06 DIAGNOSIS — K7460 Unspecified cirrhosis of liver: Secondary | ICD-10-CM

## 2023-07-06 DIAGNOSIS — Z1389 Encounter for screening for other disorder: Secondary | ICD-10-CM

## 2023-07-06 DIAGNOSIS — E1140 Type 2 diabetes mellitus with diabetic neuropathy, unspecified: Secondary | ICD-10-CM

## 2023-07-06 DIAGNOSIS — Z1329 Encounter for screening for other suspected endocrine disorder: Secondary | ICD-10-CM

## 2023-07-06 DIAGNOSIS — E1159 Type 2 diabetes mellitus with other circulatory complications: Secondary | ICD-10-CM

## 2023-07-06 DIAGNOSIS — J449 Chronic obstructive pulmonary disease, unspecified: Secondary | ICD-10-CM

## 2023-07-06 NOTE — Progress Notes (Unsigned)
CPE  Assessment:   Sara Lamb was seen today for CPE  Diagnoses and all orders for this visit:  Encounter for Annual Physical Exam with abnormal findings Due annually  Health Maintenance reviewed Healthy lifestyle reviewed and goals set  Declines all vaccines, mammogram, colon cancer screening, DEXA follow up despite discussion of risks. She is requesting minimal interventions. She is agreeable for low dose CT scan for lung cancer screening.   Atherosclerosis of abdominal aorta (HCC) - per CT 12/2016 Control blood pressure, cholesterol, glucose, increase exercise.  STOP SMOKING Refer back to cardiology for evaluation - EKG   CAD STOP SMOKING Continue medications, denies angina Follows with Cardiology, Dr Herbie Baltimore. Hasn't seen for 18 months Control blood pressure, cholesterol, glucose, increase exercise.  Pitting edema of lower legs  Essential hypertension Monitor blood pressure at home; call if consistently over 130/80 Continue DASH diet.   Reminder to go to the ER if any CP, SOB, nausea, dizziness, severe HA, changes vision/speech, left arm numbness and tingling and jaw pain.  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 Schedule diabetes eye exam  - plan Foot exam completed -     Hemoglobin A1c -     Lipid panel  Liver cirrhosis secondary to NASH (HCC) Declines GI follow up at this time; working on weight loss, low processed carbohydrate diet for NASH management -     COMPLETE METABOLIC PANEL WITH GFR  Chronic obstructive pulmonary disease, unspecified COPD type (HCC) Denies sx; STOP smoking; denies sx; monitor   Hyperlipidemia associated with type 2 diabetes mellitus (HCC) Titrate as tolerated for LDL goal <70 Several agents with reported SE/intolerances, has not tried other statins - low dose  pravastatin discussed, she is receptive, sent in to try;  Discussed dietary and exercise modifications. -     Lipid panel  Osteoporosis, unspecified osteoporosis type, unspecified pathological fracture presence Due for DEXA scan -patient declines at this time  Discussed Calcium and Vit D supplementation Weight bearing exercise encouraged STOP SMOKING  Tobacco use disorder Discussed smoking cessation.  Reports she is smoking 2 packs a day.  Has been using gum to help.  Reports she has tried chantix, not ready to quit. Encouraged to taper down.  Will continue to assess readiness. -continue low dose annual CT screening - ordered today, last was 2021  Chronic hyponatremia Monitor;  -     COMPLETE METABOLIC PANEL WITH GFR  Anxiety Improved with Cymbalta; reviewed goal to avoid daily use of xanax, continue to taper, declines alternative med option for sleep today Close follow up 3 months Stress management techniques discussed, increase water, good sleep hygiene discussed, increase exercise, and increase veggies.    Vitamin D deficiency -     VITAMIN D 25 Hydroxy (Vit-D Deficiency, Fractures)  Poor compliance with medication Discussed this at length with patient.  Denies current barriers  Localized edema Begin Furosemide 40 mg 1 tab daily Wrap legs in Ace wraps firmly at beginning of each day and take off at bedtime Follow up with Dr. Herbie Baltimore office tomorrow Go to the ER if any chest pain, shortness of breath, nausea, dizziness, severe HA, changes vision/speech, legs start to weep   Medication Management - CBC. TSH. Magnesium   Over 40 minutes of exam, counseling, chart review and critical decision making was performed Future Appointments  Date Time Provider Department Center  07/07/2023  3:00 PM Raynelle Dick, NP [facility] None  07/10/2024  3:00 PM Raynelle Dick, NP [facility] None     Subjective:  Sara Lamb is a 72 y.o. MWF female who presents for CPE.  She has Vitamin D deficiency; Anxiety; Essential hypertension; Hyperlipidemia associated with type 2 diabetes mellitus (HCC); Poor compliance with medication; Tobacco use disorder; COPD (chronic obstructive pulmonary disease) (HCC); Atherosclerosis of abdominal aorta (HCC) - per CT 12/2016; Osteoporosis; Liver cirrhosis secondary to NASH (HCC); Cognitive dysfunction; Coronary artery disease, non-occlusive: RCA 60%, FFR Negative.; Chronic hyponatremia; Type 2 diabetes mellitus with circulatory disorder (HCC); Chest pain with low risk for cardiac etiology; Overweight (BMI 25.0-29.9); CKD stage 2 due to type 2 diabetes mellitus (HCC); and Rash and other nonspecific skin eruption on their problem list.  She is married, 1 son. She declines most cancer screenings, would decline treatment. Also declines vaccines. Has hx of numerous intolerances with meds.   She is s/p fall in Jan 2021 and fracture to her hip, continues to use walker, 1 step at home, can manage with handrail, completed PT. Has handicap shower installed with rail. She is on gabapentin 100mg  that is helping. Denies falls since.  Completed PT. Was released by [facility] ortho, taking tylenol, also takes flexeril and voltaren.   She is on cymbalta for mood and chronic pain and has noted some improvement. Has weaned off Xanax She reports trazodone made her more irritated/kept her awake.   She has cirrhosis due to NASH, has seen GI Dr. Adela Lank, EGD 01/2017, no varices. Denies alcohol intake.   Continues to smoke, reports has reduced from 2-2.5 to 1 ppd.. Last CT chest 07/23/2020 showed small nodules, aortic atherosclerosis, emphysematous changes. She does have dyspnea on exertion  BMI is There is no height or weight on file to calculate BMI., she has been working on diet, keeping a close log, admits minimal exercise due to pain. She admits no water, drinks mostly coke zero with lots of ice.  Wt Readings from Last 3 Encounters:  03/02/23 141 lb 12.8  oz (64.3 kg)  01/27/23 135 lb 12.8 oz (61.6 kg)  01/22/23 136 lb 9.6 oz (62 kg)   Had cath 2019 showed Prox RCA to Mid RCA lesion is 60% stenosed. FFR negative- following with Dr. Herbie Baltimore. Perfusion study 06/2020 was low risk.  She is having increased swelling of lower extremities bilaterally, fatigue and dyspnea on exertion. Has not been doing ACE wraps as prescribed  Her blood pressure has been controlled at home, today their BP is   . She is currently on Nadolol 20 mg QD, lisinopril 2.5 mg QD BP Readings from Last 3 Encounters:  03/02/23 138/72  01/27/23 (!) 194/76  01/22/23 (!) 144/74  She does not workout. She denies shortness of breath, dizziness.    She has aortic atherosclerosis per CT 12/2016 and several since.  She is not on cholesterol medication alleging several intolerances (most recently rosuvastatin (diarrhea) and denies myalgias. She has failed welchol and colespitaol in the Her cholesterol is not at goal. The cholesterol last visit was:   Lab Results  Component Value Date   CHOL 243 (H) 01/27/2023   HDL 56 01/27/2023   LDLCALC 155 (H) 01/27/2023   TRIG 181 (H) 01/27/2023   CHOLHDL 4.3 01/27/2023   She has had diabetes for 20 years. She has not been working on diet and exercise for diabetes, and denies hyperglycemia, hypoglycemia , nausea, paresthesia of the feet, polydipsia, polyuria, visual disturbances,  vomiting and weight loss. She is prescribed glimepiride 4 mg BID, lantus 23 units PM Reports fasting BS this AM was 103 Lab Results  Component Value Date   HGBA1C 6.4 (H) 01/27/2023   Last GFR: Lab Results  Component Value Date   GFRNONAA >60 06/09/2022   Patient is on Vitamin D supplement, taking 1000 units three days a week, GI upset with higher doses.   Lab Results  Component Value Date   VD25OH 7 (L) 07/02/2022       Medication Review: Current Outpatient Medications on File Prior to Visit  Medication Sig Dispense Refill   acetaminophen (TYLENOL) 500  MG tablet Take 500 mg by mouth as directed. Take 2 tablets three times ad ay as needed     aspirin EC 81 MG tablet Take 81 mg by mouth daily.     divalproex (DEPAKOTE) 250 MG DR tablet Take 1 tablet 3 x /day for Hereditary tremor or "Shaking Palsy" 270 tablet 1   DULoxetine (CYMBALTA) 20 MG capsule Take  1 capsule  Daily  for Chronic Pain 90 capsule 3   Finerenone (KERENDIA) 10 MG TABS Take 1 tablet (10 mg total) by mouth daily. 30 tablet 2   furosemide (LASIX) 40 MG tablet Furosemide (Lasix) 40 mg twice daily for 3 days, then resume 40 mg daily. May take an additional 40 mg daily if needed for weight gain of 3 lb overnight or 5 lb in 1 week or shortness of breath. 180 tablet 3   gabapentin (NEURONTIN) 100 MG capsule Take 1 capsule (100 mg total) by mouth 3 (three) times daily. 270 capsule 0   glimepiride (AMARYL) 4 MG tablet TAKE ONE TABLET BY MOUTH TWICE DAILY with a meal FOR DIABETES 180 tablet 0   hydrOXYzine (ATARAX) 25 MG tablet Take 1 tablet by mouth 3 times daily for Nerves /Anxiety or 1 to 2 tablets at Bedtime as needed for Sleep 270 tablet 0   insulin glargine (LANTUS SOLOSTAR) 100 UNIT/ML Solostar Pen Inject 23 Units into the skin at bedtime. 15 mL 1   Insulin Pen Needle (CAREFINE PEN NEEDLES) 32G X 4 MM MISC Use 1x a day 100 each 3   ipratropium-albuterol (DUONEB) 0.5-2.5 (3) MG/3ML SOLN Take 3 mLs by nebulization every 4 (four) hours as needed. Max:6 doses per day 540 mL 3   nadolol (CORGARD) 20 MG tablet Take 1 tablet Daily for BP & Hereditary Familial Essential Tremor 90 tablet 3   olmesartan (BENICAR) 40 MG tablet Take 1 tablet (40 mg total) by mouth daily. 30 tablet 11   traZODone (DESYREL) 100 MG tablet TAKE 1/2 TO 1 TABLET BY MOUTH AT BEDTIME AS NEEDED FOR SLEEP 90 tablet 2   No current facility-administered medications on file prior to visit.    Allergies  Allergen Reactions   Actos [Pioglitazone] Other (See Comments)    Patient stated that it elevated her BGL   Glyburide  Other (See Comments)    Patient stated that it elevated her BGL   Metformin And Related     "metformin caused mini-strokes" and ELEVATED BS and also GLYBURIDE, ONGLYZA, ACTOS, INVOKANA caused elevated BS   Onglyza [Saxagliptin] Other (See Comments)    Patient stated that it elevated her BGL   Rosuvastatin Diarrhea   Insulins Rash   Janumet [Sitagliptin-Metformin Hcl] Rash and Other (See Comments)    Shakes/ rash   Penicillins Rash and Other (See Comments)   Spironolactone Rash    Current Problems (verified) Patient Active Problem  List   Diagnosis Date Noted   Rash and other nonspecific skin eruption 02/12/2021   CKD stage 2 due to type 2 diabetes mellitus (HCC) 02/11/2021   Overweight (BMI 25.0-29.9) 10/11/2020   Chest pain with low risk for cardiac etiology 06/24/2020   Type 2 diabetes mellitus with circulatory disorder (HCC) 11/24/2018   Chronic hyponatremia 03/18/2018   Coronary artery disease, non-occlusive: RCA 60%, FFR Negative. 02/16/2018   Cognitive dysfunction 03/27/2017   Liver cirrhosis secondary to NASH (HCC) 11/23/2016   Osteoporosis 05/27/2016   Atherosclerosis of abdominal aorta (HCC) - per CT 12/2016 04/30/2016   COPD (chronic obstructive pulmonary disease) (HCC) 04/30/2015   Tobacco use disorder 04/08/2015   Poor compliance with medication 11/05/2014   Vitamin D deficiency    Anxiety    Essential hypertension    Hyperlipidemia associated with type 2 diabetes mellitus (HCC)     Screening Tests Immunization History  Administered Date(s) Administered   Hep A / Hep B 01/07/2017, 02/08/2017, 07/09/2017   Td 06/26/2001   Tdap 04/30/2015   Preventative care: Last colonoscopy: 12/2016, polyps, Dr. Adela Lank, 3 year follow up due , however she adamantly declines, understands risks of progresion to cancer EGD: 01/2017 gastritis, no varices  Last mammogram: 05/25/2016 OVERDUE - declines, would not pursue treatment if positive  Last pap smear/pelvic exam: remote,  declines  DEXA: 04/2016 - R fem T -3.2, declines follow up at this time   MRI brain 2016 CT chest 07/23/2020 - order follow up at [facility]  Prior vaccinations: DECLINES ALL VACCINES TD or Tdap: 2016   Names of Other Physician/Practitioners you currently use: 1. [facility] here for primary care 2. none, eye doctor, encouraged to see eye doctor - she states will call Dr. Dan Humphreys in [place] for diabetes eye exam  3.  Dentures dentist  Patient Care Team: Lucky Cowboy, MD as PCP - General (Internal Medicine) Marykay Lex, MD as PCP - Cardiology (Cardiology) Kinsinger, De Blanch, MD as Consulting Physician (General Surgery) Carlus Pavlov, MD as Consulting Physician (Internal Medicine) Armbruster, Willaim Rayas, MD as Consulting Physician (Gastroenterology) Lajoyce Corners, [facility] (Inactive) as Pharmacist (Pharmacist)  SURGICAL HISTORY She  has a past surgical history that includes Vaginal hysterectomy (age 64); Bilateral salpingoophorectomy (1997); Knee arthroscopy (Right, 11/2014); Cholecystectomy (N/A, 09/30/2016); LEFT HEART CATH AND CORONARY ANGIOGRAPHY (N/A, 04/12/2018); CORONARY PRESSURE/FFR STUDY (N/A, 04/12/2018); transthoracic echocardiogram (02/2018); NM MYOVIEW LTD (07/03/2020); and ORIF hip fracture (Right, 2021). FAMILY HISTORY Her family history includes Breast cancer (age of onset: 47) in her sister; CAD in her maternal aunt, maternal uncle, and sister; CAD (age of onset: 57) in her mother; Cancer in her father; Coronary artery disease (age of onset: 79) in her brother; Diabetes in her brother, father, and mother; Heart attack in her maternal grandmother; Heart attack (age of onset: 69) in her sister; Hyperlipidemia in her brother, sister, and sister; Hypertension in her brother, father, and sister; Kidney Stones in her son; Stomach cancer in her mother; Stroke in her mother.  SOCIAL HISTORY She  reports that she has been smoking cigarettes. She  started smoking about 55 years ago. She has a 111.9 pack-year smoking history. She has never used smokeless tobacco. She reports that she does not drink alcohol and does not use drugs.   Review of Systems  Constitutional:  Positive for malaise/fatigue. Negative for weight loss.  HENT:  Negative for hearing loss and tinnitus.   Eyes:  Negative for blurred vision and double vision.  Respiratory:  Positive for shortness of breath (on exertion) and wheezing (occasional from smoking). Negative for cough and sputum production.   Cardiovascular:  Positive for leg swelling. Negative for chest pain, palpitations, orthopnea, claudication and PND.  Gastrointestinal:  Negative for abdominal pain, blood in stool, constipation, diarrhea, heartburn, melena, nausea and vomiting.  Genitourinary: Negative.   Musculoskeletal:  Positive for joint pain (chronic hip s/p fracture). Negative for falls and myalgias.  Skin:  Negative for rash.  Neurological:  Negative for dizziness, tingling, sensory change, weakness and headaches.  Endo/Heme/Allergies:  Negative for polydipsia.  Psychiatric/Behavioral: Negative.  Negative for depression, memory loss, substance abuse and suicidal ideas. The patient is not nervous/anxious and does not have insomnia.   All other systems reviewed and are negative.    Objective:     There were no vitals filed for this visit.  There is no height or weight on file to calculate BMI.  General appearance: alert, no distress, WD/WN, female HEENT: normocephalic, sclerae anicteric, TMs pearly, nares patent, no discharge or erythema, pharynx normal Oral cavity: MMM, no lesions Neck: supple, no lymphadenopathy, no thyromegaly, no masses Heart: RRR, normal S1, S2, murmurs Lungs: CTA bilaterally, no wheezes, rhonchi, or rales Abdomen: +bs, soft, obese abdomen, non tender, non distended, no palpable masses Musculoskeletal: no swelling, no obvious deformity, slow antalgic gait with walker.   Extremities: 2+ pitting edema bil lower legs,and lower arms/hands,red discoloration, dry flaky skin Pulses: 1+ lower extremities,  Neurological: alert, oriented x 3, CN2-12 intact, strength normal upper extremities/ right lower extremity and decreased in lower left extremity, sensation normal to monofilament in bil feet,  no cerebellar signs, gait slow antalgic with walker.  Psychiatric: normal affect, behavior normal, pleasant  Skin: Dry , flaky, redness of left knee has resolved Breasts: declines GU: defer, no concerns  EKG:  NSR, no ST changes AAA: Defer to cardiology  Raynelle Dick, NP   07/06/2023

## 2023-07-07 ENCOUNTER — Encounter: Payer: Self-pay | Admitting: Nurse Practitioner

## 2023-07-07 ENCOUNTER — Ambulatory Visit (INDEPENDENT_AMBULATORY_CARE_PROVIDER_SITE_OTHER): Payer: PPO | Admitting: Nurse Practitioner

## 2023-07-07 VITALS — BP 180/68 | HR 94 | Temp 97.5°F | Ht <= 58 in | Wt 139.0 lb

## 2023-07-07 DIAGNOSIS — E871 Hypo-osmolality and hyponatremia: Secondary | ICD-10-CM

## 2023-07-07 DIAGNOSIS — E1129 Type 2 diabetes mellitus with other diabetic kidney complication: Secondary | ICD-10-CM

## 2023-07-07 DIAGNOSIS — E1122 Type 2 diabetes mellitus with diabetic chronic kidney disease: Secondary | ICD-10-CM

## 2023-07-07 DIAGNOSIS — E1159 Type 2 diabetes mellitus with other circulatory complications: Secondary | ICD-10-CM

## 2023-07-07 DIAGNOSIS — M791 Myalgia, unspecified site: Secondary | ICD-10-CM

## 2023-07-07 DIAGNOSIS — K7460 Unspecified cirrhosis of liver: Secondary | ICD-10-CM

## 2023-07-07 DIAGNOSIS — E559 Vitamin D deficiency, unspecified: Secondary | ICD-10-CM

## 2023-07-07 DIAGNOSIS — I10 Essential (primary) hypertension: Secondary | ICD-10-CM | POA: Diagnosis not present

## 2023-07-07 DIAGNOSIS — Z79899 Other long term (current) drug therapy: Secondary | ICD-10-CM | POA: Diagnosis not present

## 2023-07-07 DIAGNOSIS — R9431 Abnormal electrocardiogram [ECG] [EKG]: Secondary | ICD-10-CM

## 2023-07-07 DIAGNOSIS — Z0001 Encounter for general adult medical examination with abnormal findings: Secondary | ICD-10-CM

## 2023-07-07 DIAGNOSIS — K7581 Nonalcoholic steatohepatitis (NASH): Secondary | ICD-10-CM | POA: Diagnosis not present

## 2023-07-07 DIAGNOSIS — F17200 Nicotine dependence, unspecified, uncomplicated: Secondary | ICD-10-CM

## 2023-07-07 DIAGNOSIS — G250 Essential tremor: Secondary | ICD-10-CM

## 2023-07-07 DIAGNOSIS — Z1329 Encounter for screening for other suspected endocrine disorder: Secondary | ICD-10-CM

## 2023-07-07 DIAGNOSIS — Z91148 Patient's other noncompliance with medication regimen for other reason: Secondary | ICD-10-CM

## 2023-07-07 DIAGNOSIS — E1169 Type 2 diabetes mellitus with other specified complication: Secondary | ICD-10-CM

## 2023-07-07 DIAGNOSIS — Z0000 Encounter for general adult medical examination without abnormal findings: Secondary | ICD-10-CM | POA: Diagnosis not present

## 2023-07-07 DIAGNOSIS — J449 Chronic obstructive pulmonary disease, unspecified: Secondary | ICD-10-CM

## 2023-07-07 DIAGNOSIS — Z136 Encounter for screening for cardiovascular disorders: Secondary | ICD-10-CM

## 2023-07-07 DIAGNOSIS — E785 Hyperlipidemia, unspecified: Secondary | ICD-10-CM | POA: Diagnosis not present

## 2023-07-07 DIAGNOSIS — I2510 Atherosclerotic heart disease of native coronary artery without angina pectoris: Secondary | ICD-10-CM

## 2023-07-07 DIAGNOSIS — Z794 Long term (current) use of insulin: Secondary | ICD-10-CM

## 2023-07-07 DIAGNOSIS — Z1389 Encounter for screening for other disorder: Secondary | ICD-10-CM

## 2023-07-07 DIAGNOSIS — E1140 Type 2 diabetes mellitus with diabetic neuropathy, unspecified: Secondary | ICD-10-CM

## 2023-07-07 DIAGNOSIS — M818 Other osteoporosis without current pathological fracture: Secondary | ICD-10-CM

## 2023-07-07 DIAGNOSIS — T466X5A Adverse effect of antihyperlipidemic and antiarteriosclerotic drugs, initial encounter: Secondary | ICD-10-CM

## 2023-07-07 DIAGNOSIS — I700 Atherosclerosis of aorta: Secondary | ICD-10-CM | POA: Diagnosis not present

## 2023-07-07 DIAGNOSIS — Z736 Limitation of activities due to disability: Secondary | ICD-10-CM

## 2023-07-07 DIAGNOSIS — G4701 Insomnia due to medical condition: Secondary | ICD-10-CM

## 2023-07-07 DIAGNOSIS — N182 Chronic kidney disease, stage 2 (mild): Secondary | ICD-10-CM | POA: Diagnosis not present

## 2023-07-07 DIAGNOSIS — L299 Pruritus, unspecified: Secondary | ICD-10-CM

## 2023-07-07 DIAGNOSIS — I5032 Chronic diastolic (congestive) heart failure: Secondary | ICD-10-CM

## 2023-07-07 MED ORDER — OLMESARTAN MEDOXOMIL 40 MG PO TABS: 40.0000 mg | ORAL_TABLET | Freq: Every day | ORAL | 11 refills | Status: AC

## 2023-07-07 MED ORDER — TRAZODONE HCL 100 MG PO TABS: ORAL_TABLET | ORAL | 2 refills | Status: AC

## 2023-07-07 MED ORDER — GABAPENTIN 100 MG PO CAPS: 100.0000 mg | ORAL_CAPSULE | Freq: Three times a day (TID) | ORAL | 0 refills | Status: DC

## 2023-07-07 MED ORDER — KERENDIA 20 MG PO TABS: 20.0000 mg | ORAL_TABLET | Freq: Every day | ORAL | 6 refills | Status: DC

## 2023-07-07 MED ORDER — HYDROXYZINE HCL 25 MG PO TABS: ORAL_TABLET | ORAL | 0 refills | Status: DC

## 2023-07-07 MED ORDER — NADOLOL 20 MG PO TABS: ORAL_TABLET | ORAL | 3 refills | Status: AC

## 2023-07-07 NOTE — Patient Instructions (Addendum)
Decrease lantus to 18 mg at bedtime Once I have A1c back may also adjust glimepiride Keep blood sugar log and follow up in 1 month with your log  Diabetes Mellitus and Nutrition, Adult When you have diabetes, or diabetes mellitus, it is very important to have healthy eating habits because your blood sugar (glucose) levels are greatly affected by what you eat and drink. Eating healthy foods in the right amounts, at about the same times every day, can help you: Manage your blood glucose. Lower your risk of heart disease. Improve your blood pressure. Reach or maintain a healthy weight. What can affect my meal plan? Every person with diabetes is different, and each person has different needs for a meal plan. Your health care provider may recommend that you work with a dietitian to make a meal plan that is best for you. Your meal plan may vary depending on factors such as: The calories you need. The medicines you take. Your weight. Your blood glucose, blood pressure, and cholesterol levels. Your activity level. Other health conditions you have, such as heart or kidney disease. How do carbohydrates affect me? Carbohydrates, also called carbs, affect your blood glucose level more than any other type of food. Eating carbs raises the amount of glucose in your blood. It is important to know how many carbs you can safely have in each meal. This is different for every person. Your dietitian can help you calculate how many carbs you should have at each meal and for each snack. How does alcohol affect me? Alcohol can cause a decrease in blood glucose (hypoglycemia), especially if you use insulin or take certain diabetes medicines by mouth. Hypoglycemia can be a life-threatening condition. Symptoms of hypoglycemia, such as sleepiness, dizziness, and confusion, are similar to symptoms of having too much alcohol. Do not drink alcohol if: Your health care provider tells you not to drink. You are pregnant, may  be pregnant, or are planning to become pregnant. If you drink alcohol: Limit how much you have to: 0-1 drink a day for women. 0-2 drinks a day for men. Know how much alcohol is in your drink. In the [place] one drink equals one 12 oz bottle of beer (355 mL), one 5 oz glass of wine (148 mL), or one 1 oz glass of hard liquor (44 mL). Keep yourself hydrated with water, diet soda, or unsweetened iced tea. Keep in mind that regular soda, juice, and other mixers may contain a lot of sugar and must be counted as carbs. What are tips for following this plan?  Reading food labels Start by checking the serving size on the Nutrition Facts label of packaged foods and drinks. The number of calories and the amount of carbs, fats, and other nutrients listed on the label are based on one serving of the item. Many items contain more than one serving per package. Check the total grams (g) of carbs in one serving. Check the number of grams of saturated fats and trans fats in one serving. Choose foods that have a low amount or none of these fats. Check the number of milligrams (mg) of salt (sodium) in one serving. Most people should limit total sodium intake to less than 2,300 mg per day. Always check the nutrition information of foods labeled as "low-fat" or "nonfat." These foods may be higher in added sugar or refined carbs and should be avoided. Talk to your dietitian to identify your daily goals for nutrients listed on the label. Shopping Avoid buying  canned, pre-made, or processed foods. These foods tend to be high in fat, sodium, and added sugar. Shop around the outside edge of the grocery store. This is where you will most often find fresh fruits and vegetables, bulk grains, fresh meats, and fresh dairy products. Cooking Use low-heat cooking methods, such as baking, instead of high-heat cooking methods, such as deep frying. Cook using healthy oils, such as olive, canola, or sunflower oil. Avoid cooking with  butter, cream, or high-fat meats. Meal planning Eat meals and snacks regularly, preferably at the same times every day. Avoid going long periods of time without eating. Eat foods that are high in fiber, such as fresh fruits, vegetables, beans, and whole grains. Eat 4-6 oz (112-168 g) of lean protein each day, such as lean meat, chicken, fish, eggs, or tofu. One ounce (oz) (28 g) of lean protein is equal to: 1 oz (28 g) of meat, chicken, or fish. 1 egg.  cup (62 g) of tofu. Eat some foods each day that contain healthy fats, such as avocado, nuts, seeds, and fish. What foods should I eat? Fruits Berries. Apples. Oranges. Peaches. Apricots. Plums. Grapes. Mangoes. Papayas. Pomegranates. Kiwi. Cherries. Vegetables Leafy greens, including lettuce, spinach, kale, chard, collard greens, mustard greens, and cabbage. Beets. Cauliflower. Broccoli. Carrots. Green beans. Tomatoes. Peppers. Onions. Cucumbers. Brussels sprouts. Grains Whole grains, such as whole-wheat or whole-grain bread, crackers, tortillas, cereal, and pasta. Unsweetened oatmeal. Quinoa. Brown or wild rice. Meats and other proteins Seafood. Poultry without skin. Lean cuts of poultry and beef. Tofu. Nuts. Seeds. Dairy Low-fat or fat-free dairy products such as milk, yogurt, and cheese. The items listed above may not be a complete list of foods and beverages you can eat and drink. Contact a dietitian for more information. What foods should I avoid? Fruits Fruits canned with syrup. Vegetables Canned vegetables. Frozen vegetables with butter or cream sauce. Grains Refined white flour and flour products such as bread, pasta, snack foods, and cereals. Avoid all processed foods. Meats and other proteins Fatty cuts of meat. Poultry with skin. Breaded or fried meats. Processed meat. Avoid saturated fats. Dairy Full-fat yogurt, cheese, or milk. Beverages Sweetened drinks, such as soda or iced tea. The items listed above may not be a  complete list of foods and beverages you should avoid. Contact a dietitian for more information. Questions to ask a health care provider Do I need to meet with a certified diabetes care and education specialist? Do I need to meet with a dietitian? What number can I call if I have questions? When are the best times to check my blood glucose? Where to find more information: American Diabetes Association: diabetes.org Academy of Nutrition and Dietetics: eatright[employer] of Diabetes and Digestive and Kidney Diseases: StageSync.si Association of Diabetes Care & Education Specialists: diabeteseducator.org Summary It is important to have healthy eating habits because your blood sugar (glucose) levels are greatly affected by what you eat and drink. It is important to use alcohol carefully. A healthy meal plan will help you manage your blood glucose and lower your risk of heart disease. Your health care provider may recommend that you work with a dietitian to make a meal plan that is best for you. This information is not intended to replace advice given to you by your health care provider. Make sure you discuss any questions you have with your health care provider. Document Revised: 02/14/2020 Document Reviewed: 02/14/2020 Elsevier Patient Education  2024 [employer].

## 2023-07-08 LAB — CBC WITH DIFFERENTIAL/PLATELET
Absolute Lymphocytes: 2271 {cells}/uL (ref 850–3900)
Absolute Monocytes: 694 {cells}/uL (ref 200–950)
Basophils Absolute: 86 {cells}/uL (ref 0–200)
Basophils Relative: 0.9 %
Eosinophils Absolute: 950 {cells}/uL — ABNORMAL HIGH (ref 15–500)
Eosinophils Relative: 10 %
HCT: 39.4 % (ref 35.0–45.0)
Hemoglobin: 12.7 g/dL (ref 11.7–15.5)
MCH: 30 pg (ref 27.0–33.0)
MCHC: 32.2 g/dL (ref 32.0–36.0)
MCV: 92.9 fL (ref 80.0–100.0)
MPV: 9.8 fL (ref 7.5–12.5)
Monocytes Relative: 7.3 %
Neutro Abs: 5501 {cells}/uL (ref 1500–7800)
Neutrophils Relative %: 57.9 %
Platelets: 224 10*3/uL (ref 140–400)
RBC: 4.24 10*6/uL (ref 3.80–5.10)
RDW: 12.6 % (ref 11.0–15.0)
Total Lymphocyte: 23.9 %
WBC: 9.5 10*3/uL (ref 3.8–10.8)

## 2023-07-08 LAB — URINALYSIS, ROUTINE W REFLEX MICROSCOPIC
Bilirubin Urine: NEGATIVE
Glucose, UA: NEGATIVE
Hyaline Cast: NONE SEEN /[LPF]
Ketones, ur: NEGATIVE
Nitrite: POSITIVE — AB
Specific Gravity, Urine: 1.007 (ref 1.001–1.035)
Squamous Epithelial / HPF: NONE SEEN /[HPF] (ref <=5)
pH: 6.5 (ref 5.0–8.0)

## 2023-07-08 LAB — COMPLETE METABOLIC PANEL WITH GFR
AG Ratio: 1 (calc) (ref 1.0–2.5)
ALT: 10 U/L (ref 6–29)
AST: 18 U/L (ref 10–35)
Albumin: 3.2 g/dL — ABNORMAL LOW (ref 3.6–5.1)
Alkaline phosphatase (APISO): 116 U/L (ref 37–153)
BUN/Creatinine Ratio: 23 (calc) — ABNORMAL HIGH (ref 6–22)
BUN: 23 mg/dL (ref 7–25)
CO2: 30 mmol/L (ref 20–32)
Calcium: 9.1 mg/dL (ref 8.6–10.4)
Chloride: 99 mmol/L (ref 98–110)
Creat: 1.02 mg/dL — ABNORMAL HIGH (ref 0.60–1.00)
Globulin: 3.2 g/dL (ref 1.9–3.7)
Glucose, Bld: 56 mg/dL — ABNORMAL LOW (ref 65–99)
Potassium: 4.7 mmol/L (ref 3.5–5.3)
Sodium: 135 mmol/L (ref 135–146)
Total Bilirubin: 0.3 mg/dL (ref 0.2–1.2)
Total Protein: 6.4 g/dL (ref 6.1–8.1)
eGFR: 58 mL/min/{1.73_m2} — ABNORMAL LOW (ref >=60)

## 2023-07-08 LAB — MICROALBUMIN / CREATININE URINE RATIO
Creatinine, Urine: 16 mg/dL — ABNORMAL LOW (ref 20–275)
Microalb Creat Ratio: 7638 mg/g{creat} — ABNORMAL HIGH (ref <30)
Microalb, Ur: 122.2 mg/dL

## 2023-07-08 LAB — VITAMIN D 25 HYDROXY (VIT D DEFICIENCY, FRACTURES): Vit D, 25-Hydroxy: 6 ng/mL — ABNORMAL LOW (ref 30–100)

## 2023-07-08 LAB — TSH: TSH: 1.09 m[IU]/L (ref 0.40–4.50)

## 2023-07-08 LAB — LIPID PANEL
Cholesterol: 271 mg/dL — ABNORMAL HIGH (ref <200)
HDL: 63 mg/dL (ref >=50)
LDL Cholesterol (Calc): 168 mg/dL — ABNORMAL HIGH
Non-HDL Cholesterol (Calc): 208 mg/dL — ABNORMAL HIGH (ref <130)
Total CHOL/HDL Ratio: 4.3 (calc) (ref <5.0)
Triglycerides: 235 mg/dL — ABNORMAL HIGH (ref <150)

## 2023-07-08 LAB — HEMOGLOBIN A1C W/OUT EAG: Hgb A1c MFr Bld: 7 %{Hb} — ABNORMAL HIGH (ref <5.7)

## 2023-07-08 LAB — MAGNESIUM: Magnesium: 1.9 mg/dL (ref 1.5–2.5)

## 2023-07-09 ENCOUNTER — Other Ambulatory Visit: Payer: Self-pay | Admitting: Nurse Practitioner

## 2023-07-09 DIAGNOSIS — R8290 Unspecified abnormal findings in urine: Secondary | ICD-10-CM

## 2023-07-09 MED ORDER — NITROFURANTOIN MONOHYD MACRO 100 MG PO CAPS: 100.0000 mg | ORAL_CAPSULE | Freq: Two times a day (BID) | ORAL | 0 refills | Status: AC

## 2023-07-12 NOTE — Progress Notes (Unsigned)
Assessment/Plan:   Tremor  -difficult to elucidate exact nature/etiology in part because of giveaway weakness and in part because of tremor inducing medicine  -stop depakote.  It can induce tremor.  Looks like it is being RX for treatment of tremor, however.    -Proceed with DaTscan  -Do not think her gait instability is solely neurologic, nor does the patient.  She states that she has walked with a walker for a few years, primarily because of orthopedic issues.  -If DaTscan negative, we we will likely consider changing over her nadolol to propranolol and increasing the dose, so long as okay with primary care.  We can certainly consider primidone as well.  Subjective:   Sara Lamb was seen today in the movement disorders clinic for neurologic consultation at the request of Raynelle Dick, NP.  The consultation is for the evaluation of tremor.  Outside records that were made available to me were reviewed.   Tremor: Yes.     How long has it been going on? 1 years  At rest or with activation?  both  Fam hx of tremor?  No.  Located where?  Bilateral UE and both hands shake the same  Affected by caffeine:  No.  Affected by alcohol:  doesn't drink any  Affected by stress:  No.  Affected by fatigue:  Yes.    Spills soup if on spoon:  Yes.    Affects ADL's (tying shoes, brushing teeth, etc):  Yes.    Tremor inducing meds:  Yes.  , VPA but pt states that she was put on this to help tremor (chart states this as well)  Tremor improving meds:  records indicate on nadolol for BP and ET  Other Specific Symptoms:  Voice: no change Sleep:   Vivid Dreams:  No.  Acting out dreams:  No. Wet Pillows: No. Postural symptoms:  Yes.   (Thinks its bad from hip and "my right busted knee") - used walker x 2 years  Falls?  Yes.   Bradykinesia symptoms: difficulty getting out of a chair; no shuffle Loss of smell:  No. Loss of taste:  No. Urinary Incontinence:  Yes.   Due to can't get there fast  enough Difficulty Swallowing:  No. Handwriting, micrographia: No. But its not legible b/c of tremor Trouble with ADL's:  Yes.  , husband has to help  Trouble buttoning clothing: Yes.   Depression:  No. Memory changes:  yes - troubles with remembering where she put things or how to make a recipe N/V: occasional nausea Lightheaded:  occasional Diplopia:  No.   Patient had an MRI of the brain in August, 2023 that was unremarkable with the exception of mild small vessel disease.    ALLERGIES:   Allergies  Allergen Reactions   Actos [Pioglitazone] Other (See Comments)    Patient stated that it elevated her BGL   Glyburide Other (See Comments)    Patient stated that it elevated her BGL   Metformin And Related     "metformin caused mini-strokes" and ELEVATED BS and also GLYBURIDE, ONGLYZA, ACTOS, INVOKANA caused elevated BS   Onglyza [Saxagliptin] Other (See Comments)    Patient stated that it elevated her BGL   Rosuvastatin Diarrhea   Insulins Rash   Janumet [Sitagliptin-Metformin Hcl] Rash and Other (See Comments)    Shakes/ rash   Penicillins Rash and Other (See Comments)   Spironolactone Rash    CURRENT MEDICATIONS:  Current Outpatient Medications  Medication Instructions  acetaminophen (TYLENOL) 500 mg, As directed   aspirin EC 81 mg, Daily   divalproex (DEPAKOTE) 250 MG DR tablet Take 1 tablet 3 x /day for Hereditary tremor or "Shaking Palsy"   DULoxetine (CYMBALTA) 20 MG capsule Take  1 capsule  Daily  for Chronic Pain   furosemide (LASIX) 40 MG tablet Furosemide (Lasix) 40 mg twice daily for 3 days, then resume 40 mg daily. May take an additional 40 mg daily if needed for weight gain of 3 lb overnight or 5 lb in 1 week or shortness of breath.   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 HEENT:  Normocephalic, atraumatic.  The mucous membranes are moist. The superficial temporal arteries are without ropiness or tenderness. CV:  RRR Lungs:  CTAB Neck/HEME:  There are no carotid bruits bilaterally.  Neurological examination:  Orientation: The patient is alert and oriented x3.  Cranial nerves: There is good facial symmetry.  Extraocular muscles are intact. The visual fields are full to confrontational testing. The speech is fluent and clear. Soft palate rises symmetrically and there is no tongue deviation. Hearing is intact to conversational tone. Sensation: Sensation is intact to light touch throughout (facial, trunk, extremities). Vibration is intact at the bilateral big toe. There is no extinction with double simultaneous stimulation.  Motor: Strength is 5/5 in the bilateral upper and L lower extremities.  Strength in the RLE is at least 4+-5-/5 with giveway weakness that improves with encouragement.   Shoulder shrug is equal and symmetric.  There is no pronator drift.   Movement examination: Tone: There is ?mild increased tone in  the RUE (vs paratonia) Abnormal movements: there is tremor only when she holds her walker on the R.  No other rest tremor when in the lap, including with distraction procedures.  No postural tremor.  She has some tremor with Archimedes spirals on the right, but only mild.  No significant tremor on the left (just poorly drawn because she is right-handed)  Coordination:  There is  decremation with RAM's vs slowness on the R.  She does better on the left. Gait and Station: The patient pushes off to arise.  She is slow and slightly drags the right leg, but ambulates fairly well with the walker.  Without the walker, she is careful and  a bit wide-based, but no shuffling is noted.   I have reviewed and interpreted the following labs independently   Chemistry      Component Value Date/Time   NA 135 07/07/2023 1602   NA 134 07/15/2022 1531   K 4.7 07/07/2023 1602   CL 99 07/07/2023 1602   CO2 30 07/07/2023 1602   BUN 23 07/07/2023 1602   BUN 16 07/15/2022 1531   CREATININE 1.02 (H) 07/07/2023 1602      Component Value Date/Time   CALCIUM 9.1 07/07/2023 1602   ALKPHOS 110 06/09/2022 1348   AST 18 07/07/2023 1602   ALT 10 07/07/2023 1602   BILITOT 0.3 07/07/2023 1602      Lab Results  Component Value Date   TSH 1.09 07/07/2023   Lab Results  Component Value Date   WBC 9.5 07/07/2023   HGB 12.7 07/07/2023   HCT 39.4 07/07/2023   MCV 92.9 07/07/2023   PLT 224 07/07/2023      Total time spent on today's visit was 60 minutes, including both face-to-face time and nonface-to-face time.  Time included that spent on review of records (prior notes available to me/labs/imaging if pertinent), discussing treatment and goals, answering patient's questions and coordinating care.  Cc:  Lucky Cowboy, MD

## 2023-07-14 ENCOUNTER — Ambulatory Visit: Payer: PPO | Admitting: Neurology

## 2023-07-14 ENCOUNTER — Encounter: Payer: Self-pay | Admitting: Neurology

## 2023-07-14 VITALS — BP 117/77 | Ht <= 58 in | Wt 139.6 lb

## 2023-07-14 DIAGNOSIS — R251 Tremor, unspecified: Secondary | ICD-10-CM | POA: Diagnosis not present

## 2023-07-14 NOTE — Patient Instructions (Signed)

## 2023-07-15 ENCOUNTER — Other Ambulatory Visit: Payer: Self-pay | Admitting: Nurse Practitioner

## 2023-08-05 ENCOUNTER — Encounter (HOSPITAL_COMMUNITY)
Admission: RE | Admit: 2023-08-05 | Discharge: 2023-08-05 | Disposition: A | Discharge status: Home / Self Care | Payer: PPO | Source: Ambulatory Visit | Attending: Neurology | Admitting: Neurology

## 2023-08-05 ENCOUNTER — Ambulatory Visit: Payer: PPO | Admitting: Nurse Practitioner

## 2023-08-05 DIAGNOSIS — R251 Tremor, unspecified: Secondary | ICD-10-CM | POA: Insufficient documentation

## 2023-08-05 MED ORDER — POTASSIUM IODIDE (ANTIDOTE) 130 MG PO TABS
ORAL_TABLET | ORAL | Status: AC
  Filled 2023-08-05: qty 1

## 2023-08-05 MED ORDER — IOFLUPANE I 123 185 MBQ/2.5ML IV SOLN
4.3000 | Freq: Once | INTRAVENOUS | Status: AC | PRN
  Administered 2023-08-05: 4.3 via INTRAVENOUS
  Filled 2023-08-05: qty 5

## 2023-08-18 ENCOUNTER — Other Ambulatory Visit: Payer: Self-pay | Admitting: Nurse Practitioner

## 2023-08-18 DIAGNOSIS — I700 Atherosclerosis of aorta: Secondary | ICD-10-CM

## 2023-08-19 ENCOUNTER — Ambulatory Visit: Payer: PPO | Admitting: Neurology

## 2023-09-02 ENCOUNTER — Other Ambulatory Visit: Payer: Self-pay | Admitting: Internal Medicine

## 2023-09-02 ENCOUNTER — Ambulatory Visit: Payer: PPO | Admitting: Nurse Practitioner

## 2023-09-02 DIAGNOSIS — I700 Atherosclerosis of aorta: Secondary | ICD-10-CM

## 2023-09-02 DIAGNOSIS — G250 Essential tremor: Secondary | ICD-10-CM

## 2023-09-02 DIAGNOSIS — L299 Pruritus, unspecified: Secondary | ICD-10-CM

## 2023-09-02 DIAGNOSIS — E1140 Type 2 diabetes mellitus with diabetic neuropathy, unspecified: Secondary | ICD-10-CM

## 2023-09-02 DIAGNOSIS — Z79899 Other long term (current) drug therapy: Secondary | ICD-10-CM

## 2023-09-14 ENCOUNTER — Telehealth: Payer: Self-pay | Admitting: Neurology

## 2023-09-14 NOTE — Telephone Encounter (Signed)
Pt wants to speak with someone about the results of her test.

## 2023-09-14 NOTE — Telephone Encounter (Signed)
Called Patient and went over results sending up front for scheduling a follow up

## 2023-09-23 ENCOUNTER — Ambulatory Visit: Payer: PPO | Admitting: Neurology

## 2023-10-06 DIAGNOSIS — I10 Essential (primary) hypertension: Secondary | ICD-10-CM | POA: Diagnosis not present

## 2023-10-06 DIAGNOSIS — E663 Overweight: Secondary | ICD-10-CM | POA: Diagnosis not present

## 2023-10-06 DIAGNOSIS — F419 Anxiety disorder, unspecified: Secondary | ICD-10-CM | POA: Diagnosis not present

## 2023-10-06 DIAGNOSIS — R8290 Unspecified abnormal findings in urine: Secondary | ICD-10-CM | POA: Diagnosis not present

## 2023-10-06 DIAGNOSIS — Z720 Tobacco use: Secondary | ICD-10-CM | POA: Diagnosis not present

## 2023-10-06 DIAGNOSIS — E1159 Type 2 diabetes mellitus with other circulatory complications: Secondary | ICD-10-CM | POA: Diagnosis not present

## 2023-10-06 DIAGNOSIS — E785 Hyperlipidemia, unspecified: Secondary | ICD-10-CM | POA: Diagnosis not present

## 2023-10-06 DIAGNOSIS — Z6829 Body mass index (BMI) 29.0-29.9, adult: Secondary | ICD-10-CM | POA: Diagnosis not present

## 2023-10-06 DIAGNOSIS — F32A Depression, unspecified: Secondary | ICD-10-CM | POA: Diagnosis not present

## 2023-10-06 DIAGNOSIS — Z794 Long term (current) use of insulin: Secondary | ICD-10-CM | POA: Diagnosis not present

## 2023-10-13 DIAGNOSIS — E785 Hyperlipidemia, unspecified: Secondary | ICD-10-CM | POA: Diagnosis not present

## 2023-10-13 DIAGNOSIS — E66811 Obesity, class 1: Secondary | ICD-10-CM | POA: Diagnosis not present

## 2023-10-13 DIAGNOSIS — F32A Depression, unspecified: Secondary | ICD-10-CM | POA: Diagnosis not present

## 2023-10-13 DIAGNOSIS — E1159 Type 2 diabetes mellitus with other circulatory complications: Secondary | ICD-10-CM | POA: Diagnosis not present

## 2023-10-13 DIAGNOSIS — Z6830 Body mass index (BMI) 30.0-30.9, adult: Secondary | ICD-10-CM | POA: Diagnosis not present

## 2023-10-13 DIAGNOSIS — R251 Tremor, unspecified: Secondary | ICD-10-CM | POA: Diagnosis not present

## 2023-10-13 DIAGNOSIS — F419 Anxiety disorder, unspecified: Secondary | ICD-10-CM | POA: Diagnosis not present

## 2023-10-13 DIAGNOSIS — R809 Proteinuria, unspecified: Secondary | ICD-10-CM | POA: Diagnosis not present

## 2023-10-13 DIAGNOSIS — E6609 Other obesity due to excess calories: Secondary | ICD-10-CM | POA: Diagnosis not present

## 2023-10-13 DIAGNOSIS — Z794 Long term (current) use of insulin: Secondary | ICD-10-CM | POA: Diagnosis not present

## 2023-10-13 DIAGNOSIS — I10 Essential (primary) hypertension: Secondary | ICD-10-CM | POA: Diagnosis not present

## 2023-10-28 DIAGNOSIS — Z6828 Body mass index (BMI) 28.0-28.9, adult: Secondary | ICD-10-CM | POA: Diagnosis not present

## 2023-10-28 DIAGNOSIS — R296 Repeated falls: Secondary | ICD-10-CM | POA: Diagnosis not present

## 2023-10-28 DIAGNOSIS — I10 Essential (primary) hypertension: Secondary | ICD-10-CM | POA: Diagnosis not present

## 2023-10-28 DIAGNOSIS — Z794 Long term (current) use of insulin: Secondary | ICD-10-CM | POA: Diagnosis not present

## 2023-10-28 DIAGNOSIS — F419 Anxiety disorder, unspecified: Secondary | ICD-10-CM | POA: Diagnosis not present

## 2023-10-28 DIAGNOSIS — S41119A Laceration without foreign body of unspecified upper arm, initial encounter: Secondary | ICD-10-CM | POA: Diagnosis not present

## 2023-10-28 DIAGNOSIS — E1159 Type 2 diabetes mellitus with other circulatory complications: Secondary | ICD-10-CM | POA: Diagnosis not present

## 2023-10-28 DIAGNOSIS — S0990XA Unspecified injury of head, initial encounter: Secondary | ICD-10-CM | POA: Diagnosis not present

## 2023-10-28 DIAGNOSIS — R2681 Unsteadiness on feet: Secondary | ICD-10-CM | POA: Diagnosis not present

## 2023-10-28 DIAGNOSIS — Z720 Tobacco use: Secondary | ICD-10-CM | POA: Diagnosis not present

## 2023-10-28 DIAGNOSIS — E785 Hyperlipidemia, unspecified: Secondary | ICD-10-CM | POA: Diagnosis not present

## 2023-10-28 DIAGNOSIS — R251 Tremor, unspecified: Secondary | ICD-10-CM | POA: Diagnosis not present

## 2023-10-29 DIAGNOSIS — S0990XA Unspecified injury of head, initial encounter: Secondary | ICD-10-CM | POA: Diagnosis not present

## 2023-10-29 DIAGNOSIS — R519 Headache, unspecified: Secondary | ICD-10-CM | POA: Diagnosis not present

## 2023-11-03 DIAGNOSIS — D649 Anemia, unspecified: Secondary | ICD-10-CM | POA: Diagnosis not present

## 2023-11-03 DIAGNOSIS — Z8673 Personal history of transient ischemic attack (TIA), and cerebral infarction without residual deficits: Secondary | ICD-10-CM | POA: Diagnosis not present

## 2023-11-03 DIAGNOSIS — I2510 Atherosclerotic heart disease of native coronary artery without angina pectoris: Secondary | ICD-10-CM | POA: Diagnosis not present

## 2023-11-03 DIAGNOSIS — E875 Hyperkalemia: Secondary | ICD-10-CM | POA: Diagnosis not present

## 2023-11-03 DIAGNOSIS — E119 Type 2 diabetes mellitus without complications: Secondary | ICD-10-CM | POA: Diagnosis not present

## 2023-11-03 DIAGNOSIS — M1990 Unspecified osteoarthritis, unspecified site: Secondary | ICD-10-CM | POA: Diagnosis not present

## 2023-11-03 DIAGNOSIS — Z880 Allergy status to penicillin: Secondary | ICD-10-CM | POA: Diagnosis not present

## 2023-11-03 DIAGNOSIS — S32591A Other specified fracture of right pubis, initial encounter for closed fracture: Secondary | ICD-10-CM | POA: Diagnosis not present

## 2023-11-03 DIAGNOSIS — W19XXXA Unspecified fall, initial encounter: Secondary | ICD-10-CM | POA: Diagnosis not present

## 2023-11-03 DIAGNOSIS — S72144A Nondisplaced intertrochanteric fracture of right femur, initial encounter for closed fracture: Secondary | ICD-10-CM | POA: Diagnosis not present

## 2023-11-03 DIAGNOSIS — Z7984 Long term (current) use of oral hypoglycemic drugs: Secondary | ICD-10-CM | POA: Diagnosis not present

## 2023-11-03 DIAGNOSIS — M25551 Pain in right hip: Secondary | ICD-10-CM | POA: Diagnosis not present

## 2023-11-03 DIAGNOSIS — S72141D Displaced intertrochanteric fracture of right femur, subsequent encounter for closed fracture with routine healing: Secondary | ICD-10-CM | POA: Diagnosis not present

## 2023-11-03 DIAGNOSIS — J9601 Acute respiratory failure with hypoxia: Secondary | ICD-10-CM | POA: Diagnosis not present

## 2023-11-03 DIAGNOSIS — Z888 Allergy status to other drugs, medicaments and biological substances status: Secondary | ICD-10-CM | POA: Diagnosis not present

## 2023-11-03 DIAGNOSIS — F17210 Nicotine dependence, cigarettes, uncomplicated: Secondary | ICD-10-CM | POA: Diagnosis not present

## 2023-11-03 DIAGNOSIS — I10 Essential (primary) hypertension: Secondary | ICD-10-CM | POA: Diagnosis not present

## 2023-11-03 DIAGNOSIS — Z79899 Other long term (current) drug therapy: Secondary | ICD-10-CM | POA: Diagnosis not present

## 2023-11-03 DIAGNOSIS — E871 Hypo-osmolality and hyponatremia: Secondary | ICD-10-CM | POA: Diagnosis not present

## 2023-11-03 DIAGNOSIS — F419 Anxiety disorder, unspecified: Secondary | ICD-10-CM | POA: Diagnosis not present

## 2023-11-03 DIAGNOSIS — N390 Urinary tract infection, site not specified: Secondary | ICD-10-CM | POA: Diagnosis not present

## 2023-11-03 DIAGNOSIS — M25572 Pain in left ankle and joints of left foot: Secondary | ICD-10-CM | POA: Diagnosis not present

## 2023-11-03 DIAGNOSIS — E7800 Pure hypercholesterolemia, unspecified: Secondary | ICD-10-CM | POA: Diagnosis not present

## 2023-11-03 DIAGNOSIS — N3000 Acute cystitis without hematuria: Secondary | ICD-10-CM | POA: Diagnosis not present

## 2023-11-03 DIAGNOSIS — S72141A Displaced intertrochanteric fracture of right femur, initial encounter for closed fracture: Secondary | ICD-10-CM | POA: Diagnosis not present

## 2023-11-03 DIAGNOSIS — Z794 Long term (current) use of insulin: Secondary | ICD-10-CM | POA: Diagnosis not present

## 2023-11-03 DIAGNOSIS — E559 Vitamin D deficiency, unspecified: Secondary | ICD-10-CM | POA: Diagnosis not present

## 2023-11-03 DIAGNOSIS — R531 Weakness: Secondary | ICD-10-CM | POA: Diagnosis not present

## 2023-11-03 DIAGNOSIS — M25451 Effusion, right hip: Secondary | ICD-10-CM | POA: Diagnosis not present

## 2023-11-03 DIAGNOSIS — R079 Chest pain, unspecified: Secondary | ICD-10-CM | POA: Diagnosis not present

## 2023-11-08 DIAGNOSIS — J449 Chronic obstructive pulmonary disease, unspecified: Secondary | ICD-10-CM | POA: Diagnosis not present

## 2023-11-10 DIAGNOSIS — F419 Anxiety disorder, unspecified: Secondary | ICD-10-CM | POA: Diagnosis not present

## 2023-11-10 DIAGNOSIS — I152 Hypertension secondary to endocrine disorders: Secondary | ICD-10-CM | POA: Diagnosis not present

## 2023-11-10 DIAGNOSIS — E875 Hyperkalemia: Secondary | ICD-10-CM | POA: Diagnosis not present

## 2023-11-10 DIAGNOSIS — E785 Hyperlipidemia, unspecified: Secondary | ICD-10-CM | POA: Diagnosis not present

## 2023-11-10 DIAGNOSIS — D649 Anemia, unspecified: Secondary | ICD-10-CM | POA: Diagnosis not present

## 2023-11-10 DIAGNOSIS — I519 Heart disease, unspecified: Secondary | ICD-10-CM | POA: Diagnosis not present

## 2023-11-10 DIAGNOSIS — E559 Vitamin D deficiency, unspecified: Secondary | ICD-10-CM | POA: Diagnosis not present

## 2023-11-10 DIAGNOSIS — S32591D Other specified fracture of right pubis, subsequent encounter for fracture with routine healing: Secondary | ICD-10-CM | POA: Diagnosis not present

## 2023-11-10 DIAGNOSIS — J9601 Acute respiratory failure with hypoxia: Secondary | ICD-10-CM | POA: Diagnosis not present

## 2023-11-10 DIAGNOSIS — E1159 Type 2 diabetes mellitus with other circulatory complications: Secondary | ICD-10-CM | POA: Diagnosis not present

## 2023-11-10 DIAGNOSIS — R251 Tremor, unspecified: Secondary | ICD-10-CM | POA: Diagnosis not present

## 2023-11-10 DIAGNOSIS — E871 Hypo-osmolality and hyponatremia: Secondary | ICD-10-CM | POA: Diagnosis not present

## 2023-11-10 DIAGNOSIS — I2510 Atherosclerotic heart disease of native coronary artery without angina pectoris: Secondary | ICD-10-CM | POA: Diagnosis not present

## 2023-11-10 DIAGNOSIS — N3000 Acute cystitis without hematuria: Secondary | ICD-10-CM | POA: Diagnosis not present

## 2023-11-10 DIAGNOSIS — F32A Depression, unspecified: Secondary | ICD-10-CM | POA: Diagnosis not present

## 2023-11-10 DIAGNOSIS — S61401D Unspecified open wound of right hand, subsequent encounter: Secondary | ICD-10-CM | POA: Diagnosis not present

## 2023-11-10 DIAGNOSIS — J449 Chronic obstructive pulmonary disease, unspecified: Secondary | ICD-10-CM | POA: Diagnosis not present

## 2023-11-10 DIAGNOSIS — F17210 Nicotine dependence, cigarettes, uncomplicated: Secondary | ICD-10-CM | POA: Diagnosis not present

## 2023-11-10 DIAGNOSIS — E663 Overweight: Secondary | ICD-10-CM | POA: Diagnosis not present

## 2023-11-10 DIAGNOSIS — E1169 Type 2 diabetes mellitus with other specified complication: Secondary | ICD-10-CM | POA: Diagnosis not present

## 2023-11-10 DIAGNOSIS — E1142 Type 2 diabetes mellitus with diabetic polyneuropathy: Secondary | ICD-10-CM | POA: Diagnosis not present

## 2023-11-10 DIAGNOSIS — S72141D Displaced intertrochanteric fracture of right femur, subsequent encounter for closed fracture with routine healing: Secondary | ICD-10-CM | POA: Diagnosis not present

## 2023-11-10 DIAGNOSIS — S0990XD Unspecified injury of head, subsequent encounter: Secondary | ICD-10-CM | POA: Diagnosis not present

## 2023-11-10 DIAGNOSIS — M160 Bilateral primary osteoarthritis of hip: Secondary | ICD-10-CM | POA: Diagnosis not present

## 2023-11-11 DIAGNOSIS — E1159 Type 2 diabetes mellitus with other circulatory complications: Secondary | ICD-10-CM | POA: Diagnosis not present

## 2023-11-11 DIAGNOSIS — Z720 Tobacco use: Secondary | ICD-10-CM | POA: Diagnosis not present

## 2023-11-11 DIAGNOSIS — S32599A Other specified fracture of unspecified pubis, initial encounter for closed fracture: Secondary | ICD-10-CM | POA: Diagnosis not present

## 2023-11-11 DIAGNOSIS — N390 Urinary tract infection, site not specified: Secondary | ICD-10-CM | POA: Diagnosis not present

## 2023-11-11 DIAGNOSIS — Z760 Encounter for issue of repeat prescription: Secondary | ICD-10-CM | POA: Diagnosis not present

## 2023-11-11 DIAGNOSIS — Z794 Long term (current) use of insulin: Secondary | ICD-10-CM | POA: Diagnosis not present

## 2023-11-11 DIAGNOSIS — Z09 Encounter for follow-up examination after completed treatment for conditions other than malignant neoplasm: Secondary | ICD-10-CM | POA: Diagnosis not present

## 2023-11-11 DIAGNOSIS — S7291XA Unspecified fracture of right femur, initial encounter for closed fracture: Secondary | ICD-10-CM | POA: Diagnosis not present

## 2023-11-17 DIAGNOSIS — Z794 Long term (current) use of insulin: Secondary | ICD-10-CM | POA: Diagnosis not present

## 2023-11-17 DIAGNOSIS — E1159 Type 2 diabetes mellitus with other circulatory complications: Secondary | ICD-10-CM | POA: Diagnosis not present

## 2023-11-17 DIAGNOSIS — Z6827 Body mass index (BMI) 27.0-27.9, adult: Secondary | ICD-10-CM | POA: Diagnosis not present

## 2023-11-17 DIAGNOSIS — I10 Essential (primary) hypertension: Secondary | ICD-10-CM | POA: Diagnosis not present

## 2023-11-17 DIAGNOSIS — E663 Overweight: Secondary | ICD-10-CM | POA: Diagnosis not present

## 2023-11-26 DIAGNOSIS — M25551 Pain in right hip: Secondary | ICD-10-CM | POA: Diagnosis not present

## 2023-11-26 DIAGNOSIS — M898X5 Other specified disorders of bone, thigh: Secondary | ICD-10-CM | POA: Diagnosis not present

## 2023-11-30 DIAGNOSIS — E1159 Type 2 diabetes mellitus with other circulatory complications: Secondary | ICD-10-CM | POA: Diagnosis not present

## 2023-11-30 DIAGNOSIS — Z720 Tobacco use: Secondary | ICD-10-CM | POA: Diagnosis not present

## 2023-11-30 DIAGNOSIS — I2510 Atherosclerotic heart disease of native coronary artery without angina pectoris: Secondary | ICD-10-CM | POA: Diagnosis not present

## 2023-11-30 DIAGNOSIS — J449 Chronic obstructive pulmonary disease, unspecified: Secondary | ICD-10-CM | POA: Diagnosis not present

## 2023-11-30 DIAGNOSIS — I10 Essential (primary) hypertension: Secondary | ICD-10-CM | POA: Diagnosis not present

## 2023-11-30 DIAGNOSIS — Z794 Long term (current) use of insulin: Secondary | ICD-10-CM | POA: Diagnosis not present

## 2023-11-30 DIAGNOSIS — Z6826 Body mass index (BMI) 26.0-26.9, adult: Secondary | ICD-10-CM | POA: Diagnosis not present

## 2023-11-30 DIAGNOSIS — E663 Overweight: Secondary | ICD-10-CM | POA: Diagnosis not present

## 2023-11-30 DIAGNOSIS — R3989 Other symptoms and signs involving the genitourinary system: Secondary | ICD-10-CM | POA: Diagnosis not present

## 2023-12-08 NOTE — Progress Notes (Deleted)
 Assessment/Plan:    1.  Tremor  ***Depakote  has been discontinued.  - We discussed talking to her primary care nurse practitioner to see if her nadolol  could be changed over to propranolol  and increase the dose.  Primidone could be an option as well.  I really do not like to change other providers medication  Subjective:   Sara Lamb was seen today in follow up for essential tremor.  My previous records were reviewed prior to todays visit.  Depakote  was stopped last visit.  We discussed last visit that this medication can contribute to tremor.  She had a DaTscan  completed August 05, 2023.  That was normal.  Pt denies falls.  We had discussed last visit that her gait instability was not slowly neurologic and she had related to me that she had walked with a walker for years, primarily because of orthopedic issues.  Current prescribed movement disorder medications: ***   PREVIOUS MEDICATIONS: {Parkinson's RX:18200}  ALLERGIES:   Allergies  Allergen Reactions   Actos  [Pioglitazone ] Other (See Comments)    Patient stated that it elevated her BGL   Glyburide Other (See Comments)    Patient stated that it elevated her BGL   Metformin  And Related     "metformin  caused mini-strokes" and ELEVATED BS and also GLYBURIDE, ONGLYZA, ACTOS , INVOKANA  caused elevated BS   Onglyza [Saxagliptin ] Other (See Comments)    Patient stated that it elevated her BGL   Rosuvastatin  Diarrhea   Insulins Rash   Janumet [Sitagliptin Phos-Metformin  Hcl] Rash and Other (See Comments)    Shakes/ rash   Penicillins Rash and Other (See Comments)   Spironolactone  Rash    CURRENT MEDICATIONS:  No outpatient medications have been marked as taking for the 12/10/23 encounter (Appointment) with Rejina Odle, [place], DO.      Objective:    PHYSICAL EXAMINATION:    VITALS:  There were no vitals filed for this visit.  GEN:  The patient appears stated age and is in NAD. HEENT:  Normocephalic, atraumatic.  The  mucous membranes are moist. The superficial temporal arteries are without ropiness or tenderness. CV:  RRR Lungs:  CTAB Neck/HEME:  There are no carotid bruits bilaterally.  Neurological examination:  Orientation: The patient is alert and oriented x3. Cranial nerves: There is good facial symmetry. The speech is fluent and clear. Soft palate rises symmetrically and there is no tongue deviation. Hearing is intact to conversational tone. Sensation: Sensation is intact to light touch throughout Motor: Strength is 5/5 in the bilateral upper and L lower extremities.  Strength in the RLE is at least 4+-5-/5 with giveway weakness that improves with encouragement.   Shoulder shrug is equal and symmetric.  There is no pronator drift.   Movement examination: Tone: There is normal tone in the UE/LE Abnormal movements: ***The on the right tremor that is seen is when she holds her walker on the right.  No postural tremor.  She has mild tremor with Archimedes spirals on the right. Coordination:  There is *** decremation with RAM's, *** Gait and Station: The patient pushes off to arise.  She is slow but ambulates fairly well with a walker.  She does not shuffle. I have reviewed and interpreted the following labs independently   Chemistry      Component Value Date/Time   NA 135 07/07/2023 1602   NA 134 07/15/2022 1531   K 4.7 07/07/2023 1602   CL 99 07/07/2023 1602   CO2 30 07/07/2023 1602  BUN 23 07/07/2023 1602   BUN 16 07/15/2022 1531   CREATININE 1.02 (H) 07/07/2023 1602      Component Value Date/Time   CALCIUM  9.1 07/07/2023 1602   ALKPHOS 110 06/09/2022 1348   AST 18 07/07/2023 1602   ALT 10 07/07/2023 1602   BILITOT 0.3 07/07/2023 1602      Lab Results  Component Value Date   WBC 9.5 07/07/2023   HGB 12.7 07/07/2023   HCT 39.4 07/07/2023   MCV 92.9 07/07/2023   PLT 224 07/07/2023   Lab Results  Component Value Date   TSH 1.09 07/07/2023     Chemistry      Component Value  Date/Time   NA 135 07/07/2023 1602   NA 134 07/15/2022 1531   K 4.7 07/07/2023 1602   CL 99 07/07/2023 1602   CO2 30 07/07/2023 1602   BUN 23 07/07/2023 1602   BUN 16 07/15/2022 1531   CREATININE 1.02 (H) 07/07/2023 1602      Component Value Date/Time   CALCIUM  9.1 07/07/2023 1602   ALKPHOS 110 06/09/2022 1348   AST 18 07/07/2023 1602   ALT 10 07/07/2023 1602   BILITOT 0.3 07/07/2023 1602         Total time spent on today's visit was ***30 minutes, including both face-to-face time and nonface-to-face time.  Time included that spent on review of records (prior notes available to me/labs/imaging if pertinent), discussing treatment and goals, answering patient's questions and coordinating care.  Cc:  Vangie Genet, MD

## 2023-12-10 ENCOUNTER — Ambulatory Visit: Payer: PPO | Admitting: Neurology

## 2023-12-13 ENCOUNTER — Other Ambulatory Visit: Payer: Self-pay | Admitting: Family

## 2023-12-13 DIAGNOSIS — E1140 Type 2 diabetes mellitus with diabetic neuropathy, unspecified: Secondary | ICD-10-CM

## 2024-01-11 DIAGNOSIS — F419 Anxiety disorder, unspecified: Secondary | ICD-10-CM | POA: Diagnosis not present

## 2024-01-11 DIAGNOSIS — E1159 Type 2 diabetes mellitus with other circulatory complications: Secondary | ICD-10-CM | POA: Diagnosis not present

## 2024-01-11 DIAGNOSIS — I2510 Atherosclerotic heart disease of native coronary artery without angina pectoris: Secondary | ICD-10-CM | POA: Diagnosis not present

## 2024-01-11 DIAGNOSIS — Z794 Long term (current) use of insulin: Secondary | ICD-10-CM | POA: Diagnosis not present

## 2024-01-11 DIAGNOSIS — E663 Overweight: Secondary | ICD-10-CM | POA: Diagnosis not present

## 2024-01-11 DIAGNOSIS — Z720 Tobacco use: Secondary | ICD-10-CM | POA: Diagnosis not present

## 2024-01-11 DIAGNOSIS — Z6826 Body mass index (BMI) 26.0-26.9, adult: Secondary | ICD-10-CM | POA: Diagnosis not present

## 2024-01-11 DIAGNOSIS — J988 Other specified respiratory disorders: Secondary | ICD-10-CM | POA: Diagnosis not present

## 2024-01-11 DIAGNOSIS — E785 Hyperlipidemia, unspecified: Secondary | ICD-10-CM | POA: Diagnosis not present

## 2024-01-11 DIAGNOSIS — F32A Depression, unspecified: Secondary | ICD-10-CM | POA: Diagnosis not present

## 2024-01-11 DIAGNOSIS — I10 Essential (primary) hypertension: Secondary | ICD-10-CM | POA: Diagnosis not present

## 2024-03-29 DIAGNOSIS — Z794 Long term (current) use of insulin: Secondary | ICD-10-CM | POA: Diagnosis not present

## 2024-03-29 DIAGNOSIS — F330 Major depressive disorder, recurrent, mild: Secondary | ICD-10-CM | POA: Diagnosis not present

## 2024-03-29 DIAGNOSIS — E1122 Type 2 diabetes mellitus with diabetic chronic kidney disease: Secondary | ICD-10-CM | POA: Diagnosis not present

## 2024-03-29 DIAGNOSIS — E1142 Type 2 diabetes mellitus with diabetic polyneuropathy: Secondary | ICD-10-CM | POA: Diagnosis not present

## 2024-03-29 DIAGNOSIS — F419 Anxiety disorder, unspecified: Secondary | ICD-10-CM | POA: Diagnosis not present

## 2024-03-29 DIAGNOSIS — F17210 Nicotine dependence, cigarettes, uncomplicated: Secondary | ICD-10-CM | POA: Diagnosis not present

## 2024-03-29 DIAGNOSIS — E785 Hyperlipidemia, unspecified: Secondary | ICD-10-CM | POA: Diagnosis not present

## 2024-03-29 DIAGNOSIS — E663 Overweight: Secondary | ICD-10-CM | POA: Diagnosis not present

## 2024-03-29 DIAGNOSIS — E1165 Type 2 diabetes mellitus with hyperglycemia: Secondary | ICD-10-CM | POA: Diagnosis not present

## 2024-03-29 DIAGNOSIS — D8481 Immunodeficiency due to conditions classified elsewhere: Secondary | ICD-10-CM | POA: Diagnosis not present

## 2024-03-29 DIAGNOSIS — G250 Essential tremor: Secondary | ICD-10-CM | POA: Diagnosis not present

## 2024-03-29 DIAGNOSIS — E1169 Type 2 diabetes mellitus with other specified complication: Secondary | ICD-10-CM | POA: Diagnosis not present

## 2024-04-12 DIAGNOSIS — Z6824 Body mass index (BMI) 24.0-24.9, adult: Secondary | ICD-10-CM | POA: Diagnosis not present

## 2024-04-12 DIAGNOSIS — Z720 Tobacco use: Secondary | ICD-10-CM | POA: Diagnosis not present

## 2024-04-12 DIAGNOSIS — I10 Essential (primary) hypertension: Secondary | ICD-10-CM | POA: Diagnosis not present

## 2024-04-12 DIAGNOSIS — E785 Hyperlipidemia, unspecified: Secondary | ICD-10-CM | POA: Diagnosis not present

## 2024-04-12 DIAGNOSIS — R200 Anesthesia of skin: Secondary | ICD-10-CM | POA: Diagnosis not present

## 2024-04-12 DIAGNOSIS — Z794 Long term (current) use of insulin: Secondary | ICD-10-CM | POA: Diagnosis not present

## 2024-04-12 DIAGNOSIS — Z8673 Personal history of transient ischemic attack (TIA), and cerebral infarction without residual deficits: Secondary | ICD-10-CM | POA: Diagnosis not present

## 2024-04-12 DIAGNOSIS — J449 Chronic obstructive pulmonary disease, unspecified: Secondary | ICD-10-CM | POA: Diagnosis not present

## 2024-04-12 DIAGNOSIS — E1159 Type 2 diabetes mellitus with other circulatory complications: Secondary | ICD-10-CM | POA: Diagnosis not present

## 2024-04-12 DIAGNOSIS — F32A Depression, unspecified: Secondary | ICD-10-CM | POA: Diagnosis not present

## 2024-04-12 DIAGNOSIS — R600 Localized edema: Secondary | ICD-10-CM | POA: Diagnosis not present

## 2024-04-12 DIAGNOSIS — F419 Anxiety disorder, unspecified: Secondary | ICD-10-CM | POA: Diagnosis not present

## 2024-04-13 DIAGNOSIS — R200 Anesthesia of skin: Secondary | ICD-10-CM | POA: Diagnosis not present

## 2024-04-13 DIAGNOSIS — Z8673 Personal history of transient ischemic attack (TIA), and cerebral infarction without residual deficits: Secondary | ICD-10-CM | POA: Diagnosis not present

## 2024-04-19 DIAGNOSIS — Z6825 Body mass index (BMI) 25.0-25.9, adult: Secondary | ICD-10-CM | POA: Diagnosis not present

## 2024-04-19 DIAGNOSIS — E663 Overweight: Secondary | ICD-10-CM | POA: Diagnosis not present

## 2024-04-19 DIAGNOSIS — I872 Venous insufficiency (chronic) (peripheral): Secondary | ICD-10-CM | POA: Diagnosis not present

## 2024-04-19 DIAGNOSIS — Z8673 Personal history of transient ischemic attack (TIA), and cerebral infarction without residual deficits: Secondary | ICD-10-CM | POA: Diagnosis not present

## 2024-04-19 DIAGNOSIS — E785 Hyperlipidemia, unspecified: Secondary | ICD-10-CM | POA: Diagnosis not present

## 2024-04-19 DIAGNOSIS — I739 Peripheral vascular disease, unspecified: Secondary | ICD-10-CM | POA: Diagnosis not present

## 2024-04-19 DIAGNOSIS — R600 Localized edema: Secondary | ICD-10-CM | POA: Diagnosis not present

## 2024-04-19 DIAGNOSIS — E1159 Type 2 diabetes mellitus with other circulatory complications: Secondary | ICD-10-CM | POA: Diagnosis not present

## 2024-04-19 DIAGNOSIS — N1831 Chronic kidney disease, stage 3a: Secondary | ICD-10-CM | POA: Diagnosis not present

## 2024-04-19 DIAGNOSIS — Z794 Long term (current) use of insulin: Secondary | ICD-10-CM | POA: Diagnosis not present

## 2024-04-19 DIAGNOSIS — I10 Essential (primary) hypertension: Secondary | ICD-10-CM | POA: Diagnosis not present

## 2024-04-19 DIAGNOSIS — R8290 Unspecified abnormal findings in urine: Secondary | ICD-10-CM | POA: Diagnosis not present

## 2024-04-27 DIAGNOSIS — I10 Essential (primary) hypertension: Secondary | ICD-10-CM | POA: Diagnosis not present

## 2024-05-03 DIAGNOSIS — I119 Hypertensive heart disease without heart failure: Secondary | ICD-10-CM | POA: Diagnosis not present

## 2024-05-03 DIAGNOSIS — E1159 Type 2 diabetes mellitus with other circulatory complications: Secondary | ICD-10-CM | POA: Diagnosis not present

## 2024-05-03 DIAGNOSIS — E663 Overweight: Secondary | ICD-10-CM | POA: Diagnosis not present

## 2024-05-03 DIAGNOSIS — Z6826 Body mass index (BMI) 26.0-26.9, adult: Secondary | ICD-10-CM | POA: Diagnosis not present

## 2024-05-03 DIAGNOSIS — I10 Essential (primary) hypertension: Secondary | ICD-10-CM | POA: Diagnosis not present

## 2024-05-03 DIAGNOSIS — I050 Rheumatic mitral stenosis: Secondary | ICD-10-CM | POA: Diagnosis not present

## 2024-05-03 DIAGNOSIS — R600 Localized edema: Secondary | ICD-10-CM | POA: Diagnosis not present

## 2024-05-03 DIAGNOSIS — I872 Venous insufficiency (chronic) (peripheral): Secondary | ICD-10-CM | POA: Diagnosis not present

## 2024-05-03 DIAGNOSIS — Z794 Long term (current) use of insulin: Secondary | ICD-10-CM | POA: Diagnosis not present

## 2024-05-03 DIAGNOSIS — E785 Hyperlipidemia, unspecified: Secondary | ICD-10-CM | POA: Diagnosis not present

## 2024-05-17 DIAGNOSIS — Z6826 Body mass index (BMI) 26.0-26.9, adult: Secondary | ICD-10-CM | POA: Diagnosis not present

## 2024-05-17 DIAGNOSIS — Z794 Long term (current) use of insulin: Secondary | ICD-10-CM | POA: Diagnosis not present

## 2024-05-17 DIAGNOSIS — I10 Essential (primary) hypertension: Secondary | ICD-10-CM | POA: Diagnosis not present

## 2024-05-17 DIAGNOSIS — I050 Rheumatic mitral stenosis: Secondary | ICD-10-CM | POA: Diagnosis not present

## 2024-05-17 DIAGNOSIS — Z760 Encounter for issue of repeat prescription: Secondary | ICD-10-CM | POA: Diagnosis not present

## 2024-05-17 DIAGNOSIS — R600 Localized edema: Secondary | ICD-10-CM | POA: Diagnosis not present

## 2024-05-17 DIAGNOSIS — E1159 Type 2 diabetes mellitus with other circulatory complications: Secondary | ICD-10-CM | POA: Diagnosis not present

## 2024-05-19 DIAGNOSIS — I050 Rheumatic mitral stenosis: Secondary | ICD-10-CM | POA: Diagnosis not present

## 2024-05-19 DIAGNOSIS — I340 Nonrheumatic mitral (valve) insufficiency: Secondary | ICD-10-CM | POA: Diagnosis not present

## 2024-05-19 DIAGNOSIS — Z720 Tobacco use: Secondary | ICD-10-CM | POA: Diagnosis not present

## 2024-05-19 DIAGNOSIS — I5030 Unspecified diastolic (congestive) heart failure: Secondary | ICD-10-CM | POA: Diagnosis not present

## 2024-05-19 DIAGNOSIS — I10 Essential (primary) hypertension: Secondary | ICD-10-CM | POA: Diagnosis not present

## 2024-05-24 DIAGNOSIS — I050 Rheumatic mitral stenosis: Secondary | ICD-10-CM | POA: Diagnosis not present

## 2024-05-25 DIAGNOSIS — Z720 Tobacco use: Secondary | ICD-10-CM | POA: Diagnosis not present

## 2024-05-25 DIAGNOSIS — I050 Rheumatic mitral stenosis: Secondary | ICD-10-CM | POA: Diagnosis not present

## 2024-05-25 DIAGNOSIS — I10 Essential (primary) hypertension: Secondary | ICD-10-CM | POA: Diagnosis not present

## 2024-05-25 DIAGNOSIS — I5030 Unspecified diastolic (congestive) heart failure: Secondary | ICD-10-CM | POA: Diagnosis not present

## 2024-05-25 DIAGNOSIS — I340 Nonrheumatic mitral (valve) insufficiency: Secondary | ICD-10-CM | POA: Diagnosis not present

## 2024-05-31 DIAGNOSIS — E1142 Type 2 diabetes mellitus with diabetic polyneuropathy: Secondary | ICD-10-CM | POA: Diagnosis not present

## 2024-05-31 DIAGNOSIS — I272 Pulmonary hypertension, unspecified: Secondary | ICD-10-CM | POA: Diagnosis not present

## 2024-05-31 DIAGNOSIS — I110 Hypertensive heart disease with heart failure: Secondary | ICD-10-CM | POA: Diagnosis not present

## 2024-05-31 DIAGNOSIS — Z880 Allergy status to penicillin: Secondary | ICD-10-CM | POA: Diagnosis not present

## 2024-05-31 DIAGNOSIS — Z79899 Other long term (current) drug therapy: Secondary | ICD-10-CM | POA: Diagnosis not present

## 2024-05-31 DIAGNOSIS — I2510 Atherosclerotic heart disease of native coronary artery without angina pectoris: Secondary | ICD-10-CM | POA: Diagnosis not present

## 2024-05-31 DIAGNOSIS — E1122 Type 2 diabetes mellitus with diabetic chronic kidney disease: Secondary | ICD-10-CM | POA: Diagnosis not present

## 2024-05-31 DIAGNOSIS — F17210 Nicotine dependence, cigarettes, uncomplicated: Secondary | ICD-10-CM | POA: Diagnosis not present

## 2024-05-31 DIAGNOSIS — I130 Hypertensive heart and chronic kidney disease with heart failure and stage 1 through stage 4 chronic kidney disease, or unspecified chronic kidney disease: Secondary | ICD-10-CM | POA: Diagnosis not present

## 2024-05-31 DIAGNOSIS — I052 Rheumatic mitral stenosis with insufficiency: Secondary | ICD-10-CM | POA: Diagnosis not present

## 2024-05-31 DIAGNOSIS — E1151 Type 2 diabetes mellitus with diabetic peripheral angiopathy without gangrene: Secondary | ICD-10-CM | POA: Diagnosis not present

## 2024-05-31 DIAGNOSIS — Z7901 Long term (current) use of anticoagulants: Secondary | ICD-10-CM | POA: Diagnosis not present

## 2024-05-31 DIAGNOSIS — I5032 Chronic diastolic (congestive) heart failure: Secondary | ICD-10-CM | POA: Diagnosis not present

## 2024-05-31 DIAGNOSIS — I050 Rheumatic mitral stenosis: Secondary | ICD-10-CM | POA: Diagnosis not present

## 2024-05-31 DIAGNOSIS — Z794 Long term (current) use of insulin: Secondary | ICD-10-CM | POA: Diagnosis not present

## 2024-05-31 DIAGNOSIS — Z886 Allergy status to analgesic agent status: Secondary | ICD-10-CM | POA: Diagnosis not present

## 2024-05-31 DIAGNOSIS — N1831 Chronic kidney disease, stage 3a: Secondary | ICD-10-CM | POA: Diagnosis not present

## 2024-05-31 DIAGNOSIS — I509 Heart failure, unspecified: Secondary | ICD-10-CM | POA: Diagnosis not present

## 2024-05-31 DIAGNOSIS — E66811 Obesity, class 1: Secondary | ICD-10-CM | POA: Diagnosis not present

## 2024-05-31 DIAGNOSIS — Z888 Allergy status to other drugs, medicaments and biological substances status: Secondary | ICD-10-CM | POA: Diagnosis not present

## 2024-05-31 DIAGNOSIS — Z6830 Body mass index (BMI) 30.0-30.9, adult: Secondary | ICD-10-CM | POA: Diagnosis not present

## 2024-05-31 DIAGNOSIS — Z8673 Personal history of transient ischemic attack (TIA), and cerebral infarction without residual deficits: Secondary | ICD-10-CM | POA: Diagnosis not present

## 2024-05-31 DIAGNOSIS — J449 Chronic obstructive pulmonary disease, unspecified: Secondary | ICD-10-CM | POA: Diagnosis not present

## 2024-05-31 DIAGNOSIS — E785 Hyperlipidemia, unspecified: Secondary | ICD-10-CM | POA: Diagnosis not present

## 2024-06-08 DIAGNOSIS — I050 Rheumatic mitral stenosis: Secondary | ICD-10-CM | POA: Diagnosis not present

## 2024-06-08 DIAGNOSIS — I5030 Unspecified diastolic (congestive) heart failure: Secondary | ICD-10-CM | POA: Diagnosis not present

## 2024-06-08 DIAGNOSIS — I340 Nonrheumatic mitral (valve) insufficiency: Secondary | ICD-10-CM | POA: Diagnosis not present

## 2024-06-08 DIAGNOSIS — Z720 Tobacco use: Secondary | ICD-10-CM | POA: Diagnosis not present

## 2024-06-08 DIAGNOSIS — I10 Essential (primary) hypertension: Secondary | ICD-10-CM | POA: Diagnosis not present

## 2024-06-27 DIAGNOSIS — I5030 Unspecified diastolic (congestive) heart failure: Secondary | ICD-10-CM | POA: Diagnosis not present

## 2024-06-27 DIAGNOSIS — I050 Rheumatic mitral stenosis: Secondary | ICD-10-CM | POA: Diagnosis not present

## 2024-06-27 DIAGNOSIS — I10 Essential (primary) hypertension: Secondary | ICD-10-CM | POA: Diagnosis not present

## 2024-07-05 ENCOUNTER — Encounter: Payer: PPO | Admitting: Nurse Practitioner

## 2024-07-10 ENCOUNTER — Encounter: Payer: PPO | Admitting: Nurse Practitioner
